# Patient Record
Sex: Female | Born: 1940
Health system: Southern US, Community
[De-identification: ages and names within clinical notes are randomized; demographics above are authoritative.]

## PROBLEM LIST (undated history)

## (undated) DIAGNOSIS — C55 Malignant neoplasm of uterus, part unspecified: Secondary | ICD-10-CM

## (undated) DIAGNOSIS — T7840XA Allergy, unspecified, initial encounter: Secondary | ICD-10-CM

## (undated) DIAGNOSIS — E785 Hyperlipidemia, unspecified: Secondary | ICD-10-CM

## (undated) DIAGNOSIS — I10 Essential (primary) hypertension: Secondary | ICD-10-CM

## (undated) DIAGNOSIS — R011 Cardiac murmur, unspecified: Secondary | ICD-10-CM

## (undated) DIAGNOSIS — I351 Nonrheumatic aortic (valve) insufficiency: Secondary | ICD-10-CM

## (undated) DIAGNOSIS — D126 Benign neoplasm of colon, unspecified: Secondary | ICD-10-CM

## (undated) HISTORY — PX: GLAUCOMA SURGERY: SHX656

## (undated) HISTORY — PX: BLADDER SUSPENSION: SHX72

## (undated) HISTORY — DX: Essential (primary) hypertension: I10

## (undated) HISTORY — PX: COLONOSCOPY: SHX174

## (undated) HISTORY — DX: Allergy, unspecified, initial encounter: T78.40XA

## (undated) HISTORY — DX: Hyperlipidemia, unspecified: E78.5

## (undated) HISTORY — DX: Cardiac murmur, unspecified: R01.1

## (undated) HISTORY — PX: INTRAOCULAR LENS INSERTION: SHX110

## (undated) HISTORY — DX: Malignant neoplasm of uterus, part unspecified: C55

## (undated) HISTORY — PX: CARPAL TUNNEL RELEASE: SHX101

## (undated) HISTORY — DX: Nonrheumatic aortic (valve) insufficiency: I35.1

## (undated) HISTORY — DX: Benign neoplasm of colon, unspecified: D12.6

## (undated) HISTORY — PX: TOTAL ABDOMINAL HYSTERECTOMY W/ BILATERAL SALPINGOOPHORECTOMY: SHX83

---

## 1998-04-08 ENCOUNTER — Other Ambulatory Visit: Admission: RE | Admit: 1998-04-08 | Discharge: 1998-04-08 | Payer: Self-pay | Admitting: Obstetrics & Gynecology

## 1998-11-25 ENCOUNTER — Other Ambulatory Visit: Admission: RE | Admit: 1998-11-25 | Discharge: 1998-11-25 | Payer: Self-pay | Admitting: Obstetrics and Gynecology

## 1999-06-02 ENCOUNTER — Other Ambulatory Visit: Admission: RE | Admit: 1999-06-02 | Discharge: 1999-06-02 | Payer: Self-pay | Admitting: Obstetrics and Gynecology

## 2000-06-06 ENCOUNTER — Other Ambulatory Visit: Admission: RE | Admit: 2000-06-06 | Discharge: 2000-06-06 | Payer: Self-pay | Admitting: Obstetrics and Gynecology

## 2001-07-04 ENCOUNTER — Other Ambulatory Visit: Admission: RE | Admit: 2001-07-04 | Discharge: 2001-07-04 | Payer: Self-pay | Admitting: Obstetrics and Gynecology

## 2002-08-18 ENCOUNTER — Other Ambulatory Visit: Admission: RE | Admit: 2002-08-18 | Discharge: 2002-08-18 | Payer: Self-pay | Admitting: Obstetrics and Gynecology

## 2002-12-11 ENCOUNTER — Ambulatory Visit (HOSPITAL_BASED_OUTPATIENT_CLINIC_OR_DEPARTMENT_OTHER): Admission: RE | Admit: 2002-12-11 | Discharge: 2002-12-11 | Payer: Self-pay | Admitting: Orthopedic Surgery

## 2002-12-11 ENCOUNTER — Ambulatory Visit (HOSPITAL_COMMUNITY): Admission: RE | Admit: 2002-12-11 | Discharge: 2002-12-11 | Payer: Self-pay | Admitting: Orthopedic Surgery

## 2003-02-28 DIAGNOSIS — D126 Benign neoplasm of colon, unspecified: Secondary | ICD-10-CM

## 2003-02-28 HISTORY — DX: Benign neoplasm of colon, unspecified: D12.6

## 2003-08-11 ENCOUNTER — Other Ambulatory Visit: Admission: RE | Admit: 2003-08-11 | Discharge: 2003-08-11 | Payer: Self-pay | Admitting: Obstetrics and Gynecology

## 2004-08-05 ENCOUNTER — Ambulatory Visit: Payer: Self-pay | Admitting: Internal Medicine

## 2004-08-18 ENCOUNTER — Other Ambulatory Visit: Admission: RE | Admit: 2004-08-18 | Discharge: 2004-08-18 | Payer: Self-pay | Admitting: Obstetrics and Gynecology

## 2004-12-23 ENCOUNTER — Ambulatory Visit: Payer: Self-pay | Admitting: Internal Medicine

## 2005-11-06 ENCOUNTER — Ambulatory Visit: Payer: Self-pay | Admitting: Internal Medicine

## 2005-12-27 ENCOUNTER — Ambulatory Visit: Payer: Self-pay | Admitting: Internal Medicine

## 2006-06-05 ENCOUNTER — Ambulatory Visit (HOSPITAL_COMMUNITY): Admission: RE | Admit: 2006-06-05 | Discharge: 2006-06-05 | Payer: Self-pay | Admitting: Obstetrics and Gynecology

## 2006-12-14 ENCOUNTER — Ambulatory Visit: Payer: Self-pay | Admitting: Internal Medicine

## 2006-12-14 DIAGNOSIS — I10 Essential (primary) hypertension: Secondary | ICD-10-CM

## 2006-12-14 DIAGNOSIS — J301 Allergic rhinitis due to pollen: Secondary | ICD-10-CM

## 2006-12-14 DIAGNOSIS — N3946 Mixed incontinence: Secondary | ICD-10-CM | POA: Insufficient documentation

## 2006-12-14 DIAGNOSIS — E782 Mixed hyperlipidemia: Secondary | ICD-10-CM | POA: Insufficient documentation

## 2006-12-25 ENCOUNTER — Encounter (INDEPENDENT_AMBULATORY_CARE_PROVIDER_SITE_OTHER): Payer: Self-pay | Admitting: *Deleted

## 2007-03-21 ENCOUNTER — Encounter: Payer: Self-pay | Admitting: Internal Medicine

## 2007-11-25 ENCOUNTER — Telehealth (INDEPENDENT_AMBULATORY_CARE_PROVIDER_SITE_OTHER): Payer: Self-pay | Admitting: *Deleted

## 2007-12-04 ENCOUNTER — Telehealth (INDEPENDENT_AMBULATORY_CARE_PROVIDER_SITE_OTHER): Payer: Self-pay | Admitting: *Deleted

## 2007-12-31 ENCOUNTER — Ambulatory Visit: Payer: Self-pay | Admitting: Internal Medicine

## 2007-12-31 LAB — CONVERTED CEMR LAB
AST: 27 units/L (ref 0–37)
Albumin: 3.9 g/dL (ref 3.5–5.2)
Alkaline Phosphatase: 52 units/L (ref 39–117)
Cholesterol: 150 mg/dL (ref 0–200)
Total Protein: 6.9 g/dL (ref 6.0–8.3)
Triglycerides: 71 mg/dL (ref 0–149)

## 2008-01-07 ENCOUNTER — Ambulatory Visit: Payer: Self-pay | Admitting: Internal Medicine

## 2008-01-07 DIAGNOSIS — Z860100 Personal history of colon polyps, unspecified: Secondary | ICD-10-CM | POA: Insufficient documentation

## 2008-01-07 DIAGNOSIS — R9431 Abnormal electrocardiogram [ECG] [EKG]: Secondary | ICD-10-CM

## 2008-01-07 DIAGNOSIS — R7303 Prediabetes: Secondary | ICD-10-CM | POA: Insufficient documentation

## 2008-03-24 ENCOUNTER — Encounter: Payer: Self-pay | Admitting: Internal Medicine

## 2008-05-25 ENCOUNTER — Ambulatory Visit: Payer: Self-pay | Admitting: Internal Medicine

## 2008-09-03 ENCOUNTER — Encounter (INDEPENDENT_AMBULATORY_CARE_PROVIDER_SITE_OTHER): Payer: Self-pay | Admitting: *Deleted

## 2008-11-06 ENCOUNTER — Telehealth (INDEPENDENT_AMBULATORY_CARE_PROVIDER_SITE_OTHER): Payer: Self-pay | Admitting: *Deleted

## 2008-12-16 ENCOUNTER — Ambulatory Visit: Payer: Self-pay | Admitting: Internal Medicine

## 2009-01-01 ENCOUNTER — Encounter (INDEPENDENT_AMBULATORY_CARE_PROVIDER_SITE_OTHER): Payer: Self-pay | Admitting: *Deleted

## 2009-01-19 ENCOUNTER — Encounter (INDEPENDENT_AMBULATORY_CARE_PROVIDER_SITE_OTHER): Payer: Self-pay

## 2009-01-25 ENCOUNTER — Ambulatory Visit: Payer: Self-pay | Admitting: Internal Medicine

## 2009-02-08 ENCOUNTER — Ambulatory Visit: Payer: Self-pay | Admitting: Internal Medicine

## 2009-02-08 LAB — HM COLONOSCOPY

## 2009-03-25 ENCOUNTER — Encounter: Payer: Self-pay | Admitting: Internal Medicine

## 2009-03-26 ENCOUNTER — Telehealth (INDEPENDENT_AMBULATORY_CARE_PROVIDER_SITE_OTHER): Payer: Self-pay | Admitting: *Deleted

## 2009-04-12 ENCOUNTER — Ambulatory Visit: Payer: Self-pay | Admitting: Internal Medicine

## 2009-04-19 LAB — CONVERTED CEMR LAB
ALT: 35 units/L (ref 0–35)
AST: 32 units/L (ref 0–37)
Albumin: 3.9 g/dL (ref 3.5–5.2)
Basophils Absolute: 0 10*3/uL (ref 0.0–0.1)
CO2: 30 meq/L (ref 19–32)
Calcium: 9.2 mg/dL (ref 8.4–10.5)
Eosinophils Absolute: 0.1 10*3/uL (ref 0.0–0.7)
GFR calc non Af Amer: 105.07 mL/min (ref >60)
HCT: 39.4 % (ref 36.0–46.0)
HDL: 46.3 mg/dL (ref >39.00)
Leukocytes, UA: NEGATIVE
Lymphs Abs: 2.1 10*3/uL (ref 0.7–4.0)
MCHC: 32.8 g/dL (ref 30.0–36.0)
Monocytes Relative: 10.4 % (ref 3.0–12.0)
Nitrite: NEGATIVE
Platelets: 171 10*3/uL (ref 150.0–400.0)
RDW: 12.9 % (ref 11.5–14.6)
Sodium: 142 meq/L (ref 135–145)
Specific Gravity, Urine: 1.025 (ref 1.000–1.030)
TSH: 4.67 microintl units/mL (ref 0.35–5.50)
Total Bilirubin: 0.5 mg/dL (ref 0.3–1.2)
Triglycerides: 143 mg/dL (ref 0.0–149.0)
Urobilinogen, UA: 0.2 (ref 0.0–1.0)

## 2009-04-20 ENCOUNTER — Ambulatory Visit: Payer: Self-pay | Admitting: Internal Medicine

## 2009-04-20 ENCOUNTER — Telehealth (INDEPENDENT_AMBULATORY_CARE_PROVIDER_SITE_OTHER): Payer: Self-pay | Admitting: *Deleted

## 2009-04-20 DIAGNOSIS — Z8679 Personal history of other diseases of the circulatory system: Secondary | ICD-10-CM | POA: Insufficient documentation

## 2009-04-20 DIAGNOSIS — K219 Gastro-esophageal reflux disease without esophagitis: Secondary | ICD-10-CM

## 2009-08-16 ENCOUNTER — Ambulatory Visit: Payer: Self-pay | Admitting: Internal Medicine

## 2009-08-16 LAB — CONVERTED CEMR LAB
ALT: 33 units/L (ref 0–35)
AST: 29 units/L (ref 0–37)
Albumin: 4.2 g/dL (ref 3.5–5.2)
Alkaline Phosphatase: 43 units/L (ref 39–117)
Cholesterol: 172 mg/dL (ref 0–200)

## 2009-08-20 ENCOUNTER — Ambulatory Visit: Payer: Self-pay | Admitting: Internal Medicine

## 2009-12-15 ENCOUNTER — Ambulatory Visit: Payer: Self-pay | Admitting: Internal Medicine

## 2010-01-19 ENCOUNTER — Ambulatory Visit: Payer: Self-pay | Admitting: Internal Medicine

## 2010-01-24 LAB — CONVERTED CEMR LAB
Albumin: 4.1 g/dL (ref 3.5–5.2)
Alkaline Phosphatase: 45 units/L (ref 39–117)
BUN: 16 mg/dL (ref 6–23)
Bilirubin, Direct: 0.1 mg/dL (ref 0.0–0.3)
Creatinine, Ser: 0.6 mg/dL (ref 0.4–1.2)
HDL: 43.2 mg/dL (ref >39.00)
Hgb A1c MFr Bld: 5.8 % (ref 4.6–6.5)
Potassium: 4 meq/L (ref 3.5–5.1)
Total CHOL/HDL Ratio: 4
Triglycerides: 137 mg/dL (ref 0.0–149.0)
VLDL: 27.4 mg/dL (ref 0.0–40.0)

## 2010-01-26 ENCOUNTER — Ambulatory Visit: Payer: Self-pay | Admitting: Internal Medicine

## 2010-03-27 LAB — CONVERTED CEMR LAB
BUN: 11 mg/dL (ref 6–23)
Creatinine, Ser: 0.6 mg/dL (ref 0.4–1.2)
HDL goal, serum: 40 mg/dL
HDL goal, serum: 50 mg/dL
HDL: 47.1 mg/dL (ref >39.0)
LDL Goal: 110 mg/dL
LDL Goal: 130 mg/dL
Triglycerides: 84 mg/dL (ref 0–149)

## 2010-03-28 ENCOUNTER — Encounter: Payer: Self-pay | Admitting: Internal Medicine

## 2010-03-29 NOTE — Assessment & Plan Note (Signed)
Summary: MR5//PH   Vital Signs:  Patient profile:   70 year old female Weight:      130 pounds Temp:     98.2 degrees F oral Pulse rate:   72 / minute Resp:     16 per minute BP sitting:   160 / 90  (left arm)  Vitals Entered By: Jeremy Johann CMA (April 20, 2009 12:58 PM) CC: yearly check, Hypertension Management Comments REVIEWED MED LIST, PATIENT AGREED DOSE AND INSTRUCTION CORRECT    CC:  yearly check and Hypertension Management.  History of Present Illness: Third L finger gets white & numb intermittently , not necessarily related to cold exposure for ? 3 months.   Lesser symptoms L thumb on one occasion.No associated cardiovascular or Neuro triggers . BP @ home 149/64 . CVE as treadmill 30 min 3-4X/week w/o symptoms. No diet but decreased fried , greasy foods.  Hypertension History:      She complains of neurologic problems, but denies headache, chest pain, palpitations, dyspnea with exertion, orthopnea, PND, peripheral edema, visual symptoms, syncope, and side effects from treatment.  She notes no problems with any antihypertensive medication side effects.        Positive major cardiovascular risk factors include female age 72 years old or older, hyperlipidemia, and hypertension.  Negative major cardiovascular risk factors include no history of diabetes, negative family history for ischemic heart disease, and non-tobacco-user status.        Further assessment for target organ damage reveals no history of ASHD, stroke/TIA, or peripheral vascular disease.     Allergies: 1)  ! * Lotrel  Past History:  Past Medical History: Hyperlipidemia Hypertension Allergic rhinitis Hyperglycemia, fasting Colonic polyps, hx of GERD  Past Surgical History: Hysterectomy & BSO for CA in uterus 1995 Colon polypectomy 2005, negative 2010,Dr Gessner, due 2015 CTS surgery LUE; Sling surgery for cough incontinence  2008  Family History: Father: CAD,MI @ 70, CA lung Mother: CHF,  osteoporosis Siblings: sister RA; no FH Raynaud's Disease  Social History: Retired Married Never Smoked Alcohol use-yes:rarely Regular exercise-yes  Review of Systems Eyes:  Denies blurring, double vision, and vision loss-both eyes. ENT:  Denies hoarseness; Occa food dysphagia. CV:  Denies bluish discoloration of lips or nails, leg cramps with exertion, lightheadness, and near fainting. Resp:  Denies cough and sputum productive. GI:  Complains of indigestion; denies abdominal pain and bloody stools; Negative colonoscopy 12/ 2010. Ranitidine helps ERD. GU:  Denies discharge, dysuria, and hematuria. MS:  Denies joint pain, low back pain, mid back pain, and thoracic pain. Derm:  See HPI; Complains of changes in color of skin; denies lesion(s), poor wound healing, and rash. Neuro:  Denies brief paralysis, tingling, and weakness. Psych:  Denies anxiety and depression. Endo:  See HPI; Complains of cold intolerance; denies excessive hunger, excessive thirst, excessive urination, and heat intolerance. Heme:  Denies abnormal bruising and bleeding. Allergy:  Complains of itching eyes and sneezing; Rx: Zyrtec as needed .  Physical Exam  General:  well-nourished ;alert,appropriate and cooperative throughout examination Neck:  No deformities, masses, or tenderness noted. Lungs:  Normal respiratory effort, chest expands symmetrically. Lungs are clear to auscultation, no crackles or wheezes. Heart:  normal rate, regular rhythm, no gallop, no rub, no JVD, no HJR, and grade 1-1.5  /6 systolic murmur @ base with neck radiation.   Abdomen:  Bowel sounds positive,abdomen soft and non-tender without masses, organomegaly or hernias noted. No bruits or AAA Genitalia:  Dr Senaida Ores Msk:  No deformity  or scoliosis noted of thoracic or lumbar spine.   Pulses:  R and L carotid,radial,dorsalis pedis and posterior tibial pulses are full and equal bilaterally Extremities:  No clubbing, cyanosis, edema  noted  with normal full range of motion of all joints.   OA hand changes Neurologic:  alert & oriented X3, strength normal in all extremities, and DTRs symmetrical and normal.   Skin:  Intact without suspicious lesions or rashes Cervical Nodes:  No lymphadenopathy noted Axillary Nodes:  No palpable lymphadenopathy Psych:  memory intact for recent and remote, normally interactive, and good eye contact.     Impression & Recommendations:  Problem # 1:  HYPERTENSION, ESSENTIAL NOS (ICD-401.9)  Her updated medication list for this problem includes:    Toprol Xl 50 Mg Tb24 (Metoprolol succinate) .Marland Kitchen... 1 by mouth qd    Clonidine Hcl 0.1 Mg Tabs (Clonidine hcl) .Marland Kitchen... 1 two times a day  Orders: EKG w/ Interpretation (93000)  Problem # 2:  HYPERLIPIDEMIA (ICD-272.2)  The following medications were removed from the medication list:    Vytorin 10-20 Mg Tabs (Ezetimibe-simvastatin) .Marland Kitchen... 1 qhs Her updated medication list for this problem includes:    Pravastatin Sodium 40 Mg Tabs (Pravastatin sodium) .Marland Kitchen... 1 at bedtime  Problem # 3:  RAYNAUD'S SYNDROME, HX OF (ICD-V12.59)  Problem # 4:  GERD (ICD-530.81)  Problem # 5:  NONSPECIFIC ABNORMAL ELECTROCARDIOGRAM (ICD-794.31)  stable vs 01/07/2008; neg  nuclear stress test 2004  Orders: EKG w/ Interpretation (93000)  Problem # 6:  HYPERGLYCEMIA, FASTING (ICD-790.29) A1c  5.9%  Complete Medication List: 1)  Toprol Xl 50 Mg Tb24 (Metoprolol succinate) .Marland Kitchen.. 1 by mouth qd 2)  Vagifem 25 Mcg Tabs (Estradiol) .... Twice weekly 3)  Zyrtec Allergy 10 Mg Tabs (Cetirizine hcl) .Marland Kitchen.. 1 by mouth qd 4)  Estroven  5)  Calcium and Vit D 1 Po Bid  6)  Vit B6 200mg  Qd  7)  Fish Oil 1gram Qd  8)  Multivitamin  9)  Vit E 400 Iu Qd  10)  Glucosamine and Chondroitin 3 Tabs Qd  11)  Clonidine Hcl 0.1 Mg Tabs (Clonidine hcl) .Marland Kitchen.. 1 two times a day 12)  Pravastatin Sodium 40 Mg Tabs (Pravastatin sodium) .Marland Kitchen.. 1 at bedtime 13)  Fluticasone Propionate 50 Mcg/act Susp  (Fluticasone propionate) .Marland Kitchen.. 1 spray two times a day  Other Orders: Tdap => 92yrs IM (04540) Admin 1st Vaccine (98119) Admin 1st Vaccine [facility]) [phone])  Hypertension Assessment/Plan:      The patient's hypertensive risk group is category B: At least one risk factor (excluding diabetes) with no target organ damage.  Her calculated 10 year risk of coronary heart disease is 13 %.  Today's blood pressure is 160/90.    Patient Instructions: 1)  Check your Blood Pressure regularly. If it is above:140/90 ON AVERAGE  you should  increase Clonidine to 0.1 mg 2 pills two times a day .Observe as to triggers for Raynaud's . 2)  Please schedule a follow-up appointment in 3 months. 3)  Hepatic Panel prior to visit, ICD-9:995.20 4)  Lipid Panel prior to visit, ICD-9:272.4 Prescriptions: FLUTICASONE PROPIONATE 50 MCG/ACT SUSP (FLUTICASONE PROPIONATE) 1 spray two times a day  #3 x 3   Entered and Authorized by:   Marga Melnick MD   Signed by:   Marga Melnick MD on 04/20/2009   Method used:   Print then Give to Patient   RxID:   [id number] CLONIDINE HCL 0.1 MG TABS (CLONIDINE HCL) 1 two times a  day  #3 x 3   Entered and Authorized by:   Marga Melnick MD   Signed by:   Marga Melnick MD on 04/20/2009   Method used:   Print then Give to Patient   RxID:   [id number] TOPROL XL 50 MG TB24 (METOPROLOL SUCCINATE) 1 by mouth qd  #90 x 3   Entered and Authorized by:   Marga Melnick MD   Signed by:   Marga Melnick MD on 04/20/2009   Method used:   Print then Give to Patient   RxID:   [id number] PRAVASTATIN SODIUM 40 MG TABS (PRAVASTATIN SODIUM) 1 at bedtime  #90 x 0   Entered and Authorized by:   Marga Melnick MD   Signed by:   Marga Melnick MD on 04/20/2009   Method used:   Print then Give to Patient   RxID:   [id number]    Tetanus/Td Vaccine    Vaccine Type: Tdap    Site: right deltoid    Mfr: [employer]oSmithK[employer]    Dose: 0.5 ml    Route: IM    Given by: Jeremy Johann CMA    Exp. Date: 04/24/2011    Lot #: [id number]    VIS given: 01/15/07 version given April 20, 2009.

## 2010-03-29 NOTE — Progress Notes (Signed)
Summary: RX Concerns  Phone Note Call from Patient Call back at Home Phone [phone]   Caller: Patient Summary of Call: Patient reviewed rx's and the clodinine rx was incorrect-dispense number.  I changed to number 60 with 11 refills and then Dr.Hopper advised to just give a 3 month supply @ a time. Patient was given the correct rx and other rx's were shredded. Initial call taken by: Shonna Chock,  April 20, 2009 1:55 PM    Prescriptions: CLONIDINE HCL 0.1 MG TABS (CLONIDINE HCL) 1 two times a day  #180 x 3   Entered by:   Shonna Chock   Authorized by:   Marga Melnick MD   Signed by:   Shonna Chock on 04/20/2009   Method used:   Print then Give to Patient   RxID:   [id number] CLONIDINE HCL 0.1 MG TABS (CLONIDINE HCL) 1 two times a day  #60 x 11   Entered by:   Shonna Chock   Authorized by:   Marga Melnick MD   Signed by:   Shonna Chock on 04/20/2009   Method used:   Print then Give to Patient   RxID:   [id number]

## 2010-03-29 NOTE — Assessment & Plan Note (Signed)
Summary: 3 month roa//lch   Vital Signs:  Patient profile:   70 year old female Weight:      130.2 pounds BMI:     23.15 Pulse rate:   72 / minute Resp:     15 per minute BP sitting:   156 / 84  (left arm) Cuff size:   regular  Vitals Entered By: Shonna Chock (August 20, 2009 10:09 AM) CC: 3 Month follow-up (copy of labs given) Comments REVIEWED MED LIST, PATIENT AGREED DOSE AND INSTRUCTION CORRECT    CC:  3 Month follow-up (copy of labs given).  History of Present Illness: Labs reviewed & risks discussed. Hyperlipidemia Follow-Up      This is a 70 year old woman who presents for Hyperlipidemia follow-up.  The patient denies muscle aches, GI upset, abdominal pain, flushing, itching, constipation, diarrhea, and fatigue.  The patient denies the following symptoms: chest pain/pressure, exercise intolerance, dypsnea, palpitations, syncope, and pedal edema.  Compliance with medications (by patient report) has been near 100%.  Dietary compliance has been good  to fair.  The patient reports exercising occasionally.  Adjunctive measures currently used by the patient include fish oil supplements.    Hypertension Follow-Up      The patient also presents for Hypertension follow-up.  The patient reports urinary frequency, but denies lightheadedness, headaches, edema, and rash.  The patient denies the following associated symptoms: syncope.  Compliance with medications (by patient report) has been near 100%.  Adjunctive measures currently used by the patient include salt restriction "some".   BP @ home 140/62.  Allergies: 1)  ! * Lotrel  Review of Systems ENT:  Denies nosebleeds. CV:  Denies leg cramps with exertion.  Physical Exam  General:  Appears younger than age,well-nourished,in no acute distress; alert,appropriate and cooperative throughout examination Eyes:  No corneal or conjunctival inflammation noted.Perrla. Funduscopic exam benign, without hemorrhages, exudates or papilledema.    Lungs:  Normal respiratory effort, chest expands symmetrically. Lungs are clear to auscultation, no crackles or wheezes. Heart:  normal rate, regular rhythm, no gallop, no rub @ base.   Abdomen:  Bowel sounds positive,abdomen soft and non-tender without masses, organomegaly or hernias noted. No AAA or bruits Pulses:  R and L carotid,radial,dorsalis pedis and posterior tibial pulses are full and equal bilaterally Extremities:  No clubbing, cyanosis, edema. Skin:  Intact without suspicious lesions or rashes Psych:  memory intact for recent and remote, normally interactive, and good eye contact.     Impression & Recommendations:  Problem # 1:  HYPERLIPIDEMIA (ICD-272.2) TG not @ goal  Her updated medication list for this problem includes:    Pravastatin Sodium 40 Mg Tabs (Pravastatin sodium) .Marland Kitchen... 1 at bedtime  Problem # 2:  HYPERTENSION, ESSENTIAL NOS (ICD-401.9)  The following medications were removed from the medication list:    Toprol Xl 50 Mg Tb24 (Metoprolol succinate) .Marland Kitchen... 1 by mouth qd    Clonidine Hcl 0.1 Mg Tabs (Clonidine hcl) .Marland Kitchen... 1 two times a day Her updated medication list for this problem includes:    Carvedilol 6.25 Mg Tabs (Carvedilol) .Marland Kitchen... 1 two times a day  Problem # 3:  HYPERGLYCEMIA, FASTING (ICD-790.29)  Complete Medication List: 1)  Vagifem 10 Mcg Tabs (Estradiol) .... 2-3 x weekly 2)  Zyrtec Allergy 10 Mg Tabs (Cetirizine hcl) .Marland Kitchen.. 1 by mouth qd 3)  Estroven  4)  Calcium and Vit D 1 Po Bid  5)  Vit B6 200mg  Qd  6)  Fish Oil 1gram Qd  7)  Multivitamin  8)  Vit E 400 Iu Qd  9)  Glucosamine and Chondroitin 3 Tabs Qd  10)  Pravastatin Sodium 40 Mg Tabs (Pravastatin sodium) .Marland Kitchen.. 1 at bedtime 11)  Fluticasone Propionate 50 Mcg/act Susp (Fluticasone propionate) .Marland Kitchen.. 1 spray two times a day 12)  Allegra 180 Mg Tabs (Fexofenadine hcl) .... Otc- alternates with zyrtec 13)  Carvedilol 6.25 Mg Tabs (Carvedilol) .Marland Kitchen.. 1 two times a day  Patient Instructions: 1)   Consume < 30 grams of HFCS  "sugar"/ day as discussed. 2)  Please schedule a follow-up appointment in 4 months. 3)  BUN,creat,K+ prior to visit, ICD-9:401.9 4)  Hepatic Panel prior to visit, ICD-9:995.20 5)  Lipid Panel prior to visit, ICD-9:272.4 6)  HbgA1C prior to visit, ICD-9:790.29. 7)  Check your Blood Pressure regularly. If it is above:  135/85 ON AVERAGE  on new medication,you should call. 8)  Limit your Sodium (Salt) to less than 4 grams a day (slightly less than 1 teaspoon) to prevent fluid retention, swelling, or worsening or symptoms. Prescriptions: PRAVASTATIN SODIUM 40 MG TABS (PRAVASTATIN SODIUM) 1 at bedtime  #90 x 1   Entered and Authorized by:   Marga Melnick MD   Signed by:   Marga Melnick MD on 08/20/2009   Method used:   Print then Give to Patient   RxID:   [id number] CARVEDILOL 6.25 MG TABS (CARVEDILOL) 1 two times a day  #60 x 5   Entered and Authorized by:   Marga Melnick MD   Signed by:   Marga Melnick MD on 08/20/2009   Method used:   Print then Give to Patient   RxID:   [id number]

## 2010-03-29 NOTE — Assessment & Plan Note (Signed)
Summary: rto 3 months-review lab/cbs   Vital Signs:  Patient profile:   70 year old female Weight:      127 pounds BMI:     22.58 Pulse rate:   76 / minute Resp:     15 per minute BP sitting:   146 / 88  (left arm) Cuff size:   regular  Vitals Entered By: Shonna Chock CMA (January 26, 2010 10:10 AM) CC: Follow-up visit: discuss labs ( patient with mailed copy) , Lipid Management   CC:  Follow-up visit: discuss labs ( patient with mailed copy)  and Lipid Management.  History of Present Illness: Hyperlipidemia Follow-Up      This is a 70 year old woman who presents for Hyperlipidemia follow-up.  The patient denies muscle aches, GI upset, abdominal pain, flushing, itching, constipation, diarrhea, and fatigue.  The patient denies the following symptoms: chest pain/pressure, exercise intolerance, dypsnea, and palpitations.  Compliance with medications (by patient report) has been near 100%.  Dietary compliance has been good.  The patient reports exercising occasionally.  Adjunctive measures currently used by the patient include fish oil supplements.   Lipids reviewed ; significant imprvement in TG ( 207  to 137). Hypertension Follow-Up      The patient also presents for Hypertension follow-up.  The patient denies lightheadedness, urinary frequency, headaches, edema, impotence, rash, and fatigue.  Adjunctive measures currently used by the patient include salt restriction.   BP averages 138/66 @ home.  Lipid Management History:      Positive NCEP/ATP III risk factors include female age 71 years old or older and hypertension.  Negative NCEP/ATP III risk factors include no history of early menopause without estrogen hormone replacement, non-diabetic, no family history for ischemic heart disease, non-tobacco-user status, no ASHD (atherosclerotic heart disease), no prior stroke/TIA, no peripheral vascular disease, and no history of aortic aneurysm.     Current Medications (verified): 1)  Vagifem  10 Mcg Tabs (Estradiol) .... 2-3 X Weekly 2)  Zyrtec Allergy 10 Mg  Tabs (Cetirizine Hcl) .Marland Kitchen.. 1 By Mouth Qd 3)  Estroven 4)  Calcium and Vit D 1 Po Bid 5)  Vit B6 200mg  Qd 6)  Fish Oil 1gram Qd 7)  Multivitamin 8)  Vit E 400 Iu Qd 9)  Glucosamine and Chondroitin 3 Tabs Qd 10)  Pravastatin Sodium 40 Mg Tabs (Pravastatin Sodium) .Marland Kitchen.. 1 At Bedtime 11)  Fluticasone Propionate 50 Mcg/act Susp (Fluticasone Propionate) .Marland Kitchen.. 1 Spray Two Times A Day 12)  Allegra 180 Mg Tabs (Fexofenadine Hcl) .... Otc- Alternates With Zyrtec 13)  Carvedilol 6.25 Mg Tabs (Carvedilol) .Marland Kitchen.. 1 Two Times A Day 14)  Chlorpheniramine Maleate 12 Mg Cr-Tabs (Chlorpheniramine Maleate) .Marland Kitchen.. 1 By Mouth As Needed  Allergies: 1)  ! * Lotrel  Past History:  Past Medical History: Hyperlipidemia : [place]ham Study  LDL goal = < 130. Hypertension Allergic rhinitis Hyperglycemia, fasting Colonic polyps, hx of GERD  Physical Exam  General:  well-nourished, appears younger than age ;alert,appropriate and cooperative throughout examination Lungs:  Normal respiratory effort, chest expands symmetrically. Lungs are clear to auscultation, no crackles or wheezes. Heart:  normal rate, regular rhythm, no gallop, no rub, no JVD, no HJR, and grade 1 /6 systolic murmur with neck radiation.   Pulses:  R and L carotid,radial,dorsalis pedis and posterior tibial pulses are full and equal bilaterally Extremities:  No clubbing, cyanosis, edema. Neurologic:  alert & oriented X3.   Psych:  memory intact for recent and remote, normally interactive, and good  eye contact.   Focused & motivated   Impression & Recommendations:  Problem # 1:  HYPERLIPIDEMIA (ICD-272.2) Lipids @ goal Her updated medication list for this problem includes:    Pravastatin Sodium 40 Mg Tabs (Pravastatin sodium) .Marland Kitchen... 1 at bedtime  Problem # 2:  HYPERTENSION, ESSENTIAL NOS (ICD-401.9) mild elevation  Complete Medication List: 1)  Vagifem 10 Mcg Tabs  (Estradiol) .... 2-3 x weekly 2)  Zyrtec Allergy 10 Mg Tabs (Cetirizine hcl) .Marland Kitchen.. 1 by mouth qd 3)  Estroven  4)  Calcium and Vit D 1 Po Bid  5)  Vit B6 200mg  Qd  6)  Fish Oil 1gram Qd  7)  Multivitamin  8)  Vit E 400 Iu Qd  9)  Glucosamine and Chondroitin 3 Tabs Qd  10)  Pravastatin Sodium 40 Mg Tabs (Pravastatin sodium) .Marland Kitchen.. 1 at bedtime 11)  Fluticasone Propionate 50 Mcg/act Susp (Fluticasone propionate) .Marland Kitchen.. 1 spray two times a day 12)  Allegra 180 Mg Tabs (Fexofenadine hcl) .... Otc- alternates with zyrtec 13)  Carvedilol 12.5 Mg Tabs (carvedilol)  .Marland Kitchen.. 1 two times a day 14)  Chlorpheniramine Maleate 12 Mg Cr-tabs (Chlorpheniramine maleate) .Marland Kitchen.. 1 by mouth as needed  Lipid Assessment/Plan:      Based on NCEP/ATP III, the patient's risk factor category is "2 or more risk factors and a calculated 10 year CAD risk of < 20%".  The patient's lipid goals are as follows: Total cholesterol goal is 200; LDL cholesterol goal is 110; HDL cholesterol goal is 50; Triglyceride goal is 150.  Her LDL cholesterol goal has been met.    Patient Instructions: 1)  Increase Carvedilol as Rxed. 2)  Check your Blood Pressure regularly. If it is above: 135/ 85 ON AVERAGE  you should make an appointment. Prescriptions: PRAVASTATIN SODIUM 40 MG TABS (PRAVASTATIN SODIUM) 1 at bedtime  #90 x 3   Entered and Authorized by:   Marga Melnick MD   Signed by:   Marga Melnick MD on 01/26/2010   Method used:   Print then Give to Patient   RxID:   [id number] CARVEDILOL 12.5 MG TABS (CARVEDILOL) 1 two times a day  #180 x 1   Entered and Authorized by:   Marga Melnick MD   Signed by:   Marga Melnick MD on 01/26/2010   Method used:   Print then Give to Patient   RxID:   [id number]    Orders Added: 1)  Est. Patient Level III [84696]

## 2010-03-29 NOTE — Assessment & Plan Note (Signed)
Summary: FLU SHOT///SP[facility]  Nurse Visit  CC: Flu shot./kb   Allergies: 1)  ! * Lotrel  Orders Added: 1)  Flu Vaccine 65yrs + MEDICARE PATIENTS [Q2039] 2)  Administration Flu vaccine - MCR [G0008]             Flu Vaccine Consent Questions     Do you have a history of severe allergic reactions to this vaccine? no    Any prior history of allergic reactions to egg and/or gelatin? no    Do you have a sensitivity to the preservative Thimersol? no    Do you have a past history of Guillan-Barre Syndrome? no    Do you currently have an acute febrile illness? no    Have you ever had a severe reaction to latex? no    Vaccine information given and explained to patient? yes    Are you currently pregnant? no    Lot Number:[id number]   Exp Date:08/27/2010   Site Given  Right Deltoid IMu

## 2010-03-29 NOTE — Progress Notes (Signed)
Summary: LAB ORDERS  Phone Note Call from Patient Call back at [facility] Phone [phone]   Caller: Patient Summary of Call: PT CALL AND I MADE AN APPT FOR [facility] ON 2/14 AT 8:50 AM AND SHE HAS AN APPT  IN FEBRUARY FOR A MR5. JUST NEED ORDERS FOR THE LABS TO BE PUT IN. Initial call taken by: Freddy Jaksch,  March 26, 2009 3:42 PM  Follow-up for Phone Call        Lipid,Hepatic,TSH,Udip,Stool Cards,CBCD, BMP 401.9/272.2/992.50 Follow-up by: Shonna Chock,  March 29, 2009 5:25 PM

## 2010-04-14 NOTE — Letter (Signed)
Summary: [facility]   Imported By: Kassie Mends 04/07/2010 11:45:48  _____________________________________________________________________  External Attachment:    Type:   Image     Comment:   External Document

## 2010-07-15 NOTE — H&P (Signed)
NAMEAISHA, [name]            ACCOUNT NO.:  [id number]   MEDICAL RECORD NO.:  [id number]          PATIENT TYPE:  AMB   LOCATION:  SDC                           FACILITY:  [facility]   PHYSICIAN:  Zenaida Niece, M.D.DATE OF BIRTH:  06-27-1940   DATE OF ADMISSION:  06/05/2006  DATE OF DISCHARGE:                              HISTORY & PHYSICAL   CHIEF COMPLAINT:  Stress urinary incontinence.   HISTORY OF PRESENT ILLNESS:  This is a 70 year old female para 2-0-1-2  who sees Dr. Senaida Ores for her GYN care.  She last saw her for a full  exam in June of 2007.  She has complained of leaking urine with walking  and straining.  She had urodynamics in March which reveals stress  urinary incontinence.  She had minimal postvoid residual.  Her leak  point pressures were between 60 and 100 cm of water.  Her maximum  urethral closure pressures were also equivocal at 20 to 30 cm of water,  but she did leak.  After extensive conversations with both Dr.  Senaida Ores and myself, she wishes to proceed with TVT SECUR for stress  incontinence and is admitted for this at this time.   PAST OB HISTORY:  Two vaginal delivers and one spontaneous abortion.   PAST MEDICAL HISTORY:  1. Carpal tunnel syndrome.  2. Hypercholesterolemia.   PAST SURGICAL HISTORY:  1. D and C followed by [facility] for adenocarcinoma in situ of the      endometrium.  2. Surgery for carpal tunnel syndrome.   ALLERGIES:  None known.   CURRENT MEDICATIONS:  Vytorin, Toprol, calcium.   FAMILY HISTORY:  No GYN or colon cancer.   SOCIAL HISTORY:  She is married.  She denies alcohol, tobacco or drug  use.   REVIEW OF SYSTEMS:  Otherwise normal.   PHYSICAL EXAMINATION:  VITAL SIGNS:  Weight approximately 130 pounds,  blood pressure normal.  GENERAL APPEARANCE:  This is a well-developed female in no acute  distress.  NECK:  Supple without lymphadenopathy or thyromegaly.  CARDIOVASCULAR:  Heart is regular rate and rhythm without  murmur.  CHEST:  Lungs are clear to auscultation.  ABDOMEN:  Soft, nontender, nondistended without palpable masses.  EXTREMITIES:  No edema and are nontender.  PELVIC:  External genitalia has no lesions.  On speculum exam, the  vagina is normal with well-healed and supported vaginal cuff without  significant cystocele.  Bimanual exam reveals no masses.   ASSESSMENT:  Stress urinary incontinence.  The procedure and all risks  have been discussed with the patient at length and all of her questions  have been answered.  The possibility of doing a retropubic TVT instead  of a TVT SECUR has been discussed in case the TVT SECUR is unavailable.   PLAN:  Admit the patient on the day of surgery for a TVT SECUR.      Zenaida Niece, M.D.  Electronically Signed     TDM/MEDQ  D:  06/04/2006  T:  06/04/2006  Job:  [id number]

## 2010-07-15 NOTE — Op Note (Signed)
   [name], [name]                        ACCOUNT NO.:  [id number]   MEDICAL RECORD NO.:  [id number]                   PATIENT TYPE:  AMB   LOCATION:  DSC                                  FACILITY:  [facility]   PHYSICIAN:  Cindee Salt, M.D.                    DATE OF BIRTH:  08/21/1940   DATE OF PROCEDURE:  12/11/2002  DATE OF DISCHARGE:                                 OPERATIVE REPORT   PREOPERATIVE DIAGNOSIS:  Carpal tunnel syndrome of the left hand.   POSTOPERATIVE DIAGNOSIS:  Carpal tunnel syndrome of the left hand.   OPERATION PERFORMED:  Decompression of left median nerve.   SURGEON:  Cindee Salt, M.D.   ASSISTANTCarolyne Fiscal.   ANESTHESIA:  Forearm based IV regional.   INDICATIONS FOR PROCEDURE:  The patient is a 70 year old female with a  history of carpal tunnel syndrome, EMG nerve conduction positive which not  responded to conservative treatment.   DESCRIPTION OF PROCEDURE:  The patient was brought to the operating room  where a forearm based IV regional anesthetic was carried out without  difficulty.  She was prepped and draped using DuraPrep in supine position,  left arm free.  Longitudinal incision was made in the palm and carried down  through subcutaneous tissue.  Bleeders were electrocauterized.  The palmar  fascia was split.  The superficial palmar arch was identified.  The flexor  tendon to the ring and little finger were identified to the ulnar side of  the median nerve.  The carpal retinaculum was incised with sharp dissection.  A right angle and Sewell retractor were placed between skin and forearm  fascia.  The fascia was released for approximately 3 cm proximal to the  wrist crease under direct vision.  Canal was explored and was found to be  tight.  The median nerve was noted to have hyperemic area. Tenosynovial  tissue was moderately thickened.  No other abnormality was noted.  The wound  was irrigated.  Skin was closed with interrupted 5-0 nylon  sutures.  Sterile  compressive dressing and splint was applied.  The patient tolerated the  procedure well and was taken to the recovery room for observation in  satisfactory condition.  She was discharged to home to return to the [facility] in one week on Vicodin and Keflex.                                                Cindee Salt, M.D.    GK/MEDQ  D:  12/11/2002  T:  12/11/2002  Job:  [id number]

## 2010-07-15 NOTE — Op Note (Signed)
NAMESTEFANIE, [name]            ACCOUNT NO.:  [id number]   MEDICAL RECORD NO.:  [id number]          PATIENT TYPE:  AMB   LOCATION:  SDC                           FACILITY:  [facility]   PHYSICIAN:  Zenaida Niece, M.D.DATE OF BIRTH:  06/20/1940   DATE OF PROCEDURE:  06/05/2006  DATE OF DISCHARGE:                               OPERATIVE REPORT   PREOPERATIVE DIAGNOSIS:  Stress urinary incontinence.   POSTOPERATIVE DIAGNOSIS:  Stress urinary incontinence.   PROCEDURE:  TVT   SURGEON:  Zenaida Niece, M.D.   ASSISTANT:  Huel Cote, M.D.   ANESTHESIA:  Monitored anesthesia care and local.   SPECIMENS:  None.   ESTIMATED BLOOD LOSS:  100 mL.   COMPLICATIONS:  She first had a TVT Secur placed, but this buttonholed  the vagina and the needles had already been removed.  Thus, this device  was removed and a retropubic TVT was placed.   PROCEDURE IN DETAIL:  The patient was taken to the operating room and  placed in the dorsal supine position.  She was given IV sedation and  placed in mobile stirrups.  Her legs were then elevated appropriately in  stirrups.  The perineum and vagina were then prepped and draped in the  usual sterile fashion and bladder drained with a latex free catheter.  A  weighted speculum was inserted into the vagina and the vagina was  grasped with two Allis clamps just on either side of the midline at the  level of the mid urethra.  Local anesthetic with a mixture of 0.25%  Marcaine and 1% lidocaine with epinephrine was used for local anesthesia  at the site of this incision.  Local anesthesia was also carried out  laterally on the path of dissection.  A vertical incision was made in  the vagina and the edges were grasped with the Allis clamps.   First, I dissected towards the patient's right side.  This was done with  Metzenbaum scissors.  This dissection did create a buttonhole in the  vagina.  The buttonhole was identified and grasped and I was  able then  to continue the dissection laterally to the level of the pubic bone.  This was made wide enough for a knife handle to easily pass to the pubic  bone.  Dissection was then also carried out on the left side without  complications.  The TVT Secur device was then placed first on the  patient's right side and then on the left side.  This was done carefully  on the right side to avoid going through the previously created  buttonhole.  Once both needles were placed, cystoscopy was performed and  confirmed no injury to the bladder or the urethra.  Initial inspection  revealed no evidence of vaginal buttonholing on either side.  The TVT  secur needles were removed and the vaginal incision was closed with  running locking 2-0 Vicryl.  The buttonhole created on the patient's  right side was also closed with 2-0 Vicryl.   Careful inspection then revealed that the TVT Secur had also buttonholed  the vagina on the  patient's left side.  This went through the vaginal  mucosa, into the vagina and then back into the vaginal mucosa, so it  created more than just a hole.  As the needles had been previously  removed, I had no choice but to remove this device.  The vertical  vaginal incision was reopened and the TVT Secur was grasped and removed  easily.  Prior to the procedure, I had discussed this possibility with  the patient and she did agree to proceed with a retropubic TVT.  A  retropubic TVT was then prepared.   The vaginal buttonhole on the left side was repaired with 2-0 Vicryl.  A  little bit of the lower abdomen was prepped with Betadine.  Marks were  made 3 cm lateral to the midline on both sides at the level of the pubic  ramus.  The retropubic TVT was placed first on the patient's right side  and then on the left side.  The needle was placed through the pelvic  fascia and then passed behind the pubic bone up to the marked skin above  the pubic ramus.  A skin incision was made and the  needle was passed  through this incision.  This was done, also, on the patient's left side.  Inspection revealed no evidence of buttonholing.  Cystoscopy was  performed and revealed no needles in the bladder.  The needles were  pulled through the skin and removed sharply and the ends of the sling  were grasped with Kelly clamps.  Curved Mayo scissors were then placed  between the sling and the urethra.  The sling was pulled tight enough to  hold the scissors but not any tighter than that.  The plastic sleeves  were removed.  Excess sling was removed above the abdominal incisions.  Careful inspection again revealed no evidence of buttonholing.   The vertical vaginal incision was again closed with running locking 2-0  Vicryl.  A small amount of bleeding was encountered in the left vaginal  fornix where the previous buttonhole had been made and this was repaired  with 2-0 Vicryl.  The abdominal incisions were closed with Dermabond.  All instruments were then removed from the vagina.  The bladder had been  drained prior to placing the retropubic TVT.  No fluid leakage was noted  with the full bladder once the sling was in place.  The bladder was then  drained at the end of the procedure.  The patient tolerated the  procedure well and was taken to the recovery room in stable condition.  Counts were correct and she received Ancef 1 gram IV prior to procedure.      Zenaida Niece, M.D.  Electronically Signed     TDM/MEDQ  D:  06/05/2006  T:  06/05/2006  Job:  [id number]

## 2010-10-25 ENCOUNTER — Other Ambulatory Visit: Payer: Self-pay | Admitting: Internal Medicine

## 2010-11-12 ENCOUNTER — Other Ambulatory Visit: Payer: Self-pay | Admitting: Internal Medicine

## 2011-01-03 ENCOUNTER — Ambulatory Visit (INDEPENDENT_AMBULATORY_CARE_PROVIDER_SITE_OTHER): Payer: Medicare Other

## 2011-01-03 DIAGNOSIS — Z23 Encounter for immunization: Secondary | ICD-10-CM

## 2011-01-30 ENCOUNTER — Other Ambulatory Visit: Payer: Self-pay | Admitting: Internal Medicine

## 2011-02-10 ENCOUNTER — Other Ambulatory Visit: Payer: Self-pay | Admitting: Internal Medicine

## 2011-03-07 DIAGNOSIS — J309 Allergic rhinitis, unspecified: Secondary | ICD-10-CM | POA: Diagnosis not present

## 2011-03-16 DIAGNOSIS — J309 Allergic rhinitis, unspecified: Secondary | ICD-10-CM | POA: Diagnosis not present

## 2011-03-20 DIAGNOSIS — J309 Allergic rhinitis, unspecified: Secondary | ICD-10-CM | POA: Diagnosis not present

## 2011-03-21 DIAGNOSIS — J309 Allergic rhinitis, unspecified: Secondary | ICD-10-CM | POA: Diagnosis not present

## 2011-03-28 DIAGNOSIS — J309 Allergic rhinitis, unspecified: Secondary | ICD-10-CM | POA: Diagnosis not present

## 2011-04-04 DIAGNOSIS — J309 Allergic rhinitis, unspecified: Secondary | ICD-10-CM | POA: Diagnosis not present

## 2011-04-10 ENCOUNTER — Encounter: Payer: Self-pay | Admitting: Internal Medicine

## 2011-04-10 ENCOUNTER — Ambulatory Visit (INDEPENDENT_AMBULATORY_CARE_PROVIDER_SITE_OTHER): Payer: Medicare Other | Admitting: Internal Medicine

## 2011-04-10 ENCOUNTER — Ambulatory Visit (INDEPENDENT_AMBULATORY_CARE_PROVIDER_SITE_OTHER)
Admission: RE | Admit: 2011-04-10 | Discharge: 2011-04-10 | Disposition: A | Discharge status: Home / Self Care | Payer: Medicare Other | Source: Ambulatory Visit | Attending: Internal Medicine | Admitting: Internal Medicine

## 2011-04-10 VITALS — BP 142/88 | HR 56 | Temp 97.7°F | Resp 12 | Ht 63.0 in | Wt 123.2 lb

## 2011-04-10 DIAGNOSIS — Z0000 Encounter for general adult medical examination without abnormal findings: Secondary | ICD-10-CM

## 2011-04-10 DIAGNOSIS — I10 Essential (primary) hypertension: Secondary | ICD-10-CM

## 2011-04-10 DIAGNOSIS — R0789 Other chest pain: Secondary | ICD-10-CM | POA: Diagnosis not present

## 2011-04-10 DIAGNOSIS — Z1231 Encounter for screening mammogram for malignant neoplasm of breast: Secondary | ICD-10-CM | POA: Diagnosis not present

## 2011-04-10 DIAGNOSIS — E782 Mixed hyperlipidemia: Secondary | ICD-10-CM | POA: Diagnosis not present

## 2011-04-10 DIAGNOSIS — D126 Benign neoplasm of colon, unspecified: Secondary | ICD-10-CM

## 2011-04-10 DIAGNOSIS — R7309 Other abnormal glucose: Secondary | ICD-10-CM

## 2011-04-10 DIAGNOSIS — R9431 Abnormal electrocardiogram [ECG] [EKG]: Secondary | ICD-10-CM | POA: Diagnosis not present

## 2011-04-10 LAB — HEPATIC FUNCTION PANEL
ALT: 20 U/L (ref 0–35)
AST: 22 U/L (ref 0–37)
Alkaline Phosphatase: 40 U/L (ref 39–117)
Bilirubin, Direct: 0 mg/dL (ref 0.0–0.3)
Total Bilirubin: 0.7 mg/dL (ref 0.3–1.2)

## 2011-04-10 LAB — CBC WITH DIFFERENTIAL/PLATELET
Basophils Relative: 0.8 % (ref 0.0–3.0)
Eosinophils Relative: 2.2 % (ref 0.0–5.0)
MCV: 91.2 fl (ref 78.0–100.0)
Monocytes Absolute: 0.3 10*3/uL (ref 0.1–1.0)
Neutrophils Relative %: 42.4 % — ABNORMAL LOW (ref 43.0–77.0)
RBC: 4.21 Mil/uL (ref 3.87–5.11)
WBC: 4.3 10*3/uL — ABNORMAL LOW (ref 4.5–10.5)

## 2011-04-10 LAB — BASIC METABOLIC PANEL
Chloride: 103 mEq/L (ref 96–112)
Creatinine, Ser: 0.6 mg/dL (ref 0.4–1.2)
Potassium: 3.6 mEq/L (ref 3.5–5.1)

## 2011-04-10 LAB — LIPID PANEL
LDL Cholesterol: 105 mg/dL — ABNORMAL HIGH (ref 0–99)
Total CHOL/HDL Ratio: 3

## 2011-04-10 MED ORDER — TRIAMCINOLONE ACETONIDE(NASAL) 55 MCG/ACT NA INHA: 2.0000 | Freq: Every day | NASAL | Status: DC

## 2011-04-10 NOTE — Assessment & Plan Note (Addendum)
EKG reveals nonspecific ST-T wave changes inferiorly & in V  3-4. A single PAC is present. In view of  the atypical posterior chest discomfort which occurs with exertion; cardiology assessment will be pursued

## 2011-04-10 NOTE — Patient Instructions (Signed)
Preventive Health Care: Exercise  30-45  minutes a day, 3-4 days a week. Walking is especially valuable in preventing Osteoporosis. Eat a low-fat diet with lots of fruits and vegetables, up to 7-9 servings per day. Consume less than 30 grams of sugar per day from foods & drinks with High Fructose Corn Syrup as # 1,2,3 or #4 on label. Order for x-rays entered into  the computer; these will be performed at [address] [facility] [address]. across from [facility]. No appointment is necessary. Blood Pressure Goal  Ideally is an AVERAGE < 135/85. This AVERAGE should be calculated from @ least 5-7 BP readings taken @ different times of day on different days of week. You should not respond to isolated BP readings , but rather the AVERAGE for that week

## 2011-04-10 NOTE — Progress Notes (Signed)
Subjective:    Patient ID: [name], female    DOB: 1940-03-05, 71 y.o.   MRN: [id number]  HPI Medicare Wellness Visit:  The following psychosocial & medical history were reviewed as required by Medicare.   Social history: caffeine: 2 cups/ day , alcohol:  no,  tobacco use : never  & exercise :2-3 X/ week X 30 min.   Home & personal  safety / fall risk: no issues, activities of daily living: no limitations , seatbelt use : yes , and smoke alarm employment : yes .  Power of Attorney/Living Will status : in place  Vision ( as recorded per Nurse) & Hearing  evaluation :  See exam Orientation :X3 , memory & recall :good, spelling  testing:excellent ,and mood & affect : normal . Depression / anxiety: denied Travel history :never , immunization status :Shingles needed , transfusion history:  no, and preventive health surveillance ( colonoscopies, BMD , etc as per protocol/ SOC):colonoscopy up to date, Dental care:  Seen < 12 mos . Chart reviewed &  Updated. Active issues reviewed & addressed.       Review of Systems HYPERTENSION: Disease Monitoring: Blood pressure average-141-2/55 Chest pain, palpitations- no       Dyspnea- no Medications: Compliance- yes  Lightheadedness,Syncope-no    Edema- no  PMH of FASTING HYPERGLYCEMIA:Disease Monitoring: Polyuria/phagia/dipsia-no       Visual problems- no Paresthesias-no but occasional Raynaud's  HYPERLIPIDEMIA: Disease Monitoring: See symptoms for Hypertension Medications: Compliance- yes  Abd pain, bowel changes- no   Muscle aches- no but some arthralgias   ROS: Since Christmas she's had intermittent right infrascapular pain. This has occurred while on the treadmill and even while washing dishes on occasion. It responds to Tylenol. As noted she has no GI symptoms. She does not relate this discomfort to position or lifting. The pain is not associated with nausea or diaphoresis       Objective:   Physical Exam Gen.: Healthy and  well-nourished in appearance. Alert, appropriate and cooperative throughout exam. Head: Normocephalic without obvious abnormalities  Eyes: No corneal or conjunctival inflammation noted. Pupils equal round reactive to light and accommodation.  Extraocular motion intact. Vision grossly normal with lenses. Ears: External  ear exam reveals no significant lesions or deformities. Canals: some wax bilaterally  . Hearing is grossly normal bilaterally. Nose: External nasal exam reveals no deformity or inflammation. Nasal mucosa are pink and moist. No lesions or exudates noted.  Mouth: Oral mucosa and oropharynx reveal no lesions or exudates. Teeth in good repair. Neck: No deformities, masses, or tenderness noted. Range of motion  & Thyroid normal Lungs: Normal respiratory effort; chest expands symmetrically. Lungs are clear to auscultation without rales, wheezes, or increased work of breathing. Heart: Normal rate and rhythm. Accentuated , split  S2. No gallop, click, or rub. Grade 1/6 systolic murmur  Abdomen: Bowel sounds normal; abdomen soft and nontender. No masses, organomegaly or hernias noted. Genitalia: Dr Senaida Ores  .                                                                                   Musculoskeletal/extremities: No deformity or scoliosis noted of  the thoracic  or lumbar spine. No clubbing, cyanosis, edema noted. Range of motion  normal .Tone & strength  normal.Joints : mild DIP OA changes. Nail health  good. Vascular: Carotid, radial artery, dorsalis pedis and  posterior tibial pulses are full and equal. No bruits present. Neurologic: Alert and oriented x3. Deep tendon reflexes symmetrical and normal.          Skin: Intact without suspicious lesions or rashes. Lymph: No cervical, axillary lymphadenopathy present. Psych: Mood and affect are normal. Normally interactive                                                                                         Assessment & Plan:  #1  Medicare Wellness Exam; criteria met ; data entered #2 Problem List reviewed ; Assessment/ Recommendations made  #3 atypical posterior chest pain in the context of chronic nonspecific ST-T wave changes Plan: see Orders

## 2011-04-11 DIAGNOSIS — J309 Allergic rhinitis, unspecified: Secondary | ICD-10-CM | POA: Diagnosis not present

## 2011-04-18 DIAGNOSIS — J309 Allergic rhinitis, unspecified: Secondary | ICD-10-CM | POA: Diagnosis not present

## 2011-04-24 ENCOUNTER — Other Ambulatory Visit: Payer: Self-pay | Admitting: Internal Medicine

## 2011-04-24 ENCOUNTER — Encounter: Payer: Self-pay | Admitting: Internal Medicine

## 2011-04-25 DIAGNOSIS — J309 Allergic rhinitis, unspecified: Secondary | ICD-10-CM | POA: Diagnosis not present

## 2011-05-02 DIAGNOSIS — J309 Allergic rhinitis, unspecified: Secondary | ICD-10-CM | POA: Diagnosis not present

## 2011-05-04 ENCOUNTER — Encounter: Payer: Self-pay | Admitting: Cardiology

## 2011-05-04 ENCOUNTER — Ambulatory Visit (INDEPENDENT_AMBULATORY_CARE_PROVIDER_SITE_OTHER): Payer: Medicare Other | Admitting: Cardiology

## 2011-05-04 DIAGNOSIS — R011 Cardiac murmur, unspecified: Secondary | ICD-10-CM

## 2011-05-04 DIAGNOSIS — R079 Chest pain, unspecified: Secondary | ICD-10-CM

## 2011-05-04 DIAGNOSIS — I10 Essential (primary) hypertension: Secondary | ICD-10-CM | POA: Diagnosis not present

## 2011-05-04 DIAGNOSIS — E782 Mixed hyperlipidemia: Secondary | ICD-10-CM

## 2011-05-04 MED ORDER — AMLODIPINE BESYLATE 5 MG PO TABS: 5.0000 mg | ORAL_TABLET | Freq: Every day | ORAL | Status: DC

## 2011-05-04 NOTE — Assessment & Plan Note (Signed)
Blood pressure elevated. Add Norvasc 5 mg daily and adjust as needed.

## 2011-05-04 NOTE — Assessment & Plan Note (Signed)
Continue statin. Lipids and liver monitor by primary care. 

## 2011-05-04 NOTE — Assessment & Plan Note (Signed)
Schedule echocardiogram. Patient sounds to have a mitral regurgitation murmur.

## 2011-05-04 NOTE — Patient Instructions (Signed)
Your physician recommends that you schedule a follow-up appointment in: 4-6 WEEKS  Your physician has requested that you have en exercise stress myoview. For further information please visit [email or web address] Please follow instruction sheet, as given.   Your physician has requested that you have an echocardiogram. Echocardiography is a painless test that uses sound waves to create images of your heart. It provides your doctor with information about the size and shape of your heart and how well your heart's chambers and valves are working. This procedure takes approximately one hour. There are no restrictions for this procedure.   START AMLODIPINE 5 MG ONCE DAILY  START ASPIRIN 81 MG ONCE DAILY

## 2011-05-04 NOTE — Assessment & Plan Note (Signed)
Patient has back pain with both typical and atypical features. Her symptoms are concerning in that they occur when walking on the treadmill relieved with rest. However if she walks quickly off of the treadmill she does not notice symptoms. Plan add enteric-coated aspirin 81 mg daily. Proceed with Myoview. Low threshold for cardiac catheterization.

## 2011-05-04 NOTE — Progress Notes (Signed)
HPI: 71 year old female with no prior cardiac history other than murmur who I'm asked to evaluate for chest pain. The patient states that since December she has had intermittent back pain. It begins under the right scapula and radiates to the left. It occurs with walking on the treadmill and with using her arms while washing dishes. It resolves with rest. Note if she walks fast off of the treadmill she does not have symptoms. There is no associated nausea, dyspnea or diaphoresis. Because of the above we were asked to further evaluate.  Current Outpatient Prescriptions  Medication Sig Dispense Refill  . Calcium Carbonate-Vitamin D (CALCIUM 600+D) 600-400 MG-UNIT per tablet Take 1 tablet by mouth 2 (two) times daily.      . carvedilol (COREG) 12.5 MG tablet TAKE ONE TABLET BY MOUTH TWICE DAILY  180 tablet  3  . cetirizine (ZYRTEC) 10 MG tablet Take 10 mg by mouth as needed.      . Estradiol (VAGIFEM) 10 MCG TABS Place vaginally. 2-3 x weeks      . glucosamine-chondroitin 500-400 MG tablet 2 am 1 pm      . Multiple Vitamin (MULTIVITAMIN) capsule Take 1 capsule by mouth daily.      . Nutritional Supplements (ESTROVEN PO) Take 1 tablet by mouth daily.      . Omega-3 Fatty Acids (FISH OIL PO) Take 1 tablet by mouth daily.      . pravastatin (PRAVACHOL) 40 MG tablet TAKE 1 TABLET BY MOUTH EVERY NIGHT AT BEDTIME  90 tablet  0  . pyridoxine (B-6) 200 MG tablet Take 200 mg by mouth daily.      . ranitidine (ZANTAC) 75 MG tablet Take 75 mg by mouth as needed.      . triamcinolone (NASACORT) 55 MCG/ACT nasal inhaler Place 2 sprays into the nose daily.  1 Inhaler  12  . vitamin C (ASCORBIC ACID) 500 MG tablet Take 500 mg by mouth daily.        Allergies  Allergen Reactions  . Amlodipine Besy-Benazepril Hcl     REACTION: edema of ankles    Past Medical History  Diagnosis Date  . Hyperlipidemia   . Hypertension   . Allergy     perennial ; shots from Dr Corinda Gubler  . Adenomatous colon polyp 2005  .  Murmur     Past Surgical History  Procedure Date  . Total abdominal hysterectomy w/ bilateral salpingoophorectomy     abnormal PAP  . Carpal tunnel release     LUE  . Bladder suspension   . Colon polypectomy 2011    Dr Leone Payor  . Glaucoma surgery     "for narrow lines" (? narrow angle?)    History   Social History  . Marital Status: Married    Spouse Name: N/A    Number of Children: 2  . Years of Education: N/A   Occupational History  . Not on file.   Social History Main Topics  . Smoking status: Never Smoker   . Smokeless tobacco: Not on file  . Alcohol Use: Yes     Rarely  . Drug Use: No  . Sexually Active: Not on file   Other Topics Concern  . Not on file   Social History Narrative  . No narrative on file    Family History  Problem Relation Age of Onset  . Stroke Mother   . Heart disease Father     MI , initially 65  . Cancer Father  lung  . Cancer Maternal Aunt      X 2; Gyn & ? primary  . Diabetes Maternal Grandmother   . Heart failure Mother     ROS:  no fevers or chills, productive cough, hemoptysis, dysphasia, odynophagia, melena, hematochezia, dysuria, hematuria, rash, seizure activity, orthopnea, PND, pedal edema, claudication. Remaining systems are negative.  Physical Exam:   Blood pressure 191/81, pulse 62, height 5\' 3"  (1.6 m), weight 124 lb (56.246 kg).  General:  Well developed/well nourished in NAD Skin warm/dry Patient not depressed No peripheral clubbing Back-normal HEENT-normal/normal eyelids Neck supple/normal carotid upstroke bilaterally; no bruits; no JVD; no thyromegaly chest - CTA/ normal expansion CV - RRR/normal S1 and S2; no rubs or gallops;  PMI nondisplaced; 2/6 systolic murmur apex Abdomen -NT/ND, no HSM, no mass, + bowel sounds, no bruit 2+ femoral pulses, no bruits Ext-no edema, chords, 2+ DP Neuro-grossly nonfocal  ECG 04/10/2011-sinus rhythm with occasional PACs. Axis normal. Nonspecific ST changes.

## 2011-05-08 ENCOUNTER — Ambulatory Visit (HOSPITAL_COMMUNITY): Payer: Medicare Other | Attending: Cardiology | Admitting: Radiology

## 2011-05-08 DIAGNOSIS — R079 Chest pain, unspecified: Secondary | ICD-10-CM | POA: Insufficient documentation

## 2011-05-08 DIAGNOSIS — I10 Essential (primary) hypertension: Secondary | ICD-10-CM | POA: Insufficient documentation

## 2011-05-08 DIAGNOSIS — Z8249 Family history of ischemic heart disease and other diseases of the circulatory system: Secondary | ICD-10-CM | POA: Diagnosis not present

## 2011-05-08 DIAGNOSIS — E785 Hyperlipidemia, unspecified: Secondary | ICD-10-CM | POA: Diagnosis not present

## 2011-05-08 DIAGNOSIS — M549 Dorsalgia, unspecified: Secondary | ICD-10-CM | POA: Insufficient documentation

## 2011-05-08 MED ORDER — TECHNETIUM TC 99M TETROFOSMIN IV KIT
11.0000 | PACK | Freq: Once | INTRAVENOUS | Status: AC | PRN
  Administered 2011-05-08: 11 via INTRAVENOUS

## 2011-05-08 MED ORDER — TECHNETIUM TC 99M TETROFOSMIN IV KIT
33.0000 | PACK | Freq: Once | INTRAVENOUS | Status: AC | PRN
  Administered 2011-05-08: 33 via INTRAVENOUS

## 2011-05-08 NOTE — Progress Notes (Signed)
[facility] SITE 3 NUCLEAR MED [address] [place] [address] [phone]  Cardiology Nuclear Med Study  [name] is a 71 y.o. female [id number] 07-Mar-1940   Nuclear Med Background Indication for Stress Test:  Evaluation for Ischemia History:  No previous documented CAD and 04/10/02 Myocardial Perfusion Study:  EF: 72% NL Cardiac Risk Factors: Family History - CAD, Hypertension and Lipids  Symptoms:  Back Pain   Nuclear Pre-Procedure Caffeine/Decaff Intake:  None NPO After: 8:30pm   Lungs:  clear IV 0.9% NS with Angio Cath:  20g  IV Site: R Antecubital  IV Started by:  Stanton Kidney, EMT-P  Chest Size (in):  36 Cup Size: C  Height: 5\' 3"  (1.6 m)  Weight:  121 lb (54.885 kg)  BMI:  Body mass index is 21.43 kg/(m^2). Tech Comments:  Coreg held > 24 hours, per patient.    Nuclear Med Study 1 or 2 day study: 1 day  Stress Test Type:  Stress  Reading MD: Cassell Clement, MD  Order Authorizing Provider:  B.Crenshaw  Resting Radionuclide: Technetium 44m Tetrofosmin  Resting Radionuclide Dose: 10.8 mCi   Stress Radionuclide:  Technetium 38m Tetrofosmin  Stress Radionuclide Dose: 33.0 mCi           Stress Protocol Rest HR: 57 Stress HR: 133  Rest BP: 191/78 Stress BP: 195/78  Exercise Time (min): 9:15 METS: 10.5   Predicted Max HR: 149 bpm % Max HR: 89.26 bpm Rate Pressure Product: 44010   Dose of Adenosine (mg):  n/a Dose of Lexiscan: n/a mg  Dose of Atropine (mg): n/a Dose of Dobutamine: n/a mcg/kg/min (at max HR)  Stress Test Technologist: Milana Na, EMT-P  Nuclear Technologist:  Domenic Polite, CNMT     Rest Procedure:  Myocardial perfusion imaging was performed at rest 45 minutes following the intravenous administration of Technetium 16m Tetrofosmin. Rest ECG: Sinus Bradycardia  Stress Procedure:  The patient exercised for 9:15.  The patient stopped due to fatigue, back pain and denied any chest pain.  There were non specific  ST-T wave changes.  Technetium 52m Tetrofosmin was injected at peak exercise and myocardial perfusion imaging was performed after a brief delay. Stress ECG: Insignificant upsloping ST segment depression.  QPS Raw Data Images:  Normal; no motion artifact; normal heart/lung ratio. Stress Images:  Normal homogeneous uptake in all areas of the myocardium. Rest Images:  Normal homogeneous uptake in all areas of the myocardium. Subtraction (SDS):  No evidence of ischemia. Transient Ischemic Dilatation (Normal <1.22):  1.12 Lung/Heart Ratio (Normal <0.45):  0.32  Quantitative Gated Spect Images QGS EDV:  79 ml QGS ESV:  27 ml QGS cine images:  NL LV Function; NL Wall Motion QGS EF: 66%  Impression Exercise Capacity:  Good exercise capacity. BP Response:  Normal blood pressure response. Clinical Symptoms:  5/10 back pain.  No chest pain. ECG Impression:  Insignificant upsloping ST segment depression. Comparison with Prior Nuclear Study: No significant change from previous study  Overall Impression:  Normal stress nuclear study. No ischemia.  Normal LV systolic function with EF 66%  Cassell Clement

## 2011-05-09 DIAGNOSIS — J309 Allergic rhinitis, unspecified: Secondary | ICD-10-CM | POA: Diagnosis not present

## 2011-05-14 ENCOUNTER — Other Ambulatory Visit: Payer: Self-pay | Admitting: Internal Medicine

## 2011-05-16 DIAGNOSIS — J309 Allergic rhinitis, unspecified: Secondary | ICD-10-CM | POA: Diagnosis not present

## 2011-05-17 ENCOUNTER — Ambulatory Visit (HOSPITAL_COMMUNITY): Payer: Medicare Other | Attending: Cardiovascular Disease

## 2011-05-17 ENCOUNTER — Other Ambulatory Visit: Payer: Self-pay

## 2011-05-17 DIAGNOSIS — E785 Hyperlipidemia, unspecified: Secondary | ICD-10-CM | POA: Diagnosis not present

## 2011-05-17 DIAGNOSIS — R079 Chest pain, unspecified: Secondary | ICD-10-CM | POA: Diagnosis not present

## 2011-05-17 DIAGNOSIS — I10 Essential (primary) hypertension: Secondary | ICD-10-CM | POA: Diagnosis not present

## 2011-05-17 DIAGNOSIS — R011 Cardiac murmur, unspecified: Secondary | ICD-10-CM

## 2011-05-25 DIAGNOSIS — J309 Allergic rhinitis, unspecified: Secondary | ICD-10-CM | POA: Diagnosis not present

## 2011-06-01 DIAGNOSIS — J309 Allergic rhinitis, unspecified: Secondary | ICD-10-CM | POA: Diagnosis not present

## 2011-06-06 DIAGNOSIS — J309 Allergic rhinitis, unspecified: Secondary | ICD-10-CM | POA: Diagnosis not present

## 2011-06-08 ENCOUNTER — Encounter: Payer: Self-pay | Admitting: *Deleted

## 2011-06-08 ENCOUNTER — Encounter: Payer: Self-pay | Admitting: Cardiology

## 2011-06-08 ENCOUNTER — Ambulatory Visit (INDEPENDENT_AMBULATORY_CARE_PROVIDER_SITE_OTHER): Payer: Medicare Other | Admitting: Cardiology

## 2011-06-08 DIAGNOSIS — I359 Nonrheumatic aortic valve disorder, unspecified: Secondary | ICD-10-CM

## 2011-06-08 DIAGNOSIS — R079 Chest pain, unspecified: Secondary | ICD-10-CM

## 2011-06-08 DIAGNOSIS — I351 Nonrheumatic aortic (valve) insufficiency: Secondary | ICD-10-CM

## 2011-06-08 NOTE — Assessment & Plan Note (Signed)
Plan repeat echocardiogram in one year for aortic and mitral regurgitation.

## 2011-06-08 NOTE — Assessment & Plan Note (Signed)
Patient has back pain with exertion and movements of her upper extremities. I am concerned about potential of coronary disease despite normal Myoview. She also has mild to moderate aortic insufficiency. Plain CTA to evaluate her coronaries and her thoracic aorta. Continue present medications.

## 2011-06-08 NOTE — Assessment & Plan Note (Signed)
Blood pressure improved.Continue present medications. 

## 2011-06-08 NOTE — Assessment & Plan Note (Signed)
Management per primary care. 

## 2011-06-08 NOTE — Progress Notes (Signed)
HPI: Pleasant female I saw in March of 2013 for chest pain. Nuclear study in March of 2013 showed an ejection fraction of 66% and normal perfusion. Echocardiogram in March of 2013 showed an ejection fraction of 55-60%, mild to moderate aortic insufficiency, mild to moderate mitral regurgitation and mild left atrial enlargement. Since I last saw her she continues to note back pain. This occurs with exertion and moving her upper extremities and improves with rest. There is no dyspnea. No syncope.  Current Outpatient Prescriptions  Medication Sig Dispense Refill  . amLODipine (NORVASC) 5 MG tablet Take 1 tablet (5 mg total) by mouth daily.  90 tablet  4  . aspirin EC 81 MG tablet Take 1 tablet (81 mg total) by mouth daily.  150 tablet  2  . Calcium Carbonate-Vitamin D (CALCIUM 600+D) 600-400 MG-UNIT per tablet Take 1 tablet by mouth 2 (two) times daily.      . carvedilol (COREG) 12.5 MG tablet TAKE ONE TABLET BY MOUTH TWICE DAILY  180 tablet  3  . cetirizine (ZYRTEC) 10 MG tablet Take 10 mg by mouth as needed.      . Estradiol (VAGIFEM) 10 MCG TABS Place vaginally. 2-3 x weeks      . glucosamine-chondroitin 500-400 MG tablet 2 am 1 pm      . Multiple Vitamin (MULTIVITAMIN) capsule Take 1 capsule by mouth daily.      . Nutritional Supplements (ESTROVEN PO) Take 1 tablet by mouth daily.      . Omega-3 Fatty Acids (FISH OIL PO) Take 1 tablet by mouth daily.      . pravastatin (PRAVACHOL) 40 MG tablet TAKE 1 TABLET BY MOUTH EVERY NIGHT AT BEDTIME  90 tablet  2  . pyridoxine (B-6) 200 MG tablet Take 200 mg by mouth daily.      . ranitidine (ZANTAC) 75 MG tablet Take 75 mg by mouth as needed.      . triamcinolone (NASACORT) 55 MCG/ACT nasal inhaler Place 2 sprays into the nose daily.  1 Inhaler  12  . vitamin C (ASCORBIC ACID) 500 MG tablet Take 500 mg by mouth daily.         Past Medical History  Diagnosis Date  . Hyperlipidemia   . Hypertension   . Allergy     perennial ; shots from Dr  Corinda Gubler  . Adenomatous colon polyp 2005  . Murmur     Past Surgical History  Procedure Date  . Total abdominal hysterectomy w/ bilateral salpingoophorectomy     abnormal PAP  . Carpal tunnel release     LUE  . Bladder suspension   . Colon polypectomy 2011    Dr Leone Payor  . Glaucoma surgery     "for narrow lines" (? narrow angle?)    History   Social History  . Marital Status: Married    Spouse Name: N/A    Number of Children: 2  . Years of Education: N/A   Occupational History  . Not on file.   Social History Main Topics  . Smoking status: Never Smoker   . Smokeless tobacco: Not on file  . Alcohol Use: Yes     Rarely  . Drug Use: No  . Sexually Active: Not on file   Other Topics Concern  . Not on file   Social History Narrative  . No narrative on file    ROS: no fevers or chills, productive cough, hemoptysis, dysphasia, odynophagia, melena, hematochezia, dysuria, hematuria, rash, seizure activity, orthopnea,  PND, pedal edema, claudication. Remaining systems are negative.  Physical Exam: Well-developed well-nourished in no acute distress.  Skin is warm and dry.  HEENT is normal.  Neck is supple. No thyromegaly.  Chest is clear to auscultation with normal expansion.  Cardiovascular exam is regular rate and rhythm.  Abdominal exam nontender or distended. No masses palpated. Extremities show no edema. neuro grossly intact

## 2011-06-08 NOTE — Patient Instructions (Addendum)
Your physician has requested that you have cardiac CT. Cardiac computed tomography (CT) is a painless test that uses an x-ray machine to take clear, detailed pictures of your heart. For further information please visit [email or web address] Please follow instruction sheet as given.  Your physician recommends that you return for lab work prior to your Cardiac CTA  Your physician recommends that you schedule a follow-up appointment in: 3 months with Dr. Jens Som

## 2011-06-13 DIAGNOSIS — J309 Allergic rhinitis, unspecified: Secondary | ICD-10-CM | POA: Diagnosis not present

## 2011-06-16 ENCOUNTER — Ambulatory Visit (INDEPENDENT_AMBULATORY_CARE_PROVIDER_SITE_OTHER): Payer: Medicare Other | Admitting: Nurse Practitioner

## 2011-06-16 ENCOUNTER — Encounter: Payer: Self-pay | Admitting: Nurse Practitioner

## 2011-06-16 VITALS — BP 162/74 | HR 70 | Ht 63.0 in | Wt 122.0 lb

## 2011-06-16 DIAGNOSIS — E785 Hyperlipidemia, unspecified: Secondary | ICD-10-CM

## 2011-06-16 DIAGNOSIS — I10 Essential (primary) hypertension: Secondary | ICD-10-CM

## 2011-06-16 DIAGNOSIS — I209 Angina pectoris, unspecified: Secondary | ICD-10-CM | POA: Diagnosis not present

## 2011-06-16 DIAGNOSIS — I208 Other forms of angina pectoris: Secondary | ICD-10-CM

## 2011-06-16 NOTE — Patient Instructions (Signed)
Please have lab work done, Friday June 22, 2011.  You can come anytime that day 8:30 - 5:00.  You do not need to be fasting.  No changes have been made to your regular medical regimen.

## 2011-06-16 NOTE — Progress Notes (Signed)
Patient Name: Shelton Soler Date of Encounter: 06/16/2011  Primary Care Provider:  Marga Melnick, MD, MD Primary Cardiologist:  B. Jens Som, MD  Patient Profile  71 y/o female with history of exertional midscapular back pain who presents to discuss pending work-up.  Problem List   Past Medical History  Diagnosis Date  . Hyperlipidemia   . Hypertension   . Allergy     perennial ; shots from Dr Corinda Gubler  . Adenomatous colon polyp 2005  . Murmur     a.  04/2011 Echo:  EF 55-60%, mild-mod ai/mr  . Angina of effort     a. exertional mid-scapular back pain;  b.  04/2011 Ex MV:  back pain w/o st/t changes.  NL perfusion.   Past Surgical History  Procedure Date  . Total abdominal hysterectomy w/ bilateral salpingoophorectomy     abnormal PAP  . Carpal tunnel release     LUE  . Bladder suspension   . Colon polypectomy 2011    Dr Leone Payor  . Glaucoma surgery     "for narrow lines" (? narrow angle?)    Allergies  Allergies  Allergen Reactions  . Amlodipine Besy-Benazepril Hcl     REACTION: edema of ankles  . Lotrel Swelling    HPI  71 year old female with the above problem list.  Patient has been followed closely by Dr. Jens Som over the past month and a half secondary to reported exertional mid scapular back pain.  She underwent a 2-D echocardiogram as well as exercise Myoview in March, which were normal.  She did have the scapular discomfort while undergoing exercise Myoview and has continued to have this symptom with any high level exertion since.  She has followed up with Dr. Jens Som with recommendation for coronary CT angiography.  She presents today because her husband noticed what he thought might be a bruise on her lower back and patient was concerned that maybe this was causing her symptoms.  She wonders if perhaps all her symptoms are related to arthritis and wants clarification as to whether or not she really needs additional cardiac testing.   Home  Medications  Prior to Admission medications   Medication Sig Start Date End Date Taking? Authorizing Provider  amLODipine (NORVASC) 5 MG tablet Take 1 tablet (5 mg total) by mouth daily. 05/04/11 05/03/12 Yes Lewayne Bunting, MD  aspirin EC 81 MG tablet Take 1 tablet (81 mg total) by mouth daily. 05/04/11  Yes Lewayne Bunting, MD  Calcium Carbonate-Vitamin D (CALCIUM 600+D) 600-400 MG-UNIT per tablet Take 1 tablet by mouth 2 (two) times daily.   Yes Historical Provider, MD  carvedilol (COREG) 12.5 MG tablet TAKE ONE TABLET BY MOUTH TWICE DAILY 04/24/11  Yes Pecola Lawless, MD  cetirizine (ZYRTEC) 10 MG tablet Take 10 mg by mouth as needed.   Yes Historical Provider, MD  Estradiol (VAGIFEM) 10 MCG TABS Place vaginally. 2-3 x weeks   Yes Historical Provider, MD  glucosamine-chondroitin 500-400 MG tablet 2 am 1 pm   Yes Historical Provider, MD  Multiple Vitamin (MULTIVITAMIN) capsule Take 1 capsule by mouth daily.   Yes Historical Provider, MD  Nutritional Supplements (ESTROVEN PO) Take 1 tablet by mouth daily.   Yes Historical Provider, MD  Omega-3 Fatty Acids (FISH OIL PO) Take 1 tablet by mouth daily.   Yes Historical Provider, MD  pravastatin (PRAVACHOL) 40 MG tablet TAKE 1 TABLET BY MOUTH EVERY NIGHT AT BEDTIME 05/14/11  Yes Pecola Lawless, MD  pyridoxine (B-6) 200  MG tablet Take 200 mg by mouth daily.   Yes Historical Provider, MD  ranitidine (ZANTAC) 75 MG tablet Take 75 mg by mouth as needed.   Yes Historical Provider, MD  triamcinolone (NASACORT) 55 MCG/ACT nasal inhaler Place 2 sprays into the nose daily. 04/10/11 04/09/12 Yes Pecola Lawless, MD  vitamin C (ASCORBIC ACID) 500 MG tablet Take 500 mg by mouth daily.   Yes Historical Provider, MD    Family History  Family History  Problem Relation Age of Onset  . Stroke Mother   . Heart disease Father     MI , initially 22  . Cancer Father     lung  . Cancer Maternal Aunt      X 2; Gyn & ? primary  . Diabetes Maternal Grandmother    . Heart failure Mother     Social History  History   Social History  . Marital Status: Married    Spouse Name: N/A    Number of Children: 2  . Years of Education: N/A   Occupational History  . Not on file.   Social History Main Topics  . Smoking status: Never Smoker   . Smokeless tobacco: Not on file  . Alcohol Use: Yes     Rarely  . Drug Use: No  . Sexually Active: Not on file   Other Topics Concern  . Not on file   Social History Narrative  . No narrative on file     Review of Systems General:  No chills, fever, night sweats or weight changes.  Cardiovascular:  Exertional, mid-scapular back pain as outlined in the HPI.  No dyspnea on exertion, edema, orthopnea, palpitations, paroxysmal nocturnal dyspnea. Dermatological: No rash, lesions/masses Respiratory: No cough, dyspnea Urologic: No hematuria, dysuria Abdominal:   No nausea, vomiting, diarrhea, bright red blood per rectum, melena, or hematemesis Neurologic:  No visual changes, wkns, changes in mental status. All other systems reviewed and are otherwise negative except as noted above.  Physical Exam  Blood pressure 162/74, pulse 70, height 5\' 3"  (1.6 m), weight 122 lb (55.339 kg).  General: Pleasant, NAD Psych: Normal affect. Neuro: Alert and oriented X 3. Moves all extremities spontaneously. HEENT: Normal  Neck: Supple without bruits or JVD. Lungs:  Resp regular and unlabored, CTA. Heart: RRR no s3, s4. 2/6 syst murmur @ apex.. Abdomen: Soft, non-tender, non-distended, BS + x 4.  An 3x3 area of very slight hyperpigmentation is noted on her mid-back.  This is non-tender and approx 6 inches lower than where her exertional Ss occur. Extremities: No clubbing, cyanosis or edema. DP/PT/Radials 2+ and equal bilaterally.  Accessory Clinical Findings  Cardiac CTA pending 5/1  Assessment & Plan  1.  Exertional Angina:  As Dr. Jens Som has previously noted, despite negative myoview, pt continues to have  exertional mid-scapular back pain.  This typically lasts during activity and resolves within 5 mins of rest.  She is planned for a Cardiac CTA on 5/1.  The area of very slight hyperpigmentation on her back is not a bruise (she's had no trauma that she recalls) and per her husband it's been there for a very long time.  This area is simply more freckled than surrounding areas of skin.  It is not tender and is unlikely to represent anything significant with regards to her Ss.  We discussed this at length and also discussed that although her Ss may be coming from a non-cardiac source, given the nature of her Ss, and known limitations  of stress testing, we still feel that she will need further imaging to r/o the possibility of obstructive CAD.  She understands and is willing to proceed.  CTA is scheduled for 5/1 and so we have arranged for bmet a few days before.  2.  HTN:  BP elevated in office today.  Trends better at home.  Will make no changes today.  She is somewhat anxious about pending work-up, which is likely driving up BP today.  3.  HL:  On statin.  LDL 105 03/2011.  4.  Dispo:  CT on 5/1, f/u Dr. Jens Som in July as scheduled or sooner if CTA abnl.  Nicolasa Ducking, NP 06/16/2011, 9:57 AM

## 2011-06-20 DIAGNOSIS — J309 Allergic rhinitis, unspecified: Secondary | ICD-10-CM | POA: Diagnosis not present

## 2011-06-22 ENCOUNTER — Ambulatory Visit (INDEPENDENT_AMBULATORY_CARE_PROVIDER_SITE_OTHER): Payer: Medicare Other

## 2011-06-22 ENCOUNTER — Other Ambulatory Visit: Payer: Medicare Other

## 2011-06-22 DIAGNOSIS — Z79899 Other long term (current) drug therapy: Secondary | ICD-10-CM

## 2011-06-22 DIAGNOSIS — I351 Nonrheumatic aortic (valve) insufficiency: Secondary | ICD-10-CM

## 2011-06-22 DIAGNOSIS — I10 Essential (primary) hypertension: Secondary | ICD-10-CM | POA: Diagnosis not present

## 2011-06-22 DIAGNOSIS — I359 Nonrheumatic aortic valve disorder, unspecified: Secondary | ICD-10-CM

## 2011-06-23 ENCOUNTER — Telehealth: Payer: Self-pay | Admitting: Cardiology

## 2011-06-23 LAB — BASIC METABOLIC PANEL
Creatinine, Ser: 0.59 mg/dL (ref 0.57–1.00)
GFR calc Af Amer: 106 mL/min/{1.73_m2} (ref >59)
GFR calc non Af Amer: 92 mL/min/{1.73_m2} (ref >59)
Glucose: 115 mg/dL — ABNORMAL HIGH (ref 65–99)
Potassium: 4.1 mmol/L (ref 3.5–5.2)

## 2011-06-23 NOTE — Telephone Encounter (Signed)
Spoke with pt, she was wondering if her cardiac ct had been approved. Left message for precert to call me back.

## 2011-06-23 NOTE — Telephone Encounter (Signed)
Fu call Patient wants to talk to you again

## 2011-06-23 NOTE — Telephone Encounter (Signed)
Pre-cert information with reference number given to pt.

## 2011-06-27 DIAGNOSIS — J309 Allergic rhinitis, unspecified: Secondary | ICD-10-CM | POA: Diagnosis not present

## 2011-06-28 ENCOUNTER — Ambulatory Visit (HOSPITAL_COMMUNITY)
Admission: RE | Admit: 2011-06-28 | Discharge: 2011-06-28 | Disposition: A | Discharge status: Home / Self Care | Payer: Medicare Other | Source: Ambulatory Visit | Attending: Cardiology | Admitting: Cardiology

## 2011-06-28 ENCOUNTER — Inpatient Hospital Stay: Admission: RE | Admit: 2011-06-28 | Payer: Medicare Other | Source: Ambulatory Visit

## 2011-06-28 DIAGNOSIS — K449 Diaphragmatic hernia without obstruction or gangrene: Secondary | ICD-10-CM | POA: Insufficient documentation

## 2011-06-28 DIAGNOSIS — I77810 Thoracic aortic ectasia: Secondary | ICD-10-CM | POA: Diagnosis not present

## 2011-06-28 DIAGNOSIS — I2510 Atherosclerotic heart disease of native coronary artery without angina pectoris: Secondary | ICD-10-CM | POA: Insufficient documentation

## 2011-06-28 DIAGNOSIS — I351 Nonrheumatic aortic (valve) insufficiency: Secondary | ICD-10-CM

## 2011-06-28 MED ORDER — METOPROLOL TARTRATE 1 MG/ML IV SOLN
INTRAVENOUS | Status: AC
  Filled 2011-06-28: qty 20

## 2011-06-28 MED ORDER — NITROGLYCERIN 0.4 MG SL SUBL
SUBLINGUAL_TABLET | SUBLINGUAL | Status: AC
  Administered 2011-06-28: 0.4 mg via SUBLINGUAL
  Filled 2011-06-28: qty 25

## 2011-06-28 MED ORDER — IOHEXOL 350 MG/ML SOLN
80.0000 mL | Freq: Once | INTRAVENOUS | Status: AC | PRN
  Administered 2011-06-28: 80 mL via INTRAVENOUS

## 2011-07-06 DIAGNOSIS — J309 Allergic rhinitis, unspecified: Secondary | ICD-10-CM | POA: Diagnosis not present

## 2011-07-11 DIAGNOSIS — J309 Allergic rhinitis, unspecified: Secondary | ICD-10-CM | POA: Diagnosis not present

## 2011-07-18 DIAGNOSIS — J309 Allergic rhinitis, unspecified: Secondary | ICD-10-CM | POA: Diagnosis not present

## 2011-07-25 DIAGNOSIS — J309 Allergic rhinitis, unspecified: Secondary | ICD-10-CM | POA: Diagnosis not present

## 2011-08-03 DIAGNOSIS — J309 Allergic rhinitis, unspecified: Secondary | ICD-10-CM | POA: Diagnosis not present

## 2011-08-08 DIAGNOSIS — J309 Allergic rhinitis, unspecified: Secondary | ICD-10-CM | POA: Diagnosis not present

## 2011-08-15 DIAGNOSIS — J309 Allergic rhinitis, unspecified: Secondary | ICD-10-CM | POA: Diagnosis not present

## 2011-08-22 DIAGNOSIS — J309 Allergic rhinitis, unspecified: Secondary | ICD-10-CM | POA: Diagnosis not present

## 2011-08-29 DIAGNOSIS — J309 Allergic rhinitis, unspecified: Secondary | ICD-10-CM | POA: Diagnosis not present

## 2011-09-05 DIAGNOSIS — J309 Allergic rhinitis, unspecified: Secondary | ICD-10-CM | POA: Diagnosis not present

## 2011-09-07 ENCOUNTER — Ambulatory Visit (INDEPENDENT_AMBULATORY_CARE_PROVIDER_SITE_OTHER): Payer: Medicare Other | Admitting: Cardiology

## 2011-09-07 ENCOUNTER — Encounter: Payer: Self-pay | Admitting: Cardiology

## 2011-09-07 VITALS — BP 160/80 | HR 56 | Wt 123.0 lb

## 2011-09-07 DIAGNOSIS — R079 Chest pain, unspecified: Secondary | ICD-10-CM | POA: Diagnosis not present

## 2011-09-07 DIAGNOSIS — I351 Nonrheumatic aortic (valve) insufficiency: Secondary | ICD-10-CM

## 2011-09-07 DIAGNOSIS — I2510 Atherosclerotic heart disease of native coronary artery without angina pectoris: Secondary | ICD-10-CM

## 2011-09-07 DIAGNOSIS — I359 Nonrheumatic aortic valve disorder, unspecified: Secondary | ICD-10-CM | POA: Diagnosis not present

## 2011-09-07 DIAGNOSIS — I10 Essential (primary) hypertension: Secondary | ICD-10-CM

## 2011-09-07 DIAGNOSIS — E782 Mixed hyperlipidemia: Secondary | ICD-10-CM

## 2011-09-07 NOTE — Patient Instructions (Addendum)
Your physician wants you to follow-up in: ONE YEAR WITH DR CRENSHAW You will receive a reminder letter in the mail two months in advance. If you don't receive a letter, please call our office to schedule the follow-up appointment.  

## 2011-09-07 NOTE — Assessment & Plan Note (Signed)
She continues to have exertional back pain. Etiology is not clear to me. Her nuclear study was negative for ischemia and her cardiac CT showed plaque but no obstructive disease. I do not think further cardiac workup is indicated. She will followup with her primary care physician. She has question whether arthritis could be contributing and may need x-rays of her spine.

## 2011-09-07 NOTE — Assessment & Plan Note (Signed)
Continue statin. Lipids and liver monitored by primary care. 

## 2011-09-07 NOTE — Assessment & Plan Note (Signed)
Blood pressure is mildly elevated but she follows this at home and it is typically controlled. Continue present medications. 

## 2011-09-07 NOTE — Progress Notes (Signed)
HPI: Pleasant female I saw in March of 2013 for chest pain. Nuclear study in March of 2013 showed an ejection fraction of 66% and normal perfusion. Echocardiogram in March of 2013 showed an ejection fraction of 55-60%, mild to moderate aortic insufficiency, mild to moderate mitral regurgitation and mild left atrial enlargement. Patient had persistent symptoms and a cardiac CT in April of 2013 showed a descending thoracic aorta measuring 4 cm. There was mild plaque in the proximal LAD  But no significant stenosis noted. Calcium score 99. Since she was last seen, has not had chest pain, dyspnea, palpitations or syncope. She continues to have some mid back pain with exertion and using her arms relieved with rest.    Current Outpatient Prescriptions  Medication Sig Dispense Refill  . amLODipine (NORVASC) 5 MG tablet Take 1 tablet (5 mg total) by mouth daily.  90 tablet  4  . aspirin EC 81 MG tablet Take 1 tablet (81 mg total) by mouth daily.  150 tablet  2  . Calcium Carbonate-Vitamin D (CALCIUM 600+D) 600-400 MG-UNIT per tablet Take 1 tablet by mouth 2 (two) times daily.      . carvedilol (COREG) 12.5 MG tablet TAKE ONE TABLET BY MOUTH TWICE DAILY  180 tablet  3  . cetirizine (ZYRTEC) 10 MG tablet Take 10 mg by mouth as needed.      . Estradiol (VAGIFEM) 10 MCG TABS Place vaginally. 2-3 x weeks      . glucosamine-chondroitin 500-400 MG tablet 2 am 1 pm      . Multiple Vitamin (MULTIVITAMIN) capsule Take 1 capsule by mouth daily.      . Nutritional Supplements (ESTROVEN PO) Take 1 tablet by mouth daily.      . Omega-3 Fatty Acids (FISH OIL PO) Take 1 tablet by mouth daily.      . pravastatin (PRAVACHOL) 40 MG tablet TAKE 1 TABLET BY MOUTH EVERY NIGHT AT BEDTIME  90 tablet  2  . pyridoxine (B-6) 200 MG tablet Take 200 mg by mouth daily.      . ranitidine (ZANTAC) 75 MG tablet Take 75 mg by mouth as needed.      . triamcinolone (NASACORT) 55 MCG/ACT nasal inhaler Place 2 sprays into the nose daily.   1 Inhaler  12  . vitamin C (ASCORBIC ACID) 500 MG tablet Take 500 mg by mouth daily.         Past Medical History  Diagnosis Date  . Hyperlipidemia   . Hypertension   . Allergy     perennial ; shots from Dr Corinda Gubler  . Adenomatous colon polyp 2005  . Murmur     a.  04/2011 Echo:  EF 55-60%, mild-mod ai/mr  . Aortic insufficiency     Past Surgical History  Procedure Date  . Total abdominal hysterectomy w/ bilateral salpingoophorectomy     abnormal PAP  . Carpal tunnel release     LUE  . Bladder suspension   . Colon polypectomy 2011    Dr Leone Payor  . Glaucoma surgery     "for narrow lines" (? narrow angle?)    History   Social History  . Marital Status: Married    Spouse Name: N/A    Number of Children: 2  . Years of Education: N/A   Occupational History  . Not on file.   Social History Main Topics  . Smoking status: Never Smoker   . Smokeless tobacco: Not on file  . Alcohol Use: Yes  Rarely  . Drug Use: No  . Sexually Active: Not on file   Other Topics Concern  . Not on file   Social History Narrative  . No narrative on file    ROS: no fevers or chills, productive cough, hemoptysis, dysphasia, odynophagia, melena, hematochezia, dysuria, hematuria, rash, seizure activity, orthopnea, PND, pedal edema, claudication. Remaining systems are negative.  Physical Exam: Well-developed well-nourished in no acute distress.  Skin is warm and dry.  HEENT is normal.  Neck is supple.  Chest is clear to auscultation with normal expansion.  Cardiovascular exam is regular rate and rhythm.  Abdominal exam nontender or distended. No masses palpated. Extremities show no edema. neuro grossly intact  ECG sinus bradycardia at a rate of 56. Nonspecific ST changes.

## 2011-09-07 NOTE — Assessment & Plan Note (Signed)
Patient is noted to have no obstructive disease but there is minor plaque and her calcium score is 99. Continue aspirin and statin.

## 2011-09-07 NOTE — Assessment & Plan Note (Signed)
Plan repeat echocardiogram in one year to assess her aortic insufficiency and mildly dilated aortic root.

## 2011-09-19 DIAGNOSIS — J309 Allergic rhinitis, unspecified: Secondary | ICD-10-CM | POA: Diagnosis not present

## 2011-09-26 DIAGNOSIS — J309 Allergic rhinitis, unspecified: Secondary | ICD-10-CM | POA: Diagnosis not present

## 2011-10-03 DIAGNOSIS — J309 Allergic rhinitis, unspecified: Secondary | ICD-10-CM | POA: Diagnosis not present

## 2011-10-10 DIAGNOSIS — J309 Allergic rhinitis, unspecified: Secondary | ICD-10-CM | POA: Diagnosis not present

## 2011-10-19 DIAGNOSIS — J309 Allergic rhinitis, unspecified: Secondary | ICD-10-CM | POA: Diagnosis not present

## 2011-10-24 DIAGNOSIS — H1045 Other chronic allergic conjunctivitis: Secondary | ICD-10-CM | POA: Diagnosis not present

## 2011-10-24 DIAGNOSIS — J309 Allergic rhinitis, unspecified: Secondary | ICD-10-CM | POA: Diagnosis not present

## 2011-10-24 DIAGNOSIS — J3089 Other allergic rhinitis: Secondary | ICD-10-CM | POA: Diagnosis not present

## 2011-11-02 DIAGNOSIS — Z01419 Encounter for gynecological examination (general) (routine) without abnormal findings: Secondary | ICD-10-CM | POA: Diagnosis not present

## 2011-11-02 DIAGNOSIS — Z124 Encounter for screening for malignant neoplasm of cervix: Secondary | ICD-10-CM | POA: Diagnosis not present

## 2011-11-02 DIAGNOSIS — Z0000 Encounter for general adult medical examination without abnormal findings: Secondary | ICD-10-CM | POA: Diagnosis not present

## 2011-11-07 DIAGNOSIS — J309 Allergic rhinitis, unspecified: Secondary | ICD-10-CM | POA: Diagnosis not present

## 2011-11-14 DIAGNOSIS — J309 Allergic rhinitis, unspecified: Secondary | ICD-10-CM | POA: Diagnosis not present

## 2011-11-16 DIAGNOSIS — L821 Other seborrheic keratosis: Secondary | ICD-10-CM | POA: Diagnosis not present

## 2011-11-16 DIAGNOSIS — L578 Other skin changes due to chronic exposure to nonionizing radiation: Secondary | ICD-10-CM | POA: Diagnosis not present

## 2011-11-16 DIAGNOSIS — L820 Inflamed seborrheic keratosis: Secondary | ICD-10-CM | POA: Diagnosis not present

## 2011-11-16 DIAGNOSIS — D1800 Hemangioma unspecified site: Secondary | ICD-10-CM | POA: Diagnosis not present

## 2011-11-16 DIAGNOSIS — D239 Other benign neoplasm of skin, unspecified: Secondary | ICD-10-CM | POA: Diagnosis not present

## 2011-11-21 DIAGNOSIS — J309 Allergic rhinitis, unspecified: Secondary | ICD-10-CM | POA: Diagnosis not present

## 2011-11-28 DIAGNOSIS — J309 Allergic rhinitis, unspecified: Secondary | ICD-10-CM | POA: Diagnosis not present

## 2011-12-05 DIAGNOSIS — J309 Allergic rhinitis, unspecified: Secondary | ICD-10-CM | POA: Diagnosis not present

## 2011-12-07 DIAGNOSIS — J309 Allergic rhinitis, unspecified: Secondary | ICD-10-CM | POA: Diagnosis not present

## 2011-12-19 DIAGNOSIS — J309 Allergic rhinitis, unspecified: Secondary | ICD-10-CM | POA: Diagnosis not present

## 2011-12-28 ENCOUNTER — Ambulatory Visit (INDEPENDENT_AMBULATORY_CARE_PROVIDER_SITE_OTHER): Payer: Medicare Other | Admitting: *Deleted

## 2011-12-28 DIAGNOSIS — Z23 Encounter for immunization: Secondary | ICD-10-CM

## 2012-01-04 DIAGNOSIS — J309 Allergic rhinitis, unspecified: Secondary | ICD-10-CM | POA: Diagnosis not present

## 2012-01-11 DIAGNOSIS — J309 Allergic rhinitis, unspecified: Secondary | ICD-10-CM | POA: Diagnosis not present

## 2012-01-18 DIAGNOSIS — J309 Allergic rhinitis, unspecified: Secondary | ICD-10-CM | POA: Diagnosis not present

## 2012-01-23 DIAGNOSIS — J309 Allergic rhinitis, unspecified: Secondary | ICD-10-CM | POA: Diagnosis not present

## 2012-01-24 DIAGNOSIS — H25019 Cortical age-related cataract, unspecified eye: Secondary | ICD-10-CM | POA: Diagnosis not present

## 2012-01-24 DIAGNOSIS — H5340 Unspecified visual field defects: Secondary | ICD-10-CM | POA: Diagnosis not present

## 2012-01-24 DIAGNOSIS — H40019 Open angle with borderline findings, low risk, unspecified eye: Secondary | ICD-10-CM | POA: Diagnosis not present

## 2012-02-01 DIAGNOSIS — J309 Allergic rhinitis, unspecified: Secondary | ICD-10-CM | POA: Diagnosis not present

## 2012-02-05 DIAGNOSIS — H1045 Other chronic allergic conjunctivitis: Secondary | ICD-10-CM | POA: Diagnosis not present

## 2012-02-05 DIAGNOSIS — J3089 Other allergic rhinitis: Secondary | ICD-10-CM | POA: Diagnosis not present

## 2012-02-05 DIAGNOSIS — J019 Acute sinusitis, unspecified: Secondary | ICD-10-CM | POA: Diagnosis not present

## 2012-02-08 ENCOUNTER — Other Ambulatory Visit: Payer: Self-pay | Admitting: Internal Medicine

## 2012-02-15 DIAGNOSIS — J309 Allergic rhinitis, unspecified: Secondary | ICD-10-CM | POA: Diagnosis not present

## 2012-02-27 DIAGNOSIS — J309 Allergic rhinitis, unspecified: Secondary | ICD-10-CM | POA: Diagnosis not present

## 2012-03-07 DIAGNOSIS — J309 Allergic rhinitis, unspecified: Secondary | ICD-10-CM | POA: Diagnosis not present

## 2012-03-14 DIAGNOSIS — J309 Allergic rhinitis, unspecified: Secondary | ICD-10-CM | POA: Diagnosis not present

## 2012-03-19 DIAGNOSIS — J309 Allergic rhinitis, unspecified: Secondary | ICD-10-CM | POA: Diagnosis not present

## 2012-03-26 DIAGNOSIS — J309 Allergic rhinitis, unspecified: Secondary | ICD-10-CM | POA: Diagnosis not present

## 2012-04-02 DIAGNOSIS — J309 Allergic rhinitis, unspecified: Secondary | ICD-10-CM | POA: Diagnosis not present

## 2012-04-09 DIAGNOSIS — J309 Allergic rhinitis, unspecified: Secondary | ICD-10-CM | POA: Diagnosis not present

## 2012-04-16 DIAGNOSIS — J309 Allergic rhinitis, unspecified: Secondary | ICD-10-CM | POA: Diagnosis not present

## 2012-05-01 ENCOUNTER — Encounter: Payer: Self-pay | Admitting: Internal Medicine

## 2012-05-01 ENCOUNTER — Ambulatory Visit (INDEPENDENT_AMBULATORY_CARE_PROVIDER_SITE_OTHER): Payer: Medicare Other | Admitting: Internal Medicine

## 2012-05-01 VITALS — BP 130/84 | HR 58 | Temp 97.7°F | Resp 12 | Ht 62.08 in | Wt 125.0 lb

## 2012-05-01 DIAGNOSIS — Z23 Encounter for immunization: Secondary | ICD-10-CM | POA: Diagnosis not present

## 2012-05-01 DIAGNOSIS — Z0000 Encounter for general adult medical examination without abnormal findings: Secondary | ICD-10-CM | POA: Diagnosis not present

## 2012-05-01 DIAGNOSIS — E782 Mixed hyperlipidemia: Secondary | ICD-10-CM

## 2012-05-01 DIAGNOSIS — Z860100 Personal history of colon polyps, unspecified: Secondary | ICD-10-CM

## 2012-05-01 DIAGNOSIS — I10 Essential (primary) hypertension: Secondary | ICD-10-CM

## 2012-05-01 LAB — CBC WITH DIFFERENTIAL/PLATELET
Eosinophils Relative: 1.9 % (ref 0.0–5.0)
Lymphs Abs: 1.7 10*3/uL (ref 0.7–4.0)
MCHC: 33.8 g/dL (ref 30.0–36.0)
MCV: 90.3 fl (ref 78.0–100.0)
Monocytes Relative: 8.7 % (ref 3.0–12.0)
Neutro Abs: 2.2 10*3/uL (ref 1.4–7.7)
Platelets: 161 10*3/uL (ref 150.0–400.0)
RDW: 13.7 % (ref 11.5–14.6)

## 2012-05-01 LAB — BASIC METABOLIC PANEL
BUN: 12 mg/dL (ref 6–23)
CO2: 29 mEq/L (ref 19–32)
Calcium: 9 mg/dL (ref 8.4–10.5)
Chloride: 103 mEq/L (ref 96–112)
Creatinine, Ser: 0.6 mg/dL (ref 0.4–1.2)
Glucose, Bld: 107 mg/dL — ABNORMAL HIGH (ref 70–99)

## 2012-05-01 LAB — HEPATIC FUNCTION PANEL
ALT: 22 U/L (ref 0–35)
AST: 25 U/L (ref 0–37)
Albumin: 4.1 g/dL (ref 3.5–5.2)

## 2012-05-01 LAB — LIPID PANEL
Cholesterol: 160 mg/dL (ref 0–200)
LDL Cholesterol: 97 mg/dL (ref 0–99)

## 2012-05-01 NOTE — Patient Instructions (Addendum)
Preventive Health Care: Exercise  30-45  minutes a day, 3-4 days a week. Walking is especially valuable in preventing Osteoporosis. Eat a low-fat diet with lots of fruits and vegetables, up to 7-9 servings per day. This would eliminate need for vitamin supplements for most individuals. Consume less than 30 grams of sugar per day from foods & drinks with High Fructose Corn Syrup as #1,2,3 or #4 on label. Minimal Blood Pressure Goal= AVERAGE < 140/90;  Ideal is an AVERAGE < 135/85. This AVERAGE should be calculated from @ least 5-7 BP readings taken @ different times of day on different days of week. You should not respond to isolated BP readings , but rather the AVERAGE for that week .Please bring your  blood pressure cuff to office visits to verify that it is reliable.It  can also be checked against the blood pressure device at the pharmacy. Finger or wrist cuffs are not dependable; an arm cuff is. Please do not use Q-tips as we discussed. Should wax build up occur, please put 2-3 drops of mineral oil in the affected  ear at night to soften the wax .Cover the canal with a  cotton ball to prevent the oil from staining bed linens. In the morning fill the ear canal with hydrogen peroxide & lie in the opposite lateral decubitus position(on the side opposite the affected ear)  for 10-15 minutes. After allowing this period of time for the peroxide to dissolve the wax ;shower and use the thinnest washrag available to wick out the wax. If both ears are involved ; alternate this treatment from ear to ear each night until no wax is found on the washrag. Share results with all non [facility] medical staff seen including your Allergist

## 2012-05-01 NOTE — Progress Notes (Signed)
Subjective:    Patient ID: [name], female    DOB: Jun 30, 1940, 72 y.o.   MRN: [id number]  HPI Medicare Wellness Visit:  Psychosocial & medical history were reviewed as required by Medicare (abuse,antisocial behavioral risks,firearm risk).  Social history: caffeine: minimal , alcohol: no  ,  tobacco use: never   Exercise :  See below No home & personal  safety / fall risk Activities of daily living: no limitations  Seatbelt  and smoke alarm employed. Power of Attorney/Living Will status : in place Ophthalmology exam current Hearing evaluation not current Orientation :oriented X 3  Memory & recall :good Spelling testing:good Mood & affect : normal . Depression / anxiety: denied Travel history :  never  Immunization status :Flu given Transfusion history:  none  Preventive health surveillance ( colonoscopies, BMD , mammograms,PAP as per protocol/ SOC):all current / colonoscopy / mammogram /BMD/ PAP  Dental care:  Every 12 mos. Chart reviewed &  Updated. Active issues reviewed & addressed.      Review of Systems She is on a heart healthy diet; she exercises as walking 20-30 minutes 3 times per week without symptoms. She has intermittent R infrascapular chest pain. She denies palpitations, dyspnea, or claudication. Family history is negative for premature coronary disease . With CAD her LDL goal is less than  100, ideally <70. There is medication compliance with the statin. Significant abdominal symptoms, memory deficit, or myalgias denied.  Amlodipine/benazepril was represcribed by her Cardiologist; she did not take this because of the past history of pedal edema and unilateral facial swelling while on this medication. Other than the unilateral facial swelling; there has been no definite angioedema reported .     Objective:   Physical Exam Gen.: Healthy and well-nourished in appearance. Alert, appropriate and cooperative throughout exam.Appears younger than stated age   Head: Normocephalic without obvious abnormalities  Eyes: No corneal or conjunctival inflammation noted. Pupils equal round reactive to light and accommodation.  Extraocular motion intact. Vision grossly normal with lenses Ears: External  ear exam reveals no significant lesions or deformities. Wax bilaterally. Hearing is grossly decreased on L.. Nose: External nasal exam reveals no deformity or inflammation. Nasal mucosa are pink and moist. No lesions or exudates noted.  Mouth: Oral mucosa and oropharynx reveal no lesions or exudates. Teeth in good repair. Neck: No deformities, masses, or tenderness noted. Range of motion &Thyroid normal. Lungs: Normal respiratory effort; chest expands symmetrically. Lungs are clear to auscultation without rales, wheezes, or increased work of breathing. Heart: Normal rate and rhythm. Normal S1 ;split S2. No gallop, click, or rub.Grade 1-1.5 /6 systolic murmur R base with neck radiation . Abdomen: Bowel sounds normal; abdomen soft and nontender. No masses, organomegaly or hernias noted. Genitalia: As per Gyn  ,Dr Senaida Ores                                Musculoskeletal/extremities: Slightly accentuated curvature of upper thoracic spine.  No clubbing, cyanosis,or edema noted. Range of motion normal .Tone & strength  Normal. Joints  reveal mild  DJD DIP changes. Nail health good. Able to lie down & sit up w/o help. Negative SLR bilaterally Vascular: Carotid, radial artery, dorsalis pedis and  posterior tibial pulses are full and equal. No bruits present (see murmur). Neurologic: Alert and oriented x3. Deep tendon reflexes symmetrical and normal.        Skin: Intact without suspicious lesions or rashes. Lymph: No  cervical, axillary lymphadenopathy present. Psych: Mood and affect are normal. Normally interactive                                                                                        Assessment & Plan:  #1 Medicare Wellness Exam; criteria met  ; data entered #2 Problem List reviewed ; Assessment/ Recommendations made Plan: see Orders

## 2012-05-02 ENCOUNTER — Ambulatory Visit: Payer: Medicare Other

## 2012-05-02 LAB — HEMOGLOBIN A1C: Hgb A1c MFr Bld: 5.5 % (ref 4.6–6.5)

## 2012-05-08 ENCOUNTER — Telehealth: Payer: Self-pay | Admitting: Internal Medicine

## 2012-05-08 MED ORDER — CARVEDILOL 12.5 MG PO TABS: ORAL_TABLET | ORAL | Status: DC

## 2012-05-08 NOTE — Telephone Encounter (Signed)
RX sent electronically 

## 2012-05-08 NOTE — Telephone Encounter (Signed)
refill  Carvedilol (Tab) COREG 12.5 MG TAKE ONE TABLET BY MOUTH TWICE DAILY #180 last fill 12.11.13

## 2012-05-09 ENCOUNTER — Other Ambulatory Visit: Payer: Self-pay | Admitting: Internal Medicine

## 2012-05-10 ENCOUNTER — Other Ambulatory Visit: Payer: Self-pay | Admitting: Internal Medicine

## 2012-05-10 DIAGNOSIS — H9319 Tinnitus, unspecified ear: Secondary | ICD-10-CM | POA: Diagnosis not present

## 2012-05-10 DIAGNOSIS — H903 Sensorineural hearing loss, bilateral: Secondary | ICD-10-CM | POA: Diagnosis not present

## 2012-05-16 DIAGNOSIS — J309 Allergic rhinitis, unspecified: Secondary | ICD-10-CM | POA: Diagnosis not present

## 2012-05-23 DIAGNOSIS — J309 Allergic rhinitis, unspecified: Secondary | ICD-10-CM | POA: Diagnosis not present

## 2012-05-28 DIAGNOSIS — J309 Allergic rhinitis, unspecified: Secondary | ICD-10-CM | POA: Diagnosis not present

## 2012-06-05 DIAGNOSIS — Z1231 Encounter for screening mammogram for malignant neoplasm of breast: Secondary | ICD-10-CM | POA: Diagnosis not present

## 2012-06-06 DIAGNOSIS — J309 Allergic rhinitis, unspecified: Secondary | ICD-10-CM | POA: Diagnosis not present

## 2012-06-13 DIAGNOSIS — J309 Allergic rhinitis, unspecified: Secondary | ICD-10-CM | POA: Diagnosis not present

## 2012-06-20 DIAGNOSIS — J309 Allergic rhinitis, unspecified: Secondary | ICD-10-CM | POA: Diagnosis not present

## 2012-06-26 ENCOUNTER — Encounter: Payer: Self-pay | Admitting: Internal Medicine

## 2012-06-27 DIAGNOSIS — J309 Allergic rhinitis, unspecified: Secondary | ICD-10-CM | POA: Diagnosis not present

## 2012-07-02 DIAGNOSIS — J309 Allergic rhinitis, unspecified: Secondary | ICD-10-CM | POA: Diagnosis not present

## 2012-07-11 DIAGNOSIS — J309 Allergic rhinitis, unspecified: Secondary | ICD-10-CM | POA: Diagnosis not present

## 2012-07-25 DIAGNOSIS — J309 Allergic rhinitis, unspecified: Secondary | ICD-10-CM | POA: Diagnosis not present

## 2012-07-30 DIAGNOSIS — H40019 Open angle with borderline findings, low risk, unspecified eye: Secondary | ICD-10-CM | POA: Diagnosis not present

## 2012-08-01 DIAGNOSIS — J309 Allergic rhinitis, unspecified: Secondary | ICD-10-CM | POA: Diagnosis not present

## 2012-08-08 DIAGNOSIS — J309 Allergic rhinitis, unspecified: Secondary | ICD-10-CM | POA: Diagnosis not present

## 2012-08-15 DIAGNOSIS — J309 Allergic rhinitis, unspecified: Secondary | ICD-10-CM | POA: Diagnosis not present

## 2012-08-22 DIAGNOSIS — J309 Allergic rhinitis, unspecified: Secondary | ICD-10-CM | POA: Diagnosis not present

## 2012-08-29 DIAGNOSIS — J309 Allergic rhinitis, unspecified: Secondary | ICD-10-CM | POA: Diagnosis not present

## 2012-09-05 DIAGNOSIS — J309 Allergic rhinitis, unspecified: Secondary | ICD-10-CM | POA: Diagnosis not present

## 2012-09-12 DIAGNOSIS — J309 Allergic rhinitis, unspecified: Secondary | ICD-10-CM | POA: Diagnosis not present

## 2012-09-19 DIAGNOSIS — J309 Allergic rhinitis, unspecified: Secondary | ICD-10-CM | POA: Diagnosis not present

## 2012-09-24 DIAGNOSIS — J309 Allergic rhinitis, unspecified: Secondary | ICD-10-CM | POA: Diagnosis not present

## 2012-10-03 DIAGNOSIS — J309 Allergic rhinitis, unspecified: Secondary | ICD-10-CM | POA: Diagnosis not present

## 2012-10-15 DIAGNOSIS — J309 Allergic rhinitis, unspecified: Secondary | ICD-10-CM | POA: Diagnosis not present

## 2012-10-22 DIAGNOSIS — J309 Allergic rhinitis, unspecified: Secondary | ICD-10-CM | POA: Diagnosis not present

## 2012-10-31 DIAGNOSIS — J309 Allergic rhinitis, unspecified: Secondary | ICD-10-CM | POA: Diagnosis not present

## 2012-11-08 DIAGNOSIS — H1045 Other chronic allergic conjunctivitis: Secondary | ICD-10-CM | POA: Diagnosis not present

## 2012-11-08 DIAGNOSIS — J3089 Other allergic rhinitis: Secondary | ICD-10-CM | POA: Diagnosis not present

## 2012-11-08 DIAGNOSIS — J3081 Allergic rhinitis due to animal (cat) (dog) hair and dander: Secondary | ICD-10-CM | POA: Diagnosis not present

## 2012-11-12 DIAGNOSIS — J309 Allergic rhinitis, unspecified: Secondary | ICD-10-CM | POA: Diagnosis not present

## 2012-11-13 ENCOUNTER — Other Ambulatory Visit: Payer: Self-pay | Admitting: *Deleted

## 2012-11-13 MED ORDER — CARVEDILOL 12.5 MG PO TABS: ORAL_TABLET | ORAL | Status: DC

## 2012-11-13 NOTE — Telephone Encounter (Signed)
Rx was refilled for carvedilol 12.5 mg.  Ag cma

## 2012-11-19 DIAGNOSIS — J309 Allergic rhinitis, unspecified: Secondary | ICD-10-CM | POA: Diagnosis not present

## 2012-11-26 DIAGNOSIS — J309 Allergic rhinitis, unspecified: Secondary | ICD-10-CM | POA: Diagnosis not present

## 2012-12-05 DIAGNOSIS — J309 Allergic rhinitis, unspecified: Secondary | ICD-10-CM | POA: Diagnosis not present

## 2012-12-12 DIAGNOSIS — J309 Allergic rhinitis, unspecified: Secondary | ICD-10-CM | POA: Diagnosis not present

## 2012-12-17 DIAGNOSIS — J309 Allergic rhinitis, unspecified: Secondary | ICD-10-CM | POA: Diagnosis not present

## 2012-12-19 DIAGNOSIS — R3915 Urgency of urination: Secondary | ICD-10-CM | POA: Diagnosis not present

## 2012-12-19 DIAGNOSIS — Z01419 Encounter for gynecological examination (general) (routine) without abnormal findings: Secondary | ICD-10-CM | POA: Diagnosis not present

## 2012-12-24 DIAGNOSIS — J309 Allergic rhinitis, unspecified: Secondary | ICD-10-CM | POA: Diagnosis not present

## 2012-12-25 ENCOUNTER — Ambulatory Visit (INDEPENDENT_AMBULATORY_CARE_PROVIDER_SITE_OTHER): Payer: Medicare Other

## 2012-12-25 DIAGNOSIS — Z23 Encounter for immunization: Secondary | ICD-10-CM | POA: Diagnosis not present

## 2012-12-31 DIAGNOSIS — J309 Allergic rhinitis, unspecified: Secondary | ICD-10-CM | POA: Diagnosis not present

## 2013-01-07 DIAGNOSIS — J309 Allergic rhinitis, unspecified: Secondary | ICD-10-CM | POA: Diagnosis not present

## 2013-01-14 DIAGNOSIS — J309 Allergic rhinitis, unspecified: Secondary | ICD-10-CM | POA: Diagnosis not present

## 2013-01-21 DIAGNOSIS — J309 Allergic rhinitis, unspecified: Secondary | ICD-10-CM | POA: Diagnosis not present

## 2013-01-28 DIAGNOSIS — J309 Allergic rhinitis, unspecified: Secondary | ICD-10-CM | POA: Diagnosis not present

## 2013-02-04 DIAGNOSIS — J309 Allergic rhinitis, unspecified: Secondary | ICD-10-CM | POA: Diagnosis not present

## 2013-02-11 DIAGNOSIS — J309 Allergic rhinitis, unspecified: Secondary | ICD-10-CM | POA: Diagnosis not present

## 2013-02-18 DIAGNOSIS — J309 Allergic rhinitis, unspecified: Secondary | ICD-10-CM | POA: Diagnosis not present

## 2013-02-24 DIAGNOSIS — J3081 Allergic rhinitis due to animal (cat) (dog) hair and dander: Secondary | ICD-10-CM | POA: Diagnosis not present

## 2013-02-24 DIAGNOSIS — J3089 Other allergic rhinitis: Secondary | ICD-10-CM | POA: Diagnosis not present

## 2013-02-24 DIAGNOSIS — H1045 Other chronic allergic conjunctivitis: Secondary | ICD-10-CM | POA: Diagnosis not present

## 2013-02-24 DIAGNOSIS — J019 Acute sinusitis, unspecified: Secondary | ICD-10-CM | POA: Diagnosis not present

## 2013-03-13 DIAGNOSIS — J309 Allergic rhinitis, unspecified: Secondary | ICD-10-CM | POA: Diagnosis not present

## 2013-03-25 DIAGNOSIS — J309 Allergic rhinitis, unspecified: Secondary | ICD-10-CM | POA: Diagnosis not present

## 2013-04-01 DIAGNOSIS — J309 Allergic rhinitis, unspecified: Secondary | ICD-10-CM | POA: Diagnosis not present

## 2013-04-08 DIAGNOSIS — J309 Allergic rhinitis, unspecified: Secondary | ICD-10-CM | POA: Diagnosis not present

## 2013-04-22 DIAGNOSIS — J309 Allergic rhinitis, unspecified: Secondary | ICD-10-CM | POA: Diagnosis not present

## 2013-04-29 DIAGNOSIS — J309 Allergic rhinitis, unspecified: Secondary | ICD-10-CM | POA: Diagnosis not present

## 2013-05-02 ENCOUNTER — Other Ambulatory Visit: Payer: Self-pay | Admitting: *Deleted

## 2013-05-02 NOTE — Telephone Encounter (Signed)
Rx denied unstill pt comes in for CPE,which is scheduled for (05-12-13)  Pt aware.//AB/CMA

## 2013-05-03 ENCOUNTER — Other Ambulatory Visit: Payer: Self-pay | Admitting: Internal Medicine

## 2013-05-06 DIAGNOSIS — J309 Allergic rhinitis, unspecified: Secondary | ICD-10-CM | POA: Diagnosis not present

## 2013-05-12 ENCOUNTER — Ambulatory Visit (INDEPENDENT_AMBULATORY_CARE_PROVIDER_SITE_OTHER): Payer: Medicare Other | Admitting: Internal Medicine

## 2013-05-12 ENCOUNTER — Encounter: Payer: Self-pay | Admitting: Internal Medicine

## 2013-05-12 ENCOUNTER — Other Ambulatory Visit (INDEPENDENT_AMBULATORY_CARE_PROVIDER_SITE_OTHER): Payer: Medicare Other

## 2013-05-12 VITALS — BP 190/100 | HR 72 | Temp 97.9°F | Resp 15 | Wt 126.6 lb

## 2013-05-12 DIAGNOSIS — E782 Mixed hyperlipidemia: Secondary | ICD-10-CM | POA: Diagnosis not present

## 2013-05-12 DIAGNOSIS — R7309 Other abnormal glucose: Secondary | ICD-10-CM | POA: Diagnosis not present

## 2013-05-12 DIAGNOSIS — I10 Essential (primary) hypertension: Secondary | ICD-10-CM | POA: Diagnosis not present

## 2013-05-12 LAB — LIPID PANEL
CHOL/HDL RATIO: 3
Cholesterol: 178 mg/dL (ref 0–200)
HDL: 54.9 mg/dL (ref >39.00)
LDL Cholesterol: 103 mg/dL — ABNORMAL HIGH (ref 0–99)
Triglycerides: 99 mg/dL (ref 0.0–149.0)
VLDL: 19.8 mg/dL (ref 0.0–40.0)

## 2013-05-12 LAB — HEPATIC FUNCTION PANEL
ALBUMIN: 4.5 g/dL (ref 3.5–5.2)
ALK PHOS: 40 U/L (ref 39–117)
ALT: 21 U/L (ref 0–35)
AST: 25 U/L (ref 0–37)
BILIRUBIN DIRECT: 0.1 mg/dL (ref 0.0–0.3)
TOTAL PROTEIN: 7.4 g/dL (ref 6.0–8.3)
Total Bilirubin: 0.7 mg/dL (ref 0.3–1.2)

## 2013-05-12 LAB — BASIC METABOLIC PANEL
BUN: 15 mg/dL (ref 6–23)
CALCIUM: 9.5 mg/dL (ref 8.4–10.5)
CO2: 32 meq/L (ref 19–32)
Chloride: 103 mEq/L (ref 96–112)
Creatinine, Ser: 0.6 mg/dL (ref 0.4–1.2)
GFR: 103.87 mL/min (ref >60.00)
GLUCOSE: 113 mg/dL — AB (ref 70–99)
Potassium: 4.2 mEq/L (ref 3.5–5.1)
SODIUM: 139 meq/L (ref 135–145)

## 2013-05-12 LAB — HEMOGLOBIN A1C: Hgb A1c MFr Bld: 5.8 % (ref 4.6–6.5)

## 2013-05-12 LAB — TSH: TSH: 3.54 u[IU]/mL (ref 0.35–5.50)

## 2013-05-12 MED ORDER — HYDROCHLOROTHIAZIDE 12.5 MG PO CAPS: 12.5000 mg | ORAL_CAPSULE | Freq: Every day | ORAL | Status: DC

## 2013-05-12 NOTE — Progress Notes (Signed)
Pre visit review using our clinic review tool, if applicable. No additional management support is needed unless otherwise documented below in the visit note. 

## 2013-05-12 NOTE — Assessment & Plan Note (Addendum)
Lipids, LFTs,CK,TSH  The pravastatin 40 will be changed to Crestor 20 this is cheaper on her plan. The EMR formulary seems to suggest that is the case.

## 2013-05-12 NOTE — Assessment & Plan Note (Signed)
A1c

## 2013-05-12 NOTE — Patient Instructions (Addendum)
Your next office appointment will be determined based upon review of your pending labs . Those instructions will be transmitted to you by mail .Minimal Blood Pressure Goal= AVERAGE < 140/90;  Ideal is an AVERAGE < 135/85. This AVERAGE should be calculated from @ least 5-7 BP readings taken @ different times of day on different days of week. You should not respond to isolated BP readings , but rather the AVERAGE for that week .Please bring your  blood pressure cuff to office visits to verify that it is reliable.It  can also be checked against the blood pressure device at the pharmacy. Finger or wrist cuffs are not dependable; an arm cuff is.  Generic Microzide added for better blood pressure control  She will check to see if Crestor is less expensive than pravastatin 42 through her plan

## 2013-05-12 NOTE — Progress Notes (Signed)
Subjective:    Patient ID: [name], female    DOB: [birth year], 73 y.o.   MRN: [id number]  HPI here today for medication refills.    Review of Systems  Constitutional: Negative for fever, chills, diaphoresis, activity change and appetite change.  HENT: Negative for hearing loss and nosebleeds.   Respiratory: Negative for cough and shortness of breath.   Cardiovascular: Negative for chest pain, palpitations and leg swelling.  Endocrine: Positive for cold intolerance.  Genitourinary: Positive for frequency.  Allergic/Immunologic: Positive for environmental allergies.  Neurological: Negative for syncope, weakness, light-headedness and headaches.  Psychiatric/Behavioral: Positive for sleep disturbance (Difficulty sleeping about once a week).   Walks on treadmill 20-30 minutes twice a week and housework for exercise. Avoids fried foods and white carbs. HYPERTENSION:  Disease Monitoring: checks several times week at different times a day Blood pressure range/ average : ranges 130/55 to 140/64 Medication Compliance: yes  FASTING HYPERGLYCEMIA OR Diabetes :  FBS range/average: doesn't check Highest 2 hr post meal glucose: na Medication compliance:na Hypoglycemia: na Ophthamology care: yearly, last visit June 2014 Podiatry care: na  HYPERLIPIDEMIA:  Medication Compliance: yes  Chest pain, palpitations: no       Dyspnea: no Edema: no Claudication: no Lightheadedness,Syncope: no Weight gain/loss: no Polyuria/phagia/dipsia:  Urinates frequently due to bladder prolapse Blurred vision /diplopia/lossof vision: no Limb numbness/tingling/burning: no Non healing skin lesions: no Abd pain, bowel changes:  no Myalgias: no Memory loss: no      Objective:   Physical Exam  Constitutional: She is oriented to person, place, and time. She appears well-developed and well-nourished. No distress.  HENT:  Head: Normocephalic and atraumatic.  Right Ear: External ear normal.    Left Ear: External ear normal.  Nose: Nose normal.  Mouth/Throat: Oropharynx is clear and moist. No oropharyngeal exudate.  Eyes: Conjunctivae are normal. Pupils are equal, round, and reactive to light. Right eye exhibits no discharge. Left eye exhibits no discharge. No scleral icterus.  Neck: Normal range of motion. Neck supple. No JVD present. No tracheal deviation present. No thyromegaly present.  Cardiovascular: Normal rate, regular rhythm and intact distal pulses.  Exam reveals no gallop and no friction rub.   Murmur heard. Pulmonary/Chest: Effort normal and breath sounds normal. No stridor. No respiratory distress.  Abdominal: Soft. Bowel sounds are normal. She exhibits no distension. There is no tenderness. There is no rebound and no guarding.  Musculoskeletal: Normal range of motion. She exhibits no edema and no tenderness.  Lymphadenopathy:    She has no cervical adenopathy.       Right axillary: No pectoral and no lateral adenopathy present.       Left axillary: No pectoral and no lateral adenopathy present. Neurological: She is alert and oriented to person, place, and time. She has normal strength. No cranial nerve deficit or sensory deficit. Gait normal.  Reflex Scores:      Brachioradialis reflexes are 2+ on the right side and 2+ on the left side.      Patellar reflexes are 2+ on the right side and 2+ on the left side.      Achilles reflexes are 2+ on the right side and 2+ on the left side. Normal heel to toe and able to walk on toes.  Skin: Skin is warm and dry. She is not diaphoretic.  Psychiatric: She has a normal mood and affect. Her behavior is normal. Judgment and thought content normal.          Assessment &  Plan:

## 2013-05-12 NOTE — Assessment & Plan Note (Addendum)
BMET Add Microzide

## 2013-05-12 NOTE — Progress Notes (Signed)
Subjective:    Patient ID: [name], female    DOB: [birth year], 73 y.o.   MRN: [id number]  HPI HPI here today for medication refills.      Review of Systems  Constitutional: Negative for fever, chills, diaphoresis, activity change and appetite change.  HENT: Negative for hearing loss and nosebleeds.  Respiratory: Negative for cough and shortness of breath.  Cardiovascular: Negative for chest pain, palpitations and leg swelling.  Endocrine: Positive for cold intolerance.  Genitourinary: Positive for frequency.  Allergic/Immunologic: Positive for environmental allergies.  Neurological: Negative for syncope, weakness, light-headedness and headaches.  Psychiatric/Behavioral: Positive for sleep disturbance (Difficulty sleeping about once a week).   Walks on treadmill 20-30 minutes twice a week and housework for exercise.  Avoids fried foods and white carbs.   HYPERTENSION:  Disease Monitoring: checks several times week at different times a day  Blood pressure range/ average : ranges 130/55 to 140/64  Medication Compliance: yes  FASTING HYPERGLYCEMIA  :  FBS range/average: doesn't check  Highest 2 hr post meal glucose: na  Medication compliance:na  Hypoglycemia: na  Ophthamology care: yearly, last visit June 2014  Podiatry care: na  HYPERLIPIDEMIA:  Medication Compliance: yes  Chest pain, palpitations: no  Dyspnea: no  Edema: no  Claudication: no  Lightheadedness,Syncope: no  Weight gain/loss: no  Polyuria/phagia/dipsia: Urinates frequently due to bladder prolapse  Blurred vision /diplopia/lossof vision: no  Limb numbness/tingling/burning: no  Non healing skin lesions: no  Abd pain, bowel changes: no  Myalgias: no  Memory loss: no       Objective:   Physical Exam     She is oriented to person, place, and time. She appears well-developed and well-nourished. No distress.  HENT:  Head: Normocephalic and atraumatic.  Right Ear: External ear normal.  Left  Ear: External ear normal.  Nose: Nose normal.  Mouth/Throat: Oropharynx is clear and moist. No oropharyngeal exudate.  Eyes: Conjunctivae are normal. Pupils are equal, round, and reactive to light. Right eye exhibits no discharge. Left eye exhibits no discharge. No scleral icterus.  Neck: Normal range of motion. Neck supple. No JVD present. No tracheal deviation present. No thyromegaly present.  Cardiovascular: Normal rate, regular rhythm and intact distal pulses. Exam reveals no gallop and no friction rub.  Murmur heard; Grade 1/2-1 L base. Split S2 Repeat BP 160/90 X2  Pulmonary/Chest: Effort normal and breath sounds normal. No stridor. No respiratory distress.  Abdominal: Soft. Bowel sounds are normal. She exhibits no distension. There is no tenderness. There is no rebound and no guarding.  Musculoskeletal: Normal range of motion. Some thoracic lordosis.She exhibits no edema and no tenderness.  Lymphadenopathy:  She has no cervical adenopathy.  No axillary adenopathy present. Neurological: She is alert and oriented to person, place, and time. She has normal strength. No cranial nerve deficit or sensory deficit. Gait normal.  Reflex Scores:  Biceps reflexes are 2+ on the right side and 2+ on the left side.  Patellar reflexes are 2+ on the right side and 2+ on the left side.  Achilles reflexes are 2+ on the right side and 2+ on the left side. Normal heel to toe and able to walk on toes.  Skin: Skin is warm and dry. She is not diaphoretic.  Psychiatric: She has a normal mood and affect. Her behavior is normal. Judgment and thought content normal.          Assessment & Plan:  See Current Assessment & Plan in Problem List under specific  Diagnosis

## 2013-05-13 ENCOUNTER — Telehealth: Payer: Self-pay | Admitting: *Deleted

## 2013-05-13 DIAGNOSIS — J309 Allergic rhinitis, unspecified: Secondary | ICD-10-CM | POA: Diagnosis not present

## 2013-05-13 NOTE — Telephone Encounter (Signed)
Left detailed message on pts VM of MDs message. 

## 2013-05-13 NOTE — Telephone Encounter (Signed)
Pt called to give PCP medication pricing Pravastatin $17.50 (generic), Crestor $92.50 (brand) these are prices for 90 day supply.  Please advise

## 2013-05-13 NOTE — Telephone Encounter (Signed)
Stick with Pravastatin definitely !

## 2013-05-14 ENCOUNTER — Other Ambulatory Visit: Payer: Self-pay | Admitting: *Deleted

## 2013-05-14 MED ORDER — PRAVASTATIN SODIUM 40 MG PO TABS: ORAL_TABLET | ORAL | Status: DC

## 2013-05-15 ENCOUNTER — Encounter: Payer: Self-pay | Admitting: *Deleted

## 2013-05-20 DIAGNOSIS — J309 Allergic rhinitis, unspecified: Secondary | ICD-10-CM | POA: Diagnosis not present

## 2013-05-27 DIAGNOSIS — J309 Allergic rhinitis, unspecified: Secondary | ICD-10-CM | POA: Diagnosis not present

## 2013-06-03 DIAGNOSIS — J309 Allergic rhinitis, unspecified: Secondary | ICD-10-CM | POA: Diagnosis not present

## 2013-06-06 DIAGNOSIS — Z1231 Encounter for screening mammogram for malignant neoplasm of breast: Secondary | ICD-10-CM | POA: Diagnosis not present

## 2013-06-10 DIAGNOSIS — J309 Allergic rhinitis, unspecified: Secondary | ICD-10-CM | POA: Diagnosis not present

## 2013-06-17 DIAGNOSIS — J309 Allergic rhinitis, unspecified: Secondary | ICD-10-CM | POA: Diagnosis not present

## 2013-06-24 DIAGNOSIS — J309 Allergic rhinitis, unspecified: Secondary | ICD-10-CM | POA: Diagnosis not present

## 2013-07-01 DIAGNOSIS — J309 Allergic rhinitis, unspecified: Secondary | ICD-10-CM | POA: Diagnosis not present

## 2013-07-08 DIAGNOSIS — J309 Allergic rhinitis, unspecified: Secondary | ICD-10-CM | POA: Diagnosis not present

## 2013-07-17 DIAGNOSIS — J309 Allergic rhinitis, unspecified: Secondary | ICD-10-CM | POA: Diagnosis not present

## 2013-07-24 DIAGNOSIS — J309 Allergic rhinitis, unspecified: Secondary | ICD-10-CM | POA: Diagnosis not present

## 2013-07-29 DIAGNOSIS — J309 Allergic rhinitis, unspecified: Secondary | ICD-10-CM | POA: Diagnosis not present

## 2013-08-05 DIAGNOSIS — J309 Allergic rhinitis, unspecified: Secondary | ICD-10-CM | POA: Diagnosis not present

## 2013-08-07 ENCOUNTER — Other Ambulatory Visit: Payer: Self-pay

## 2013-08-07 MED ORDER — CARVEDILOL 12.5 MG PO TABS: ORAL_TABLET | ORAL | Status: DC

## 2013-08-11 ENCOUNTER — Other Ambulatory Visit: Payer: Self-pay

## 2013-08-11 MED ORDER — CARVEDILOL 12.5 MG PO TABS: ORAL_TABLET | ORAL | Status: DC

## 2013-08-14 ENCOUNTER — Other Ambulatory Visit: Payer: Self-pay | Admitting: Internal Medicine

## 2013-08-14 DIAGNOSIS — J301 Allergic rhinitis due to pollen: Secondary | ICD-10-CM | POA: Diagnosis not present

## 2013-08-19 DIAGNOSIS — J309 Allergic rhinitis, unspecified: Secondary | ICD-10-CM | POA: Diagnosis not present

## 2013-08-20 DIAGNOSIS — H40019 Open angle with borderline findings, low risk, unspecified eye: Secondary | ICD-10-CM | POA: Diagnosis not present

## 2013-08-20 DIAGNOSIS — H25019 Cortical age-related cataract, unspecified eye: Secondary | ICD-10-CM | POA: Diagnosis not present

## 2013-08-20 DIAGNOSIS — H18519 Endothelial corneal dystrophy, unspecified eye: Secondary | ICD-10-CM | POA: Diagnosis not present

## 2013-08-20 DIAGNOSIS — H52209 Unspecified astigmatism, unspecified eye: Secondary | ICD-10-CM | POA: Diagnosis not present

## 2013-08-26 DIAGNOSIS — J309 Allergic rhinitis, unspecified: Secondary | ICD-10-CM | POA: Diagnosis not present

## 2013-09-02 DIAGNOSIS — J309 Allergic rhinitis, unspecified: Secondary | ICD-10-CM | POA: Diagnosis not present

## 2013-09-09 DIAGNOSIS — J309 Allergic rhinitis, unspecified: Secondary | ICD-10-CM | POA: Diagnosis not present

## 2013-09-16 DIAGNOSIS — J309 Allergic rhinitis, unspecified: Secondary | ICD-10-CM | POA: Diagnosis not present

## 2013-09-19 DIAGNOSIS — J309 Allergic rhinitis, unspecified: Secondary | ICD-10-CM | POA: Diagnosis not present

## 2013-09-23 DIAGNOSIS — J309 Allergic rhinitis, unspecified: Secondary | ICD-10-CM | POA: Diagnosis not present

## 2013-09-30 DIAGNOSIS — J309 Allergic rhinitis, unspecified: Secondary | ICD-10-CM | POA: Diagnosis not present

## 2013-10-07 DIAGNOSIS — J309 Allergic rhinitis, unspecified: Secondary | ICD-10-CM | POA: Diagnosis not present

## 2013-10-14 DIAGNOSIS — J309 Allergic rhinitis, unspecified: Secondary | ICD-10-CM | POA: Diagnosis not present

## 2013-10-21 DIAGNOSIS — J309 Allergic rhinitis, unspecified: Secondary | ICD-10-CM | POA: Diagnosis not present

## 2013-10-28 DIAGNOSIS — J309 Allergic rhinitis, unspecified: Secondary | ICD-10-CM | POA: Diagnosis not present

## 2013-11-02 ENCOUNTER — Other Ambulatory Visit: Payer: Self-pay | Admitting: Internal Medicine

## 2013-11-04 DIAGNOSIS — J3081 Allergic rhinitis due to animal (cat) (dog) hair and dander: Secondary | ICD-10-CM | POA: Diagnosis not present

## 2013-11-04 DIAGNOSIS — H1045 Other chronic allergic conjunctivitis: Secondary | ICD-10-CM | POA: Diagnosis not present

## 2013-11-04 DIAGNOSIS — J309 Allergic rhinitis, unspecified: Secondary | ICD-10-CM | POA: Diagnosis not present

## 2013-11-04 DIAGNOSIS — J3089 Other allergic rhinitis: Secondary | ICD-10-CM | POA: Diagnosis not present

## 2013-11-07 ENCOUNTER — Other Ambulatory Visit: Payer: Self-pay

## 2013-11-07 MED ORDER — CARVEDILOL 12.5 MG PO TABS: ORAL_TABLET | ORAL | Status: DC

## 2013-11-11 DIAGNOSIS — J309 Allergic rhinitis, unspecified: Secondary | ICD-10-CM | POA: Diagnosis not present

## 2013-11-18 DIAGNOSIS — J309 Allergic rhinitis, unspecified: Secondary | ICD-10-CM | POA: Diagnosis not present

## 2013-11-27 DIAGNOSIS — J3089 Other allergic rhinitis: Secondary | ICD-10-CM | POA: Diagnosis not present

## 2013-11-28 ENCOUNTER — Ambulatory Visit (INDEPENDENT_AMBULATORY_CARE_PROVIDER_SITE_OTHER): Payer: Medicare Other

## 2013-11-28 DIAGNOSIS — Z23 Encounter for immunization: Secondary | ICD-10-CM | POA: Diagnosis not present

## 2013-12-02 DIAGNOSIS — J3089 Other allergic rhinitis: Secondary | ICD-10-CM | POA: Diagnosis not present

## 2013-12-09 DIAGNOSIS — J3089 Other allergic rhinitis: Secondary | ICD-10-CM | POA: Diagnosis not present

## 2013-12-16 DIAGNOSIS — J3089 Other allergic rhinitis: Secondary | ICD-10-CM | POA: Diagnosis not present

## 2013-12-23 DIAGNOSIS — N952 Postmenopausal atrophic vaginitis: Secondary | ICD-10-CM | POA: Diagnosis not present

## 2013-12-23 DIAGNOSIS — Z1389 Encounter for screening for other disorder: Secondary | ICD-10-CM | POA: Diagnosis not present

## 2013-12-23 DIAGNOSIS — N393 Stress incontinence (female) (male): Secondary | ICD-10-CM | POA: Diagnosis not present

## 2013-12-23 DIAGNOSIS — Z01419 Encounter for gynecological examination (general) (routine) without abnormal findings: Secondary | ICD-10-CM | POA: Diagnosis not present

## 2013-12-23 DIAGNOSIS — Z0000 Encounter for general adult medical examination without abnormal findings: Secondary | ICD-10-CM | POA: Diagnosis not present

## 2013-12-25 DIAGNOSIS — J3089 Other allergic rhinitis: Secondary | ICD-10-CM | POA: Diagnosis not present

## 2013-12-30 DIAGNOSIS — J3089 Other allergic rhinitis: Secondary | ICD-10-CM | POA: Diagnosis not present

## 2014-01-06 DIAGNOSIS — J3089 Other allergic rhinitis: Secondary | ICD-10-CM | POA: Diagnosis not present

## 2014-01-08 DIAGNOSIS — D229 Melanocytic nevi, unspecified: Secondary | ICD-10-CM | POA: Diagnosis not present

## 2014-01-08 DIAGNOSIS — L821 Other seborrheic keratosis: Secondary | ICD-10-CM | POA: Diagnosis not present

## 2014-01-08 DIAGNOSIS — Z1283 Encounter for screening for malignant neoplasm of skin: Secondary | ICD-10-CM | POA: Diagnosis not present

## 2014-01-08 DIAGNOSIS — L578 Other skin changes due to chronic exposure to nonionizing radiation: Secondary | ICD-10-CM | POA: Diagnosis not present

## 2014-01-08 DIAGNOSIS — L820 Inflamed seborrheic keratosis: Secondary | ICD-10-CM | POA: Diagnosis not present

## 2014-01-08 DIAGNOSIS — D1800 Hemangioma unspecified site: Secondary | ICD-10-CM | POA: Diagnosis not present

## 2014-01-13 DIAGNOSIS — J3089 Other allergic rhinitis: Secondary | ICD-10-CM | POA: Diagnosis not present

## 2014-01-20 DIAGNOSIS — J3089 Other allergic rhinitis: Secondary | ICD-10-CM | POA: Diagnosis not present

## 2014-01-26 DIAGNOSIS — L821 Other seborrheic keratosis: Secondary | ICD-10-CM | POA: Diagnosis not present

## 2014-01-26 DIAGNOSIS — L820 Inflamed seborrheic keratosis: Secondary | ICD-10-CM | POA: Diagnosis not present

## 2014-01-26 DIAGNOSIS — L578 Other skin changes due to chronic exposure to nonionizing radiation: Secondary | ICD-10-CM | POA: Diagnosis not present

## 2014-01-27 DIAGNOSIS — J3089 Other allergic rhinitis: Secondary | ICD-10-CM | POA: Diagnosis not present

## 2014-02-03 DIAGNOSIS — J3089 Other allergic rhinitis: Secondary | ICD-10-CM | POA: Diagnosis not present

## 2014-02-06 ENCOUNTER — Other Ambulatory Visit: Payer: Self-pay

## 2014-02-06 MED ORDER — CARVEDILOL 12.5 MG PO TABS: ORAL_TABLET | ORAL | Status: DC

## 2014-02-10 DIAGNOSIS — J3089 Other allergic rhinitis: Secondary | ICD-10-CM | POA: Diagnosis not present

## 2014-02-17 DIAGNOSIS — J3089 Other allergic rhinitis: Secondary | ICD-10-CM | POA: Diagnosis not present

## 2014-02-24 DIAGNOSIS — J3089 Other allergic rhinitis: Secondary | ICD-10-CM | POA: Diagnosis not present

## 2014-02-26 ENCOUNTER — Encounter: Payer: Self-pay | Admitting: Internal Medicine

## 2014-03-03 DIAGNOSIS — J3089 Other allergic rhinitis: Secondary | ICD-10-CM | POA: Diagnosis not present

## 2014-03-10 DIAGNOSIS — J3089 Other allergic rhinitis: Secondary | ICD-10-CM | POA: Diagnosis not present

## 2014-03-19 DIAGNOSIS — J3089 Other allergic rhinitis: Secondary | ICD-10-CM | POA: Diagnosis not present

## 2014-03-24 DIAGNOSIS — J3089 Other allergic rhinitis: Secondary | ICD-10-CM | POA: Diagnosis not present

## 2014-03-31 DIAGNOSIS — J3089 Other allergic rhinitis: Secondary | ICD-10-CM | POA: Diagnosis not present

## 2014-04-07 DIAGNOSIS — J3089 Other allergic rhinitis: Secondary | ICD-10-CM | POA: Diagnosis not present

## 2014-04-16 DIAGNOSIS — J3089 Other allergic rhinitis: Secondary | ICD-10-CM | POA: Diagnosis not present

## 2014-04-21 DIAGNOSIS — J3089 Other allergic rhinitis: Secondary | ICD-10-CM | POA: Diagnosis not present

## 2014-04-29 ENCOUNTER — Other Ambulatory Visit: Payer: Self-pay | Admitting: Internal Medicine

## 2014-04-30 DIAGNOSIS — J3089 Other allergic rhinitis: Secondary | ICD-10-CM | POA: Diagnosis not present

## 2014-05-01 ENCOUNTER — Other Ambulatory Visit: Payer: Self-pay

## 2014-05-01 MED ORDER — CARVEDILOL 12.5 MG PO TABS: ORAL_TABLET | ORAL | Status: DC

## 2014-05-02 ENCOUNTER — Other Ambulatory Visit: Payer: Self-pay | Admitting: Internal Medicine

## 2014-05-05 DIAGNOSIS — J3089 Other allergic rhinitis: Secondary | ICD-10-CM | POA: Diagnosis not present

## 2014-05-14 DIAGNOSIS — J3089 Other allergic rhinitis: Secondary | ICD-10-CM | POA: Diagnosis not present

## 2014-05-19 ENCOUNTER — Ambulatory Visit: Payer: Medicare Other | Admitting: Internal Medicine

## 2014-05-19 DIAGNOSIS — J3089 Other allergic rhinitis: Secondary | ICD-10-CM | POA: Diagnosis not present

## 2014-05-20 ENCOUNTER — Other Ambulatory Visit (INDEPENDENT_AMBULATORY_CARE_PROVIDER_SITE_OTHER): Payer: Medicare Other

## 2014-05-20 ENCOUNTER — Telehealth: Payer: Self-pay

## 2014-05-20 ENCOUNTER — Encounter: Payer: Self-pay | Admitting: Internal Medicine

## 2014-05-20 ENCOUNTER — Ambulatory Visit (INDEPENDENT_AMBULATORY_CARE_PROVIDER_SITE_OTHER): Payer: Medicare Other | Admitting: Internal Medicine

## 2014-05-20 VITALS — BP 152/90 | HR 63 | Temp 97.9°F | Ht 63.0 in | Wt 131.5 lb

## 2014-05-20 DIAGNOSIS — I10 Essential (primary) hypertension: Secondary | ICD-10-CM

## 2014-05-20 DIAGNOSIS — R7309 Other abnormal glucose: Secondary | ICD-10-CM | POA: Diagnosis not present

## 2014-05-20 DIAGNOSIS — Z860100 Personal history of colon polyps, unspecified: Secondary | ICD-10-CM

## 2014-05-20 DIAGNOSIS — E782 Mixed hyperlipidemia: Secondary | ICD-10-CM | POA: Diagnosis not present

## 2014-05-20 LAB — LIPID PANEL
CHOL/HDL RATIO: 4
CHOLESTEROL: 164 mg/dL (ref 0–200)
HDL: 42.9 mg/dL (ref >39.00)
LDL CALC: 82 mg/dL (ref 0–99)
NonHDL: 121.1
TRIGLYCERIDES: 195 mg/dL — AB (ref 0.0–149.0)
VLDL: 39 mg/dL (ref 0.0–40.0)

## 2014-05-20 LAB — BASIC METABOLIC PANEL
BUN: 16 mg/dL (ref 6–23)
CHLORIDE: 98 meq/L (ref 96–112)
CO2: 32 mEq/L (ref 19–32)
Calcium: 9.6 mg/dL (ref 8.4–10.5)
Creatinine, Ser: 0.73 mg/dL (ref 0.40–1.20)
GFR: 82.6 mL/min (ref >60.00)
GLUCOSE: 126 mg/dL — AB (ref 70–99)
POTASSIUM: 3.8 meq/L (ref 3.5–5.1)
SODIUM: 138 meq/L (ref 135–145)

## 2014-05-20 LAB — CBC WITH DIFFERENTIAL/PLATELET
BASOS PCT: 1 % (ref 0.0–3.0)
Basophils Absolute: 0 10*3/uL (ref 0.0–0.1)
EOS PCT: 4 % (ref 0.0–5.0)
Eosinophils Absolute: 0.2 10*3/uL (ref 0.0–0.7)
HCT: 37.5 % (ref 36.0–46.0)
HEMOGLOBIN: 12.9 g/dL (ref 12.0–15.0)
LYMPHS ABS: 1.8 10*3/uL (ref 0.7–4.0)
Lymphocytes Relative: 44.9 % (ref 12.0–46.0)
MCHC: 34.6 g/dL (ref 30.0–36.0)
MCV: 90.2 fl (ref 78.0–100.0)
MONO ABS: 0.5 10*3/uL (ref 0.1–1.0)
MONOS PCT: 11.3 % (ref 3.0–12.0)
NEUTROS ABS: 1.6 10*3/uL (ref 1.4–7.7)
Neutrophils Relative %: 38.8 % — ABNORMAL LOW (ref 43.0–77.0)
Platelets: 190 10*3/uL (ref 150.0–400.0)
RBC: 4.15 Mil/uL (ref 3.87–5.11)
RDW: 13.9 % (ref 11.5–15.5)
WBC: 4.1 10*3/uL (ref 4.0–10.5)

## 2014-05-20 LAB — HEPATIC FUNCTION PANEL
ALBUMIN: 4.3 g/dL (ref 3.5–5.2)
ALK PHOS: 36 U/L — AB (ref 39–117)
ALT: 24 U/L (ref 0–35)
AST: 25 U/L (ref 0–37)
Bilirubin, Direct: 0.2 mg/dL (ref 0.0–0.3)
TOTAL PROTEIN: 7 g/dL (ref 6.0–8.3)
Total Bilirubin: 0.8 mg/dL (ref 0.2–1.2)

## 2014-05-20 LAB — HEMOGLOBIN A1C: HEMOGLOBIN A1C: 5.9 % (ref 4.6–6.5)

## 2014-05-20 LAB — TSH: TSH: 4.6 u[IU]/mL — ABNORMAL HIGH (ref 0.35–4.50)

## 2014-05-20 NOTE — Assessment & Plan Note (Signed)
CBC

## 2014-05-20 NOTE — Assessment & Plan Note (Signed)
Lipids, LFTs, TSH  

## 2014-05-20 NOTE — Assessment & Plan Note (Signed)
Blood pressure goals reviewed. BMET 

## 2014-05-20 NOTE — Progress Notes (Signed)
Subjective:    Patient ID: [name], female    DOB: 12-Mar-1940, 74 y.o.   MRN: [id number]  HPI The patient is here to assess status of active health conditions.  PMH, FH, & Social History reviewed & updated.  She is on a heart healthy diet. She's been compliant with her medications without adverse effects. She walks on a treadmill 2 times per week for 20 minutes without cardiopulmonary symptoms. She's also physically active in the house.  BP @ home averages 156/57.  She has had occasional dysphagia as infrequently as once a month and up to once a week. There is no definite pattern to this. She has no other GI symptoms. Her colonoscopy is not due until 2020.  She has had some pain in the right knee which has responded to supplementation with grape juice and Certo.   Review of Systems   Significant headaches, epistaxis, chest pain, palpitations, exertional dyspnea, claudication, paroxysmal nocturnal dyspnea, or edema absent. No myalgias or memory issues. No GI symptoms , memory loss or myalgias  Unexplained weight loss, abdominal pain, significant dyspepsia, melena, rectal bleeding, or persistently small caliber stools are denied.     Objective:   Physical Exam  Gen.: Adequately nourished in appearance. Alert, appropriate and cooperative throughout exam.  Appears younger than stated age  Head: Normocephalic without obvious abnormalities  Eyes: No corneal or conjunctival inflammation noted. Pupils equal round reactive to light and accommodation. Extraocular motion intact.  Ears: External  ear exam reveals no significant lesions or deformities. Canals clear .TMs normal. Hearing is grossly normal bilaterally. Nose: External nasal exam reveals no deformity or inflammation. Nasal mucosa are pink and moist. No lesions or exudates noted.  Minimal septal dislocation to R Mouth: Oral mucosa and oropharynx reveal no lesions or exudates. Teeth in good repair. Neck: No deformities,  masses, or tenderness noted. Range of motion & Thyroid normal Lungs: Normal respiratory effort; chest expands symmetrically. Lungs are clear to auscultation without rales, wheezes, or increased work of breathing. Heart: Normal rate and rhythm. Normal S1 and S2. No gallop, click, or rub. Grade 9-7.9 /6 systolic murmur @ R base with R carotid radiation Abdomen: Bowel sounds normal; abdomen soft and nontender. No masses, organomegaly or hernias noted. Genitalia: as per Gyn                                  Musculoskeletal/extremities: No deformity or scoliosis noted of  the thoracic or lumbar spine.  No clubbing, cyanosis, edema, or significant extremity  deformity noted.  Range of motion normal . Tone & strength normal. Hand joints reveal minor isolated  DJD DIP changes.  Fingernail  health good. Crepitus of knees  Able to lie down & sit up w/o help.  Negative SLR bilaterally Vascular: Carotid, radial artery, dorsalis pedis and  posterior tibial pulses are full and equal. No bruits present. Neurologic: Alert and oriented x3. Deep tendon reflexes symmetrical and normal.  Gait normal       Skin: Intact without suspicious lesions or rashes. Lymph: No cervical, axillary lymphadenopathy present. Psych: Mood and affect are normal. Normally interactive  Assessment & Plan:  See Current Assessment & Plan in Problem List under specific Diagnosis

## 2014-05-20 NOTE — Progress Notes (Signed)
Pre visit review using our clinic review tool, if applicable. No additional management support is needed unless otherwise documented below in the visit note. 

## 2014-05-20 NOTE — Telephone Encounter (Signed)
-----   Message from Hendricks Limes, MD sent at 05/20/2014  1:27 PM EDT ----- Please add A1c (R73.9)

## 2014-05-20 NOTE — Telephone Encounter (Signed)
Request for a1c add on has been faxed to [facility] lab

## 2014-05-20 NOTE — Patient Instructions (Addendum)
  Your next office appointment will be determined based upon review of your pending labs Those instructions will be transmitted to you by mail. Critical values will be called.   Followup as needed for any active or acute issue. Please report any significant change in your symptoms.  Minimal Blood Pressure Goal= AVERAGE < 140/90;  Ideal is an AVERAGE < 135/85. This AVERAGE should be calculated from @ least 5-7 BP readings taken @ different times of day on different days of week. You should not respond to isolated BP readings , but rather the AVERAGE for that week .Please bring your  blood pressure cuff to office visits to verify that it is reliable.It  can also be checked against the blood pressure device at the pharmacy. Finger or wrist cuffs are not dependable; an arm cuff is.  Reflux of gastric acid may be asymptomatic as this may occur mainly during sleep.The triggers for reflux  include stress; the "aspirin family" ; alcohol; peppermint; and caffeine (coffee, tea, cola, and chocolate). The aspirin family would include aspirin and the nonsteroidal agents such as ibuprofen &  Naproxen. Tylenol would not cause reflux. If having symptoms ; food & drink should be avoided for @ least 2 hours before going to bed.

## 2014-05-22 ENCOUNTER — Other Ambulatory Visit: Payer: Self-pay | Admitting: Internal Medicine

## 2014-05-22 DIAGNOSIS — R946 Abnormal results of thyroid function studies: Secondary | ICD-10-CM

## 2014-05-22 NOTE — Progress Notes (Signed)
   Subjective:    Patient ID: [name], female    DOB: 08/18/1940, 74 y.o.   MRN: [id number]  HPI    Review of Systems     Objective:   Physical Exam        Assessment & Plan:

## 2014-05-26 DIAGNOSIS — J3089 Other allergic rhinitis: Secondary | ICD-10-CM | POA: Diagnosis not present

## 2014-05-29 LAB — HM MAMMOGRAPHY: HM MAMMO: NORMAL (ref 0–4)

## 2014-06-02 DIAGNOSIS — J3089 Other allergic rhinitis: Secondary | ICD-10-CM | POA: Diagnosis not present

## 2014-06-04 ENCOUNTER — Other Ambulatory Visit: Payer: Self-pay | Admitting: Internal Medicine

## 2014-06-04 MED ORDER — HYDROCHLOROTHIAZIDE 12.5 MG PO CAPS: 12.5000 mg | ORAL_CAPSULE | Freq: Every day | ORAL | Status: DC

## 2014-06-04 NOTE — Telephone Encounter (Signed)
Advised pt refill request sent to pharmacy

## 2014-06-05 DIAGNOSIS — J3089 Other allergic rhinitis: Secondary | ICD-10-CM | POA: Diagnosis not present

## 2014-06-08 ENCOUNTER — Telehealth: Payer: Self-pay | Admitting: Internal Medicine

## 2014-06-08 NOTE — Telephone Encounter (Signed)
Patient called regarding her lab work that needs to be done. I told her that there are orders in there for her and she is concerned on whether she needs to fast or not. Please advise

## 2014-06-08 NOTE — Telephone Encounter (Signed)
Called and left message, pt does not have to fast for t3,t4,tsh labs already ordered, labs are ordered and will not expire until 06/27/14, advised pt she can come to lab anytime between lab hours, no appt needed, can call back if further questions

## 2014-06-09 DIAGNOSIS — J3089 Other allergic rhinitis: Secondary | ICD-10-CM | POA: Diagnosis not present

## 2014-06-14 ENCOUNTER — Other Ambulatory Visit: Payer: Self-pay | Admitting: Internal Medicine

## 2014-06-16 DIAGNOSIS — J3089 Other allergic rhinitis: Secondary | ICD-10-CM | POA: Diagnosis not present

## 2014-06-18 ENCOUNTER — Other Ambulatory Visit (INDEPENDENT_AMBULATORY_CARE_PROVIDER_SITE_OTHER): Payer: Medicare Other

## 2014-06-18 DIAGNOSIS — Z1231 Encounter for screening mammogram for malignant neoplasm of breast: Secondary | ICD-10-CM | POA: Diagnosis not present

## 2014-06-18 DIAGNOSIS — R946 Abnormal results of thyroid function studies: Secondary | ICD-10-CM

## 2014-06-18 LAB — T4, FREE: Free T4: 0.83 ng/dL (ref 0.60–1.60)

## 2014-06-18 LAB — T3, FREE: T3, Free: 2.9 pg/mL (ref 2.3–4.2)

## 2014-06-18 LAB — TSH: TSH: 2.56 u[IU]/mL (ref 0.35–4.50)

## 2014-06-23 DIAGNOSIS — J3089 Other allergic rhinitis: Secondary | ICD-10-CM | POA: Diagnosis not present

## 2014-06-30 DIAGNOSIS — J3089 Other allergic rhinitis: Secondary | ICD-10-CM | POA: Diagnosis not present

## 2014-07-07 DIAGNOSIS — J3089 Other allergic rhinitis: Secondary | ICD-10-CM | POA: Diagnosis not present

## 2014-07-16 DIAGNOSIS — J3089 Other allergic rhinitis: Secondary | ICD-10-CM | POA: Diagnosis not present

## 2014-07-20 ENCOUNTER — Other Ambulatory Visit: Payer: Self-pay

## 2014-07-20 ENCOUNTER — Telehealth: Payer: Self-pay | Admitting: Internal Medicine

## 2014-07-20 DIAGNOSIS — I10 Essential (primary) hypertension: Secondary | ICD-10-CM

## 2014-07-20 MED ORDER — CARVEDILOL 12.5 MG PO TABS: ORAL_TABLET | ORAL | Status: DC

## 2014-07-20 NOTE — Telephone Encounter (Signed)
Patient is requesting a 90 day prescription for [facility] [address] in [place]

## 2014-07-20 NOTE — Telephone Encounter (Signed)
rx sent, patient advised

## 2014-07-21 DIAGNOSIS — J3089 Other allergic rhinitis: Secondary | ICD-10-CM | POA: Diagnosis not present

## 2014-07-30 DIAGNOSIS — J3089 Other allergic rhinitis: Secondary | ICD-10-CM | POA: Diagnosis not present

## 2014-08-04 DIAGNOSIS — J3089 Other allergic rhinitis: Secondary | ICD-10-CM | POA: Diagnosis not present

## 2014-08-11 DIAGNOSIS — J3089 Other allergic rhinitis: Secondary | ICD-10-CM | POA: Diagnosis not present

## 2014-08-20 DIAGNOSIS — J3089 Other allergic rhinitis: Secondary | ICD-10-CM | POA: Diagnosis not present

## 2014-08-27 DIAGNOSIS — J3089 Other allergic rhinitis: Secondary | ICD-10-CM | POA: Diagnosis not present

## 2014-09-03 DIAGNOSIS — J3089 Other allergic rhinitis: Secondary | ICD-10-CM | POA: Diagnosis not present

## 2014-09-08 DIAGNOSIS — T1502XA Foreign body in cornea, left eye, initial encounter: Secondary | ICD-10-CM | POA: Diagnosis not present

## 2014-09-08 DIAGNOSIS — H2513 Age-related nuclear cataract, bilateral: Secondary | ICD-10-CM | POA: Diagnosis not present

## 2014-09-08 DIAGNOSIS — H1851 Endothelial corneal dystrophy: Secondary | ICD-10-CM | POA: Diagnosis not present

## 2014-09-08 DIAGNOSIS — H5203 Hypermetropia, bilateral: Secondary | ICD-10-CM | POA: Diagnosis not present

## 2014-09-10 DIAGNOSIS — J3089 Other allergic rhinitis: Secondary | ICD-10-CM | POA: Diagnosis not present

## 2014-09-17 DIAGNOSIS — J3089 Other allergic rhinitis: Secondary | ICD-10-CM | POA: Diagnosis not present

## 2014-09-24 DIAGNOSIS — J3089 Other allergic rhinitis: Secondary | ICD-10-CM | POA: Diagnosis not present

## 2014-09-29 DIAGNOSIS — J3089 Other allergic rhinitis: Secondary | ICD-10-CM | POA: Diagnosis not present

## 2014-10-04 DIAGNOSIS — H02849 Edema of unspecified eye, unspecified eyelid: Secondary | ICD-10-CM | POA: Diagnosis not present

## 2014-10-08 ENCOUNTER — Telehealth: Payer: Self-pay | Admitting: *Deleted

## 2014-10-08 ENCOUNTER — Other Ambulatory Visit: Payer: Self-pay | Admitting: Internal Medicine

## 2014-10-08 DIAGNOSIS — I10 Essential (primary) hypertension: Secondary | ICD-10-CM

## 2014-10-08 MED ORDER — CARVEDILOL 12.5 MG PO TABS: ORAL_TABLET | ORAL | Status: DC

## 2014-10-08 NOTE — Telephone Encounter (Signed)
Pt states md in crease her carvedilol to 1 1/2 tab twice a day at her last visit. The pharmacy is needing new script with correct quantity. Verified pharmacy inform pt [facility].../lmb

## 2014-10-13 DIAGNOSIS — J3089 Other allergic rhinitis: Secondary | ICD-10-CM | POA: Diagnosis not present

## 2014-10-20 DIAGNOSIS — J3089 Other allergic rhinitis: Secondary | ICD-10-CM | POA: Diagnosis not present

## 2014-10-22 DIAGNOSIS — H25812 Combined forms of age-related cataract, left eye: Secondary | ICD-10-CM | POA: Diagnosis not present

## 2014-10-22 DIAGNOSIS — H2512 Age-related nuclear cataract, left eye: Secondary | ICD-10-CM | POA: Diagnosis not present

## 2014-10-27 DIAGNOSIS — J3089 Other allergic rhinitis: Secondary | ICD-10-CM | POA: Diagnosis not present

## 2014-11-03 DIAGNOSIS — J3089 Other allergic rhinitis: Secondary | ICD-10-CM | POA: Diagnosis not present

## 2014-11-03 DIAGNOSIS — H1045 Other chronic allergic conjunctivitis: Secondary | ICD-10-CM | POA: Diagnosis not present

## 2014-11-03 DIAGNOSIS — J3081 Allergic rhinitis due to animal (cat) (dog) hair and dander: Secondary | ICD-10-CM | POA: Diagnosis not present

## 2014-11-10 DIAGNOSIS — J3089 Other allergic rhinitis: Secondary | ICD-10-CM | POA: Diagnosis not present

## 2014-11-12 ENCOUNTER — Ambulatory Visit (INDEPENDENT_AMBULATORY_CARE_PROVIDER_SITE_OTHER): Payer: Medicare Other | Admitting: Internal Medicine

## 2014-11-12 ENCOUNTER — Encounter: Payer: Self-pay | Admitting: Internal Medicine

## 2014-11-12 VITALS — BP 140/80 | HR 58 | Temp 97.7°F | Resp 16 | Wt 129.0 lb

## 2014-11-12 DIAGNOSIS — T63441A Toxic effect of venom of bees, accidental (unintentional), initial encounter: Secondary | ICD-10-CM | POA: Diagnosis not present

## 2014-11-12 MED ORDER — PREDNISONE 10 MG PO TABS: ORAL_TABLET | ORAL | Status: DC

## 2014-11-12 MED ORDER — HYDROXYZINE HCL 10 MG PO TABS: 10.0000 mg | ORAL_TABLET | Freq: Three times a day (TID) | ORAL | Status: DC | PRN

## 2014-11-12 NOTE — Progress Notes (Signed)
   Subjective:    Patient ID: [name], female    DOB: 03/29/1940, 74 y.o.   MRN: [id number]  HPI   Since August she's been stung twice on the face by a bee The initial episode was 10/03/14 on the bridge of the nose. At that time she had swelling around both eyes.  Yesterday she was stung above the left eyebrow and has developed severe swelling around the left eye.  She's been using Benadryl 1-2 several times a day as needed.  She had no associated extrinsic symptoms otherwise.  She did have cataract surgery on the left 8/25 and his been using a steroid eyedrop. She will continue this until 11/21/14.    Review of Systems   She describes some itching in the left eye and she has had some clear drainage from the eyes but no purulence. She's had no fever, chills, or sweats.  She denies sneezing, nasal discharge, angioedema, shortness of breath, or wheezing.     Objective:   Physical Exam   She has marked edema of the upper & lower lids on the left with faint erythema. There is no evidence of conjunctivitis. Extraocular motion is intact as is vision to confrontation.  General appearance:Adequately nourished; no acute distress or increased work of breathing is present.    Lymphatic: No  lymphadenopathy about the head, neck, or axilla .  Ears:  External ear exam shows no significant lesions or deformities.  Otoscopic examination reveals clear canals, tympanic membranes are intact bilaterally without bulging, retraction, inflammation or discharge.  Nose:  External nasal examination shows no deformity or inflammation. Nasal mucosa are pink and moist without lesions or exudates No septal dislocation or deviation.No obstruction to airflow.   Oral exam: Dental hygiene is good; lips and gums are healthy appearing.There is no oropharyngeal erythema or exudate .  Neck:  No deformities, thyromegaly, masses, or tenderness noted.   Supple with full range of motion without pain.   Heart:   Normal rate and regular rhythm. S1 and S2 normal without gallop, murmur, click, rub or other extra sounds.   Lungs:Chest clear to auscultation; no wheezes, rhonchi,rales ,or rubs present.  Extremities:  No cyanosis, edema, or clubbing  noted    Skin: Warm & dry w/o tenting or jaundice. No significant lesions or rash.     Assessment & Plan:  #1 bee sting left face with localized periorbital swelling. No evidence of anaphylaxis.  Plan: See orders and after visit summary.

## 2014-11-12 NOTE — Progress Notes (Signed)
Pre visit review using our clinic review tool, if applicable. No additional management support is needed unless otherwise documented below in the visit note. 

## 2014-11-12 NOTE — Patient Instructions (Signed)
Cool compresses 3 X/ day as discussed to your left eye.  Use saline drops liberally through the day to cleanse the eye.  Please consider the bee keepers hat with a veil when out in the yard.

## 2014-11-19 DIAGNOSIS — M7061 Trochanteric bursitis, right hip: Secondary | ICD-10-CM | POA: Diagnosis not present

## 2014-11-23 DIAGNOSIS — M25551 Pain in right hip: Secondary | ICD-10-CM | POA: Diagnosis not present

## 2014-11-23 DIAGNOSIS — M7061 Trochanteric bursitis, right hip: Secondary | ICD-10-CM | POA: Diagnosis not present

## 2014-11-24 DIAGNOSIS — J3089 Other allergic rhinitis: Secondary | ICD-10-CM | POA: Diagnosis not present

## 2014-11-25 ENCOUNTER — Other Ambulatory Visit: Payer: Self-pay | Admitting: Internal Medicine

## 2014-11-25 DIAGNOSIS — M25551 Pain in right hip: Secondary | ICD-10-CM | POA: Diagnosis not present

## 2014-11-25 DIAGNOSIS — M7061 Trochanteric bursitis, right hip: Secondary | ICD-10-CM | POA: Diagnosis not present

## 2014-11-25 NOTE — Telephone Encounter (Signed)
Pt called in said that she had pharmacy fax over a 90 day supply refill request for hydrochlorothiazide (MICROZIDE) 12.5 MG capsule [[id number]]  Per her ins they will only pay for the 90day supply

## 2014-11-26 ENCOUNTER — Other Ambulatory Visit: Payer: Self-pay | Admitting: Emergency Medicine

## 2014-11-26 DIAGNOSIS — H1851 Endothelial corneal dystrophy: Secondary | ICD-10-CM | POA: Diagnosis not present

## 2014-11-26 DIAGNOSIS — H2511 Age-related nuclear cataract, right eye: Secondary | ICD-10-CM | POA: Diagnosis not present

## 2014-11-26 DIAGNOSIS — H25811 Combined forms of age-related cataract, right eye: Secondary | ICD-10-CM | POA: Diagnosis not present

## 2014-11-26 MED ORDER — HYDROCHLOROTHIAZIDE 12.5 MG PO CAPS: 12.5000 mg | ORAL_CAPSULE | Freq: Every day | ORAL | Status: DC

## 2014-11-28 DIAGNOSIS — M25551 Pain in right hip: Secondary | ICD-10-CM | POA: Diagnosis not present

## 2014-11-28 DIAGNOSIS — M7061 Trochanteric bursitis, right hip: Secondary | ICD-10-CM | POA: Diagnosis not present

## 2014-12-01 ENCOUNTER — Other Ambulatory Visit: Payer: Self-pay | Admitting: Emergency Medicine

## 2014-12-01 DIAGNOSIS — M25551 Pain in right hip: Secondary | ICD-10-CM | POA: Diagnosis not present

## 2014-12-01 DIAGNOSIS — M7061 Trochanteric bursitis, right hip: Secondary | ICD-10-CM | POA: Diagnosis not present

## 2014-12-01 DIAGNOSIS — J3089 Other allergic rhinitis: Secondary | ICD-10-CM | POA: Diagnosis not present

## 2014-12-01 MED ORDER — HYDROCHLOROTHIAZIDE 12.5 MG PO CAPS
12.5000 mg | ORAL_CAPSULE | Freq: Every day | ORAL | Status: DC
Start: 2014-12-01 — End: 2015-05-28

## 2014-12-03 DIAGNOSIS — M25551 Pain in right hip: Secondary | ICD-10-CM | POA: Diagnosis not present

## 2014-12-03 DIAGNOSIS — M7061 Trochanteric bursitis, right hip: Secondary | ICD-10-CM | POA: Diagnosis not present

## 2014-12-04 ENCOUNTER — Ambulatory Visit (INDEPENDENT_AMBULATORY_CARE_PROVIDER_SITE_OTHER): Payer: Medicare Other

## 2014-12-04 DIAGNOSIS — Z23 Encounter for immunization: Secondary | ICD-10-CM

## 2014-12-07 DIAGNOSIS — M25551 Pain in right hip: Secondary | ICD-10-CM | POA: Diagnosis not present

## 2014-12-07 DIAGNOSIS — M7061 Trochanteric bursitis, right hip: Secondary | ICD-10-CM | POA: Diagnosis not present

## 2014-12-11 DIAGNOSIS — M25551 Pain in right hip: Secondary | ICD-10-CM | POA: Diagnosis not present

## 2014-12-11 DIAGNOSIS — M7061 Trochanteric bursitis, right hip: Secondary | ICD-10-CM | POA: Diagnosis not present

## 2014-12-14 DIAGNOSIS — M25551 Pain in right hip: Secondary | ICD-10-CM | POA: Diagnosis not present

## 2014-12-14 DIAGNOSIS — M7061 Trochanteric bursitis, right hip: Secondary | ICD-10-CM | POA: Diagnosis not present

## 2014-12-15 ENCOUNTER — Encounter: Payer: Self-pay | Admitting: Internal Medicine

## 2014-12-15 DIAGNOSIS — J3089 Other allergic rhinitis: Secondary | ICD-10-CM | POA: Diagnosis not present

## 2014-12-17 DIAGNOSIS — M7061 Trochanteric bursitis, right hip: Secondary | ICD-10-CM | POA: Diagnosis not present

## 2014-12-17 DIAGNOSIS — M25551 Pain in right hip: Secondary | ICD-10-CM | POA: Diagnosis not present

## 2014-12-21 DIAGNOSIS — M25551 Pain in right hip: Secondary | ICD-10-CM | POA: Diagnosis not present

## 2014-12-21 DIAGNOSIS — M7061 Trochanteric bursitis, right hip: Secondary | ICD-10-CM | POA: Diagnosis not present

## 2014-12-22 DIAGNOSIS — J3089 Other allergic rhinitis: Secondary | ICD-10-CM | POA: Diagnosis not present

## 2014-12-23 DIAGNOSIS — M7061 Trochanteric bursitis, right hip: Secondary | ICD-10-CM | POA: Diagnosis not present

## 2014-12-23 DIAGNOSIS — M25551 Pain in right hip: Secondary | ICD-10-CM | POA: Diagnosis not present

## 2014-12-29 DIAGNOSIS — M25551 Pain in right hip: Secondary | ICD-10-CM | POA: Diagnosis not present

## 2014-12-29 DIAGNOSIS — M7061 Trochanteric bursitis, right hip: Secondary | ICD-10-CM | POA: Diagnosis not present

## 2014-12-30 DIAGNOSIS — Z6822 Body mass index (BMI) 22.0-22.9, adult: Secondary | ICD-10-CM | POA: Diagnosis not present

## 2014-12-30 DIAGNOSIS — Z130 Encounter for screening for diseases of the blood and blood-forming organs and certain disorders involving the immune mechanism: Secondary | ICD-10-CM | POA: Diagnosis not present

## 2014-12-30 DIAGNOSIS — Z01419 Encounter for gynecological examination (general) (routine) without abnormal findings: Secondary | ICD-10-CM | POA: Diagnosis not present

## 2014-12-30 DIAGNOSIS — Z1389 Encounter for screening for other disorder: Secondary | ICD-10-CM | POA: Diagnosis not present

## 2014-12-30 DIAGNOSIS — N952 Postmenopausal atrophic vaginitis: Secondary | ICD-10-CM | POA: Diagnosis not present

## 2014-12-31 DIAGNOSIS — M7061 Trochanteric bursitis, right hip: Secondary | ICD-10-CM | POA: Diagnosis not present

## 2014-12-31 DIAGNOSIS — J3089 Other allergic rhinitis: Secondary | ICD-10-CM | POA: Diagnosis not present

## 2014-12-31 DIAGNOSIS — M25551 Pain in right hip: Secondary | ICD-10-CM | POA: Diagnosis not present

## 2015-01-05 DIAGNOSIS — M25551 Pain in right hip: Secondary | ICD-10-CM | POA: Diagnosis not present

## 2015-01-05 DIAGNOSIS — M7061 Trochanteric bursitis, right hip: Secondary | ICD-10-CM | POA: Diagnosis not present

## 2015-01-07 DIAGNOSIS — M25551 Pain in right hip: Secondary | ICD-10-CM | POA: Diagnosis not present

## 2015-01-07 DIAGNOSIS — J3089 Other allergic rhinitis: Secondary | ICD-10-CM | POA: Diagnosis not present

## 2015-01-07 DIAGNOSIS — M7061 Trochanteric bursitis, right hip: Secondary | ICD-10-CM | POA: Diagnosis not present

## 2015-01-12 DIAGNOSIS — M25551 Pain in right hip: Secondary | ICD-10-CM | POA: Diagnosis not present

## 2015-01-12 DIAGNOSIS — M7061 Trochanteric bursitis, right hip: Secondary | ICD-10-CM | POA: Diagnosis not present

## 2015-01-12 DIAGNOSIS — J3089 Other allergic rhinitis: Secondary | ICD-10-CM | POA: Diagnosis not present

## 2015-01-14 DIAGNOSIS — M25551 Pain in right hip: Secondary | ICD-10-CM | POA: Diagnosis not present

## 2015-01-14 DIAGNOSIS — M7061 Trochanteric bursitis, right hip: Secondary | ICD-10-CM | POA: Diagnosis not present

## 2015-01-18 DIAGNOSIS — M7061 Trochanteric bursitis, right hip: Secondary | ICD-10-CM | POA: Diagnosis not present

## 2015-01-18 DIAGNOSIS — M25551 Pain in right hip: Secondary | ICD-10-CM | POA: Diagnosis not present

## 2015-01-19 DIAGNOSIS — J3089 Other allergic rhinitis: Secondary | ICD-10-CM | POA: Diagnosis not present

## 2015-01-22 DIAGNOSIS — M25551 Pain in right hip: Secondary | ICD-10-CM | POA: Diagnosis not present

## 2015-01-22 DIAGNOSIS — M7061 Trochanteric bursitis, right hip: Secondary | ICD-10-CM | POA: Diagnosis not present

## 2015-01-26 DIAGNOSIS — M7061 Trochanteric bursitis, right hip: Secondary | ICD-10-CM | POA: Diagnosis not present

## 2015-01-26 DIAGNOSIS — J3089 Other allergic rhinitis: Secondary | ICD-10-CM | POA: Diagnosis not present

## 2015-01-26 DIAGNOSIS — M25551 Pain in right hip: Secondary | ICD-10-CM | POA: Diagnosis not present

## 2015-01-28 DIAGNOSIS — M7061 Trochanteric bursitis, right hip: Secondary | ICD-10-CM | POA: Diagnosis not present

## 2015-01-28 DIAGNOSIS — M25551 Pain in right hip: Secondary | ICD-10-CM | POA: Diagnosis not present

## 2015-01-29 DIAGNOSIS — M7061 Trochanteric bursitis, right hip: Secondary | ICD-10-CM | POA: Diagnosis not present

## 2015-01-29 DIAGNOSIS — M25551 Pain in right hip: Secondary | ICD-10-CM | POA: Diagnosis not present

## 2015-02-01 DIAGNOSIS — M25551 Pain in right hip: Secondary | ICD-10-CM | POA: Diagnosis not present

## 2015-02-01 DIAGNOSIS — M7061 Trochanteric bursitis, right hip: Secondary | ICD-10-CM | POA: Diagnosis not present

## 2015-02-02 DIAGNOSIS — J3089 Other allergic rhinitis: Secondary | ICD-10-CM | POA: Diagnosis not present

## 2015-02-04 DIAGNOSIS — M25551 Pain in right hip: Secondary | ICD-10-CM | POA: Diagnosis not present

## 2015-02-04 DIAGNOSIS — M7061 Trochanteric bursitis, right hip: Secondary | ICD-10-CM | POA: Diagnosis not present

## 2015-02-11 DIAGNOSIS — J3089 Other allergic rhinitis: Secondary | ICD-10-CM | POA: Diagnosis not present

## 2015-02-16 DIAGNOSIS — J3089 Other allergic rhinitis: Secondary | ICD-10-CM | POA: Diagnosis not present

## 2015-02-25 DIAGNOSIS — J3089 Other allergic rhinitis: Secondary | ICD-10-CM | POA: Diagnosis not present

## 2015-03-04 DIAGNOSIS — J3089 Other allergic rhinitis: Secondary | ICD-10-CM | POA: Diagnosis not present

## 2015-03-16 DIAGNOSIS — J3089 Other allergic rhinitis: Secondary | ICD-10-CM | POA: Diagnosis not present

## 2015-03-16 DIAGNOSIS — J301 Allergic rhinitis due to pollen: Secondary | ICD-10-CM | POA: Diagnosis not present

## 2015-03-25 DIAGNOSIS — J3089 Other allergic rhinitis: Secondary | ICD-10-CM | POA: Diagnosis not present

## 2015-04-01 DIAGNOSIS — J3089 Other allergic rhinitis: Secondary | ICD-10-CM | POA: Diagnosis not present

## 2015-04-06 DIAGNOSIS — J3089 Other allergic rhinitis: Secondary | ICD-10-CM | POA: Diagnosis not present

## 2015-04-08 DIAGNOSIS — J3089 Other allergic rhinitis: Secondary | ICD-10-CM | POA: Diagnosis not present

## 2015-04-08 DIAGNOSIS — J3081 Allergic rhinitis due to animal (cat) (dog) hair and dander: Secondary | ICD-10-CM | POA: Diagnosis not present

## 2015-04-08 DIAGNOSIS — J301 Allergic rhinitis due to pollen: Secondary | ICD-10-CM | POA: Diagnosis not present

## 2015-04-13 DIAGNOSIS — J3089 Other allergic rhinitis: Secondary | ICD-10-CM | POA: Diagnosis not present

## 2015-04-20 DIAGNOSIS — J3089 Other allergic rhinitis: Secondary | ICD-10-CM | POA: Diagnosis not present

## 2015-04-29 DIAGNOSIS — J3089 Other allergic rhinitis: Secondary | ICD-10-CM | POA: Diagnosis not present

## 2015-05-06 DIAGNOSIS — J3089 Other allergic rhinitis: Secondary | ICD-10-CM | POA: Diagnosis not present

## 2015-05-14 ENCOUNTER — Telehealth: Payer: Self-pay | Admitting: Internal Medicine

## 2015-05-14 NOTE — Telephone Encounter (Signed)
Patient scheduled appointment with Katha Cabal at 9:45.

## 2015-05-14 NOTE — Telephone Encounter (Signed)
LM for pt to schedule AWV at 9:45 on 05/28/15 before CPE with [email or web address] [email or web address]

## 2015-05-18 DIAGNOSIS — J3089 Other allergic rhinitis: Secondary | ICD-10-CM | POA: Diagnosis not present

## 2015-05-27 DIAGNOSIS — J3089 Other allergic rhinitis: Secondary | ICD-10-CM | POA: Diagnosis not present

## 2015-05-28 ENCOUNTER — Encounter: Payer: Self-pay | Admitting: Internal Medicine

## 2015-05-28 ENCOUNTER — Ambulatory Visit (INDEPENDENT_AMBULATORY_CARE_PROVIDER_SITE_OTHER): Payer: Medicare Other | Admitting: Internal Medicine

## 2015-05-28 ENCOUNTER — Ambulatory Visit (INDEPENDENT_AMBULATORY_CARE_PROVIDER_SITE_OTHER): Payer: Medicare Other

## 2015-05-28 VITALS — BP 138/70 | HR 55 | Temp 98.4°F | Ht 63.0 in | Wt 126.0 lb

## 2015-05-28 DIAGNOSIS — I10 Essential (primary) hypertension: Secondary | ICD-10-CM

## 2015-05-28 DIAGNOSIS — R7309 Other abnormal glucose: Secondary | ICD-10-CM

## 2015-05-28 DIAGNOSIS — Z0000 Encounter for general adult medical examination without abnormal findings: Secondary | ICD-10-CM | POA: Diagnosis not present

## 2015-05-28 DIAGNOSIS — E782 Mixed hyperlipidemia: Secondary | ICD-10-CM

## 2015-05-28 DIAGNOSIS — Z23 Encounter for immunization: Secondary | ICD-10-CM

## 2015-05-28 DIAGNOSIS — I2510 Atherosclerotic heart disease of native coronary artery without angina pectoris: Secondary | ICD-10-CM

## 2015-05-28 DIAGNOSIS — Z86010 Personal history of colonic polyps: Secondary | ICD-10-CM

## 2015-05-28 LAB — CBC WITH DIFFERENTIAL/PLATELET
BASOS PCT: 0.4 % (ref 0.0–3.0)
Basophils Absolute: 0 10*3/uL (ref 0.0–0.1)
Eosinophils Absolute: 0.1 10*3/uL (ref 0.0–0.7)
Eosinophils Relative: 2.8 % (ref 0.0–5.0)
HEMATOCRIT: 36.5 % (ref 36.0–46.0)
HEMOGLOBIN: 12.3 g/dL (ref 12.0–15.0)
LYMPHS PCT: 52.1 % — AB (ref 12.0–46.0)
Lymphs Abs: 2 10*3/uL (ref 0.7–4.0)
MCHC: 33.7 g/dL (ref 30.0–36.0)
MCV: 90.7 fl (ref 78.0–100.0)
MONOS PCT: 10.8 % (ref 3.0–12.0)
Monocytes Absolute: 0.4 10*3/uL (ref 0.1–1.0)
NEUTROS ABS: 1.3 10*3/uL — AB (ref 1.4–7.7)
Neutrophils Relative %: 33.9 % — ABNORMAL LOW (ref 43.0–77.0)
PLATELETS: 184 10*3/uL (ref 150.0–400.0)
RBC: 4.02 Mil/uL (ref 3.87–5.11)
RDW: 14.8 % (ref 11.5–15.5)
WBC: 3.8 10*3/uL — ABNORMAL LOW (ref 4.0–10.5)

## 2015-05-28 LAB — LIPID PANEL
CHOL/HDL RATIO: 3
Cholesterol: 151 mg/dL (ref 0–200)
HDL: 45.6 mg/dL (ref >39.00)
LDL CALC: 85 mg/dL (ref 0–99)
NonHDL: 105.5
TRIGLYCERIDES: 102 mg/dL (ref 0.0–149.0)
VLDL: 20.4 mg/dL (ref 0.0–40.0)

## 2015-05-28 LAB — COMPREHENSIVE METABOLIC PANEL
ALT: 20 U/L (ref 0–35)
AST: 21 U/L (ref 0–37)
Albumin: 4.4 g/dL (ref 3.5–5.2)
Alkaline Phosphatase: 35 U/L — ABNORMAL LOW (ref 39–117)
BUN: 20 mg/dL (ref 6–23)
CALCIUM: 9.9 mg/dL (ref 8.4–10.5)
CHLORIDE: 101 meq/L (ref 96–112)
CO2: 32 meq/L (ref 19–32)
Creatinine, Ser: 0.64 mg/dL (ref 0.40–1.20)
GFR: 95.88 mL/min (ref >60.00)
Glucose, Bld: 117 mg/dL — ABNORMAL HIGH (ref 70–99)
POTASSIUM: 3.9 meq/L (ref 3.5–5.1)
Sodium: 139 mEq/L (ref 135–145)
Total Bilirubin: 0.8 mg/dL (ref 0.2–1.2)
Total Protein: 7.1 g/dL (ref 6.0–8.3)

## 2015-05-28 LAB — T4, FREE: Free T4: 0.97 ng/dL (ref 0.60–1.60)

## 2015-05-28 LAB — HEMOGLOBIN A1C: Hgb A1c MFr Bld: 6 % (ref 4.6–6.5)

## 2015-05-28 MED ORDER — HYDROCHLOROTHIAZIDE 12.5 MG PO CAPS: 12.5000 mg | ORAL_CAPSULE | Freq: Every day | ORAL | Status: DC

## 2015-05-28 MED ORDER — PRAVASTATIN SODIUM 40 MG PO TABS: 40.0000 mg | ORAL_TABLET | Freq: Every day | ORAL | Status: DC

## 2015-05-28 MED ORDER — CARVEDILOL 12.5 MG PO TABS: ORAL_TABLET | ORAL | Status: DC

## 2015-05-28 NOTE — Patient Instructions (Signed)
Ms. Zeppieri , Thank you for taking time to come for your Medicare Wellness Visit. I appreciate your ongoing commitment to your health goals. Please review the following plan we discussed and let me know if I can assist you in the future.    This is a list of the screening recommended for you and due dates:  Health Maintenance  Topic Date Due  . DEXA scan (bone density measurement)  04/27/2016*  . Shingles Vaccine  04/27/2016*  . Flu Shot  09/28/2015  . Tetanus Vaccine  04/21/2019  . Pneumonia vaccines  Completed  *Topic was postponed. The date shown is not the original due date.   Preventive Care for Adults  A healthy lifestyle and preventive care can promote health and wellness. Preventive health guidelines for adults include the following key practices.  . A routine yearly physical is a good way to check with your health care provider about your health and preventive screening. It is a chance to share any concerns and updates on your health and to receive a thorough exam.  . Visit your dentist for a routine exam and preventive care every 6 months. Brush your teeth twice a day and floss once a day. Good oral hygiene prevents tooth decay and gum disease.  . The frequency of eye exams is based on your age, health, family medical history, use  of contact lenses, and other factors. Follow your health care provider's ecommendations for frequency of eye exams.  . Eat a healthy diet. Foods like vegetables, fruits, whole grains, low-fat dairy products, and lean protein foods contain the nutrients you need without too many calories. Decrease your intake of foods high in solid fats, added sugars, and salt. Eat the right amount of calories for you. Get information about a proper diet from your health care provider, if necessary.  . Regular physical exercise is one of the most important things you can do for your health. Most adults should get at least 150 minutes of moderate-intensity exercise (any  activity that increases your heart rate and causes you to sweat) each week. In addition, most adults need muscle-strengthening exercises on 2 or more days a week.  Silver Sneakers may be a benefit available to you. To determine eligibility, you may visit the website: www.silversneakers.com or contact program at 337-342-2619 Mon-Fri between 8AM-8PM.   . Maintain a healthy weight. The body mass index (BMI) is a screening tool to identify possible weight problems. It provides an estimate of body fat based on height and weight. Your health care provider can find your BMI and can help you achieve or maintain a healthy weight.   For adults 20 years and older: ? A BMI below 18.5 is considered underweight. ? A BMI of 18.5 to 24.9 is normal. ? A BMI of 25 to 29.9 is considered overweight. ? A BMI of 30 and above is considered obese.   . Maintain normal blood lipids and cholesterol levels by exercising and minimizing your intake of saturated fat. Eat a balanced diet with plenty of fruit and vegetables. Blood tests for lipids and cholesterol should begin at age 48 and be repeated every 5 years. If your lipid or cholesterol levels are high, you are over 50, or you are at high risk for heart disease, you may need your cholesterol levels checked more frequently. Ongoing high lipid and cholesterol levels should be treated with medicines if diet and exercise are not working.  . If you smoke, find out from your health care  provider how to quit. If you do not use tobacco, please do not start.  . If you choose to drink alcohol, please do not consume more than 2 drinks per day. One drink is considered to be 12 ounces (355 mL) of beer, 5 ounces (148 mL) of wine, or 1.5 ounces (44 mL) of liquor.  . If you are 7-17 years old, ask your health care provider if you should take aspirin to prevent strokes.  . Use sunscreen. Apply sunscreen liberally and repeatedly throughout the day. You should seek shade when your  shadow is shorter than you. Protect yourself by wearing long sleeves, pants, a wide-brimmed hat, and sunglasses year round, whenever you are outdoors.  . Once a month, do a whole body skin exam, using a mirror to look at the skin on your back. Tell your health care provider of new moles, moles that have irregular borders, moles that are larger than a pencil eraser, or moles that have changed in shape or color.

## 2015-05-28 NOTE — Assessment & Plan Note (Signed)
Will check FIT---colon only if positive

## 2015-05-28 NOTE — Progress Notes (Signed)
Pre visit review using our clinic review tool, if applicable. No additional management support is needed unless otherwise documented below in the visit note. 

## 2015-05-28 NOTE — Progress Notes (Signed)
   Subjective:    Patient ID: [name], female    DOB: [birth year], 75 y.o.   MRN: [id number]  HPI  I reviewed health advisor's note, was available for consultation, and agree with documentation and plan.   Review of Systems     Objective:   Physical Exam        Assessment & Plan:

## 2015-05-28 NOTE — Progress Notes (Signed)
Subjective:   [name] is a 75 y.o. female who presents for Medicare Annual (Subsequent) preventive examination.   Cardiac Risk Factors include: advanced age (>73men, >19 women);dyslipidemia;hypertension     Objective:     Vitals: BP 138/70 mmHg  Pulse 55  Temp(Src) 98.4 F (36.9 C) (Oral)  Ht 5\' 3"  (1.6 m)  Wt 126 lb (57.153 kg)  BMI 22.33 kg/m2  SpO2 97%  Body mass index is 22.33 kg/(m^2).   Tobacco History  Smoking status  . Never Smoker   Smokeless tobacco  . Not on file     Counseling given: No   Past Medical History  Diagnosis Date  . Hyperlipidemia   . Hypertension   . Allergy     perennial ; shots from Dr Velora Heckler  . Adenomatous colon polyp 2005  . Murmur     a.  04/2011 Echo:  EF 55-60%, mild-mod ai/mr  . Aortic insufficiency    Past Surgical History  Procedure Laterality Date  . Total abdominal hysterectomy w/ bilateral salpingoophorectomy      abnormal PAP  . Carpal tunnel release      LUE  . Bladder suspension    . Colon polypectomy  2010     X 2;Dr Carlean Purl  . Glaucoma surgery      "for narrow lines" (? narrow angle?)  . Intraocular lens insertion Bilateral 10/22/2014; 11/26/2014   Family History  Problem Relation Age of Onset  . Stroke Mother 66  . Heart attack Father 52  . Lung cancer Father   . Cancer Maternal Aunt      X 2; Gyn & ? primary  . Diabetes Maternal Grandmother   . Heart failure Mother    History  Sexual Activity  . Sexual Activity: No    Outpatient Encounter Prescriptions as of 05/28/2015  Medication Sig  . clemastine (TAVIST) 2.68 MG TABS tablet Take 2.68 mg by mouth 2 (two) times daily.  Marland Kitchen aspirin EC 81 MG tablet Take 1 tablet (81 mg total) by mouth daily.  Marland Kitchen azelastine (ASTELIN) 0.1 % nasal spray Place 1 spray into both nostrils 2 (two) times daily. Use in each nostril as directed  . Calcium Carbonate-Vitamin D (CALCIUM 600+D) 600-400 MG-UNIT per tablet Take 1 tablet by mouth 2 (two) times daily.  .  carvedilol (COREG) 12.5 MG tablet TAKE 1& 1/2 TABLET BY MOUTH TWICE A DAY  . estradiol (ESTRACE) 0.1 MG/GM vaginal cream Place 1 Applicatorful vaginally 2 (two) times a week.  Marland Kitchen glucosamine-chondroitin 500-400 MG tablet 2 am 1 pm  . hydrochlorothiazide (MICROZIDE) 12.5 MG capsule Take 1 capsule (12.5 mg total) by mouth daily.  . Multiple Vitamin (MULTIVITAMIN) capsule Take 1 capsule by mouth daily.  . NON FORMULARY Take 12 mg by mouth daily. Chlorpheniramine Maleate  . Nutritional Supplements (ESTROVEN PO) Take 1 tablet by mouth daily.  . Omega-3 Fatty Acids (FISH OIL PO) Take 1 tablet by mouth daily.  . pravastatin (PRAVACHOL) 40 MG tablet TAKE 1 TABLET BY MOUTH EVERY NIGHT AT BEDTIME  . pyridoxine (B-6) 100 MG tablet Take 100 mg by mouth daily.  . ranitidine (ZANTAC) 75 MG tablet Take 75 mg by mouth as needed.  . triamcinolone (NASACORT) 55 MCG/ACT nasal inhaler Place 2 sprays into the nose daily.  . vitamin C (ASCORBIC ACID) 500 MG tablet Take 500 mg by mouth daily.  . [DISCONTINUED] Difluprednate (DUREZOL OP) Apply to eye. 1 x daily, until 9/24  . [DISCONTINUED] hydrOXYzine (ATARAX/VISTARIL) 10 MG tablet Take  1 tablet (10 mg total) by mouth 3 (three) times daily as needed.  . [DISCONTINUED] pravastatin (PRAVACHOL) 40 MG tablet TAKE 1 TABLET BY MOUTH EVERY NIGHT AT BEDTIME  . [DISCONTINUED] predniSONE (DELTASONE) 10 MG tablet 1 tid pc   No facility-administered encounter medications on file as of 05/28/2015.    Activities of Daily Living In your present state of health, do you have any difficulty performing the following activities: 05/28/2015  Hearing? N  Vision? N  Difficulty concentrating or making decisions? N  Walking or climbing stairs? N  Dressing or bathing? N  Doing errands, shopping? N  Preparing Food and eating ? N  Using the Toilet? N  In the past six months, have you accidently leaked urine? N  Do you have problems with loss of bowel control? N  Managing your  Medications? N  Managing your Finances? N  Housekeeping or managing your Housekeeping? N    Patient Care Team: Venia Carbon, MD as PCP - General (Internal Medicine)    Assessment:     Hearing Screening   125Hz  250Hz  500Hz  1000Hz  2000Hz  4000Hz  8000Hz   Right ear:   40 40 0 40   Left ear:   40 40 0 0   Vision Screening Comments: Last eye exam in November 2016  Exercise Activities and Dietary recommendations Current Exercise Habits: Home exercise routine, Type of exercise: treadmill, Time (Minutes): 30, Frequency (Times/Week): 2, Weekly Exercise (Minutes/Week): 60, Intensity: Mild, Exercise limited by: None identified  Fall Risk Fall Risk  05/28/2015 05/28/2015 05/20/2014 05/12/2013 05/01/2012  Falls in the past year? No No No No No   Depression Screen PHQ 2/9 Scores 05/28/2015 05/28/2015 05/20/2014 05/12/2013  PHQ - 2 Score 0 0 0 0     Cognitive Testing MMSE - Mini Mental State Exam 05/28/2015  Orientation to time 5  Orientation to Place 5  Registration 3  Attention/ Calculation 0  Recall 3  Language- name 2 objects 0  Language- repeat 1  Language- follow 3 step command 3  Language- read & follow direction 0  Write a sentence 0  Copy design 0  Total score 20   PLEASE NOTE: A Mini-Cog screen was completed. Maximum score is 20. A value of 0 denotes this part of Folstein MMSE was not completed.  Orientation to Time - Max 5 Orientation to Place - Max 5 Registration - Max 3 Recall - Max 3 Language Repeat - Max 1 Language Follow 3 Step Command - Max 3  Immunization History  Administered Date(s) Administered  . Influenza Split 01/03/2011, 12/28/2011  . Influenza Whole 12/14/2006, 01/07/2008, 12/16/2008, 12/15/2009  . Influenza, High Dose Seasonal PF 12/25/2012  . Influenza,inj,Quad PF,36+ Mos 11/28/2013, 12/04/2014  . Pneumococcal Polysaccharide-23 05/01/2012  . Td 04/20/2009   Screening Tests Health Maintenance  Topic Date Due  . DEXA SCAN - will discuss with GYN  doctor 04/27/2016 (Originally 05/12/2004)  . ZOSTAVAX - insurance verification 04/27/2016 (Originally 05/13/1999)  . INFLUENZA VACCINE  09/28/2015  . TETANUS/TDAP  04/21/2019  . PNA vac Low Risk Adult - administered Completed      Plan:      I have personally reviewed and addressed the Medicare Annual Wellness questionnaire and have noted the following in the patient's chart:  A. Medical and social history B. Use of alcohol, tobacco or illicit drugs  C. Current medications and supplements D. Functional ability and status E.  Nutritional status F.  Physical activity G. Advance directives H. List of other physicians I.  Hospitalizations, surgeries, and ER visits in previous 12 months J.  [place] to include hearing, vision, cognitive, depression L. Referrals and appointments - none  In addition, I have reviewed and discussed with patient certain preventive protocols, quality metrics, and best practice recommendations. A written personalized care plan for preventive services as well as general preventive health recommendations were provided to patient.  See attached scanned questionnaire for additional information.   Signed,   Lindell Noe, MHA, BS, LPN Health  [id number]

## 2015-05-28 NOTE — Assessment & Plan Note (Signed)
No problems on secondary prevention

## 2015-05-28 NOTE — Progress Notes (Signed)
Subjective:    Patient ID: [name], female    DOB: [birth year], 75 y.o.   MRN: [id number]  HPI Here to establish care--Dr Linna Darner retired I have seen husband--stress because he just had stroke and is in rehab  Known HTN Worse with stress Adequate control on meds  Did have cardiac cath Mild CAD without LV dysfunction No MI On medical Rx No chest pain or SOB Does all instrumental ADLs, treadmill at times, walks dog daily No dizziness or syncope  No trouble on statin No myalgias or GI symptoms  Uses zantac prn only Rarely but occasional tums also No swallowing problems  Current Outpatient Prescriptions on File Prior to Visit  Medication Sig Dispense Refill  . aspirin EC 81 MG tablet Take 1 tablet (81 mg total) by mouth daily. 150 tablet 2  . azelastine (ASTELIN) 0.1 % nasal spray Place 1 spray into both nostrils 2 (two) times daily. Use in each nostril as directed    . Calcium Carbonate-Vitamin D (CALCIUM 600+D) 600-400 MG-UNIT per tablet Take 1 tablet by mouth 2 (two) times daily.    . carvedilol (COREG) 12.5 MG tablet TAKE 1& 1/2 TABLET BY MOUTH TWICE A DAY 270 tablet 2  . estradiol (ESTRACE) 0.1 MG/GM vaginal cream Place 1 Applicatorful vaginally 2 (two) times a week.    Marland Kitchen glucosamine-chondroitin 500-400 MG tablet 2 am 1 pm    . hydrochlorothiazide (MICROZIDE) 12.5 MG capsule Take 1 capsule (12.5 mg total) by mouth daily. 90 capsule 1  . Multiple Vitamin (MULTIVITAMIN) capsule Take 1 capsule by mouth daily.    . NON FORMULARY Take 12 mg by mouth daily. Chlorpheniramine Maleate    . Nutritional Supplements (ESTROVEN PO) Take 1 tablet by mouth daily.    . Omega-3 Fatty Acids (FISH OIL PO) Take 1 tablet by mouth daily.    . pravastatin (PRAVACHOL) 40 MG tablet TAKE 1 TABLET BY MOUTH EVERY NIGHT AT BEDTIME 90 tablet 1  . pyridoxine (B-6) 100 MG tablet Take 100 mg by mouth daily.    . ranitidine (ZANTAC) 75 MG tablet Take 75 mg by mouth as needed.    . triamcinolone  (NASACORT) 55 MCG/ACT nasal inhaler Place 2 sprays into the nose daily. 1 Inhaler 12  . vitamin C (ASCORBIC ACID) 500 MG tablet Take 500 mg by mouth daily.     No current facility-administered medications on file prior to visit.    Allergies  Allergen Reactions  . Amlodipine Besy-Benazepril Hcl Swelling    Lotrel caused swelling of feet & facial swelling unilaterally  . Wasp Venom Swelling    Allergic to wasps    Past Medical History  Diagnosis Date  . Hyperlipidemia   . Hypertension   . Allergy     perennial ; shots from Dr Velora Heckler  . Adenomatous colon polyp 2005  . Murmur     a.  04/2011 Echo:  EF 55-60%, mild-mod ai/mr  . Aortic insufficiency     Past Surgical History  Procedure Laterality Date  . Total abdominal hysterectomy w/ bilateral salpingoophorectomy      abnormal PAP  . Carpal tunnel release      LUE  . Bladder suspension    . Colon polypectomy  2010     X 2;Dr Carlean Purl  . Glaucoma surgery      "for narrow lines" (? narrow angle?)  . Intraocular lens insertion Bilateral 10/22/2014; 11/26/2014    Family History  Problem Relation Age of Onset  . Stroke Mother  46  . Heart attack Father 9  . Lung cancer Father   . Cancer Maternal Aunt      X 2; Gyn & ? primary  . Diabetes Maternal Grandmother   . Heart failure Mother     Social History   Social History  . Marital Status: Married    Spouse Name: N/A  . Number of Children: 2  . Years of Education: N/A   Occupational History  . 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 Smoking status: Never Smoker   . Smokeless tobacco: Not on file  . Alcohol Use: No  . Drug Use: No  . Sexual Activity: No   Other Topics Concern  . Not on file   Social History Narrative   Has living will   Husband is health care POA   Would accept resuscitation but no prolonged ventilation   No tube feeds if cognitively unaware   Review of Systems Notes occasional diarrhea---pepto bismol will  help this usually. Rare imodium also No blood in stool Last colonoscopy 2010---told to hold off now unless problems Keeps up with gyn--discussed recommendations Had right hip bursitis--now better Some urinary urgency-- getting estrogen from Dr Marvel Plan to help this Teeth okay--regular with dentist Wears seat belt    Objective:   Physical Exam  Constitutional: She appears well-developed. No distress.  Neck: Normal range of motion. Neck supple.  Cardiovascular: Normal rate and regular rhythm.  Exam reveals no gallop.   Very slight murmur at base (?early diastole)  Pulmonary/Chest: Effort normal and breath sounds normal. No respiratory distress. She has no wheezes. She has no rales.  Abdominal: Soft.  Musculoskeletal: She exhibits no edema or tenderness.  Lymphadenopathy:    She has no cervical adenopathy.  Skin: No rash noted.  Psychiatric: She has a normal mood and affect.          Assessment & Plan:

## 2015-05-28 NOTE — Assessment & Plan Note (Signed)
Will recheck labs 

## 2015-05-28 NOTE — Assessment & Plan Note (Signed)
Apparently on past cath but no symptoms On statin, ASA BP good

## 2015-05-28 NOTE — Assessment & Plan Note (Signed)
BP Readings from Last 3 Encounters:  05/28/15 138/70  05/28/15 138/70  11/12/14 140/80   Good control

## 2015-06-01 DIAGNOSIS — J3089 Other allergic rhinitis: Secondary | ICD-10-CM | POA: Diagnosis not present

## 2015-06-08 DIAGNOSIS — J3089 Other allergic rhinitis: Secondary | ICD-10-CM | POA: Diagnosis not present

## 2015-06-15 DIAGNOSIS — J3089 Other allergic rhinitis: Secondary | ICD-10-CM | POA: Diagnosis not present

## 2015-06-22 ENCOUNTER — Other Ambulatory Visit (INDEPENDENT_AMBULATORY_CARE_PROVIDER_SITE_OTHER): Payer: Medicare Other

## 2015-06-22 DIAGNOSIS — Z86010 Personal history of colonic polyps: Secondary | ICD-10-CM

## 2015-06-22 LAB — FECAL OCCULT BLOOD, IMMUNOCHEMICAL: FECAL OCCULT BLD: NEGATIVE

## 2015-07-01 DIAGNOSIS — J3089 Other allergic rhinitis: Secondary | ICD-10-CM | POA: Diagnosis not present

## 2015-07-05 DIAGNOSIS — Z1231 Encounter for screening mammogram for malignant neoplasm of breast: Secondary | ICD-10-CM | POA: Diagnosis not present

## 2015-07-06 DIAGNOSIS — J3089 Other allergic rhinitis: Secondary | ICD-10-CM | POA: Diagnosis not present

## 2015-07-20 DIAGNOSIS — J3089 Other allergic rhinitis: Secondary | ICD-10-CM | POA: Diagnosis not present

## 2015-07-29 DIAGNOSIS — J3089 Other allergic rhinitis: Secondary | ICD-10-CM | POA: Diagnosis not present

## 2015-08-05 DIAGNOSIS — J3089 Other allergic rhinitis: Secondary | ICD-10-CM | POA: Diagnosis not present

## 2015-08-12 DIAGNOSIS — J3089 Other allergic rhinitis: Secondary | ICD-10-CM | POA: Diagnosis not present

## 2015-08-26 DIAGNOSIS — J3089 Other allergic rhinitis: Secondary | ICD-10-CM | POA: Diagnosis not present

## 2015-09-02 DIAGNOSIS — J3089 Other allergic rhinitis: Secondary | ICD-10-CM | POA: Diagnosis not present

## 2015-09-09 DIAGNOSIS — J3089 Other allergic rhinitis: Secondary | ICD-10-CM | POA: Diagnosis not present

## 2015-09-21 DIAGNOSIS — J3089 Other allergic rhinitis: Secondary | ICD-10-CM | POA: Diagnosis not present

## 2015-09-30 DIAGNOSIS — J3089 Other allergic rhinitis: Secondary | ICD-10-CM | POA: Diagnosis not present

## 2015-10-14 DIAGNOSIS — J3089 Other allergic rhinitis: Secondary | ICD-10-CM | POA: Diagnosis not present

## 2015-10-19 DIAGNOSIS — J3089 Other allergic rhinitis: Secondary | ICD-10-CM | POA: Diagnosis not present

## 2015-10-26 DIAGNOSIS — J3089 Other allergic rhinitis: Secondary | ICD-10-CM | POA: Diagnosis not present

## 2015-11-09 DIAGNOSIS — J3089 Other allergic rhinitis: Secondary | ICD-10-CM | POA: Diagnosis not present

## 2015-11-16 DIAGNOSIS — J3089 Other allergic rhinitis: Secondary | ICD-10-CM | POA: Diagnosis not present

## 2015-11-30 DIAGNOSIS — J3089 Other allergic rhinitis: Secondary | ICD-10-CM | POA: Diagnosis not present

## 2015-12-08 ENCOUNTER — Ambulatory Visit (INDEPENDENT_AMBULATORY_CARE_PROVIDER_SITE_OTHER): Payer: Medicare Other

## 2015-12-08 ENCOUNTER — Ambulatory Visit: Payer: Medicare Other

## 2015-12-08 DIAGNOSIS — Z23 Encounter for immunization: Secondary | ICD-10-CM | POA: Diagnosis not present

## 2015-12-14 DIAGNOSIS — J3089 Other allergic rhinitis: Secondary | ICD-10-CM | POA: Diagnosis not present

## 2015-12-21 DIAGNOSIS — J3089 Other allergic rhinitis: Secondary | ICD-10-CM | POA: Diagnosis not present

## 2015-12-28 DIAGNOSIS — J3089 Other allergic rhinitis: Secondary | ICD-10-CM | POA: Diagnosis not present

## 2016-01-04 DIAGNOSIS — J3089 Other allergic rhinitis: Secondary | ICD-10-CM | POA: Diagnosis not present

## 2016-01-11 DIAGNOSIS — H52203 Unspecified astigmatism, bilateral: Secondary | ICD-10-CM | POA: Diagnosis not present

## 2016-01-11 DIAGNOSIS — H1851 Endothelial corneal dystrophy: Secondary | ICD-10-CM | POA: Diagnosis not present

## 2016-01-13 DIAGNOSIS — Z1389 Encounter for screening for other disorder: Secondary | ICD-10-CM | POA: Diagnosis not present

## 2016-01-13 DIAGNOSIS — E669 Obesity, unspecified: Secondary | ICD-10-CM | POA: Diagnosis not present

## 2016-01-13 DIAGNOSIS — R3915 Urgency of urination: Secondary | ICD-10-CM | POA: Diagnosis not present

## 2016-01-13 DIAGNOSIS — N952 Postmenopausal atrophic vaginitis: Secondary | ICD-10-CM | POA: Diagnosis not present

## 2016-01-13 DIAGNOSIS — Z01419 Encounter for gynecological examination (general) (routine) without abnormal findings: Secondary | ICD-10-CM | POA: Diagnosis not present

## 2016-01-14 DIAGNOSIS — H903 Sensorineural hearing loss, bilateral: Secondary | ICD-10-CM | POA: Diagnosis not present

## 2016-01-14 DIAGNOSIS — H9313 Tinnitus, bilateral: Secondary | ICD-10-CM | POA: Diagnosis not present

## 2016-01-14 DIAGNOSIS — J301 Allergic rhinitis due to pollen: Secondary | ICD-10-CM | POA: Diagnosis not present

## 2016-01-18 DIAGNOSIS — J3089 Other allergic rhinitis: Secondary | ICD-10-CM | POA: Diagnosis not present

## 2016-01-25 DIAGNOSIS — J3089 Other allergic rhinitis: Secondary | ICD-10-CM | POA: Diagnosis not present

## 2016-02-01 DIAGNOSIS — J3089 Other allergic rhinitis: Secondary | ICD-10-CM | POA: Diagnosis not present

## 2016-02-15 DIAGNOSIS — J3089 Other allergic rhinitis: Secondary | ICD-10-CM | POA: Diagnosis not present

## 2016-02-29 DIAGNOSIS — J3089 Other allergic rhinitis: Secondary | ICD-10-CM | POA: Diagnosis not present

## 2016-03-09 DIAGNOSIS — J3089 Other allergic rhinitis: Secondary | ICD-10-CM | POA: Diagnosis not present

## 2016-03-21 DIAGNOSIS — J3089 Other allergic rhinitis: Secondary | ICD-10-CM | POA: Diagnosis not present

## 2016-03-24 DIAGNOSIS — J3089 Other allergic rhinitis: Secondary | ICD-10-CM | POA: Diagnosis not present

## 2016-04-13 DIAGNOSIS — J3089 Other allergic rhinitis: Secondary | ICD-10-CM | POA: Diagnosis not present

## 2016-04-20 DIAGNOSIS — J3089 Other allergic rhinitis: Secondary | ICD-10-CM | POA: Diagnosis not present

## 2016-04-27 DIAGNOSIS — J3089 Other allergic rhinitis: Secondary | ICD-10-CM | POA: Diagnosis not present

## 2016-05-09 DIAGNOSIS — J3089 Other allergic rhinitis: Secondary | ICD-10-CM | POA: Diagnosis not present

## 2016-05-15 ENCOUNTER — Other Ambulatory Visit: Payer: Self-pay | Admitting: Internal Medicine

## 2016-05-23 DIAGNOSIS — J3089 Other allergic rhinitis: Secondary | ICD-10-CM | POA: Diagnosis not present

## 2016-05-31 ENCOUNTER — Ambulatory Visit (INDEPENDENT_AMBULATORY_CARE_PROVIDER_SITE_OTHER): Payer: Medicare Other | Admitting: Internal Medicine

## 2016-05-31 ENCOUNTER — Encounter: Payer: Self-pay | Admitting: Internal Medicine

## 2016-05-31 ENCOUNTER — Encounter (INDEPENDENT_AMBULATORY_CARE_PROVIDER_SITE_OTHER): Payer: Self-pay

## 2016-05-31 VITALS — BP 128/70 | HR 49 | Temp 97.6°F | Ht 62.5 in | Wt 123.0 lb

## 2016-05-31 DIAGNOSIS — Z0000 Encounter for general adult medical examination without abnormal findings: Secondary | ICD-10-CM | POA: Diagnosis not present

## 2016-05-31 DIAGNOSIS — Z7189 Other specified counseling: Secondary | ICD-10-CM | POA: Diagnosis not present

## 2016-05-31 DIAGNOSIS — I10 Essential (primary) hypertension: Secondary | ICD-10-CM | POA: Diagnosis not present

## 2016-05-31 DIAGNOSIS — E782 Mixed hyperlipidemia: Secondary | ICD-10-CM

## 2016-05-31 DIAGNOSIS — Z1211 Encounter for screening for malignant neoplasm of colon: Secondary | ICD-10-CM

## 2016-05-31 DIAGNOSIS — J301 Allergic rhinitis due to pollen: Secondary | ICD-10-CM | POA: Diagnosis not present

## 2016-05-31 DIAGNOSIS — I25118 Atherosclerotic heart disease of native coronary artery with other forms of angina pectoris: Secondary | ICD-10-CM

## 2016-05-31 DIAGNOSIS — R7309 Other abnormal glucose: Secondary | ICD-10-CM

## 2016-05-31 LAB — CBC WITH DIFFERENTIAL/PLATELET
BASOS ABS: 0 10*3/uL (ref 0.0–0.1)
Basophils Relative: 1 % (ref 0.0–3.0)
EOS ABS: 0.1 10*3/uL (ref 0.0–0.7)
Eosinophils Relative: 3.1 % (ref 0.0–5.0)
HCT: 37.1 % (ref 36.0–46.0)
Hemoglobin: 12.7 g/dL (ref 12.0–15.0)
LYMPHS ABS: 1.9 10*3/uL (ref 0.7–4.0)
Lymphocytes Relative: 43.3 % (ref 12.0–46.0)
MCHC: 34.2 g/dL (ref 30.0–36.0)
MCV: 93.3 fl (ref 78.0–100.0)
Monocytes Absolute: 0.4 10*3/uL (ref 0.1–1.0)
Monocytes Relative: 10.5 % (ref 3.0–12.0)
NEUTROS ABS: 1.8 10*3/uL (ref 1.4–7.7)
NEUTROS PCT: 42.1 % — AB (ref 43.0–77.0)
PLATELETS: 178 10*3/uL (ref 150.0–400.0)
RBC: 3.98 Mil/uL (ref 3.87–5.11)
RDW: 14.7 % (ref 11.5–15.5)
WBC: 4.3 10*3/uL (ref 4.0–10.5)

## 2016-05-31 LAB — LIPID PANEL
CHOLESTEROL: 160 mg/dL (ref 0–200)
HDL: 50.6 mg/dL (ref >39.00)
LDL CALC: 91 mg/dL (ref 0–99)
NonHDL: 108.96
TRIGLYCERIDES: 91 mg/dL (ref 0.0–149.0)
Total CHOL/HDL Ratio: 3
VLDL: 18.2 mg/dL (ref 0.0–40.0)

## 2016-05-31 LAB — COMPREHENSIVE METABOLIC PANEL
ALT: 19 U/L (ref 0–35)
AST: 22 U/L (ref 0–37)
Albumin: 4.4 g/dL (ref 3.5–5.2)
Alkaline Phosphatase: 30 U/L — ABNORMAL LOW (ref 39–117)
BILIRUBIN TOTAL: 0.7 mg/dL (ref 0.2–1.2)
BUN: 18 mg/dL (ref 6–23)
CO2: 32 mEq/L (ref 19–32)
CREATININE: 0.66 mg/dL (ref 0.40–1.20)
Calcium: 9.4 mg/dL (ref 8.4–10.5)
Chloride: 101 mEq/L (ref 96–112)
GFR: 92.29 mL/min (ref >60.00)
GLUCOSE: 113 mg/dL — AB (ref 70–99)
Potassium: 3.9 mEq/L (ref 3.5–5.1)
Sodium: 138 mEq/L (ref 135–145)
Total Protein: 7 g/dL (ref 6.0–8.3)

## 2016-05-31 LAB — HEMOGLOBIN A1C: HEMOGLOBIN A1C: 5.8 % (ref 4.6–6.5)

## 2016-05-31 LAB — T4, FREE: Free T4: 0.99 ng/dL (ref 0.60–1.60)

## 2016-05-31 MED ORDER — CARVEDILOL 12.5 MG PO TABS: ORAL_TABLET | ORAL | 3 refills | Status: DC

## 2016-05-31 MED ORDER — PRAVASTATIN SODIUM 40 MG PO TABS: 40.0000 mg | ORAL_TABLET | Freq: Every day | ORAL | 3 refills | Status: DC

## 2016-05-31 MED ORDER — HYDROCHLOROTHIAZIDE 12.5 MG PO CAPS
12.5000 mg | ORAL_CAPSULE | Freq: Every day | ORAL | 3 refills | Status: DC
Start: 2016-05-31 — End: 2017-06-21

## 2016-05-31 NOTE — Assessment & Plan Note (Signed)
Has stable DOE Continue asa and statin

## 2016-05-31 NOTE — Assessment & Plan Note (Signed)
BP Readings from Last 3 Encounters:  05/31/16 128/70  05/28/15 138/70  05/28/15 138/70   Good control

## 2016-05-31 NOTE — Progress Notes (Signed)
Pre visit review using our clinic review tool, if applicable. No additional management support is needed unless otherwise documented below in the visit note. 

## 2016-05-31 NOTE — Assessment & Plan Note (Signed)
I have personally reviewed the Medicare Annual Wellness questionnaire and have noted 1. The patient's medical and social history 2. Their use of alcohol, tobacco or illicit drugs 3. Their current medications and supplements 4. The patient's functional ability including ADL's, fall risks, home safety risks and hearing or visual             impairment. 5. Diet and physical activities 6. Evidence for depression or mood disorders  The patients weight, height, BMI and visual acuity have been recorded in the chart I have made referrals, counseling and provided education to the patient based review of the above and I have provided the pt with a written personalized care plan for preventive services.  I have provided you with a copy of your personalized plan for preventive services. Please take the time to review along with your updated medication list.  Gets mammogram through gyn Will check FIT Discussed fitness Yearly flu vaccine

## 2016-05-31 NOTE — Progress Notes (Signed)
Subjective:    Patient ID: [name], female    DOB: May 24, 1940, 76 y.o.   MRN: [id number]  HPI Here for Medicare wellness and follow up of chronic health conditions Reviewed advanced directives Reviewed other doctors-- Dr Gwen Pounds (gyn), [facility] allergy Remus Blake), Dr Carolyn Stare (eye doctor), dentist --Dr Derenda Mis Joined gym in November-- goes with husband Vision is okay--past cataract surgery No surgery or hospitalization or ER in past year Rare drink on special occasion. No tobacco No falls No depression or anhedonia Independent with instrumental ADLs No apparent memory problems  Has ongoing tinnitus since last fall Occasional shooting pains down sides of face--sharp sound also  Static sound in right ear Hearing is better in left--no major issues No vertigo  No problems with heart No chest pain Stable DOE--- has to take it slow. Will rest and resolves (especially with vacuuming) No palpitations No dizziness or syncope No edema  No problems with statin No myalgias or GI problems  Allergies with pollen Satisfied with her regimen  Current Outpatient Prescriptions on File Prior to Visit  Medication Sig Dispense Refill  . aspirin EC 81 MG tablet Take 1 tablet (81 mg total) by mouth daily. 150 tablet 2  . azelastine (ASTELIN) 0.1 % nasal spray Place 1 spray into both nostrils 2 (two) times daily. Use in each nostril as directed    . Calcium Carbonate-Vitamin D (CALCIUM 600+D) 600-400 MG-UNIT per tablet Take 1 tablet by mouth 2 (two) times daily.    . carvedilol (COREG) 12.5 MG tablet TAKE 1& 1/2 TABLET BY MOUTH TWICE A DAY 270 tablet 3  . estradiol (ESTRACE) 0.1 MG/GM vaginal cream Place 1 Applicatorful vaginally 2 (two) times a week.    Marland Kitchen glucosamine-chondroitin 500-400 MG tablet 2 am 1 pm    . hydrochlorothiazide (MICROZIDE) 12.5 MG capsule TAKE 1 CAPSULE (12.5 MG TOTAL) BY MOUTH DAILY. 90 capsule 3  . Multiple Vitamin (MULTIVITAMIN) capsule Take 1 capsule by  mouth daily.    . Nutritional Supplements (ESTROVEN PO) Take 1 tablet by mouth daily.    . Omega-3 Fatty Acids (FISH OIL PO) Take 1 tablet by mouth daily.    . pravastatin (PRAVACHOL) 40 MG tablet TAKE 1 TABLET (40 MG TOTAL) BY MOUTH AT BEDTIME. 90 tablet 3  . pyridoxine (B-6) 100 MG tablet Take 100 mg by mouth daily.    . ranitidine (ZANTAC) 75 MG tablet Take 75 mg by mouth as needed.    . triamcinolone (NASACORT) 55 MCG/ACT nasal inhaler Place 2 sprays into the nose daily. 1 Inhaler 12  . vitamin C (ASCORBIC ACID) 500 MG tablet Take 500 mg by mouth daily.     No current facility-administered medications on file prior to visit.     Allergies  Allergen Reactions  . Amlodipine Besy-Benazepril Hcl Swelling    Lotrel caused swelling of feet & facial swelling unilaterally  . Wasp Venom Swelling    Allergic to wasps    Past Medical History:  Diagnosis Date  . Adenomatous colon polyp 2005  . Allergy    perennial ; shots from Dr Velora Heckler  . Aortic insufficiency   . Hyperlipidemia   . Hypertension   . Murmur    a.  04/2011 Echo:  EF 55-60%, mild-mod ai/mr    Past Surgical History:  Procedure Laterality Date  . BLADDER SUSPENSION    . CARPAL TUNNEL RELEASE     LUE  . colon polypectomy  2010    X 2;Dr Carlean Purl  . GLAUCOMA  SURGERY     "for narrow lines" (? narrow angle?)  . INTRAOCULAR LENS INSERTION Bilateral 10/22/2014; 11/26/2014  . TOTAL ABDOMINAL HYSTERECTOMY W/ BILATERAL SALPINGOOPHORECTOMY     abnormal PAP    Family History  Problem Relation Age of Onset  . Stroke Mother 33  . Heart failure Mother   . Heart attack Father 60  . Lung cancer Father   . Cancer Maternal Aunt      X 2; Gyn & ? primary  . Diabetes Maternal Grandmother     Social History   Social History  . Marital status: Married    Spouse name: N/A  . Number of children: 2  . Years of education: N/A   Occupational History  . 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  Smoking status: Never Smoker  . Smokeless tobacco: Never Used  . Alcohol use No  . Drug use: No  . Sexual activity: No   Other Topics Concern  . Not on file   Social History Narrative   Has living will   Husband is health care POA   Would accept resuscitation but no prolonged ventilation   No tube feeds if cognitively unaware   Review of Systems Only uses estrogen cream occasionally.  Has urge incontinence-- does okay with frequent voids and the estrogen. Sleeps fairly well--occasional bad night Appetite is good Weight stable Teeth are good--keeps up with dentist Wears seat belt Bowels are okay--no blood No significant back or joint pain (other than with vacuuming) Rare heartburn if overeats. Ranitidine helps. No dysphagia Dry skin --no worrisome lesions (sees Dr Nehemiah Massed but not recently)    Objective:   Physical Exam  Constitutional: She is oriented to person, place, and time. She appears well-nourished. No distress.  HENT:  Mouth/Throat: Oropharynx is clear and moist. No oropharyngeal exudate.  Neck: No thyromegaly present.  Cardiovascular: Normal rate, regular rhythm and intact distal pulses.  Exam reveals no gallop.   Soft aortic systolic murmur  Pulmonary/Chest: Effort normal and breath sounds normal. No respiratory distress. She has no wheezes. She has no rales.  Abdominal: Soft. There is no tenderness.  Musculoskeletal: She exhibits no edema or tenderness.  Lymphadenopathy:    She has no cervical adenopathy.  Neurological: She is alert and oriented to person, place, and time.  President-- "Donetta Potts--- Bush" [id number] Recall 3/3  Skin: No rash noted. No erythema.  Psychiatric: She has a normal mood and affect. Her behavior is normal.          Assessment & Plan:

## 2016-05-31 NOTE — Assessment & Plan Note (Signed)
Continues under allergist care

## 2016-05-31 NOTE — Assessment & Plan Note (Signed)
No problems with statin 

## 2016-05-31 NOTE — Assessment & Plan Note (Signed)
Will check labs again 

## 2016-05-31 NOTE — Assessment & Plan Note (Signed)
See social history 

## 2016-06-01 DIAGNOSIS — J3089 Other allergic rhinitis: Secondary | ICD-10-CM | POA: Diagnosis not present

## 2016-06-09 DIAGNOSIS — H1045 Other chronic allergic conjunctivitis: Secondary | ICD-10-CM | POA: Diagnosis not present

## 2016-06-09 DIAGNOSIS — J3089 Other allergic rhinitis: Secondary | ICD-10-CM | POA: Diagnosis not present

## 2016-06-09 DIAGNOSIS — J3081 Allergic rhinitis due to animal (cat) (dog) hair and dander: Secondary | ICD-10-CM | POA: Diagnosis not present

## 2016-06-20 DIAGNOSIS — J3089 Other allergic rhinitis: Secondary | ICD-10-CM | POA: Diagnosis not present

## 2016-06-29 DIAGNOSIS — J3089 Other allergic rhinitis: Secondary | ICD-10-CM | POA: Diagnosis not present

## 2016-07-06 DIAGNOSIS — J3089 Other allergic rhinitis: Secondary | ICD-10-CM | POA: Diagnosis not present

## 2016-07-10 ENCOUNTER — Other Ambulatory Visit (INDEPENDENT_AMBULATORY_CARE_PROVIDER_SITE_OTHER): Payer: Medicare Other

## 2016-07-10 DIAGNOSIS — Z1211 Encounter for screening for malignant neoplasm of colon: Secondary | ICD-10-CM

## 2016-07-10 LAB — FECAL OCCULT BLOOD, IMMUNOCHEMICAL: Fecal Occult Bld: POSITIVE — AB

## 2016-07-12 ENCOUNTER — Other Ambulatory Visit: Payer: Self-pay | Admitting: Internal Medicine

## 2016-07-12 ENCOUNTER — Encounter: Payer: Self-pay | Admitting: Internal Medicine

## 2016-07-12 DIAGNOSIS — R195 Other fecal abnormalities: Secondary | ICD-10-CM

## 2016-07-12 DIAGNOSIS — Z1231 Encounter for screening mammogram for malignant neoplasm of breast: Secondary | ICD-10-CM | POA: Diagnosis not present

## 2016-07-20 DIAGNOSIS — J3089 Other allergic rhinitis: Secondary | ICD-10-CM | POA: Diagnosis not present

## 2016-08-01 DIAGNOSIS — J3089 Other allergic rhinitis: Secondary | ICD-10-CM | POA: Diagnosis not present

## 2016-08-08 DIAGNOSIS — J3089 Other allergic rhinitis: Secondary | ICD-10-CM | POA: Diagnosis not present

## 2016-08-22 DIAGNOSIS — J3089 Other allergic rhinitis: Secondary | ICD-10-CM | POA: Diagnosis not present

## 2016-08-25 ENCOUNTER — Encounter: Payer: Self-pay | Admitting: Internal Medicine

## 2016-08-25 ENCOUNTER — Ambulatory Visit (INDEPENDENT_AMBULATORY_CARE_PROVIDER_SITE_OTHER): Payer: Medicare Other | Admitting: Internal Medicine

## 2016-08-25 ENCOUNTER — Encounter (INDEPENDENT_AMBULATORY_CARE_PROVIDER_SITE_OTHER): Payer: Self-pay

## 2016-08-25 VITALS — BP 164/70 | HR 60 | Ht 62.25 in | Wt 126.1 lb

## 2016-08-25 DIAGNOSIS — R195 Other fecal abnormalities: Secondary | ICD-10-CM | POA: Diagnosis not present

## 2016-08-25 DIAGNOSIS — R197 Diarrhea, unspecified: Secondary | ICD-10-CM | POA: Diagnosis not present

## 2016-08-25 DIAGNOSIS — I25118 Atherosclerotic heart disease of native coronary artery with other forms of angina pectoris: Secondary | ICD-10-CM

## 2016-08-25 DIAGNOSIS — K648 Other hemorrhoids: Secondary | ICD-10-CM

## 2016-08-25 NOTE — Patient Instructions (Signed)
You have been scheduled for a colonoscopy. Please follow written instructions given to you at your visit today.  Please pick up your prep supplies at the pharmacy. If you use inhalers (even only as needed), please bring them with you on the day of your procedure.   I appreciate the opportunity to care for you. Carl Gessner, MD, FACG 

## 2016-08-25 NOTE — Progress Notes (Signed)
[name] 76 y.o. 1940/03/07 [id number]  Assessment & Plan:   Encounter Diagnoses  Name Primary?  . Heme + stool Yes  . Diarrhea, intermittent   . Hemorrhoids, complicated     Evaluate with colonoscopy because of the heme positive stool and intermittent diarrhea. Pending that consider banding of hemorrhoids.The risks and benefits as well as alternatives of endoscopic procedure(s) have been discussed and reviewed. All questions answered. The patient agrees to proceed.   I appreciate the opportunity to care for this patient. CC: Venia Carbon, MD   Subjective:   Chief Complaint: iFOBT +  HPIThe patient is a very nice elderly white woman known to me from prior colonoscopies in 2005 and 2010. She had an 8 mm adenoma in 2005. Originally intended a repeat colonoscopy in 2015 but guidelines changes extended that the 2020. In the past 2 years or so she started having some episodic intermittent diarrhea. Originally a Hemoccult test was negative, but now a night FOBT test is positive. She never sees bleeding. Her hemorrhoids do bulging prolapse at times. She has urgent diarrhea intermittent and random, can go for weeks without. When it does occur there can be some urge incontinence. Has had 2 child deliveries, the first was breech and difficult. Has urinary frequency also. Denies significant urinary incontinence. Does feel something's well on the outside of the anal area at times also. No anal pain no rectal pain. GI review of systems is otherwise negative.  Husband recently had back surgery and has to be close to home right now. No trips planned.  Allergies  Allergen Reactions  . Amlodipine Besy-Benazepril Hcl Swelling    Lotrel caused swelling of feet & facial swelling unilaterally  . Lotrel [Amlodipine Besy-Benazepril Hcl]   . Wasp Venom Swelling    Allergic to wasps   Current Meds  Medication Sig  . aspirin EC 81 MG tablet Take 1 tablet (81 mg total) by mouth daily.    Marland Kitchen azelastine (ASTELIN) 0.1 % nasal spray Place 1 spray into both nostrils 2 (two) times daily. Use in each nostril as directed  . Bismuth Subsalicylate (PEPTO-BISMOL) 262 MG TABS Take by mouth as needed.  . Calcium Carbonate Antacid (TUMS PO) Take by mouth as needed.  . Calcium Carbonate-Vitamin D (CALCIUM 600+D) 600-400 MG-UNIT per tablet Take 1 tablet by mouth 2 (two) times daily.  . carvedilol (COREG) 12.5 MG tablet TAKE 1& 1/2 TABLET BY MOUTH TWICE A DAY  . estradiol (ESTRACE) 0.1 MG/GM vaginal cream Place 1 Applicatorful vaginally 2 (two) times a week.  . fexofenadine (ALLEGRA) 180 MG tablet Take 180 mg by mouth daily.  Marland Kitchen glucosamine-chondroitin 500-400 MG tablet 2 am 1 pm  . hydrochlorothiazide (MICROZIDE) 12.5 MG capsule Take 1 capsule (12.5 mg total) by mouth daily.  . Multiple Vitamin (MULTIVITAMIN) capsule Take 1 capsule by mouth daily.  . Omega-3 Fatty Acids (FISH OIL PO) Take 1 tablet by mouth daily.  . pravastatin (PRAVACHOL) 40 MG tablet Take 1 tablet (40 mg total) by mouth at bedtime.  . pyridoxine (B-6) 100 MG tablet Take 100 mg by mouth daily.  . ranitidine (ZANTAC) 75 MG tablet Take 75 mg by mouth as needed.  . triamcinolone (NASACORT) 55 MCG/ACT nasal inhaler Place 2 sprays into the nose daily.  . vitamin C (ASCORBIC ACID) 500 MG tablet Take 500 mg by mouth daily.   Past Medical History:  Diagnosis Date  . Adenomatous colon polyp 2005  . Allergy    perennial ; shots  from Dr Velora Heckler  . Aortic insufficiency   . Hyperlipidemia   . Hypertension   . Murmur    a.  04/2011 Echo:  EF 55-60%, mild-mod ai/mr  . Uterine cancer [facility])    Past Surgical History:  Procedure Laterality Date  . BLADDER SUSPENSION    . CARPAL TUNNEL RELEASE Left   . COLONOSCOPY  multiple  . GLAUCOMA SURGERY     "for narrow lines" (? narrow angle?)  . INTRAOCULAR LENS INSERTION Bilateral 10/22/2014; 11/26/2014  . TOTAL ABDOMINAL HYSTERECTOMY W/ BILATERAL SALPINGOOPHORECTOMY     abnormal PAP    Social History   Social History  . Marital status: Married    Spouse name: N/A  . Number of children: 2  . Years of education: N/A   Occupational History  . 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 Smoking status: Never Smoker  . Smokeless tobacco: Never Used  . Alcohol use Yes     Comment: special events  . Drug use: No  . Sexual activity: No   Other Topics Concern  . Not on file   Social History Narrative   Has living will   Husband is health care POA   Would accept resuscitation but no prolonged ventilation   No tube feeds if cognitively unaware      2 daughters, 4 grandsons 1 great-grandson   Retired Medical sales representative from [employer]   No caffeine   08/25/2016      family history includes Cancer in her maternal aunt; Diabetes in her maternal grandmother; Heart attack (age of onset: 15) in her father; Heart failure in her mother; Lung cancer in her father; Stroke (age of onset: 31) in her mother.   Review of Systems As per history of present illness. Also seasonal allergies. All other review of systems are negative.  Objective:   Physical Exam @BP  (!) 164/70 (BP Location: Left Arm, Patient Position: Sitting, Cuff Size: Normal)   Pulse 60   Ht 5' 2.25" (1.581 m) Comment: height measured without shoes  Wt 126 lb 2 oz (57.2 kg)   BMI 22.88 kg/m @  General:  Well-developed, well-nourished and in no acute distress Eyes:  anicteric. ENT:   Mouth and posterior pharynx free of lesions. dentures Neck:   supple w/o thyromegaly or mass.  Lungs: Clear to auscultation bilaterally. Heart:  S1S2, no rubs, murmurs, gallops. Abdomen:  soft, non-tender, no hepatosplenomegaly, hernia, or mass and BS+.  Rectal:  Patti [place], [place] present.  Small fleshy tags Slightly decreased resting and voluntary squeeze/tone No mass, rectocele - soft brown stool Appropriate abd CTR woith slightly delayed relaxation and descent with simulated defecation Lymph:  no  cervical or supraclavicular adenopathy. Extremities:   no edema, cyanosis or clubbing Skin   no rash. Neuro:  A&O x 3.  Psych:  appropriate mood and  Affect.   Data Reviewed:  See above. I reviewed primary care notes labs in the EMR and previous colonoscopy and pathology reports last hemoglobin 12.7 with normal MCV 2 months ago

## 2016-08-31 DIAGNOSIS — J3089 Other allergic rhinitis: Secondary | ICD-10-CM | POA: Diagnosis not present

## 2016-09-12 DIAGNOSIS — J3089 Other allergic rhinitis: Secondary | ICD-10-CM | POA: Diagnosis not present

## 2016-10-03 DIAGNOSIS — J3089 Other allergic rhinitis: Secondary | ICD-10-CM | POA: Diagnosis not present

## 2016-10-13 ENCOUNTER — Encounter: Payer: Self-pay | Admitting: Internal Medicine

## 2016-10-13 ENCOUNTER — Ambulatory Visit (AMBULATORY_SURGERY_CENTER): Payer: Medicare Other | Admitting: Internal Medicine

## 2016-10-13 VITALS — BP 169/73 | HR 58 | Temp 98.6°F | Resp 16 | Ht 62.25 in | Wt 126.0 lb

## 2016-10-13 DIAGNOSIS — R195 Other fecal abnormalities: Secondary | ICD-10-CM | POA: Diagnosis not present

## 2016-10-13 DIAGNOSIS — Z1211 Encounter for screening for malignant neoplasm of colon: Secondary | ICD-10-CM | POA: Diagnosis not present

## 2016-10-13 MED ORDER — SODIUM CHLORIDE 0.9 % IV SOLN: 500.0000 mL | INTRAVENOUS | Status: DC

## 2016-10-13 NOTE — Progress Notes (Signed)
To PACU VSS. Report to [email or web address] 

## 2016-10-13 NOTE — Op Note (Signed)
[facility] Patient Name: Anikah Hogge Procedure Date: 10/13/2016 3:22 PM MRN: [id number] Endoscopist: Gatha Mayer , MD Age: 76 Referring MD:  Date of Birth: [birth year] Gender: Female Account #: [id number] Procedure:                Colonoscopy Indications:              Heme positive stool Medicines:                Propofol per Anesthesia, Monitored Anesthesia Care Procedure:                Pre-Anesthesia Assessment:                           - Prior to the procedure, a History and Physical                            was performed, and patient medications and                            allergies were reviewed. The patient's tolerance of                            previous anesthesia was also reviewed. The risks                            and benefits of the procedure and the sedation                            options and risks were discussed with the patient.                            All questions were answered, and informed consent                            was obtained. Prior Anticoagulants: The patient has                            taken no previous anticoagulant or antiplatelet                            agents. ASA Grade Assessment: II - A patient with                            mild systemic disease. After reviewing the risks                            and benefits, the patient was deemed in                            satisfactory condition to undergo the procedure.                           After obtaining informed consent, the colonoscope  was passed under direct vision. Throughout the                            procedure, the patient's blood pressure, pulse, and                            oxygen saturations were monitored continuously. The                            Colonoscope was introduced through the anus and                            advanced to the the cecum, identified by                            appendiceal orifice and  ileocecal valve. The                            colonoscopy was performed without difficulty. The                            patient tolerated the procedure well. The quality                            of the bowel preparation was good. The ileocecal                            valve, appendiceal orifice, and rectum were                            photographed. The bowel preparation used was                            Miralax. Scope In: 3:27:10 PM Scope Out: 3:37:54 PM Scope Withdrawal Time: 0 hours 5 minutes 58 seconds  Total Procedure Duration: 0 hours 10 minutes 44 seconds  Findings:                 The perianal and digital rectal examinations were                            normal.                           External and internal hemorrhoids were found during                            retroflexion.                           The exam was otherwise without abnormality on                            direct and retroflexion views. Complications:            No immediate complications. Estimated Blood Loss:     Estimated  blood loss: none. Impression:               - External and internal hemorrhoids.                           - The examination was otherwise normal on direct                            and retroflexion views.                           - No specimens collected. Recommendation:           - Patient has a contact number available for                            emergencies. The signs and symptoms of potential                            delayed complications were discussed with the                            patient. Return to normal activities tomorrow.                            Written discharge instructions were provided to the                            patient.                           - Resume previous diet.                           - Continue present medications.                           - No repeat colonoscopy due to age. Gatha Mayer, MD 10/13/2016 3:45:47 PM This  report has been signed electronically.

## 2016-10-13 NOTE — Patient Instructions (Addendum)
Hemorrhoids must have caused the microscopic blood in the stool.   No further evaluation.  If you feel the need you may schedule a follow-up and we could considier banding of hemorrhoids which can reduce leakage of the stool that may occur.  I appreciate the opportunity to care for you. Gatha Mayer, MD, [facility]   Hemorrhoid handout given to patient.  YOU HAD AN ENDOSCOPIC PROCEDURE TODAY AT [facility]:   Refer to the procedure report that was given to you for any specific questions about what was found during the examination.  If the procedure report does not answer your questions, please call your gastroenterologist to clarify.  If you requested that your care partner not be given the details of your procedure findings, then the procedure report has been included in a sealed envelope for you to review at your convenience later.  YOU SHOULD EXPECT: Some feelings of bloating in the abdomen. Passage of more gas than usual.  Walking can help get rid of the air that was put into your GI tract during the procedure and reduce the bloating. If you had a lower endoscopy (such as a colonoscopy or flexible sigmoidoscopy) you may notice spotting of blood in your stool or on the toilet paper. If you underwent a bowel prep for your procedure, you may not have a normal bowel movement for a few days.  Please Note:  You might notice some irritation and congestion in your nose or some drainage.  This is from the oxygen used during your procedure.  There is no need for concern and it should clear up in a day or so.  SYMPTOMS TO REPORT IMMEDIATELY:   Following lower endoscopy (colonoscopy or flexible sigmoidoscopy):  Excessive amounts of blood in the stool  Significant tenderness or worsening of abdominal pains  Swelling of the abdomen that is new, acute  Fever of 100F or higher For urgent or emergent issues, a gastroenterologist can be reached at any hour by calling [phone]  [phone].   DIET:  We do recommend a small meal at first, but then you may proceed to your regular diet.  Drink plenty of fluids but you should avoid alcoholic beverages for 24 hours.  ACTIVITY:  You should plan to take it easy for the rest of today and you should NOT DRIVE or use heavy machinery until tomorrow (because of the sedation medicines used during the test).    FOLLOW UP: Our staff will call the number listed on your records the next business day following your procedure to check on you and address any questions or concerns that you may have regarding the information given to you following your procedure. If we do not reach you, we will leave a message.  However, if you are feeling well and you are not experiencing any problems, there is no need to return our call.  We will assume that you have returned to your regular daily activities without incident.  If any biopsies were taken you will be contacted by phone or by letter within the next 1-3 weeks.  Please call us at [phone] if you have not heard about the biopsies in 3 weeks.    SIGNATURES/CONFIDENTIALITY: You and/or your care partner have signed paperwork which will be entered into your electronic medical record.  These signatures attest to the fact that that the information above on your After Visit Summary has been reviewed and is understood.  Full responsibility of the confidentiality of this  discharge information lies with you and/or your care-partner. 

## 2016-10-16 ENCOUNTER — Telehealth: Payer: Self-pay | Admitting: *Deleted

## 2016-10-16 NOTE — Telephone Encounter (Addendum)
  Follow up Call-  Call back number 10/13/2016  Post procedure Call Back phone  # [phone]  Permission to leave phone message Yes  Some recent data might be hidden     Patient questions:  Do you have a fever, pain , or abdominal swelling? No Pain Score  0 *  Have you tolerated food without any problems? Yes.    Have you been able to return to your normal activities? Yes.    Do you have any questions about your discharge instructions: Diet   No. Medications  No. Follow up visit  No.  Do you have questions or concerns about your Care? No.  Actions: * If pain score is 4 or above: No action needed, pain <4. Pt "still having diarrhea", patient states this is not new. Encouraged to call MD as needed.

## 2016-10-19 DIAGNOSIS — J3089 Other allergic rhinitis: Secondary | ICD-10-CM | POA: Diagnosis not present

## 2016-10-31 DIAGNOSIS — J3089 Other allergic rhinitis: Secondary | ICD-10-CM | POA: Diagnosis not present

## 2016-11-07 DIAGNOSIS — J3089 Other allergic rhinitis: Secondary | ICD-10-CM | POA: Diagnosis not present

## 2016-11-21 DIAGNOSIS — J3089 Other allergic rhinitis: Secondary | ICD-10-CM | POA: Diagnosis not present

## 2016-11-27 ENCOUNTER — Ambulatory Visit (INDEPENDENT_AMBULATORY_CARE_PROVIDER_SITE_OTHER): Payer: Medicare Other

## 2016-11-27 DIAGNOSIS — Z23 Encounter for immunization: Secondary | ICD-10-CM | POA: Diagnosis not present

## 2016-12-07 DIAGNOSIS — J3089 Other allergic rhinitis: Secondary | ICD-10-CM | POA: Diagnosis not present

## 2016-12-19 DIAGNOSIS — J3089 Other allergic rhinitis: Secondary | ICD-10-CM | POA: Diagnosis not present

## 2016-12-28 DIAGNOSIS — J3089 Other allergic rhinitis: Secondary | ICD-10-CM | POA: Diagnosis not present

## 2017-01-09 DIAGNOSIS — J3089 Other allergic rhinitis: Secondary | ICD-10-CM | POA: Diagnosis not present

## 2017-01-22 DIAGNOSIS — Z961 Presence of intraocular lens: Secondary | ICD-10-CM | POA: Diagnosis not present

## 2017-01-22 DIAGNOSIS — H35371 Puckering of macula, right eye: Secondary | ICD-10-CM | POA: Diagnosis not present

## 2017-01-22 DIAGNOSIS — H52203 Unspecified astigmatism, bilateral: Secondary | ICD-10-CM | POA: Diagnosis not present

## 2017-01-25 DIAGNOSIS — J3089 Other allergic rhinitis: Secondary | ICD-10-CM | POA: Diagnosis not present

## 2017-02-08 DIAGNOSIS — J3089 Other allergic rhinitis: Secondary | ICD-10-CM | POA: Diagnosis not present

## 2017-02-22 DIAGNOSIS — J3089 Other allergic rhinitis: Secondary | ICD-10-CM | POA: Diagnosis not present

## 2017-03-08 DIAGNOSIS — Z1389 Encounter for screening for other disorder: Secondary | ICD-10-CM | POA: Diagnosis not present

## 2017-03-08 DIAGNOSIS — Z01419 Encounter for gynecological examination (general) (routine) without abnormal findings: Secondary | ICD-10-CM | POA: Diagnosis not present

## 2017-03-08 DIAGNOSIS — N952 Postmenopausal atrophic vaginitis: Secondary | ICD-10-CM | POA: Diagnosis not present

## 2017-03-08 DIAGNOSIS — Z130 Encounter for screening for diseases of the blood and blood-forming organs and certain disorders involving the immune mechanism: Secondary | ICD-10-CM | POA: Diagnosis not present

## 2017-03-08 DIAGNOSIS — N393 Stress incontinence (female) (male): Secondary | ICD-10-CM | POA: Diagnosis not present

## 2017-03-12 DIAGNOSIS — J3089 Other allergic rhinitis: Secondary | ICD-10-CM | POA: Diagnosis not present

## 2017-03-15 DIAGNOSIS — J3089 Other allergic rhinitis: Secondary | ICD-10-CM | POA: Diagnosis not present

## 2017-03-15 DIAGNOSIS — J301 Allergic rhinitis due to pollen: Secondary | ICD-10-CM | POA: Diagnosis not present

## 2017-03-22 DIAGNOSIS — J3089 Other allergic rhinitis: Secondary | ICD-10-CM | POA: Diagnosis not present

## 2017-04-05 DIAGNOSIS — J3089 Other allergic rhinitis: Secondary | ICD-10-CM | POA: Diagnosis not present

## 2017-04-12 DIAGNOSIS — J3089 Other allergic rhinitis: Secondary | ICD-10-CM | POA: Diagnosis not present

## 2017-04-19 ENCOUNTER — Other Ambulatory Visit: Payer: Self-pay | Admitting: Internal Medicine

## 2017-04-19 DIAGNOSIS — I10 Essential (primary) hypertension: Secondary | ICD-10-CM

## 2017-04-26 DIAGNOSIS — J3089 Other allergic rhinitis: Secondary | ICD-10-CM | POA: Diagnosis not present

## 2017-05-10 DIAGNOSIS — J3089 Other allergic rhinitis: Secondary | ICD-10-CM | POA: Diagnosis not present

## 2017-05-15 DIAGNOSIS — J3089 Other allergic rhinitis: Secondary | ICD-10-CM | POA: Diagnosis not present

## 2017-05-22 DIAGNOSIS — J3089 Other allergic rhinitis: Secondary | ICD-10-CM | POA: Diagnosis not present

## 2017-06-01 ENCOUNTER — Encounter: Payer: Medicare Other | Admitting: Internal Medicine

## 2017-06-04 ENCOUNTER — Encounter: Payer: Medicare Other | Admitting: Internal Medicine

## 2017-06-05 DIAGNOSIS — J3089 Other allergic rhinitis: Secondary | ICD-10-CM | POA: Diagnosis not present

## 2017-06-21 ENCOUNTER — Ambulatory Visit (INDEPENDENT_AMBULATORY_CARE_PROVIDER_SITE_OTHER): Payer: Medicare Other | Admitting: Internal Medicine

## 2017-06-21 ENCOUNTER — Encounter: Payer: Self-pay | Admitting: Internal Medicine

## 2017-06-21 ENCOUNTER — Encounter

## 2017-06-21 VITALS — BP 140/72 | HR 57 | Temp 97.9°F | Ht 62.5 in | Wt 121.0 lb

## 2017-06-21 DIAGNOSIS — Z0000 Encounter for general adult medical examination without abnormal findings: Secondary | ICD-10-CM | POA: Diagnosis not present

## 2017-06-21 DIAGNOSIS — E782 Mixed hyperlipidemia: Secondary | ICD-10-CM

## 2017-06-21 DIAGNOSIS — Z7189 Other specified counseling: Secondary | ICD-10-CM | POA: Diagnosis not present

## 2017-06-21 DIAGNOSIS — R7303 Prediabetes: Secondary | ICD-10-CM | POA: Diagnosis not present

## 2017-06-21 DIAGNOSIS — I10 Essential (primary) hypertension: Secondary | ICD-10-CM | POA: Diagnosis not present

## 2017-06-21 DIAGNOSIS — F39 Unspecified mood [affective] disorder: Secondary | ICD-10-CM

## 2017-06-21 MED ORDER — CARVEDILOL 12.5 MG PO TABS
ORAL_TABLET | ORAL | 3 refills | Status: DC
Start: 2017-06-21 — End: 2018-07-30

## 2017-06-21 MED ORDER — HYDROCHLOROTHIAZIDE 12.5 MG PO CAPS: 12.5000 mg | ORAL_CAPSULE | Freq: Every day | ORAL | 3 refills | Status: DC

## 2017-06-21 MED ORDER — PRAVASTATIN SODIUM 40 MG PO TABS: 40.0000 mg | ORAL_TABLET | Freq: Every day | ORAL | 3 refills | Status: DC

## 2017-06-21 MED ORDER — SERTRALINE HCL 25 MG PO TABS: 25.0000 mg | ORAL_TABLET | Freq: Every day | ORAL | 3 refills | Status: DC

## 2017-06-21 MED ORDER — AZELASTINE HCL 0.1 % NA SOLN: 1.0000 | Freq: Two times a day (BID) | NASAL | 5 refills | Status: DC

## 2017-06-21 NOTE — Assessment & Plan Note (Signed)
See social history 

## 2017-06-21 NOTE — Progress Notes (Signed)
Subjective:    Patient ID: [name], female    DOB: 27-May-1940, 77 y.o.   MRN: [id number]  HPI Here for Medicare wellness visit and follow up of chronic health conditions With daughter [name] Reviewed form and advanced directives Reviewed other doctors No alcohol or tobacco Does try to go to the gym regularly Vision and hearing are fine No falls Having mood issues now--see below Independent with instrumental ADLs No sig memory problems  Lots of stress with husband's cancer Family notes depression, anxiety, trouble with understanding at doctor visits, etc Trouble accepting his diagnosis and the road ahead  No chest pain No SOB No dizziness or sycnope No edema No palpitations  On statin No problems with this---no myalgia or GI problems  Occasional indigestion Uses zantac prn No dysphagia  Current Outpatient Medications on File Prior to Visit  Medication Sig Dispense Refill  . aspirin EC 81 MG tablet Take 1 tablet (81 mg total) by mouth daily. 150 tablet 2  . azelastine (ASTELIN) 0.1 % nasal spray Place 1 spray into both nostrils 2 (two) times daily. Use in each nostril as directed    . Bismuth Subsalicylate (PEPTO-BISMOL) 262 MG TABS Take by mouth as needed.    . Calcium Carbonate Antacid (TUMS PO) Take by mouth as needed.    . Calcium Carbonate-Vitamin D (CALCIUM 600+D) 600-400 MG-UNIT per tablet Take 1 tablet by mouth 2 (two) times daily.    . carvedilol (COREG) 12.5 MG tablet TAKE 1& 1/2 TABLET BY MOUTH TWICE A DAY 270 tablet 0  . estradiol (ESTRACE) 0.1 MG/GM vaginal cream Place 1 Applicatorful vaginally 2 (two) times a week.    Marland Kitchen glucosamine-chondroitin 500-400 MG tablet 2 am 1 pm    . guaiFENesin (MUCINEX) 600 MG 12 hr tablet Take 600 mg by mouth 2 (two) times daily as needed.    . hydrochlorothiazide (MICROZIDE) 12.5 MG capsule Take 1 capsule (12.5 mg total) by mouth daily. 90 capsule 3  . loratadine (CLARITIN) 10 MG tablet Take 10 mg by mouth daily.    .  Multiple Vitamin (MULTIVITAMIN) capsule Take 1 capsule by mouth daily.    . Omega-3 Fatty Acids (FISH OIL PO) Take 1 tablet by mouth daily.    . pravastatin (PRAVACHOL) 40 MG tablet Take 1 tablet (40 mg total) by mouth at bedtime. 90 tablet 3  . pyridoxine (B-6) 100 MG tablet Take 100 mg by mouth daily.    . ranitidine (ZANTAC) 75 MG tablet Take 75 mg by mouth as needed.    . triamcinolone (NASACORT) 55 MCG/ACT nasal inhaler Place 2 sprays into the nose daily. 1 Inhaler 12  . vitamin C (ASCORBIC ACID) 500 MG tablet Take 500 mg by mouth daily.     No current facility-administered medications on file prior to visit.     Allergies  Allergen Reactions  . Amlodipine Besy-Benazepril Hcl Swelling    Lotrel caused swelling of feet & facial swelling unilaterally  . Lotrel [Amlodipine Besy-Benazepril Hcl]   . Wasp Venom Swelling    Allergic to wasps    Past Medical History:  Diagnosis Date  . Adenomatous colon polyp 2005  . Allergy    perennial ; shots from Dr Velora Heckler  . Aortic insufficiency   . Hyperlipidemia   . Hypertension   . Murmur    a.  04/2011 Echo:  EF 55-60%, mild-mod ai/mr  . Uterine cancer [facility])     Past Surgical History:  Procedure Laterality Date  . BLADDER SUSPENSION    .  CARPAL TUNNEL RELEASE Left   . COLONOSCOPY  multiple  . GLAUCOMA SURGERY     "for narrow lines" (? narrow angle?)  . INTRAOCULAR LENS INSERTION Bilateral 10/22/2014; 11/26/2014  . TOTAL ABDOMINAL HYSTERECTOMY W/ BILATERAL SALPINGOOPHORECTOMY     abnormal PAP    Family History  Problem Relation Age of Onset  . Stroke Mother 68  . Heart failure Mother   . Heart attack Father 24  . Lung cancer Father   . Cancer Maternal Aunt         X 2; Gyn & ? primary  . Diabetes Maternal Grandmother   . Esophageal cancer Neg Hx   . Colon cancer Neg Hx   . Stomach cancer Neg Hx   . Rectal cancer Neg Hx     Social History   Socioeconomic History  . Marital status: Married    Spouse name: Not on file   . Number of children: 2  . Years of education: Not on file  . Highest education level: Not on file  Occupational History  . 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 Financial resource strain: Not on file  . Food insecurity:    Worry: Not on file    Inability: Not on file  . Transportation needs:    Medical: Not on file    Non-medical: Not on file  Tobacco Use  . Smoking status: Never Smoker  . Smokeless tobacco: Never Used  Substance and Sexual Activity  . Alcohol use: Yes    Comment: special events  . Drug use: No  . Sexual activity: Never  Lifestyle  . Physical activity:    Days per week: Not on file    Minutes per session: Not on file  . Stress: Not on file  Relationships  . Social connections:    Talks on phone: Not on file    Gets together: Not on file    Attends religious service: Not on file    Active member of club or organization: Not on file    Attends meetings of clubs or organizations: Not on file    Relationship status: Not on file  . Intimate partner violence:    Fear of current or ex partner: Not on file    Emotionally abused: Not on file    Physically abused: Not on file    Forced sexual activity: Not on file  Other Topics Concern  . Not on file  Social History Narrative   Has living will   Husband is health care POA-- children   Would accept resuscitation but no prolonged ventilation   No tube feeds if cognitively unaware      2 daughters, 4 grandsons 1 great-grandson   Retired Medical sales representative from [employer] Has been more active in husband's care/care for dog, etc Appetite seems okay Not sleeping well Wears seat belt Teeth okay---keeps up with dentist Keeps up with gyn---last mammogram last May through gyn No skin rash or lesions Regular back pain/shoulders--- uses tylenol and occasional aleve    Objective:   Physical Exam  Constitutional: She is oriented to person, place, and time. She  appears well-developed and well-nourished.  HENT:  Mouth/Throat: Oropharynx is clear and moist. No oropharyngeal exudate.  Neck: No thyromegaly present.  Cardiovascular: Normal rate, regular rhythm and intact distal pulses. Exam reveals no gallop.  Soft systolic murmur at apex  Pulmonary/Chest: Effort normal and breath sounds normal. No respiratory  distress. She has no wheezes. She has no rales.  Abdominal: Soft. There is no tenderness.  Musculoskeletal: She exhibits no edema or tenderness.  Lymphadenopathy:    She has no cervical adenopathy.  Neurological: She is alert and oriented to person, place, and time.  President--- "Dwaine Deter, Bush" [phone] [id number] Recall 3/3  Skin: No rash noted. No erythema.  Psychiatric:  Tearful and upset Normal interaction, speech, appearance          Assessment & Plan:

## 2017-06-21 NOTE — Patient Instructions (Signed)
Please start the sertraline and let me know if you have any problems.

## 2017-06-21 NOTE — Assessment & Plan Note (Signed)
Elevated for years Will just check labs

## 2017-06-21 NOTE — Assessment & Plan Note (Signed)
Wants to continue primary prevention

## 2017-06-21 NOTE — Assessment & Plan Note (Signed)
I have personally reviewed the Medicare Annual Wellness questionnaire and have noted 1. The patient's medical and social history 2. Their use of alcohol, tobacco or illicit drugs 3. Their current medications and supplements 4. The patient's functional ability including ADL's, fall risks, home safety risks and hearing or visual             impairment. 5. Diet and physical activities 6. Evidence for depression or mood disorders  The patients weight, height, BMI and visual acuity have been recorded in the chart I have made referrals, counseling and provided education to the patient based review of the above and I have provided the pt with a written personalized care plan for preventive services.  I have provided you with a copy of your personalized plan for preventive services. Please take the time to review along with your updated medication list.  No more colons--normal last year Will continue mammograms till 79 Pneumovax booster soon No pap due to age Trying to deal with husband's cancer

## 2017-06-21 NOTE — Assessment & Plan Note (Signed)
Reactive anxiety and depression Bad enough to affect her eating, concentration,etc Will try low dose sertraline after discussion

## 2017-06-21 NOTE — Progress Notes (Signed)
Hearing Screening   Method: Audiometry   125Hz  250Hz  500Hz  1000Hz  2000Hz  3000Hz  4000Hz  6000Hz  8000Hz   Right ear:   25 20 20  20     Left ear:   20 20 20  20     Vision Screening Comments: November 2018

## 2017-06-21 NOTE — Assessment & Plan Note (Signed)
BP Readings from Last 3 Encounters:  06/21/17 140/72  10/13/16 (!) 169/73  08/25/16 (!) 164/70   Good control

## 2017-06-22 LAB — LIPID PANEL
CHOLESTEROL: 148 mg/dL (ref 0–200)
HDL: 55.4 mg/dL (ref >39.00)
LDL CALC: 77 mg/dL (ref 0–99)
NonHDL: 92.41
TRIGLYCERIDES: 77 mg/dL (ref 0.0–149.0)
Total CHOL/HDL Ratio: 3
VLDL: 15.4 mg/dL (ref 0.0–40.0)

## 2017-06-22 LAB — CBC
HEMATOCRIT: 35.6 % — AB (ref 36.0–46.0)
HEMOGLOBIN: 12.2 g/dL (ref 12.0–15.0)
MCHC: 34.2 g/dL (ref 30.0–36.0)
MCV: 91.9 fl (ref 78.0–100.0)
PLATELETS: 216 10*3/uL (ref 150.0–400.0)
RBC: 3.87 Mil/uL (ref 3.87–5.11)
RDW: 14.1 % (ref 11.5–15.5)
WBC: 6.6 10*3/uL (ref 4.0–10.5)

## 2017-06-22 LAB — COMPREHENSIVE METABOLIC PANEL
ALT: 14 U/L (ref 0–35)
AST: 19 U/L (ref 0–37)
Albumin: 4.3 g/dL (ref 3.5–5.2)
Alkaline Phosphatase: 44 U/L (ref 39–117)
BILIRUBIN TOTAL: 0.6 mg/dL (ref 0.2–1.2)
BUN: 25 mg/dL — ABNORMAL HIGH (ref 6–23)
CO2: 30 meq/L (ref 19–32)
Calcium: 9.9 mg/dL (ref 8.4–10.5)
Chloride: 100 mEq/L (ref 96–112)
Creatinine, Ser: 0.66 mg/dL (ref 0.40–1.20)
GFR: 92.03 mL/min (ref >60.00)
GLUCOSE: 98 mg/dL (ref 70–99)
POTASSIUM: 4.2 meq/L (ref 3.5–5.1)
SODIUM: 139 meq/L (ref 135–145)
TOTAL PROTEIN: 7.3 g/dL (ref 6.0–8.3)

## 2017-06-22 LAB — HEMOGLOBIN A1C: Hgb A1c MFr Bld: 5.8 % (ref 4.6–6.5)

## 2017-06-22 LAB — T4, FREE: Free T4: 0.91 ng/dL (ref 0.60–1.60)

## 2017-07-03 DIAGNOSIS — J3089 Other allergic rhinitis: Secondary | ICD-10-CM | POA: Diagnosis not present

## 2017-07-03 DIAGNOSIS — J301 Allergic rhinitis due to pollen: Secondary | ICD-10-CM | POA: Diagnosis not present

## 2017-07-17 DIAGNOSIS — J3089 Other allergic rhinitis: Secondary | ICD-10-CM | POA: Diagnosis not present

## 2017-07-20 ENCOUNTER — Encounter: Payer: Medicare Other | Admitting: Internal Medicine

## 2017-07-24 ENCOUNTER — Ambulatory Visit: Payer: Medicare Other | Admitting: Internal Medicine

## 2017-07-24 ENCOUNTER — Encounter: Payer: Self-pay | Admitting: Internal Medicine

## 2017-07-24 ENCOUNTER — Ambulatory Visit (INDEPENDENT_AMBULATORY_CARE_PROVIDER_SITE_OTHER): Payer: Medicare Other | Admitting: Internal Medicine

## 2017-07-24 VITALS — BP 142/70 | HR 51 | Temp 97.9°F | Ht 63.0 in | Wt 124.0 lb

## 2017-07-24 DIAGNOSIS — F39 Unspecified mood [affective] disorder: Secondary | ICD-10-CM | POA: Diagnosis not present

## 2017-07-24 MED ORDER — SERTRALINE HCL 25 MG PO TABS: 25.0000 mg | ORAL_TABLET | Freq: Every day | ORAL | 3 refills | Status: DC

## 2017-07-24 NOTE — Progress Notes (Signed)
Subjective:    Patient ID: [name], female    DOB: 25-Aug-1940, 77 y.o.   MRN: [id number]  HPI Here for follow up of her mood issues  Has continued to take the sertraline Has had a lot of stress with husband being in hospital, etc She feels she is doing better now  Current Outpatient Medications on File Prior to Visit  Medication Sig Dispense Refill  . aspirin EC 81 MG tablet Take 1 tablet (81 mg total) by mouth daily. 150 tablet 2  . azelastine (ASTELIN) 0.1 % nasal spray Place 1 spray into both nostrils 2 (two) times daily. Use in each nostril as directed 30 mL 5  . Bismuth Subsalicylate (PEPTO-BISMOL) 262 MG TABS Take by mouth as needed.    . Calcium Carbonate Antacid (TUMS PO) Take by mouth as needed.    . Calcium Carbonate-Vitamin D (CALCIUM 600+D) 600-400 MG-UNIT per tablet Take 1 tablet by mouth 2 (two) times daily.    . carvedilol (COREG) 12.5 MG tablet TAKE 1& 1/2 TABLET BY MOUTH TWICE A DAY 270 tablet 3  . estradiol (ESTRACE) 0.1 MG/GM vaginal cream Place 1 Applicatorful vaginally 2 (two) times a week.    Marland Kitchen glucosamine-chondroitin 500-400 MG tablet 2 am 1 pm    . hydrochlorothiazide (MICROZIDE) 12.5 MG capsule Take 1 capsule (12.5 mg total) by mouth daily. 90 capsule 3  . loratadine (CLARITIN) 10 MG tablet Take 10 mg by mouth daily.    . Multiple Vitamin (MULTIVITAMIN) capsule Take 1 capsule by mouth daily.    . Omega-3 Fatty Acids (FISH OIL PO) Take 1 tablet by mouth daily.    . pravastatin (PRAVACHOL) 40 MG tablet Take 1 tablet (40 mg total) by mouth at bedtime. 90 tablet 3  . pyridoxine (B-6) 100 MG tablet Take 100 mg by mouth daily.    . ranitidine (ZANTAC) 75 MG tablet Take 75 mg by mouth as needed.    . sertraline (ZOLOFT) 25 MG tablet Take 1 tablet (25 mg total) by mouth daily. 30 tablet 3  . triamcinolone (NASACORT) 55 MCG/ACT nasal inhaler Place 2 sprays into the nose daily. 1 Inhaler 12  . vitamin C (ASCORBIC ACID) 500 MG tablet Take 500 mg by mouth daily.     Marland Kitchen guaiFENesin (MUCINEX) 600 MG 12 hr tablet Take 600 mg by mouth 2 (two) times daily as needed.     No current facility-administered medications on file prior to visit.     Allergies  Allergen Reactions  . Amlodipine Besy-Benazepril Hcl Swelling    Lotrel caused swelling of feet & facial swelling unilaterally  . Lotrel [Amlodipine Besy-Benazepril Hcl]   . Wasp Venom Swelling    Allergic to wasps    Past Medical History:  Diagnosis Date  . Adenomatous colon polyp 2005  . Allergy    perennial ; shots from Dr Velora Heckler  . Aortic insufficiency   . Hyperlipidemia   . Hypertension   . Murmur    a.  04/2011 Echo:  EF 55-60%, mild-mod ai/mr  . Uterine cancer [facility])     Past Surgical History:  Procedure Laterality Date  . BLADDER SUSPENSION    . CARPAL TUNNEL RELEASE Left   . COLONOSCOPY  multiple  . GLAUCOMA SURGERY     "for narrow lines" (? narrow angle?)  . INTRAOCULAR LENS INSERTION Bilateral 10/22/2014; 11/26/2014  . TOTAL ABDOMINAL HYSTERECTOMY W/ BILATERAL SALPINGOOPHORECTOMY     abnormal PAP    Family History  Problem Relation Age of  Onset  . Stroke Mother 57  . Heart failure Mother   . Heart attack Father 16  . Lung cancer Father   . Cancer Maternal Aunt         X 2; Gyn & ? primary  . Diabetes Maternal Grandmother   . Esophageal cancer Neg Hx   . Colon cancer Neg Hx   . Stomach cancer Neg Hx   . Rectal cancer Neg Hx     Social History   Socioeconomic History  . Marital status: Married    Spouse name: Not on file  . Number of children: 2  . Years of education: Not on file  . Highest education level: Not on file  Occupational History  . 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 Financial resource strain: Not on file  . Food insecurity:    Worry: Not on file    Inability: Not on file  . Transportation needs:    Medical: Not on file    Non-medical: Not on file  Tobacco Use  . Smoking status: Never Smoker  .  Smokeless tobacco: Never Used  Substance and Sexual Activity  . Alcohol use: Yes    Comment: special events  . Drug use: No  . Sexual activity: Never  Lifestyle  . Physical activity:    Days per week: Not on file    Minutes per session: Not on file  . Stress: Not on file  Relationships  . Social connections:    Talks on phone: Not on file    Gets together: Not on file    Attends religious service: Not on file    Active member of club or organization: Not on file    Attends meetings of clubs or organizations: Not on file    Relationship status: Not on file  . Intimate partner violence:    Fear of current or ex partner: Not on file    Emotionally abused: Not on file    Physically abused: Not on file    Forced sexual activity: Not on file  Other Topics Concern  . Not on file  Social History Narrative   Has living will   Husband is health care POA-- children   Would accept resuscitation but no prolonged ventilation   No tube feeds if cognitively unaware      2 daughters, 4 grandsons 1 great-grandson   Retired Medical sales representative from [employer] Sleeps okay--sometimes gets things on her mind Appetite is okay Weight up slightly    Objective:   Physical Exam  Constitutional: She appears well-developed. No distress.  Psychiatric: She has a normal mood and affect. Her behavior is normal.           Assessment & Plan:

## 2017-07-24 NOTE — Assessment & Plan Note (Signed)
Has had a response to the sertraline Will continue at this low dose Discussed the need to increase the dose if her mood worsens again

## 2017-07-26 DIAGNOSIS — J3089 Other allergic rhinitis: Secondary | ICD-10-CM | POA: Diagnosis not present

## 2017-08-14 DIAGNOSIS — J301 Allergic rhinitis due to pollen: Secondary | ICD-10-CM | POA: Diagnosis not present

## 2017-08-14 DIAGNOSIS — J3089 Other allergic rhinitis: Secondary | ICD-10-CM | POA: Diagnosis not present

## 2017-08-21 DIAGNOSIS — J3089 Other allergic rhinitis: Secondary | ICD-10-CM | POA: Diagnosis not present

## 2017-09-04 DIAGNOSIS — J3089 Other allergic rhinitis: Secondary | ICD-10-CM | POA: Diagnosis not present

## 2017-09-20 DIAGNOSIS — J3089 Other allergic rhinitis: Secondary | ICD-10-CM | POA: Diagnosis not present

## 2017-10-04 DIAGNOSIS — J3089 Other allergic rhinitis: Secondary | ICD-10-CM | POA: Diagnosis not present

## 2017-10-18 DIAGNOSIS — J3089 Other allergic rhinitis: Secondary | ICD-10-CM | POA: Diagnosis not present

## 2017-10-26 DIAGNOSIS — J3081 Allergic rhinitis due to animal (cat) (dog) hair and dander: Secondary | ICD-10-CM | POA: Diagnosis not present

## 2017-10-26 DIAGNOSIS — H1045 Other chronic allergic conjunctivitis: Secondary | ICD-10-CM | POA: Diagnosis not present

## 2017-10-26 DIAGNOSIS — J3089 Other allergic rhinitis: Secondary | ICD-10-CM | POA: Diagnosis not present

## 2017-10-30 ENCOUNTER — Ambulatory Visit (INDEPENDENT_AMBULATORY_CARE_PROVIDER_SITE_OTHER): Payer: Medicare Other | Admitting: Internal Medicine

## 2017-10-30 ENCOUNTER — Encounter: Payer: Self-pay | Admitting: Internal Medicine

## 2017-10-30 VITALS — BP 132/78 | HR 51 | Temp 97.9°F | Ht 63.0 in | Wt 126.0 lb

## 2017-10-30 DIAGNOSIS — F39 Unspecified mood [affective] disorder: Secondary | ICD-10-CM | POA: Diagnosis not present

## 2017-10-30 NOTE — Assessment & Plan Note (Signed)
Doing okay for now---but knows hard times are ahead Discussed increasing the dose--but will hold off for now She will call if she worsens

## 2017-10-30 NOTE — Progress Notes (Signed)
Subjective:    Patient ID: [name], female    DOB: 01-24-1941, 77 y.o.   MRN: [id number]  HPI Here for follow up of her mood issues  Husband is not doing great Recent restaging shows new cancer Starting new immunotherapy  Overall, he is better than after his RT/chemo This is helping--at least for now  Mood is okay---but she knows there will be hard times ahead  Current Outpatient Medications on File Prior to Visit  Medication Sig Dispense Refill  . aspirin EC 81 MG tablet Take 1 tablet (81 mg total) by mouth daily. 150 tablet 2  . azelastine (ASTELIN) 0.1 % nasal spray Place 1 spray into both nostrils 2 (two) times daily. Use in each nostril as directed 30 mL 5  . Bismuth Subsalicylate (PEPTO-BISMOL) 262 MG TABS Take by mouth as needed.    . Calcium Carbonate Antacid (TUMS PO) Take by mouth as needed.    . Calcium Carbonate-Vitamin D (CALCIUM 600+D) 600-400 MG-UNIT per tablet Take 1 tablet by mouth 2 (two) times daily.    . carvedilol (COREG) 12.5 MG tablet TAKE 1& 1/2 TABLET BY MOUTH TWICE A DAY 270 tablet 3  . estradiol (ESTRACE) 0.1 MG/GM vaginal cream Place 1 Applicatorful vaginally 2 (two) times a week.    Marland Kitchen glucosamine-chondroitin 500-400 MG tablet 2 am 1 pm    . guaiFENesin (MUCINEX) 600 MG 12 hr tablet Take 600 mg by mouth 2 (two) times daily as needed.    . hydrochlorothiazide (MICROZIDE) 12.5 MG capsule Take 1 capsule (12.5 mg total) by mouth daily. 90 capsule 3  . loratadine (CLARITIN) 10 MG tablet Take 10 mg by mouth daily.    . Multiple Vitamin (MULTIVITAMIN) capsule Take 1 capsule by mouth daily.    . Omega-3 Fatty Acids (FISH OIL PO) Take 1 tablet by mouth daily.    . pravastatin (PRAVACHOL) 40 MG tablet Take 1 tablet (40 mg total) by mouth at bedtime. 90 tablet 3  . pyridoxine (B-6) 100 MG tablet Take 100 mg by mouth daily.    . ranitidine (ZANTAC) 75 MG tablet Take 75 mg by mouth as needed.    . sertraline (ZOLOFT) 25 MG tablet Take 1 tablet (25 mg total)  by mouth daily. 90 tablet 3  . triamcinolone (NASACORT) 55 MCG/ACT nasal inhaler Place 2 sprays into the nose daily. 1 Inhaler 12  . vitamin C (ASCORBIC ACID) 500 MG tablet Take 500 mg by mouth daily.     No current facility-administered medications on file prior to visit.     Allergies  Allergen Reactions  . Amlodipine Besy-Benazepril Hcl Swelling    Lotrel caused swelling of feet & facial swelling unilaterally  . Lotrel [Amlodipine Besy-Benazepril Hcl]   . Wasp Venom Swelling    Allergic to wasps    Past Medical History:  Diagnosis Date  . Adenomatous colon polyp 2005  . Allergy    perennial ; shots from Dr Velora Heckler  . Aortic insufficiency   . Hyperlipidemia   . Hypertension   . Murmur    a.  04/2011 Echo:  EF 55-60%, mild-mod ai/mr  . Uterine cancer [facility])     Past Surgical History:  Procedure Laterality Date  . BLADDER SUSPENSION    . CARPAL TUNNEL RELEASE Left   . COLONOSCOPY  multiple  . GLAUCOMA SURGERY     "for narrow lines" (? narrow angle?)  . INTRAOCULAR LENS INSERTION Bilateral 10/22/2014; 11/26/2014  . TOTAL ABDOMINAL HYSTERECTOMY W/ BILATERAL SALPINGOOPHORECTOMY  abnormal PAP    Family History  Problem Relation Age of Onset  . Stroke Mother 53  . Heart failure Mother   . Heart attack Father 62  . Lung cancer Father   . Cancer Maternal Aunt         X 2; Gyn & ? primary  . Diabetes Maternal Grandmother   . Esophageal cancer Neg Hx   . Colon cancer Neg Hx   . Stomach cancer Neg Hx   . Rectal cancer Neg Hx     Social History   Socioeconomic History  . Marital status: Married    Spouse name: Not on file  . Number of children: 2  . Years of education: Not on file  . Highest education level: Not on file  Occupational History  . 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 Financial resource strain: Not on file  . Food insecurity:    Worry: Not on file    Inability: Not on file  . Transportation needs:     Medical: Not on file    Non-medical: Not on file  Tobacco Use  . Smoking status: Never Smoker  . Smokeless tobacco: Never Used  Substance and Sexual Activity  . Alcohol use: Yes    Comment: special events  . Drug use: No  . Sexual activity: Never  Lifestyle  . Physical activity:    Days per week: Not on file    Minutes per session: Not on file  . Stress: Not on file  Relationships  . Social connections:    Talks on phone: Not on file    Gets together: Not on file    Attends religious service: Not on file    Active member of club or organization: Not on file    Attends meetings of clubs or organizations: Not on file    Relationship status: Not on file  . Intimate partner violence:    Fear of current or ex partner: Not on file    Emotionally abused: Not on file    Physically abused: Not on file    Forced sexual activity: Not on file  Other Topics Concern  . 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 No distress.  Psychiatric: She has a normal mood and affect. Her behavior is normal.           Assessment & Plan:

## 2017-10-31 ENCOUNTER — Encounter: Payer: Self-pay | Admitting: Internal Medicine

## 2017-10-31 DIAGNOSIS — Z1231 Encounter for screening mammogram for malignant neoplasm of breast: Secondary | ICD-10-CM | POA: Diagnosis not present

## 2017-10-31 DIAGNOSIS — Z780 Asymptomatic menopausal state: Secondary | ICD-10-CM | POA: Diagnosis not present

## 2017-11-08 DIAGNOSIS — J3089 Other allergic rhinitis: Secondary | ICD-10-CM | POA: Diagnosis not present

## 2017-11-29 DIAGNOSIS — J3089 Other allergic rhinitis: Secondary | ICD-10-CM | POA: Diagnosis not present

## 2017-11-30 ENCOUNTER — Ambulatory Visit (INDEPENDENT_AMBULATORY_CARE_PROVIDER_SITE_OTHER): Payer: Medicare Other

## 2017-11-30 DIAGNOSIS — Z23 Encounter for immunization: Secondary | ICD-10-CM | POA: Diagnosis not present

## 2017-12-13 DIAGNOSIS — J3089 Other allergic rhinitis: Secondary | ICD-10-CM | POA: Diagnosis not present

## 2018-01-03 DIAGNOSIS — J3089 Other allergic rhinitis: Secondary | ICD-10-CM | POA: Diagnosis not present

## 2018-01-29 DIAGNOSIS — H52203 Unspecified astigmatism, bilateral: Secondary | ICD-10-CM | POA: Diagnosis not present

## 2018-01-29 DIAGNOSIS — H1851 Endothelial corneal dystrophy: Secondary | ICD-10-CM | POA: Diagnosis not present

## 2018-01-29 DIAGNOSIS — H26492 Other secondary cataract, left eye: Secondary | ICD-10-CM | POA: Diagnosis not present

## 2018-01-29 DIAGNOSIS — H35371 Puckering of macula, right eye: Secondary | ICD-10-CM | POA: Diagnosis not present

## 2018-02-05 DIAGNOSIS — J3089 Other allergic rhinitis: Secondary | ICD-10-CM | POA: Diagnosis not present

## 2018-02-22 ENCOUNTER — Ambulatory Visit (INDEPENDENT_AMBULATORY_CARE_PROVIDER_SITE_OTHER): Payer: Medicare Other | Admitting: Internal Medicine

## 2018-02-22 ENCOUNTER — Encounter: Payer: Self-pay | Admitting: Internal Medicine

## 2018-02-22 VITALS — BP 138/84 | HR 57 | Temp 98.2°F | Wt 122.0 lb

## 2018-02-22 DIAGNOSIS — J0100 Acute maxillary sinusitis, unspecified: Secondary | ICD-10-CM

## 2018-02-22 NOTE — Progress Notes (Signed)
Subjective:    Patient ID: [name], female    DOB: 1940/05/06, 77 y.o.   MRN: [id number]  HPI Here due to cough Daughter concerned ---made her come in  Husband just died last week Got very run down with the funeral, etc  Started with symptoms 2 days ago Scratchy, raw throat and some ear pain Ears better today Gargled with warm salt water--helped throat some Some cough---- dry  No fever Felt cold yesterday but no chills or sweats No SOB  Taking benedryl---forgetting claritin (discussed to stick with claritin)  Current Outpatient Medications on File Prior to Visit  Medication Sig Dispense Refill  . aspirin EC 81 MG tablet Take 1 tablet (81 mg total) by mouth daily. 150 tablet 2  . azelastine (ASTELIN) 0.1 % nasal spray Place 1 spray into both nostrils 2 (two) times daily. Use in each nostril as directed 30 mL 5  . Bismuth Subsalicylate (PEPTO-BISMOL) 262 MG TABS Take by mouth as needed.    . Calcium Carbonate Antacid (TUMS PO) Take by mouth as needed.    . Calcium Carbonate-Vitamin D (CALCIUM 600+D) 600-400 MG-UNIT per tablet Take 1 tablet by mouth 2 (two) times daily.    . carvedilol (COREG) 12.5 MG tablet TAKE 1& 1/2 TABLET BY MOUTH TWICE A DAY 270 tablet 3  . estradiol (ESTRACE) 0.1 MG/GM vaginal cream Place 1 Applicatorful vaginally 2 (two) times a week.    Marland Kitchen glucosamine-chondroitin 500-400 MG tablet 2 am 1 pm    . guaiFENesin (MUCINEX) 600 MG 12 hr tablet Take 600 mg by mouth 2 (two) times daily as needed.    . hydrochlorothiazide (MICROZIDE) 12.5 MG capsule Take 1 capsule (12.5 mg total) by mouth daily. 90 capsule 3  . loratadine (CLARITIN) 10 MG tablet Take 10 mg by mouth daily.    . Multiple Vitamin (MULTIVITAMIN) capsule Take 1 capsule by mouth daily.    . Omega-3 Fatty Acids (FISH OIL PO) Take 1 tablet by mouth daily.    . pravastatin (PRAVACHOL) 40 MG tablet Take 1 tablet (40 mg total) by mouth at bedtime. 90 tablet 3  . pyridoxine (B-6) 100 MG tablet Take  100 mg by mouth daily.    . ranitidine (ZANTAC) 75 MG tablet Take 75 mg by mouth as needed.    . sertraline (ZOLOFT) 25 MG tablet Take 1 tablet (25 mg total) by mouth daily. 90 tablet 3  . triamcinolone (NASACORT) 55 MCG/ACT nasal inhaler Place 2 sprays into the nose daily. 1 Inhaler 12  . vitamin C (ASCORBIC ACID) 500 MG tablet Take 500 mg by mouth daily.     No current facility-administered medications on file prior to visit.     Allergies  Allergen Reactions  . Amlodipine Besy-Benazepril Hcl Swelling    Lotrel caused swelling of feet & facial swelling unilaterally  . Lotrel [Amlodipine Besy-Benazepril Hcl]   . Wasp Venom Swelling    Allergic to wasps    Past Medical History:  Diagnosis Date  . Adenomatous colon polyp 2005  . Allergy    perennial ; shots from Dr Velora Heckler  . Aortic insufficiency   . Hyperlipidemia   . Hypertension   . Murmur    a.  04/2011 Echo:  EF 55-60%, mild-mod ai/mr  . Uterine cancer [facility])     Past Surgical History:  Procedure Laterality Date  . BLADDER SUSPENSION    . CARPAL TUNNEL RELEASE Left   . COLONOSCOPY  multiple  . GLAUCOMA SURGERY     "  for narrow lines" (? narrow angle?)  . INTRAOCULAR LENS INSERTION Bilateral 10/22/2014; 11/26/2014  . TOTAL ABDOMINAL HYSTERECTOMY W/ BILATERAL SALPINGOOPHORECTOMY     abnormal PAP    Family History  Problem Relation Age of Onset  . Stroke Mother 23  . Heart failure Mother   . Heart attack Father 52  . Lung cancer Father   . Cancer Maternal Aunt         X 2; Gyn & ? primary  . Diabetes Maternal Grandmother   . Esophageal cancer Neg Hx   . Colon cancer Neg Hx   . Stomach cancer Neg Hx   . Rectal cancer Neg Hx     Social History   Socioeconomic History  . Marital status: Widowed    Spouse name: Not on file  . Number of children: 2  . Years of education: Not on file  . Highest education level: Not on file  Occupational History  . 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 Financial resource strain: Not on file  . Food insecurity:    Worry: Not on file    Inability: Not on file  . Transportation needs:    Medical: Not on file    Non-medical: Not on file  Tobacco Use  . Smoking status: Never Smoker  . Smokeless tobacco: Never Used  Substance and Sexual Activity  . Alcohol use: Yes    Comment: special events  . Drug use: No  . Sexual activity: Never  Lifestyle  . Physical activity:    Days per week: Not on file    Minutes per session: Not on file  . Stress: Not on file  Relationships  . Social connections:    Talks on phone: Not on file    Gets together: Not on file    Attends religious service: Not on file    Active member of club or organization: Not on file    Attends meetings of clubs or organizations: Not on file    Relationship status: Not on file  . Intimate partner violence:    Fear of current or ex partner: Not on file    Emotionally abused: Not on file    Physically abused: Not on file    Forced sexual activity: Not on file  Other Topics Concern  . Not on file  Social History Narrative   Widowed 12/19      Has living will   Children now would be health care POA   Would accept resuscitation but no prolonged ventilation   No tube feeds if cognitively unaware      2 daughters, 4 grandsons 1 great-grandson   Retired Medical sales representative from [place] Not sleeping well Not eating much--family trying to encourage her Has had some back pain---?from lifting on husband. No leg weakness    Objective:   Physical Exam  Constitutional: She appears well-developed. No distress.  HENT:  Mouth/Throat: Oropharynx is clear and moist. No oropharyngeal exudate.  Mild maxillary sinus tenderness Moderate nasal inflammation TMs normal  Neck: No thyromegaly present.  Respiratory: Effort normal and breath sounds normal. No respiratory distress. She has no wheezes. She has no rales.  Musculoskeletal:     Comments:  No spine tenderness SLR negative  Lymphadenopathy:    She has no cervical adenopathy.           Assessment & Plan:

## 2018-02-22 NOTE — Assessment & Plan Note (Signed)
Run down also--after husband's recent death Seems to be viral Discussed symptomatic Rx If worsens next week, will send Rx for amoxil

## 2018-02-22 NOTE — Patient Instructions (Signed)
Please let me know if you are worsening next week, I will send a prescription for amoxicillin.

## 2018-02-28 ENCOUNTER — Telehealth: Payer: Self-pay

## 2018-02-28 MED ORDER — AMOXICILLIN 500 MG PO TABS: 1000.0000 mg | ORAL_TABLET | Freq: Two times a day (BID) | ORAL | 0 refills | Status: AC

## 2018-02-28 NOTE — Telephone Encounter (Signed)
Let her know that I sent the prescription for the antibiotic

## 2018-02-28 NOTE — Telephone Encounter (Signed)
Advised pts daughter Rx sent to pharmacy

## 2018-02-28 NOTE — Telephone Encounter (Signed)
Pt was seen 02/22/18 and advised to cb; now pt has runny nose with blood tinged green mucus when blows nose; non prod cough, hoarse, on and off S/T, temp 101. Pt discussed at 02/22/18 appt if not better or worsening symptoms to cb for abx. Pt request abx to [facility].

## 2018-03-14 DIAGNOSIS — J3089 Other allergic rhinitis: Secondary | ICD-10-CM | POA: Diagnosis not present

## 2018-03-19 DIAGNOSIS — J3089 Other allergic rhinitis: Secondary | ICD-10-CM | POA: Diagnosis not present

## 2018-03-22 DIAGNOSIS — Z01419 Encounter for gynecological examination (general) (routine) without abnormal findings: Secondary | ICD-10-CM | POA: Diagnosis not present

## 2018-03-22 DIAGNOSIS — Z1389 Encounter for screening for other disorder: Secondary | ICD-10-CM | POA: Diagnosis not present

## 2018-03-22 DIAGNOSIS — Z6821 Body mass index (BMI) 21.0-21.9, adult: Secondary | ICD-10-CM | POA: Diagnosis not present

## 2018-03-22 DIAGNOSIS — N952 Postmenopausal atrophic vaginitis: Secondary | ICD-10-CM | POA: Diagnosis not present

## 2018-03-26 DIAGNOSIS — J3089 Other allergic rhinitis: Secondary | ICD-10-CM | POA: Diagnosis not present

## 2018-04-09 DIAGNOSIS — J3089 Other allergic rhinitis: Secondary | ICD-10-CM | POA: Diagnosis not present

## 2018-04-22 DIAGNOSIS — Z961 Presence of intraocular lens: Secondary | ICD-10-CM | POA: Diagnosis not present

## 2018-04-22 DIAGNOSIS — H1851 Endothelial corneal dystrophy: Secondary | ICD-10-CM | POA: Diagnosis not present

## 2018-04-25 DIAGNOSIS — J3089 Other allergic rhinitis: Secondary | ICD-10-CM | POA: Diagnosis not present

## 2018-04-26 DIAGNOSIS — J3089 Other allergic rhinitis: Secondary | ICD-10-CM | POA: Diagnosis not present

## 2018-05-07 DIAGNOSIS — K056 Periodontal disease, unspecified: Secondary | ICD-10-CM | POA: Diagnosis not present

## 2018-05-23 DIAGNOSIS — J3089 Other allergic rhinitis: Secondary | ICD-10-CM | POA: Diagnosis not present

## 2018-06-13 DIAGNOSIS — J3089 Other allergic rhinitis: Secondary | ICD-10-CM | POA: Diagnosis not present

## 2018-06-20 DIAGNOSIS — J3089 Other allergic rhinitis: Secondary | ICD-10-CM | POA: Diagnosis not present

## 2018-06-25 DIAGNOSIS — J301 Allergic rhinitis due to pollen: Secondary | ICD-10-CM | POA: Diagnosis not present

## 2018-07-04 DIAGNOSIS — J3089 Other allergic rhinitis: Secondary | ICD-10-CM | POA: Diagnosis not present

## 2018-07-05 ENCOUNTER — Encounter: Payer: Medicare Other | Admitting: Internal Medicine

## 2018-07-11 DIAGNOSIS — J3089 Other allergic rhinitis: Secondary | ICD-10-CM | POA: Diagnosis not present

## 2018-07-21 ENCOUNTER — Other Ambulatory Visit: Payer: Self-pay | Admitting: Internal Medicine

## 2018-07-23 ENCOUNTER — Telehealth: Payer: Self-pay | Admitting: Internal Medicine

## 2018-07-23 DIAGNOSIS — J3089 Other allergic rhinitis: Secondary | ICD-10-CM | POA: Diagnosis not present

## 2018-07-23 NOTE — Telephone Encounter (Signed)
Patient called. She had an AWV scheduled on 07/05/18 and we had to cancel the appointment due to covid 19. Patient said [facility] changed her eye surgery to 07/30/18.  Patient wanted to reschedule her AWV to before her surgery.  I rescheduled patient's appointment to 07/25/18 at 3:30.  Patient doesn't have video capability. I let her know that Dr.Letvak could do a phone call.  Patient would prefer to come in to the office.  I let her know I would ask Dr.Letvak. Please advise.

## 2018-07-24 NOTE — Telephone Encounter (Signed)
I spoke to patient and I let her know she can come in to the office for her AWV.  Patient will call when she arrives, so we can let her in the side door.

## 2018-07-24 NOTE — Telephone Encounter (Signed)
It is not optimal but I am okay with her coming into the office. We should try to not have come through the waiting room--just use our side door

## 2018-07-25 ENCOUNTER — Encounter: Payer: Medicare Other | Admitting: Internal Medicine

## 2018-07-25 ENCOUNTER — Telehealth: Payer: Self-pay | Admitting: Internal Medicine

## 2018-07-25 DIAGNOSIS — K0822 Moderate atrophy of the mandible: Secondary | ICD-10-CM | POA: Diagnosis not present
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
## 2018-07-25 NOTE — Telephone Encounter (Signed)
Sorry to hear about that Please check on her tomorrow

## 2018-07-26 NOTE — Telephone Encounter (Signed)
Awesome! Thank you!
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
## 2018-07-26 NOTE — Telephone Encounter (Signed)
I spoke to patient and she rescheduled appointment to 08/14/18 at 12:00.  Patient said she'll come to the side door.

## 2018-07-29 ENCOUNTER — Other Ambulatory Visit: Payer: Self-pay | Admitting: Internal Medicine

## 2018-07-30 ENCOUNTER — Other Ambulatory Visit: Payer: Self-pay | Admitting: Internal Medicine

## 2018-07-30 DIAGNOSIS — I10 Essential (primary) hypertension: Secondary | ICD-10-CM

## 2018-08-05 ENCOUNTER — Other Ambulatory Visit: Payer: Self-pay | Admitting: Internal Medicine

## 2018-08-13 DIAGNOSIS — J3089 Other allergic rhinitis: Secondary | ICD-10-CM | POA: Diagnosis not present

## 2018-08-14 ENCOUNTER — Other Ambulatory Visit: Payer: Self-pay

## 2018-08-14 ENCOUNTER — Ambulatory Visit (INDEPENDENT_AMBULATORY_CARE_PROVIDER_SITE_OTHER): Payer: Medicare Other | Admitting: Internal Medicine

## 2018-08-14 ENCOUNTER — Encounter: Payer: Self-pay | Admitting: Internal Medicine

## 2018-08-14 VITALS — BP 124/80 | HR 93 | Temp 98.1°F | Ht 62.25 in | Wt 124.0 lb

## 2018-08-14 DIAGNOSIS — Z7189 Other specified counseling: Secondary | ICD-10-CM | POA: Diagnosis not present

## 2018-08-14 DIAGNOSIS — I499 Cardiac arrhythmia, unspecified: Secondary | ICD-10-CM

## 2018-08-14 DIAGNOSIS — I10 Essential (primary) hypertension: Secondary | ICD-10-CM | POA: Diagnosis not present

## 2018-08-14 DIAGNOSIS — R7303 Prediabetes: Secondary | ICD-10-CM | POA: Diagnosis not present

## 2018-08-14 DIAGNOSIS — I4891 Unspecified atrial fibrillation: Secondary | ICD-10-CM | POA: Diagnosis not present

## 2018-08-14 DIAGNOSIS — Z0000 Encounter for general adult medical examination without abnormal findings: Secondary | ICD-10-CM | POA: Diagnosis not present

## 2018-08-14 DIAGNOSIS — F39 Unspecified mood [affective] disorder: Secondary | ICD-10-CM | POA: Diagnosis not present

## 2018-08-14 LAB — COMPREHENSIVE METABOLIC PANEL
ALT: 27 U/L (ref 0–35)
AST: 25 U/L (ref 0–37)
Albumin: 4.7 g/dL (ref 3.5–5.2)
Alkaline Phosphatase: 55 U/L (ref 39–117)
BUN: 15 mg/dL (ref 6–23)
CO2: 32 mEq/L (ref 19–32)
Calcium: 9.6 mg/dL (ref 8.4–10.5)
Chloride: 100 mEq/L (ref 96–112)
Creatinine, Ser: 0.6 mg/dL (ref 0.40–1.20)
GFR: 96.37 mL/min (ref >60.00)
Glucose, Bld: 108 mg/dL — ABNORMAL HIGH (ref 70–99)
Potassium: 3.8 mEq/L (ref 3.5–5.1)
Sodium: 140 mEq/L (ref 135–145)
Total Bilirubin: 1.7 mg/dL — ABNORMAL HIGH (ref 0.2–1.2)
Total Protein: 7.1 g/dL (ref 6.0–8.3)

## 2018-08-14 LAB — HEMOGLOBIN A1C: Hgb A1c MFr Bld: 5.8 % (ref 4.6–6.5)

## 2018-08-14 LAB — LIPID PANEL
Cholesterol: 135 mg/dL (ref 0–200)
HDL: 49.3 mg/dL (ref >39.00)
LDL Cholesterol: 71 mg/dL (ref 0–99)
NonHDL: 85.22
Total CHOL/HDL Ratio: 3
Triglycerides: 70 mg/dL (ref 0.0–149.0)
VLDL: 14 mg/dL (ref 0.0–40.0)

## 2018-08-14 LAB — CBC
HCT: 35.5 % — ABNORMAL LOW (ref 36.0–46.0)
Hemoglobin: 12 g/dL (ref 12.0–15.0)
MCHC: 34 g/dL (ref 30.0–36.0)
MCV: 95.4 fl (ref 78.0–100.0)
Platelets: 164 10*3/uL (ref 150.0–400.0)
RBC: 3.72 Mil/uL — ABNORMAL LOW (ref 3.87–5.11)
RDW: 17.7 % — ABNORMAL HIGH (ref 11.5–15.5)
WBC: 5.1 10*3/uL (ref 4.0–10.5)

## 2018-08-14 LAB — T4, FREE: Free T4: 1.01 ng/dL (ref 0.60–1.60)

## 2018-08-14 MED ORDER — APIXABAN 5 MG PO TABS: 5.0000 mg | ORAL_TABLET | Freq: Two times a day (BID) | ORAL | 11 refills | Status: DC

## 2018-08-14 NOTE — Progress Notes (Signed)
Hearing Screening   125Hz  250Hz  500Hz  1000Hz  2000Hz  3000Hz  4000Hz  6000Hz  8000Hz   Right ear:           Left ear:           Comments: Has hearing aids. Wearing them today.  Vision Screening Comments: May 2020

## 2018-08-14 NOTE — Assessment & Plan Note (Signed)
New diagnosis Rate is fine Explains her change in exercise tolerance EKG otherwise shows rate of 87, normal axis and intervals and no ischemic changes. Atrial fib is new since prior one Will start eliquis Cardiology evaluation for echo and consideration of cardioversion

## 2018-08-14 NOTE — Assessment & Plan Note (Signed)
Will recheck labs 

## 2018-08-14 NOTE — Assessment & Plan Note (Signed)
See social history 

## 2018-08-14 NOTE — Patient Instructions (Signed)
It is okay to hold the eliquis for 2 days before your planned eye surgery.

## 2018-08-14 NOTE — Progress Notes (Signed)
Subjective:    Patient ID: [name], female    DOB: May 22, 1940, 78 y.o.   MRN: [id number]  HPI Here for Medicare wellness and follow up of chronic health conditions Reviewed form and advanced directives Reviewed other doctors No alcohol or tobacco Stays busy around the house--but hasn't been able to go to the gym Vision not great---due for eye procedure (corneal repair) Poor hearing---aides help No falls Ongoing mood issues Independent with instrumental ADLs Memory seems to be fine  Walks her dogs regularly Having some trouble keeping up with her lab--legs get weak, will get SOB easily Only just got her in March Trouble holding her when she was chasing a squirrel No chest pain Does notice giving out easier on hills also No palpitations No dizziness or syncope  Still grieving for husband Regular sadness---but gets OOB, does what she has to do Some degree of anhedonia "I am working on it" Really hard for recent 60th anniversary  Reviewed labs Glucose normal last year--but she is still concerned  Continues on allergy shots and medications Gets headache and feels bad--going in and out on some hot days Tylenol will help No wheezing or SOB  Uses the famotidine prn only Once a week on average No dysphagia  Current Outpatient Medications on File Prior to Visit  Medication Sig Dispense Refill  . aspirin EC 81 MG tablet Take 81 mg by mouth every other day. 150 tablet 2  . azelastine (ASTELIN) 0.1 % nasal spray Place 1 spray into both nostrils 2 (two) times daily. Use in each nostril as directed 30 mL 5  . Bismuth Subsalicylate (PEPTO-BISMOL) 262 MG TABS Take by mouth as needed.    . Calcium Carbonate Antacid (TUMS PO) Take by mouth as needed.    . Calcium Carbonate-Vitamin D (CALCIUM 600+D) 600-400 MG-UNIT per tablet Take 1 tablet by mouth 2 (two) times daily.    . carvedilol (COREG) 12.5 MG tablet TAKE 1& 1/2 TABLET BY MOUTH TWICE A DAY 270 tablet 0  . famotidine  (PEPCID) 20 MG tablet Take 20 mg by mouth daily.    . fexofenadine (ALLEGRA) 180 MG tablet Take 180 mg by mouth daily.    Marland Kitchen glucosamine-chondroitin 500-400 MG tablet 2 am 1 pm    . guaiFENesin (MUCINEX) 600 MG 12 hr tablet Take 600 mg by mouth 2 (two) times daily as needed.    . hydrochlorothiazide (MICROZIDE) 12.5 MG capsule TAKE 1 CAPSULE BY MOUTH EVERY DAY 90 capsule 0  . Multiple Vitamin (MULTIVITAMIN) capsule Take 1 capsule by mouth daily.    . Omega-3 Fatty Acids (FISH OIL PO) Take 1 tablet by mouth daily.    . pravastatin (PRAVACHOL) 40 MG tablet TAKE 1 TABLET BY MOUTH EVERYDAY AT BEDTIME 90 tablet 3  . pyridoxine (B-6) 100 MG tablet Take 100 mg by mouth daily.    . sertraline (ZOLOFT) 25 MG tablet TAKE 1 TABLET BY MOUTH EVERY DAY 90 tablet 0  . triamcinolone (NASACORT) 55 MCG/ACT nasal inhaler Place 2 sprays into the nose daily. 1 Inhaler 12  . vitamin C (ASCORBIC ACID) 500 MG tablet Take 500 mg by mouth daily.     No current facility-administered medications on file prior to visit.     Allergies  Allergen Reactions  . Amlodipine Besy-Benazepril Hcl Swelling    Lotrel caused swelling of feet & facial swelling unilaterally  . Lotrel [Amlodipine Besy-Benazepril Hcl]   . Wasp Venom Swelling    Allergic to wasps    Past  Medical History:  Diagnosis Date  . Adenomatous colon polyp 2005  . Allergy    perennial ; shots from Dr Velora Heckler  . Aortic insufficiency   . Hyperlipidemia   . Hypertension   . Murmur    a.  04/2011 Echo:  EF 55-60%, mild-mod ai/mr  . Uterine cancer [facility])     Past Surgical History:  Procedure Laterality Date  . BLADDER SUSPENSION    . CARPAL TUNNEL RELEASE Left   . COLONOSCOPY  multiple  . GLAUCOMA SURGERY     "for narrow lines" (? narrow angle?)  . INTRAOCULAR LENS INSERTION Bilateral 10/22/2014; 11/26/2014  . TOTAL ABDOMINAL HYSTERECTOMY W/ BILATERAL SALPINGOOPHORECTOMY     abnormal PAP    Family History  Problem Relation Age of Onset  . Stroke  Mother 108  . Heart failure Mother   . Heart attack Father 89  . Lung cancer Father   . Cancer Maternal Aunt         X 2; Gyn & ? primary  . Diabetes Maternal Grandmother   . Esophageal cancer Neg Hx   . Colon cancer Neg Hx   . Stomach cancer Neg Hx   . Rectal cancer Neg Hx     Social History   Socioeconomic History  . Marital status: Widowed    Spouse name: Not on file  . Number of children: 2  . Years of education: Not on file  . Highest education level: Not on file  Occupational History  . 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 Financial resource strain: Not on file  . Food insecurity    Worry: Not on file    Inability: Not on file  . Transportation needs    Medical: Not on file    Non-medical: Not on file  Tobacco Use  . Smoking status: Never Smoker  . Smokeless tobacco: Never Used  Substance and Sexual Activity  . Alcohol use: Yes    Comment: special events  . Drug use: No  . Sexual activity: Never  Lifestyle  . Physical activity    Days per week: Not on file    Minutes per session: Not on file  . Stress: Not on file  Relationships  . Social Herbalist on phone: Not on file    Gets together: Not on file    Attends religious service: Not on file    Active member of club or organization: Not on file    Attends meetings of clubs or organizations: Not on file    Relationship status: Not on file  . Intimate partner violence    Fear of current or ex partner: Not on file    Emotionally abused: Not on file    Physically abused: Not on file    Forced sexual activity: Not on file  Other Topics Concern  . Not on file  Social History Narrative   Widowed 12/19      Has living will   Children now would be health care POA   Would accept resuscitation but no prolonged ventilation   No tube feeds if cognitively unaware      2 daughters, 4 grandsons 1 great-grandson   Retired Medical sales representative from [employer] popping when moving arms, etc --but no sig arthritis Appetite is not very good--but making herself eat Hasn't lost weight but discussed supplements Sleep is okay mostly--a few bad nights Wears seat  belt Bowels are fine. No blood. Occasional loose stools Voids okay. Occasional urge leakage---no pad Easy bruising---but no suspicious lesions    Objective:   Physical Exam  Constitutional: She is oriented to person, place, and time. She appears well-developed. No distress.  HENT:  Mouth/Throat: Oropharynx is clear and moist. No oropharyngeal exudate.  Neck: No thyromegaly present.  Cardiovascular: Normal rate. Exam reveals no gallop.  Irregular ?soft systolic murmur at apex  Respiratory: Effort normal and breath sounds normal. No respiratory distress. She has no wheezes. She has no rales.  GI: Soft. She exhibits no distension. There is no abdominal tenderness. There is no rebound and no guarding.  Musculoskeletal:        General: No tenderness or edema.  Lymphadenopathy:    She has no cervical adenopathy.  Neurological: She is alert and oriented to person, place, and time.  President--- "Dwaine Deter, Bush" [phone] [id number] Recall 3/3  Skin: No rash noted. No erythema.  Ecchymoses on arms  Psychiatric: She has a normal mood and affect. Her behavior is normal.           Assessment & Plan:

## 2018-08-14 NOTE — Assessment & Plan Note (Signed)
Sounds like atrial fib Will check EKG If so--would explain her change in exercise tolerance

## 2018-08-14 NOTE — Assessment & Plan Note (Signed)
Complicated grieving Discussed increasing the sertraline--will continue same dose for now

## 2018-08-14 NOTE — Assessment & Plan Note (Signed)
I have personally reviewed the Medicare Annual Wellness questionnaire and have noted 1. The patient's medical and social history 2. Their use of alcohol, tobacco or illicit drugs 3. Their current medications and supplements 4. The patient's functional ability including ADL's, fall risks, home safety risks and hearing or visual             impairment. 5. Diet and physical activities 6. Evidence for depression or mood disorders  The patients weight, height, BMI and visual acuity have been recorded in the chart I have made referrals, counseling and provided education to the patient based review of the above and I have provided the pt with a written personalized care plan for preventive services.  I have provided you with a copy of your personalized plan for preventive services. Please take the time to review along with your updated medication list.  Will get one last mammogram at end of year or next year Done with colon screening Discussed exercise Flu vaccine in the fall

## 2018-08-14 NOTE — Assessment & Plan Note (Signed)
BP Readings from Last 3 Encounters:  08/14/18 124/80  02/22/18 138/84  10/30/17 132/78   Good control

## 2018-08-16 ENCOUNTER — Telehealth: Payer: Self-pay | Admitting: Internal Medicine

## 2018-08-16 NOTE — Telephone Encounter (Signed)
Patient called saying that she went to go pick up her Eliquis yesterday but it was over $300. Patient is wanting to know if there is something else should could take for a lower price. Please advise. Patient phone number is [phone]

## 2018-08-16 NOTE — Telephone Encounter (Signed)
Pt has a deductible and has not met that yet. She is going to go ahead and get it. I discussed the pt asst program to her. She will get her stuff together and let me know. I told her I thought she could [place] Frank's meds for the year as well.

## 2018-08-18 DIAGNOSIS — Z1159 Encounter for screening for other viral diseases: Secondary | ICD-10-CM | POA: Diagnosis not present

## 2018-08-18 DIAGNOSIS — Z01812 Encounter for preprocedural laboratory examination: Secondary | ICD-10-CM | POA: Diagnosis not present

## 2018-08-20 DIAGNOSIS — H1851 Endothelial corneal dystrophy: Secondary | ICD-10-CM | POA: Diagnosis not present

## 2018-08-20 DIAGNOSIS — I517 Cardiomegaly: Secondary | ICD-10-CM | POA: Diagnosis not present

## 2018-08-26 DIAGNOSIS — I4892 Unspecified atrial flutter: Secondary | ICD-10-CM | POA: Diagnosis not present

## 2018-08-26 DIAGNOSIS — I4891 Unspecified atrial fibrillation: Secondary | ICD-10-CM | POA: Diagnosis not present

## 2018-08-26 DIAGNOSIS — R0789 Other chest pain: Secondary | ICD-10-CM | POA: Diagnosis not present

## 2018-09-03 DIAGNOSIS — J3089 Other allergic rhinitis: Secondary | ICD-10-CM | POA: Diagnosis not present

## 2018-09-11 DIAGNOSIS — I4891 Unspecified atrial fibrillation: Secondary | ICD-10-CM | POA: Diagnosis not present

## 2018-09-16 DIAGNOSIS — R0789 Other chest pain: Secondary | ICD-10-CM | POA: Diagnosis not present

## 2018-09-16 DIAGNOSIS — I4891 Unspecified atrial fibrillation: Secondary | ICD-10-CM | POA: Diagnosis not present

## 2018-09-16 DIAGNOSIS — I4892 Unspecified atrial flutter: Secondary | ICD-10-CM | POA: Diagnosis not present

## 2018-09-23 DIAGNOSIS — E782 Mixed hyperlipidemia: Secondary | ICD-10-CM | POA: Diagnosis not present

## 2018-09-23 DIAGNOSIS — I340 Nonrheumatic mitral (valve) insufficiency: Secondary | ICD-10-CM | POA: Diagnosis not present

## 2018-09-23 DIAGNOSIS — R072 Precordial pain: Secondary | ICD-10-CM | POA: Diagnosis not present

## 2018-09-23 DIAGNOSIS — I480 Paroxysmal atrial fibrillation: Secondary | ICD-10-CM | POA: Diagnosis not present

## 2018-09-23 DIAGNOSIS — R0602 Shortness of breath: Secondary | ICD-10-CM | POA: Diagnosis not present

## 2018-09-24 DIAGNOSIS — I340 Nonrheumatic mitral (valve) insufficiency: Secondary | ICD-10-CM | POA: Insufficient documentation

## 2018-09-26 DIAGNOSIS — J3089 Other allergic rhinitis: Secondary | ICD-10-CM | POA: Diagnosis not present

## 2018-10-02 DIAGNOSIS — Z961 Presence of intraocular lens: Secondary | ICD-10-CM | POA: Diagnosis not present

## 2018-10-03 DIAGNOSIS — J3089 Other allergic rhinitis: Secondary | ICD-10-CM | POA: Diagnosis not present

## 2018-10-15 ENCOUNTER — Other Ambulatory Visit: Payer: Self-pay | Admitting: Internal Medicine

## 2018-10-22 ENCOUNTER — Other Ambulatory Visit: Payer: Self-pay | Admitting: Internal Medicine

## 2018-10-22 DIAGNOSIS — I10 Essential (primary) hypertension: Secondary | ICD-10-CM

## 2018-10-22 DIAGNOSIS — J3089 Other allergic rhinitis: Secondary | ICD-10-CM | POA: Diagnosis not present

## 2018-11-07 DIAGNOSIS — J3089 Other allergic rhinitis: Secondary | ICD-10-CM | POA: Diagnosis not present

## 2018-11-22 ENCOUNTER — Encounter: Payer: Self-pay | Admitting: Internal Medicine

## 2018-11-22 DIAGNOSIS — Z1231 Encounter for screening mammogram for malignant neoplasm of breast: Secondary | ICD-10-CM | POA: Diagnosis not present

## 2018-11-26 DIAGNOSIS — J3089 Other allergic rhinitis: Secondary | ICD-10-CM | POA: Diagnosis not present

## 2018-12-05 ENCOUNTER — Ambulatory Visit (INDEPENDENT_AMBULATORY_CARE_PROVIDER_SITE_OTHER): Payer: Medicare Other

## 2018-12-05 DIAGNOSIS — Z23 Encounter for immunization: Secondary | ICD-10-CM | POA: Diagnosis not present

## 2018-12-12 DIAGNOSIS — J3089 Other allergic rhinitis: Secondary | ICD-10-CM | POA: Diagnosis not present

## 2018-12-23 ENCOUNTER — Telehealth: Payer: Self-pay | Admitting: Internal Medicine

## 2018-12-23 NOTE — Telephone Encounter (Signed)
Spoke to pt. I advised her she has had both. She can discuss it with him at her upcoming visit in December.

## 2018-12-23 NOTE — Telephone Encounter (Signed)
Pt called wanting to know how often she needs to get pneumonia and when did she have her last one  Please advise

## 2018-12-31 DIAGNOSIS — J3089 Other allergic rhinitis: Secondary | ICD-10-CM | POA: Diagnosis not present

## 2019-01-01 DIAGNOSIS — H35371 Puckering of macula, right eye: Secondary | ICD-10-CM | POA: Diagnosis not present

## 2019-01-16 DIAGNOSIS — J3089 Other allergic rhinitis: Secondary | ICD-10-CM | POA: Diagnosis not present

## 2019-02-12 ENCOUNTER — Ambulatory Visit: Payer: Medicare Other | Admitting: Internal Medicine

## 2019-02-14 ENCOUNTER — Ambulatory Visit (INDEPENDENT_AMBULATORY_CARE_PROVIDER_SITE_OTHER): Payer: Medicare Other | Admitting: Internal Medicine

## 2019-02-14 ENCOUNTER — Other Ambulatory Visit: Payer: Self-pay

## 2019-02-14 ENCOUNTER — Encounter: Payer: Self-pay | Admitting: Internal Medicine

## 2019-02-14 DIAGNOSIS — I480 Paroxysmal atrial fibrillation: Secondary | ICD-10-CM

## 2019-02-14 DIAGNOSIS — F39 Unspecified mood [affective] disorder: Secondary | ICD-10-CM | POA: Diagnosis not present

## 2019-02-14 NOTE — Assessment & Plan Note (Signed)
Has not had any documented atrial fib since my office I can't see the results of the monitor She will continue the eliquis for now Follow up with Dr Nehemiah Massed

## 2019-02-14 NOTE — Assessment & Plan Note (Signed)
Ongoing complicated grieving Some better now Will continue the medication

## 2019-02-14 NOTE — Progress Notes (Signed)
Subjective:    Patient ID: [name], female    DOB: Aug 14, 1940, 78 y.o.   MRN: [id number]  HPI Here for follow up of mood issues and atrial fibrillation  This visit occurred during the SARS-CoV-2 public health emergency.  Safety protocols were in place, including screening questions prior to the visit, additional usage of staff PPE, and extensive cleaning of exam room while observing appropriate contact time as indicated for disinfecting solutions.   Has seen Dr Nehemiah Massed about the atrial fib Wasn't seen on EKG when she had eye surgery Got monitor from cardiology Stress test and monitor done--echo stress was normal (but has MR/TR)  Not wearing out as much when walking No chest pain  Mood is maybe "some better" Bad time of the year now though Anniversary is coming up of his death  Current Outpatient Medications on File Prior to Visit  Medication Sig Dispense Refill  . apixaban (ELIQUIS) 5 MG TABS tablet Take 1 tablet (5 mg total) by mouth 2 (two) times daily. 60 tablet 11  . azelastine (ASTELIN) 0.1 % nasal spray Place 1 spray into both nostrils 2 (two) times daily. Use in each nostril as directed 30 mL 5  . Bismuth Subsalicylate (PEPTO-BISMOL) 262 MG TABS Take by mouth as needed.    . Calcium Carbonate Antacid (TUMS PO) Take by mouth as needed.    . Calcium Carbonate-Vitamin D (CALCIUM 600+D) 600-400 MG-UNIT per tablet Take 1 tablet by mouth 2 (two) times daily.    . carvedilol (COREG) 12.5 MG tablet TAKE 1& 1/2 TABLET BY MOUTH TWICE A DAY 270 tablet 3  . famotidine (PEPCID) 20 MG tablet Take 20 mg by mouth daily.    . fexofenadine (ALLEGRA) 180 MG tablet Take 180 mg by mouth daily.    Marland Kitchen glucosamine-chondroitin 500-400 MG tablet 2 am 1 pm    . hydrochlorothiazide (MICROZIDE) 12.5 MG capsule TAKE 1 CAPSULE BY MOUTH EVERY DAY 90 capsule 3  . Multiple Vitamin (MULTIVITAMIN) capsule Take 1 capsule by mouth daily.    . Omega-3 Fatty Acids (FISH OIL PO) Take 1 tablet by mouth  daily.    . pravastatin (PRAVACHOL) 40 MG tablet TAKE 1 TABLET BY MOUTH EVERYDAY AT BEDTIME 90 tablet 3  . pyridoxine (B-6) 100 MG tablet Take 100 mg by mouth daily.    . sertraline (ZOLOFT) 25 MG tablet TAKE 1 TABLET BY MOUTH EVERY DAY 90 tablet 3  . triamcinolone (NASACORT) 55 MCG/ACT nasal inhaler Place 2 sprays into the nose daily. 1 Inhaler 12  . vitamin C (ASCORBIC ACID) 500 MG tablet Take 500 mg by mouth daily.    Marland Kitchen guaiFENesin (MUCINEX) 600 MG 12 hr tablet Take 600 mg by mouth 2 (two) times daily as needed.     No current facility-administered medications on file prior to visit.    Allergies  Allergen Reactions  . Amlodipine Besy-Benazepril Hcl Swelling    Lotrel caused swelling of feet & facial swelling unilaterally  . Lotrel [Amlodipine Besy-Benazepril Hcl]   . Wasp Venom Swelling    Allergic to wasps    Past Medical History:  Diagnosis Date  . Adenomatous colon polyp 2005  . Allergy    perennial ; shots from Dr Velora Heckler  . Aortic insufficiency   . Hyperlipidemia   . Hypertension   . Murmur    a.  04/2011 Echo:  EF 55-60%, mild-mod ai/mr  . Uterine cancer [facility])     Past Surgical History:  Procedure Laterality Date  .  BLADDER SUSPENSION    . CARPAL TUNNEL RELEASE Left   . COLONOSCOPY  multiple  . GLAUCOMA SURGERY     "for narrow lines" (? narrow angle?)  . INTRAOCULAR LENS INSERTION Bilateral 10/22/2014; 11/26/2014  . TOTAL ABDOMINAL HYSTERECTOMY W/ BILATERAL SALPINGOOPHORECTOMY     abnormal PAP    Family History  Problem Relation Age of Onset  . Stroke Mother 96  . Heart failure Mother   . Heart attack Father 35  . Lung cancer Father   . Cancer Maternal Aunt         X 2; Gyn & ? primary  . Diabetes Maternal Grandmother   . Esophageal cancer Neg Hx   . Colon cancer Neg Hx   . Stomach cancer Neg Hx   . Rectal cancer Neg Hx     Social History   Socioeconomic History  . Marital status: Widowed    Spouse name: Not on file  . Number of children: 2    . Years of education: Not on file  . Highest education level: Not on file  Occupational History  . 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 Smoking status: Never Smoker  . Smokeless tobacco: Never Used  Substance and Sexual Activity  . Alcohol use: Yes    Comment: special events  . Drug use: No  . Sexual activity: Never  Other Topics Concern  . Not on file  Social History Narrative   Widowed 12/19      Has living will   Children now would be health care POA   Would accept resuscitation but no prolonged ventilation   No tube feeds if cognitively unaware      2 daughters, 4 grandsons 1 great-grandson   Retired Medical sales representative from [employer] Strain:   . Difficulty of Paying Living Expenses: Not on file  Food Insecurity:   . Worried About Charity fundraiser in the Last Year: Not on file  . Ran Out of Food in the Last Year: Not on file  Transportation Needs:   . Lack of Transportation (Medical): Not on file  . Lack of Transportation (Non-Medical): Not on file  Physical Activity:   . Days of Exercise per Week: Not on file  . Minutes of Exercise per Session: Not on file  Stress:   . Feeling of Stress : Not on file  Social Connections:   . Frequency of Communication with Friends and Family: Not on file  . Frequency of Social Gatherings with Friends and Family: Not on file  . Attends Religious Services: Not on file  . Active Member of Clubs or Organizations: Not on file  . Attends Archivist Meetings: Not on file  . Marital Status: Not on file  Intimate Partner Violence:   . Fear of Current or Ex-Partner: Not on file  . Emotionally Abused: Not on file  . Physically Abused: Not on file  . Sexually Abused: Not on file   Review of Systems Working on dental implants Eating fair Weight stable Sleep is variable    Objective:   Physical Exam  Constitutional: She appears  well-developed. No distress.  Cardiovascular:  Slow and regular Faint apical systolic murmur  Respiratory: Effort normal and breath sounds normal. No respiratory distress. She has no wheezes. She has no rales.  Musculoskeletal:        General: No edema.  Psychiatric: She has  a normal mood and affect. Her behavior is normal.           Assessment & Plan:

## 2019-03-17 ENCOUNTER — Encounter: Payer: Self-pay | Admitting: Internal Medicine

## 2019-03-27 DIAGNOSIS — J3089 Other allergic rhinitis: Secondary | ICD-10-CM | POA: Diagnosis not present

## 2019-04-01 DIAGNOSIS — J3089 Other allergic rhinitis: Secondary | ICD-10-CM | POA: Diagnosis not present

## 2019-04-22 DIAGNOSIS — J3089 Other allergic rhinitis: Secondary | ICD-10-CM | POA: Diagnosis not present

## 2019-05-08 DIAGNOSIS — J3089 Other allergic rhinitis: Secondary | ICD-10-CM | POA: Diagnosis not present

## 2019-05-14 DIAGNOSIS — J3089 Other allergic rhinitis: Secondary | ICD-10-CM | POA: Diagnosis not present

## 2019-05-22 DIAGNOSIS — Z1389 Encounter for screening for other disorder: Secondary | ICD-10-CM | POA: Diagnosis not present

## 2019-05-22 DIAGNOSIS — Z01419 Encounter for gynecological examination (general) (routine) without abnormal findings: Secondary | ICD-10-CM | POA: Diagnosis not present

## 2019-05-27 DIAGNOSIS — J3089 Other allergic rhinitis: Secondary | ICD-10-CM | POA: Diagnosis not present

## 2019-06-12 DIAGNOSIS — J3089 Other allergic rhinitis: Secondary | ICD-10-CM | POA: Diagnosis not present

## 2019-06-19 DIAGNOSIS — J3089 Other allergic rhinitis: Secondary | ICD-10-CM | POA: Diagnosis not present

## 2019-06-24 DIAGNOSIS — J3089 Other allergic rhinitis: Secondary | ICD-10-CM | POA: Diagnosis not present

## 2019-06-24 DIAGNOSIS — H1045 Other chronic allergic conjunctivitis: Secondary | ICD-10-CM | POA: Diagnosis not present

## 2019-06-24 DIAGNOSIS — J3081 Allergic rhinitis due to animal (cat) (dog) hair and dander: Secondary | ICD-10-CM | POA: Diagnosis not present

## 2019-07-01 DIAGNOSIS — J3089 Other allergic rhinitis: Secondary | ICD-10-CM | POA: Diagnosis not present

## 2019-07-17 DIAGNOSIS — J3089 Other allergic rhinitis: Secondary | ICD-10-CM | POA: Diagnosis not present

## 2019-08-05 DIAGNOSIS — J3089 Other allergic rhinitis: Secondary | ICD-10-CM | POA: Diagnosis not present

## 2019-08-06 ENCOUNTER — Other Ambulatory Visit: Payer: Self-pay | Admitting: Internal Medicine

## 2019-08-20 ENCOUNTER — Ambulatory Visit: Payer: Medicare Other | Admitting: Internal Medicine

## 2019-09-02 ENCOUNTER — Emergency Department: Payer: Medicare Other

## 2019-09-02 ENCOUNTER — Other Ambulatory Visit: Payer: Self-pay

## 2019-09-02 ENCOUNTER — Inpatient Hospital Stay
Admission: EM | Admit: 2019-09-02 | Discharge: 2019-09-04 | DRG: 310 | Disposition: A | Discharge status: Home / Self Care | Payer: Medicare Other | Attending: Family Medicine | Admitting: Family Medicine

## 2019-09-02 ENCOUNTER — Observation Stay: Payer: Medicare Other

## 2019-09-02 DIAGNOSIS — I10 Essential (primary) hypertension: Secondary | ICD-10-CM | POA: Diagnosis not present

## 2019-09-02 DIAGNOSIS — R9082 White matter disease, unspecified: Secondary | ICD-10-CM | POA: Diagnosis not present

## 2019-09-02 DIAGNOSIS — K219 Gastro-esophageal reflux disease without esophagitis: Secondary | ICD-10-CM | POA: Diagnosis present

## 2019-09-02 DIAGNOSIS — Z79899 Other long term (current) drug therapy: Secondary | ICD-10-CM

## 2019-09-02 DIAGNOSIS — I482 Chronic atrial fibrillation, unspecified: Secondary | ICD-10-CM | POA: Diagnosis not present

## 2019-09-02 DIAGNOSIS — I4891 Unspecified atrial fibrillation: Secondary | ICD-10-CM | POA: Diagnosis not present

## 2019-09-02 DIAGNOSIS — I7090 Unspecified atherosclerosis: Secondary | ICD-10-CM | POA: Diagnosis not present

## 2019-09-02 DIAGNOSIS — Z8542 Personal history of malignant neoplasm of other parts of uterus: Secondary | ICD-10-CM

## 2019-09-02 DIAGNOSIS — R42 Dizziness and giddiness: Secondary | ICD-10-CM | POA: Diagnosis not present

## 2019-09-02 DIAGNOSIS — S79912A Unspecified injury of left hip, initial encounter: Secondary | ICD-10-CM | POA: Diagnosis not present

## 2019-09-02 DIAGNOSIS — E785 Hyperlipidemia, unspecified: Secondary | ICD-10-CM | POA: Diagnosis present

## 2019-09-02 DIAGNOSIS — I959 Hypotension, unspecified: Secondary | ICD-10-CM | POA: Diagnosis not present

## 2019-09-02 DIAGNOSIS — R55 Syncope and collapse: Secondary | ICD-10-CM | POA: Diagnosis present

## 2019-09-02 DIAGNOSIS — R001 Bradycardia, unspecified: Principal | ICD-10-CM | POA: Diagnosis present

## 2019-09-02 DIAGNOSIS — W19XXXA Unspecified fall, initial encounter: Secondary | ICD-10-CM

## 2019-09-02 DIAGNOSIS — Z86010 Personal history of colonic polyps: Secondary | ICD-10-CM

## 2019-09-02 DIAGNOSIS — I6782 Cerebral ischemia: Secondary | ICD-10-CM | POA: Diagnosis not present

## 2019-09-02 DIAGNOSIS — Z888 Allergy status to other drugs, medicaments and biological substances status: Secondary | ICD-10-CM

## 2019-09-02 DIAGNOSIS — Z823 Family history of stroke: Secondary | ICD-10-CM

## 2019-09-02 DIAGNOSIS — Y92009 Unspecified place in unspecified non-institutional (private) residence as the place of occurrence of the external cause: Secondary | ICD-10-CM

## 2019-09-02 DIAGNOSIS — Z8249 Family history of ischemic heart disease and other diseases of the circulatory system: Secondary | ICD-10-CM

## 2019-09-02 DIAGNOSIS — Z833 Family history of diabetes mellitus: Secondary | ICD-10-CM

## 2019-09-02 DIAGNOSIS — Z20822 Contact with and (suspected) exposure to covid-19: Secondary | ICD-10-CM | POA: Diagnosis present

## 2019-09-02 DIAGNOSIS — Z91030 Bee allergy status: Secondary | ICD-10-CM

## 2019-09-02 DIAGNOSIS — Z7901 Long term (current) use of anticoagulants: Secondary | ICD-10-CM

## 2019-09-02 DIAGNOSIS — F32A Depression, unspecified: Secondary | ICD-10-CM | POA: Diagnosis present

## 2019-09-02 DIAGNOSIS — E782 Mixed hyperlipidemia: Secondary | ICD-10-CM | POA: Diagnosis present

## 2019-09-02 DIAGNOSIS — F329 Major depressive disorder, single episode, unspecified: Secondary | ICD-10-CM | POA: Diagnosis present

## 2019-09-02 LAB — CBC WITH DIFFERENTIAL/PLATELET
Abs Immature Granulocytes: 0.02 10*3/uL (ref 0.00–0.07)
Basophils Absolute: 0.1 10*3/uL (ref 0.0–0.1)
Basophils Relative: 2 %
Eosinophils Absolute: 0.2 10*3/uL (ref 0.0–0.5)
Eosinophils Relative: 4 %
HCT: 34.1 % — ABNORMAL LOW (ref 36.0–46.0)
Hemoglobin: 12.1 g/dL (ref 12.0–15.0)
Immature Granulocytes: 0 %
Lymphocytes Relative: 31 %
Lymphs Abs: 1.6 10*3/uL (ref 0.7–4.0)
MCH: 32.8 pg (ref 26.0–34.0)
MCHC: 35.5 g/dL (ref 30.0–36.0)
MCV: 92.4 fL (ref 80.0–100.0)
Monocytes Absolute: 0.6 10*3/uL (ref 0.1–1.0)
Monocytes Relative: 11 %
Neutro Abs: 2.7 10*3/uL (ref 1.7–7.7)
Neutrophils Relative %: 52 %
Platelets: 205 10*3/uL (ref 150–400)
RBC: 3.69 MIL/uL — ABNORMAL LOW (ref 3.87–5.11)
RDW: 14.6 % (ref 11.5–15.5)
WBC: 5.2 10*3/uL (ref 4.0–10.5)
nRBC: 0.6 % — ABNORMAL HIGH (ref 0.0–0.2)

## 2019-09-02 LAB — COMPREHENSIVE METABOLIC PANEL
ALT: 21 U/L (ref 0–44)
AST: 28 U/L (ref 15–41)
Albumin: 4.4 g/dL (ref 3.5–5.0)
Alkaline Phosphatase: 40 U/L (ref 38–126)
Anion gap: 12 (ref 5–15)
BUN: 19 mg/dL (ref 8–23)
CO2: 26 mmol/L (ref 22–32)
Calcium: 9.1 mg/dL (ref 8.9–10.3)
Chloride: 100 mmol/L (ref 98–111)
Creatinine, Ser: 0.72 mg/dL (ref 0.44–1.00)
GFR calc Af Amer: >60 mL/min (ref >60)
GFR calc non Af Amer: >60 mL/min (ref >60)
Glucose, Bld: 127 mg/dL — ABNORMAL HIGH (ref 70–99)
Potassium: 3.5 mmol/L (ref 3.5–5.1)
Sodium: 138 mmol/L (ref 135–145)
Total Bilirubin: 1.4 mg/dL — ABNORMAL HIGH (ref 0.3–1.2)
Total Protein: 7.3 g/dL (ref 6.5–8.1)

## 2019-09-02 LAB — MAGNESIUM: Magnesium: 2 mg/dL (ref 1.7–2.4)

## 2019-09-02 LAB — ETHANOL: Alcohol, Ethyl (B): <10 mg/dL (ref <10)

## 2019-09-02 LAB — URINALYSIS, COMPLETE (UACMP) WITH MICROSCOPIC
Bacteria, UA: NONE SEEN
Bilirubin Urine: NEGATIVE
Glucose, UA: NEGATIVE mg/dL
Hgb urine dipstick: NEGATIVE
Ketones, ur: NEGATIVE mg/dL
Leukocytes,Ua: NEGATIVE
Nitrite: NEGATIVE
Protein, ur: 30 mg/dL — AB
Specific Gravity, Urine: 1.015 (ref 1.005–1.030)
pH: 8 (ref 5.0–8.0)

## 2019-09-02 LAB — TSH: TSH: 4.983 u[IU]/mL — ABNORMAL HIGH (ref 0.350–4.500)

## 2019-09-02 LAB — TROPONIN I (HIGH SENSITIVITY)
Troponin I (High Sensitivity): 8 ng/L (ref <18)
Troponin I (High Sensitivity): 8 ng/L (ref <18)

## 2019-09-02 MED ORDER — VITAMIN B-6 50 MG PO TABS
100.0000 mg | ORAL_TABLET | Freq: Every day | ORAL | Status: DC
  Administered 2019-09-03 – 2019-09-04 (×2): 100 mg via ORAL
  Filled 2019-09-02 (×2): qty 2

## 2019-09-02 MED ORDER — LORATADINE 10 MG PO TABS
10.0000 mg | ORAL_TABLET | Freq: Every day | ORAL | Status: DC
  Administered 2019-09-02 – 2019-09-04 (×2): 10 mg via ORAL
  Filled 2019-09-02 (×2): qty 1

## 2019-09-02 MED ORDER — ADULT MULTIVITAMIN W/MINERALS CH
1.0000 | ORAL_TABLET | Freq: Every day | ORAL | Status: DC
  Administered 2019-09-04: 1 via ORAL
  Filled 2019-09-02: qty 1

## 2019-09-02 MED ORDER — SERTRALINE HCL 50 MG PO TABS
25.0000 mg | ORAL_TABLET | Freq: Every day | ORAL | Status: DC
  Administered 2019-09-02 – 2019-09-04 (×3): 25 mg via ORAL
  Filled 2019-09-02 (×3): qty 1

## 2019-09-02 MED ORDER — FAMOTIDINE 20 MG PO TABS
20.0000 mg | ORAL_TABLET | Freq: Every day | ORAL | Status: DC
  Administered 2019-09-02 – 2019-09-04 (×2): 20 mg via ORAL
  Filled 2019-09-02 (×2): qty 1

## 2019-09-02 MED ORDER — APIXABAN 2.5 MG PO TABS
2.5000 mg | ORAL_TABLET | Freq: Two times a day (BID) | ORAL | Status: DC
  Administered 2019-09-02 – 2019-09-04 (×4): 2.5 mg via ORAL
  Filled 2019-09-02 (×6): qty 1

## 2019-09-02 MED ORDER — GUAIFENESIN ER 600 MG PO TB12: 600.0000 mg | ORAL_TABLET | Freq: Two times a day (BID) | ORAL | Status: DC | PRN

## 2019-09-02 MED ORDER — SODIUM CHLORIDE 0.9 % IV SOLN: INTRAVENOUS | Status: DC

## 2019-09-02 MED ORDER — HYDRALAZINE HCL 25 MG PO TABS
25.0000 mg | ORAL_TABLET | Freq: Three times a day (TID) | ORAL | Status: DC
  Administered 2019-09-02 – 2019-09-04 (×4): 25 mg via ORAL
  Filled 2019-09-02 (×4): qty 1

## 2019-09-02 MED ORDER — ONDANSETRON HCL 4 MG/2ML IJ SOLN: 4.0000 mg | Freq: Three times a day (TID) | INTRAMUSCULAR | Status: DC | PRN

## 2019-09-02 MED ORDER — AZELASTINE HCL 0.1 % NA SOLN
1.0000 | Freq: Two times a day (BID) | NASAL | Status: DC
  Administered 2019-09-03 – 2019-09-04 (×2): 1 via NASAL
  Filled 2019-09-02: qty 30

## 2019-09-02 MED ORDER — SODIUM CHLORIDE 0.9 % IV BOLUS
500.0000 mL | Freq: Once | INTRAVENOUS | Status: AC
  Administered 2019-09-02: 500 mL via INTRAVENOUS

## 2019-09-02 MED ORDER — PRAVASTATIN SODIUM 40 MG PO TABS
40.0000 mg | ORAL_TABLET | Freq: Every day | ORAL | Status: DC
  Administered 2019-09-03: 40 mg via ORAL
  Filled 2019-09-02: qty 1

## 2019-09-02 MED ORDER — OMEGA-3-ACID ETHYL ESTERS 1 G PO CAPS
1.0000 g | ORAL_CAPSULE | Freq: Every day | ORAL | Status: DC
  Administered 2019-09-03: 1 g via ORAL
  Filled 2019-09-02 (×2): qty 1

## 2019-09-02 MED ORDER — HYDRALAZINE HCL 20 MG/ML IJ SOLN: 5.0000 mg | INTRAMUSCULAR | Status: DC | PRN

## 2019-09-02 MED ORDER — ACETAMINOPHEN 325 MG PO TABS
650.0000 mg | ORAL_TABLET | Freq: Four times a day (QID) | ORAL | Status: DC | PRN
  Administered 2019-09-03 – 2019-09-04 (×2): 650 mg via ORAL
  Filled 2019-09-02 (×2): qty 2

## 2019-09-02 MED ORDER — ASCORBIC ACID 500 MG PO TABS
500.0000 mg | ORAL_TABLET | Freq: Every day | ORAL | Status: DC
  Administered 2019-09-02 – 2019-09-04 (×3): 500 mg via ORAL
  Filled 2019-09-02 (×3): qty 1

## 2019-09-02 MED ORDER — SODIUM CHLORIDE 0.9% FLUSH
3.0000 mL | Freq: Two times a day (BID) | INTRAVENOUS | Status: DC
  Administered 2019-09-03: 3 mL via INTRAVENOUS

## 2019-09-02 MED ORDER — CALCIUM CARBONATE-VITAMIN D 500-200 MG-UNIT PO TABS
1.0000 | ORAL_TABLET | Freq: Two times a day (BID) | ORAL | Status: DC
  Administered 2019-09-03 – 2019-09-04 (×3): 1 via ORAL
  Filled 2019-09-02 (×5): qty 1

## 2019-09-02 NOTE — ED Notes (Signed)
assisted pt to restroom.

## 2019-09-02 NOTE — ED Notes (Signed)
Patient transported to CT 

## 2019-09-02 NOTE — ED Notes (Signed)
Patient transported to X-ray 

## 2019-09-02 NOTE — ED Provider Notes (Signed)
[facility] Emergency Department Provider Note  ____________________________________________  Time seen: Approximately 1:00 PM  I have reviewed the triage vital signs and the nursing notes.   HISTORY  Chief Complaint Loss of Consciousness    HPI [name] is a 79 y.o. female with a history of hypertension, atrial fibrillation, GERD who comes the ED today after syncope at home.  She reports being in her usual state of health, no prodromal symptoms when she suddenly felt lightheaded and had vision darkening and passed out.  On waking up she had fallen onto her left hip and reported left hip pain.  She was able to stand and walk.  Denies any preceding palpitations chest pain shortness of breath acute headache back pain abdominal pain.  No recent fever.  She has been compliant with her medications.  Left hip pain is worse with movement, resolves when lying still, located in the left lateral hip, nonradiating.   Sees cardiology Dr. Nehemiah Massed.  On Eliquis 2.5 mg twice daily for atrial fibrillation  She has been on carvedilol 18 mg twice daily for many years.  Last dose was last night, none today.   Past Medical History:  Diagnosis Date   Adenomatous colon polyp 2005   Allergy    perennial ; shots from Dr Velora Heckler   Aortic insufficiency    Hyperlipidemia    Hypertension    Murmur    a.  04/2011 Echo:  EF 55-60%, mild-mod ai/mr   Uterine cancer [facility])      Patient Active Problem List   Diagnosis Date Noted   Atrial fibrillation ([place]) 08/14/2018   Mood disorder ([place]) 06/21/2017   Preventative health care 05/31/2016   Advance directive discussed with patient 05/31/2016   Aortic insufficiency 06/08/2011   GERD 04/20/2009   RAYNAUD'S SYNDROME, HX OF 04/20/2009   Prediabetes 01/07/2008   History of colonic polyps 01/07/2008   HYPERLIPIDEMIA 12/14/2006   Essential hypertension 12/14/2006   Allergic rhinitis due to pollen 12/14/2006    SYMPTOM, INCONTINENCE, MIXED, URGE/STRESS 12/14/2006     Past Surgical History:  Procedure Laterality Date   BLADDER SUSPENSION     CARPAL TUNNEL RELEASE Left    COLONOSCOPY  multiple   GLAUCOMA SURGERY     "for narrow lines" (? narrow angle?)   INTRAOCULAR LENS INSERTION Bilateral 10/22/2014; 11/26/2014   TOTAL ABDOMINAL HYSTERECTOMY W/ BILATERAL SALPINGOOPHORECTOMY     abnormal PAP     Prior to Admission medications   Medication Sig Start Date End Date Taking? Authorizing Provider  apixaban (ELIQUIS) 5 MG TABS tablet Take 1 tablet (5 mg total) by mouth 2 (two) times daily. 08/14/18   Venia Carbon, MD  azelastine (ASTELIN) 0.1 % nasal spray Place 1 spray into both nostrils 2 (two) times daily. Use in each nostril as directed 06/21/17   Venia Carbon, MD  Bismuth Subsalicylate (PEPTO-BISMOL) 262 MG TABS Take by mouth as needed.    [provider]  Calcium Carbonate Antacid (TUMS PO) Take by mouth as needed.    [provider]  Calcium Carbonate-Vitamin D (CALCIUM 600+D) 600-400 MG-UNIT per tablet Take 1 tablet by mouth 2 (two) times daily.    [provider]  carvedilol (COREG) 12.5 MG tablet TAKE 1& 1/2 TABLET BY MOUTH TWICE A DAY 10/22/18   Viviana Simpler I, MD  famotidine (PEPCID) 20 MG tablet Take 20 mg by mouth daily.    [provider]  fexofenadine (ALLEGRA) 180 MG tablet Take 180 mg by mouth  daily.    [provider]  glucosamine-chondroitin 500-400 MG tablet 2 am 1 pm    [provider]  guaiFENesin (MUCINEX) 600 MG 12 hr tablet Take 600 mg by mouth 2 (two) times daily as needed.    [provider]  hydrochlorothiazide (MICROZIDE) 12.5 MG capsule TAKE 1 CAPSULE BY MOUTH EVERY DAY 10/22/18   Venia Carbon, MD  Multiple Vitamin (MULTIVITAMIN) capsule Take 1 capsule by mouth daily.    [provider]  Omega-3 Fatty Acids (FISH OIL PO) Take 1 tablet by mouth daily.    [provider]  pravastatin (PRAVACHOL) 40 MG tablet TAKE 1 TABLET BY MOUTH EVERYDAY AT BEDTIME 08/06/19   Viviana Simpler I, MD  pyridoxine (B-6) 100 MG tablet Take 100 mg by mouth daily.    [provider]  sertraline (ZOLOFT) 25 MG tablet TAKE 1 TABLET BY MOUTH EVERY DAY 10/15/18   Venia Carbon, MD  triamcinolone (NASACORT) 55 MCG/ACT nasal inhaler Place 2 sprays into the nose daily. 04/10/11 02/27/97  Hendricks Limes, MD  vitamin C (ASCORBIC ACID) 500 MG tablet Take 500 mg by mouth daily.    [provider]     Allergies Amlodipine besy-benazepril hcl, Lotrel [amlodipine besy-benazepril hcl], and Wasp venom   Family History  Problem Relation Age of Onset   Stroke Mother 27   Heart failure Mother    Heart attack Father 31   Lung cancer Father    Cancer Maternal Aunt         X 2; Gyn & ? primary   Diabetes Maternal Grandmother    Esophageal cancer Neg Hx    Colon cancer Neg Hx    Stomach cancer Neg Hx    Rectal cancer Neg Hx     Social History Social History   Tobacco Use   Smoking status: Never Smoker   Smokeless tobacco: Never Used  Scientific laboratory technician Use: Never used  Substance Use Topics   Alcohol use: Yes    Comment: special events   Drug use: No    Review of Systems  Constitutional:   No fever or chills.  ENT:   No sore throat. No rhinorrhea. Cardiovascular:   No chest pain positive syncope. Respiratory:   No dyspnea or cough. Gastrointestinal:   Negative for abdominal pain, vomiting and diarrhea.  Musculoskeletal:   Negative for focal pain or swelling All other systems reviewed and are negative except as documented above in ROS and HPI.  ____________________________________________   PHYSICAL EXAM:  VITAL SIGNS: ED Triage Vitals  Enc Vitals Group     BP 09/02/19 1154 (!) 177/53     Pulse Rate 09/02/19 1154 (!) 50     Resp 09/02/19 1154 16     Temp 09/02/19 1154 (!) 97.5 F (36.4 C)     Temp Source 09/02/19 1154 Oral      SpO2 09/02/19 1154 100 %     Weight 09/02/19 1155 126 lb (57.2 kg)     Height 09/02/19 1155 5\' 3"  (1.6 m)     Head Circumference --      Peak Flow --      Pain Score 09/02/19 1155 8     Pain Loc --      Pain Edu? --      Excl. in [place]? --     Vital signs reviewed, nursing assessments reviewed.   Constitutional:   Alert and oriented. Non-toxic appearance. Eyes:   Conjunctivae are  normal. EOMI. PERRL. ENT      Head:   Normocephalic and atraumatic.      Nose: Normal      Mouth/Throat:   Moist mucosa      Neck:   No meningismus. Full ROM. Hematological/Lymphatic/Immunilogical:   No cervical lymphadenopathy. Cardiovascular:   Regular rhythm, bradycardia, heart rate 50. Symmetric bilateral radial and DP pulses.  No murmurs. Cap refill less than 2 seconds. Respiratory:   Normal respiratory effort without tachypnea/retractions. Breath sounds are clear and equal bilaterally. No wheezes/rales/rhonchi. Gastrointestinal:   Soft and nontender. Non distended. There is no CVA tenderness.  No rebound, rigidity, or guarding.  Musculoskeletal:   Normal range of motion in all extremities. No joint effusions.  There is mild tenderness of the left lateral hip and tenderness over the left anterior pelvis over the pubic rami.  No edema. Neurologic:   Normal speech and language.  Motor grossly intact. No acute focal neurologic deficits are appreciated.  Skin:    Skin is warm, dry and intact. No rash noted.  No petechiae, purpura, or bullae.  ____________________________________________    LABS (pertinent positives/negatives) (all labs ordered are listed, but only abnormal results are displayed) Labs Reviewed  COMPREHENSIVE METABOLIC PANEL - Abnormal; Notable for the following components:      Result Value   Glucose, Bld 127 (*)    Total Bilirubin 1.4 (*)    All other components within normal limits  CBC WITH DIFFERENTIAL/PLATELET - Abnormal; Notable for the following components:   RBC 3.69 (*)     HCT 34.1 (*)    nRBC 0.6 (*)    All other components within normal limits  ETHANOL  URINALYSIS, COMPLETE (UACMP) WITH MICROSCOPIC  TSH  MAGNESIUM  TROPONIN I (HIGH SENSITIVITY)   ____________________________________________   EKG  Interpreted by me Sinus bradycardia, rate of 47, normal axis and intervals.  Poor R wave progression.  Normal ST segments and T waves.    ____________________________________________    RADIOLOGY  CT HEAD WO CONTRAST  Result Date: 09/02/2019 CLINICAL DATA:  Syncopal episode EXAM: CT HEAD WITHOUT CONTRAST TECHNIQUE: Contiguous axial images were obtained from the base of the skull through the vertex without intravenous contrast. COMPARISON:  None. FINDINGS: Brain: There is no acute intracranial hemorrhage, mass effect, or edema. Gray-white differentiation is preserved. There is no extra-axial fluid collection. Ventricles and sulci are within normal limits in size and configuration. Vascular: There is atherosclerotic calcification at the skull base. Skull: Calvarium is unremarkable. Sinuses/Orbits: No acute finding. Other: None. IMPRESSION: No acute intracranial abnormality. Electronically Signed   By: Macy Mis M.D.   On: 09/02/2019 12:47   DG Hip Unilat W or Wo Pelvis 2-3 Views Left  Result Date: 09/02/2019 CLINICAL DATA:  LEFT hip pain after fall EXAM: DG HIP (WITH OR WITHOUT PELVIS) 2-3V LEFT COMPARISON:  None. FINDINGS: Hips are located. No evidence of pelvic fracture or sacral fracture. Dedicated view of the LEFT hip demonstrates no femoral neck fracture. IMPRESSION: No fracture or dislocation Electronically Signed   By: Suzy Bouchard M.D.   On: 09/02/2019 13:05    ____________________________________________   PROCEDURES Procedures  ____________________________________________  DIFFERENTIAL DIAGNOSIS   Dehydration, electrolyte abnormality, non-STEMI, symptomatic bradycardia  CLINICAL IMPRESSION / ASSESSMENT AND PLAN / ED  COURSE  Medications ordered in the ED: Medications  sodium chloride 0.9 % bolus 500 mL (500 mLs Intravenous New Bag/Given 09/02/19 1308)    Pertinent labs & imaging results that were available during my care of the  patient were reviewed by me and considered in my medical decision making (see chart for details).  Jessalynn Mccowan was evaluated in Emergency Department on 09/02/2019 for the symptoms described in the history of present illness. She was evaluated in the context of the global COVID-19 pandemic, which necessitated consideration that the patient might be at risk for infection with the SARS-CoV-2 virus that causes COVID-19. Institutional protocols and algorithms that pertain to the evaluation of patients at risk for COVID-19 are in a state of rapid change based on information released by regulatory bodies including the CDC and federal and state organizations. These policies and algorithms were followed during the patient's care in the ED.   Patient presents after sudden syncope without warning or prodromal symptoms at home.  CT head and x-ray left pelvis unremarkable, no acute significant traumatic findings.  EKG nonischemic but shows sinus bradycardia.  Lab panel unremarkable, initial troponin normal.  We will plan to hospitalize for further cardiac work-up, concern for symptomatic bradycardia/dysrhythmia.      ____________________________________________   FINAL CLINICAL IMPRESSION(S) / ED DIAGNOSES    Final diagnoses:  Syncope, unspecified syncope type  Bradycardia  Chronic atrial fibrillation [facility])     ED Discharge Orders    None      Portions of this note were generated with dragon dictation software. Dictation errors may occur despite best attempts at proofreading.   Carrie Mew, MD 09/02/19 1329

## 2019-09-02 NOTE — ED Notes (Addendum)
Pt presents following syncopal episode at home, pt states she started "not feeling well" just prior to "passing out" pt was able to get up and call EMS. Pt denies hitting head. Pt c/o left hip pain. Pt has hx of Afib and HTN.

## 2019-09-02 NOTE — H&P (Signed)
History and Physical    [name] ZOX:[id number] DOB: 1940/11/12 DOA: 09/02/2019  Referring MD/NP/PA:   PCP: Venia Carbon, MD   Patient coming from:  The patient is coming from home.  At baseline, pt is independent for most of ADL.        Chief Complaint: Syncope  HPI: [name] is a 79 y.o. female with medical history significant of hypertension, hyperlipidemia, GERD, depression, uterine cancer, aortic valve insufficiency, atrial fibrillation on Eliquis, who presents with syncope.  Per patient's daughter, patient passed out at about 10:30 AM, and fell on the left hip, no head injury.  No significant left hip pain.  No head injury.  Patient denies unilateral weakness, numbness or tingling in extremities.  No facial droop or slurred speech.  Patient does not have chest pain, shortness breath, cough, fever or chills.  No nausea, vomiting, diarrhea, abdominal pain, symptoms of UTI.  Patient was found to have bradycardia with heart rate 40s-50s.  Patient is taking Coreg at home.  ED Course: pt was found to have WBC 5.2, troponin 8, pending COVID-19 PCR, alcohol level less than 10, electrolytes renal function okay, temperature normal, blood pressure 177/53, RR 16, oxygen saturation 100% on room air.  CT head is negative.  X-ray of left hip is negative for bony fracture.  Patient is placed on progressive bed of observation.  Review of Systems:   General: no fevers, chills, no body weight gain, has fatigue HEENT: no blurry vision, hearing changes or sore throat Respiratory: no dyspnea, coughing, wheezing CV: no chest pain, no palpitations GI: no nausea, vomiting, abdominal pain, diarrhea, constipation GU: no dysuria, burning on urination, increased urinary frequency, hematuria  Ext: no leg edema Neuro: no unilateral weakness, numbness, or tingling, no vision change or hearing loss. Has fall and syncope. Skin: no rash, no skin tear. MSK: No muscle spasm, no deformity, no  limitation of range of movement in spin Heme: No easy bruising.  Travel history: No recent long distant travel.  Allergy:  Allergies  Allergen Reactions  . Amlodipine Besy-Benazepril Hcl Swelling    Lotrel caused swelling of feet & facial swelling unilaterally  . Lotrel [Amlodipine Besy-Benazepril Hcl]   . Molds & Smuts Other (See Comments)  . Wasp Venom Swelling    Allergic to wasps    Past Medical History:  Diagnosis Date  . Adenomatous colon polyp 2005  . Allergy    perennial ; shots from Dr Velora Heckler  . Aortic insufficiency   . Hyperlipidemia   . Hypertension   . Murmur    a.  04/2011 Echo:  EF 55-60%, mild-mod ai/mr  . Uterine cancer [facility])     Past Surgical History:  Procedure Laterality Date  . BLADDER SUSPENSION    . CARPAL TUNNEL RELEASE Left   . COLONOSCOPY  multiple  . GLAUCOMA SURGERY     "for narrow lines" (? narrow angle?)  . INTRAOCULAR LENS INSERTION Bilateral 10/22/2014; 11/26/2014  . TOTAL ABDOMINAL HYSTERECTOMY W/ BILATERAL SALPINGOOPHORECTOMY     abnormal PAP    Social History:  reports that she has never smoked. She has never used smokeless tobacco. She reports current alcohol use. She reports that she does not use drugs.  Family History:  Family History  Problem Relation Age of Onset  . Stroke Mother 47  . Heart failure Mother   . Heart attack Father 41  . Lung cancer Father   . Cancer Maternal Aunt         X  2; Gyn & ? primary  . Diabetes Maternal Grandmother   . Esophageal cancer Neg Hx   . Colon cancer Neg Hx   . Stomach cancer Neg Hx   . Rectal cancer Neg Hx      Prior to Admission medications   Medication Sig Start Date End Date Taking? Authorizing Provider  apixaban (ELIQUIS) 5 MG TABS tablet Take 1 tablet (5 mg total) by mouth 2 (two) times daily. 08/14/18   Venia Carbon, MD  azelastine (ASTELIN) 0.1 % nasal spray Place 1 spray into both nostrils 2 (two) times daily. Use in each nostril as directed 06/21/17   Venia Carbon,  MD  Bismuth Subsalicylate (PEPTO-BISMOL) 262 MG TABS Take by mouth as needed.    [provider]  Calcium Carbonate Antacid (TUMS PO) Take by mouth as needed.    [provider]  Calcium Carbonate-Vitamin D (CALCIUM 600+D) 600-400 MG-UNIT per tablet Take 1 tablet by mouth 2 (two) times daily.    [provider]  carvedilol (COREG) 12.5 MG tablet TAKE 1& 1/2 TABLET BY MOUTH TWICE A DAY 10/22/18   Viviana Simpler I, MD  famotidine (PEPCID) 20 MG tablet Take 20 mg by mouth daily.    [provider]  fexofenadine (ALLEGRA) 180 MG tablet Take 180 mg by mouth daily.    [provider]  glucosamine-chondroitin 500-400 MG tablet 2 am 1 pm    [provider]  guaiFENesin (MUCINEX) 600 MG 12 hr tablet Take 600 mg by mouth 2 (two) times daily as needed.    [provider]  hydrochlorothiazide (MICROZIDE) 12.5 MG capsule TAKE 1 CAPSULE BY MOUTH EVERY DAY 10/22/18   Venia Carbon, MD  Multiple Vitamin (MULTIVITAMIN) capsule Take 1 capsule by mouth daily.    [provider]  Omega-3 Fatty Acids (FISH OIL PO) Take 1 tablet by mouth daily.    [provider]  pravastatin (PRAVACHOL) 40 MG tablet TAKE 1 TABLET BY MOUTH EVERYDAY AT BEDTIME 08/06/19   Viviana Simpler I, MD  pyridoxine (B-6) 100 MG tablet Take 100 mg by mouth daily.    [provider]  sertraline (ZOLOFT) 25 MG tablet TAKE 1 TABLET BY MOUTH EVERY DAY 10/15/18   Venia Carbon, MD  triamcinolone (NASACORT) 55 MCG/ACT nasal inhaler Place 2 sprays into the nose daily. 04/10/11 02/27/97  Hendricks Limes, MD  vitamin C (ASCORBIC ACID) 500 MG tablet Take 500 mg by mouth daily.    [provider]    Physical Exam: Vitals:   09/02/19 1154 09/02/19 1155 09/02/19 1617  BP: (!) 177/53  (!) 157/41  Pulse: (!) 50  (!) 55  Resp: 16  19  Temp: (!) 97.5 F (36.4 C)    TempSrc: Oral    SpO2: 100%  95%  Weight:  57.2 kg   Height:  5\' 3"  (1.6 m)    General:  Not in acute distress HEENT:       Eyes: PERRL, EOMI, no scleral icterus.       ENT: No discharge from the ears and nose, no pharynx injection, no tonsillar enlargement.        Neck: No JVD, no bruit, no mass felt. Heme: No neck lymph node enlargement. Cardiac: S1/S2, RRR, has murmurs, No gallops or rubs. Respiratory: No rales, wheezing, rhonchi or rubs. GI: Soft, nondistended, nontender, no rebound pain, no organomegaly, BS present. GU: No hematuria Ext: No pitting leg edema bilaterally. 2+DP/PT pulse bilaterally. Musculoskeletal: No joint deformities,  No joint redness or warmth, no limitation of ROM in spin. Skin: No rashes.  Neuro: Alert, oriented X3, cranial nerves II-XII grossly intact, moves all extremities normally. Psych: Patient is not psychotic, no suicidal or hemocidal ideation.  Labs on Admission: I have personally reviewed following labs and imaging studies  CBC: Recent Labs  Lab 09/02/19 1216  WBC 5.2  NEUTROABS 2.7  HGB 12.1  HCT 34.1*  MCV 92.4  PLT 062   Basic Metabolic Panel: Recent Labs  Lab 09/02/19 1216  NA 138  K 3.5  CL 100  CO2 26  GLUCOSE 127*  BUN 19  CREATININE 0.72  CALCIUM 9.1  MG 2.0   GFR: Estimated Creatinine Clearance: 46.4 mL/min (by C-G formula based on SCr of 0.72 mg/dL). Liver Function Tests: Recent Labs  Lab 09/02/19 1216  AST 28  ALT 21  ALKPHOS 40  BILITOT 1.4*  PROT 7.3  ALBUMIN 4.4   No results for input(s): LIPASE, AMYLASE in the last 168 hours. No results for input(s): AMMONIA in the last 168 hours. Coagulation Profile: No results for input(s): INR, PROTIME in the last 168 hours. Cardiac Enzymes: No results for input(s): CKTOTAL, CKMB, CKMBINDEX, TROPONINI in the last 168 hours. BNP (last 3 results) No results for input(s): PROBNP in the last 8760 hours. HbA1C: No results for input(s): HGBA1C in the last 72 hours. CBG: No results for input(s): GLUCAP in the last 168 hours. Lipid Profile: No results for  input(s): CHOL, HDL, LDLCALC, TRIG, CHOLHDL, LDLDIRECT in the last 72 hours. Thyroid Function Tests: Recent Labs    09/02/19 1216  TSH 4.983*   Anemia Panel: No results for input(s): VITAMINB12, FOLATE, FERRITIN, TIBC, IRON, RETICCTPCT in the last 72 hours. Urine analysis:    Component Value Date/Time   COLORURINE AMBER (A) 09/02/2019 1303   APPEARANCEUR HAZY (A) 09/02/2019 1303   LABSPEC 1.015 09/02/2019 1303   PHURINE 8.0 09/02/2019 1303   GLUCOSEU NEGATIVE 09/02/2019 1303   GLUCOSEU NEGATIVE 04/12/2009 0926   HGBUR NEGATIVE 09/02/2019 1303   BILIRUBINUR NEGATIVE 09/02/2019 1303   KETONESUR NEGATIVE 09/02/2019 1303   PROTEINUR 30 (A) 09/02/2019 1303   UROBILINOGEN 0.2 04/12/2009 0926   NITRITE NEGATIVE 09/02/2019 1303   LEUKOCYTESUR NEGATIVE 09/02/2019 1303   Sepsis Labs: @LABRCNTIP (procalcitonin:4,lacticidven:4) )No results found for this or any previous visit (from the past 240 hour(s)).   Radiological Exams on Admission: CT HEAD WO CONTRAST  Result Date: 09/02/2019 CLINICAL DATA:  Syncopal episode EXAM: CT HEAD WITHOUT CONTRAST TECHNIQUE: Contiguous axial images were obtained from the base of the skull through the vertex without intravenous contrast. COMPARISON:  None. FINDINGS: Brain: There is no acute intracranial hemorrhage, mass effect, or edema. Gray-white differentiation is preserved. There is no extra-axial fluid collection. Ventricles and sulci are within normal limits in size and configuration. Vascular: There is atherosclerotic calcification at the skull base. Skull: Calvarium is unremarkable. Sinuses/Orbits: No acute finding. Other: None. IMPRESSION: No acute intracranial abnormality. Electronically Signed   By: Macy Mis M.D.   On: 09/02/2019 12:47   DG Hip Unilat W or Wo Pelvis 2-3 Views Left  Result Date: 09/02/2019 CLINICAL DATA:  LEFT hip pain after fall EXAM: DG HIP (WITH OR WITHOUT PELVIS) 2-3V LEFT COMPARISON:  None. FINDINGS: Hips are located. No  evidence of pelvic fracture or sacral fracture. Dedicated view of the LEFT hip demonstrates no femoral neck fracture. IMPRESSION: No fracture or dislocation Electronically Signed   By: Suzy Bouchard M.D.   On: 09/02/2019 13:05  EKG: Independently reviewed.  Sinus rhythm, bradycardia, heart rate of 47, mild T wave inversion in inferior leads and precordial leads.  Assessment/Plan Principal Problem:   Syncope Active Problems:   HYPERLIPIDEMIA   Essential hypertension   GERD   Atrial fibrillation (HCC)   Depression   Fall at home, initial encounter   Syncope and fall at home, initial encounter: Etiology is not clear.  Patient was found to have bradycardia.  Patient is taking Coreg, will hold Coreg now.  Other differential diagnosis is broad, including vasovagal syncope, TIA, arrhythmia, orthostatic status, etc.   - Place on progressive for obs - Orthostatic vital signs  - MRI-brain - 2d echo - Neuro checks  - start ASA - IVF: 500cc NS, then 75 cc/h - PT/OT eval and treat - Hold coreg  HYPERLIPIDEMIA -Pravastatin  Essential hypertension -Hold Coreg as above -Hold HCTZ since patient need IV fluid -Start oral hydralazine 25 mg 3 times daily -IV hydralazine as needed  GERD -Pepcid  Atrial fibrillation (HCC) -Continue Eliquis -Hold Coreg since patient has bradycardia  Depression: No suicidal homicidal ideations -Continue home medications           DVT ppx:  SQ Lovenox Code Status: Full code Family Communication:    Yes, patient's daughter   at bed side Disposition Plan:  Anticipate discharge back to previous environment Consults called: None Admission status:  progressive unit for obs   Status is: Observation  The patient remains OBS appropriate and will d/c before 2 midnights.  Dispo: The patient is from: Home              Anticipated d/c is to: Home              Anticipated d/c date is: 1 day              Patient currently is not medically  stable to d/c.          Date of Service 09/02/2019    Ivor Costa [facility]   If 7PM-7AM, please contact night-coverage [email or web address] 09/02/2019, 6:17 PM

## 2019-09-02 NOTE — ED Triage Notes (Signed)
Pt arrive from home via [facility]. Pt had syncopal episode at home. Pt fell on left hip. Pt denies hitting head. Pt was bradycardic for EMS w/ HR 48-52, BG 154 all other VS stable.

## 2019-09-03 ENCOUNTER — Inpatient Hospital Stay: Payer: Medicare Other

## 2019-09-03 ENCOUNTER — Encounter: Payer: Self-pay | Admitting: Internal Medicine

## 2019-09-03 ENCOUNTER — Observation Stay
Admit: 2019-09-03 | Discharge: 2019-09-03 | Disposition: A | Discharge status: Home / Self Care | Payer: Medicare Other | Attending: Internal Medicine | Admitting: Internal Medicine

## 2019-09-03 ENCOUNTER — Observation Stay: Admit: 2019-09-03 | Payer: Medicare Other

## 2019-09-03 DIAGNOSIS — Z20822 Contact with and (suspected) exposure to covid-19: Secondary | ICD-10-CM | POA: Diagnosis present

## 2019-09-03 DIAGNOSIS — R001 Bradycardia, unspecified: Secondary | ICD-10-CM | POA: Diagnosis present

## 2019-09-03 DIAGNOSIS — Z8542 Personal history of malignant neoplasm of other parts of uterus: Secondary | ICD-10-CM | POA: Diagnosis not present

## 2019-09-03 DIAGNOSIS — Z7901 Long term (current) use of anticoagulants: Secondary | ICD-10-CM | POA: Diagnosis not present

## 2019-09-03 DIAGNOSIS — Z823 Family history of stroke: Secondary | ICD-10-CM | POA: Diagnosis not present

## 2019-09-03 DIAGNOSIS — R55 Syncope and collapse: Secondary | ICD-10-CM | POA: Diagnosis present

## 2019-09-03 DIAGNOSIS — Z8249 Family history of ischemic heart disease and other diseases of the circulatory system: Secondary | ICD-10-CM | POA: Diagnosis not present

## 2019-09-03 DIAGNOSIS — I10 Essential (primary) hypertension: Secondary | ICD-10-CM | POA: Diagnosis present

## 2019-09-03 DIAGNOSIS — F329 Major depressive disorder, single episode, unspecified: Secondary | ICD-10-CM | POA: Diagnosis present

## 2019-09-03 DIAGNOSIS — I482 Chronic atrial fibrillation, unspecified: Secondary | ICD-10-CM | POA: Diagnosis present

## 2019-09-03 DIAGNOSIS — I6523 Occlusion and stenosis of bilateral carotid arteries: Secondary | ICD-10-CM | POA: Diagnosis not present

## 2019-09-03 DIAGNOSIS — E785 Hyperlipidemia, unspecified: Secondary | ICD-10-CM | POA: Diagnosis present

## 2019-09-03 DIAGNOSIS — Y92009 Unspecified place in unspecified non-institutional (private) residence as the place of occurrence of the external cause: Secondary | ICD-10-CM | POA: Diagnosis not present

## 2019-09-03 DIAGNOSIS — I4891 Unspecified atrial fibrillation: Secondary | ICD-10-CM | POA: Diagnosis not present

## 2019-09-03 DIAGNOSIS — W19XXXA Unspecified fall, initial encounter: Secondary | ICD-10-CM | POA: Diagnosis not present

## 2019-09-03 DIAGNOSIS — Z86010 Personal history of colonic polyps: Secondary | ICD-10-CM | POA: Diagnosis not present

## 2019-09-03 DIAGNOSIS — Z91030 Bee allergy status: Secondary | ICD-10-CM | POA: Diagnosis not present

## 2019-09-03 DIAGNOSIS — Z888 Allergy status to other drugs, medicaments and biological substances status: Secondary | ICD-10-CM | POA: Diagnosis not present

## 2019-09-03 DIAGNOSIS — Z79899 Other long term (current) drug therapy: Secondary | ICD-10-CM | POA: Diagnosis not present

## 2019-09-03 DIAGNOSIS — Z833 Family history of diabetes mellitus: Secondary | ICD-10-CM | POA: Diagnosis not present

## 2019-09-03 DIAGNOSIS — K219 Gastro-esophageal reflux disease without esophagitis: Secondary | ICD-10-CM | POA: Diagnosis present

## 2019-09-03 LAB — BASIC METABOLIC PANEL
Anion gap: 6 (ref 5–15)
BUN: 19 mg/dL (ref 8–23)
CO2: 27 mmol/L (ref 22–32)
Calcium: 8.3 mg/dL — ABNORMAL LOW (ref 8.9–10.3)
Chloride: 106 mmol/L (ref 98–111)
Creatinine, Ser: 0.65 mg/dL (ref 0.44–1.00)
GFR calc Af Amer: >60 mL/min (ref >60)
GFR calc non Af Amer: >60 mL/min (ref >60)
Glucose, Bld: 107 mg/dL — ABNORMAL HIGH (ref 70–99)
Potassium: 3.5 mmol/L (ref 3.5–5.1)
Sodium: 139 mmol/L (ref 135–145)

## 2019-09-03 LAB — ECHOCARDIOGRAM COMPLETE
Height: 63 in
Weight: 2016 oz

## 2019-09-03 LAB — CBC
HCT: 27.8 % — ABNORMAL LOW (ref 36.0–46.0)
Hemoglobin: 9.8 g/dL — ABNORMAL LOW (ref 12.0–15.0)
MCH: 32.8 pg (ref 26.0–34.0)
MCHC: 35.3 g/dL (ref 30.0–36.0)
MCV: 93 fL (ref 80.0–100.0)
Platelets: 180 10*3/uL (ref 150–400)
RBC: 2.99 MIL/uL — ABNORMAL LOW (ref 3.87–5.11)
RDW: 14.6 % (ref 11.5–15.5)
WBC: 6 10*3/uL (ref 4.0–10.5)
nRBC: 0.3 % — ABNORMAL HIGH (ref 0.0–0.2)

## 2019-09-03 LAB — SARS CORONAVIRUS 2 BY RT PCR (HOSPITAL ORDER, PERFORMED IN ~~LOC~~ HOSPITAL LAB): SARS Coronavirus 2: NEGATIVE

## 2019-09-03 NOTE — Consult Note (Signed)
Cardiology Consultation Note    Patient ID: [name], MRN: [id number], DOB/AGE: 80-Apr-1942 80 y.o. Admit date: 09/02/2019   Date of Consult: 09/03/2019 Primary Physician: Venia Carbon, MD Primary Cardiologist: Dr. Nehemiah Massed  Chief Complaint: syncope Reason for Consultation: syncope Requesting MD: Dr. Roger Shelter  HPI: [name] is a 79 y.o. female with history of hypertension, hyperlipidemia anemia, depression, aortic valve insufficiency, chronic atrial fibrillation anticoagulated with Eliquis and rate controlled with carvedilol who was admitted after presenting to emergency room with a syncopal episode.  Occurred at 10:30 in the morning on day of admission.  Patient fell on her hip but no head injury.  Her heart rate was 40-50 on admission.  Is of note the patient takes carvedilol as an outpatient.  Acute CVA, MI was ruled out.  She is currently what appears to be sinus bradycardia at a rate of 59.  Patient is on 12.5 mg twice daily of carvedilol as an outpatient.  Since this is been discontinued after admission her heart rate is gradually improved.  She is asymptomatic at present.  Past Medical History:  Diagnosis Date  . Adenomatous colon polyp 2005  . Allergy    perennial ; shots from Dr Velora Heckler  . Aortic insufficiency   . Hyperlipidemia   . Hypertension   . Murmur    a.  04/2011 Echo:  EF 55-60%, mild-mod ai/mr  . Uterine cancer [facility])       Surgical History:  Past Surgical History:  Procedure Laterality Date  . BLADDER SUSPENSION    . CARPAL TUNNEL RELEASE Left   . COLONOSCOPY  multiple  . GLAUCOMA SURGERY     "for narrow lines" (? narrow angle?)  . INTRAOCULAR LENS INSERTION Bilateral 10/22/2014; 11/26/2014  . TOTAL ABDOMINAL HYSTERECTOMY W/ BILATERAL SALPINGOOPHORECTOMY     abnormal PAP     Home Meds: Prior to Admission medications   Medication Sig Start Date End Date Taking? Authorizing Provider  apixaban (ELIQUIS) 2.5 MG TABS tablet Take 2.5 mg by  mouth 2 (two) times daily.   Yes [provider]  azelastine (ASTELIN) 0.1 % nasal spray Place 1 spray into both nostrils 2 (two) times daily. Use in each nostril as directed 06/21/17  Yes Venia Carbon, MD  carvedilol (COREG) 12.5 MG tablet TAKE 1& 1/2 TABLET BY MOUTH TWICE A DAY Patient taking differently: Take 18.75 mg by mouth 2 (two) times daily with a meal. TAKE 1& 1/2 TABLET BY MOUTH TWICE A DAY 10/22/18  Yes Venia Carbon, MD  famotidine (PEPCID) 20 MG tablet Take 20 mg by mouth daily.   Yes [provider]  fexofenadine (ALLEGRA) 180 MG tablet Take 180 mg by mouth daily.   Yes [provider]  glucosamine-chondroitin 500-400 MG tablet Take 1 tablet by mouth 2 (two) times daily. 2 am 1 pm    Yes [provider]  hydrochlorothiazide (MICROZIDE) 12.5 MG capsule TAKE 1 CAPSULE BY MOUTH EVERY DAY Patient taking differently: Take 12.5 mg by mouth daily.  10/22/18  Yes Venia Carbon, MD  Multiple Vitamin (MULTIVITAMIN) capsule Take 1 capsule by mouth daily.   Yes [provider]  Omega-3 Fatty Acids (FISH OIL PO) Take 1 tablet by mouth daily.   Yes [provider]  pravastatin (PRAVACHOL) 40 MG tablet TAKE 1 TABLET BY MOUTH EVERYDAY AT BEDTIME Patient taking differently: Take 40 mg by mouth at bedtime.  08/06/19  Yes Venia Carbon, MD  pyridoxine (B-6) 100 MG tablet Take  100 mg by mouth daily.   Yes [provider]  sertraline (ZOLOFT) 25 MG tablet TAKE 1 TABLET BY MOUTH EVERY DAY Patient taking differently: Take 25 mg by mouth daily.  10/15/18  Yes Venia Carbon, MD  vitamin C (ASCORBIC ACID) 500 MG tablet Take 500 mg by mouth daily.   Yes [provider]  Bismuth Subsalicylate (PEPTO-BISMOL) 262 MG TABS Take by mouth as needed.    [provider]  Calcium Carbonate Antacid (TUMS PO) Take by mouth as needed.    [provider]  Calcium Carbonate-Vitamin D (CALCIUM 600+D) 600-400 MG-UNIT per  tablet Take 1 tablet by mouth 2 (two) times daily. Patient not taking: Reported on 09/02/2019    [provider]  guaiFENesin (MUCINEX) 600 MG 12 hr tablet Take 600 mg by mouth 2 (two) times daily as needed.    [provider]    Inpatient Medications:  . apixaban  2.5 mg Oral BID  . vitamin C  500 mg Oral Daily  . azelastine  1 spray Each Nare BID  . calcium-vitamin D  1 tablet Oral BID  . famotidine  20 mg Oral Daily  . hydrALAZINE  25 mg Oral Q8H  . loratadine  10 mg Oral Daily  . multivitamin with minerals  1 tablet Oral Daily  . omega-3 acid ethyl esters  1 g Oral Daily  . pravastatin  40 mg Oral QHS  . pyridoxine  100 mg Oral Daily  . sertraline  25 mg Oral Daily  . sodium chloride flush  3 mL Intravenous Q12H   . sodium chloride Stopped (09/03/19 0859)    Allergies:  Allergies  Allergen Reactions  . Amlodipine Besy-Benazepril Hcl Swelling    Lotrel caused swelling of feet & facial swelling unilaterally  . Lotrel [Amlodipine Besy-Benazepril Hcl]   . Molds & Smuts Other (See Comments)  . Wasp Venom Swelling    Allergic to wasps    Social History   Socioeconomic History  . Marital status: Widowed    Spouse name: Not on file  . Number of children: 2  . Years of education: Not on file  . Highest education level: Not on file  Occupational History  . 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 Smoking status: Never Smoker  . Smokeless tobacco: Never Used  Vaping Use  . Vaping Use: Never used  Substance and Sexual Activity  . Alcohol use: Yes    Comment: special events  . Drug use: No  . Sexual activity: Never  Other Topics Concern  . Not on file  Social History Narrative   Widowed 12/19      Has living will   Children now would be health care POA   Would accept resuscitation but no prolonged ventilation   No tube feeds if cognitively unaware      2 daughters, 4 grandsons 1 great-grandson   Retired Medical sales representative  from [place] Strain:   . Difficulty of Paying Living Expenses:   Food Insecurity:   . Worried About Charity fundraiser in the Last Year:   . Arboriculturist in the Last Year:   Transportation Needs:   . Film/video editor (Medical):   Marland Kitchen Lack of Transportation (Non-Medical):   Physical Activity:   . Days of Exercise per Week:   . Minutes of Exercise per Session:   Stress:   .  Feeling of Stress :   Social Connections:   . Frequency of Communication with Friends and Family:   . Frequency of Social Gatherings with Friends and Family:   . Attends Religious Services:   . Active Member of Clubs or Organizations:   . Attends Archivist Meetings:   Marland Kitchen Marital Status:   Intimate Partner Violence:   . Fear of Current or Ex-Partner:   . Emotionally Abused:   Marland Kitchen Physically Abused:   . Sexually Abused:      Family History  Problem Relation Age of Onset  . Stroke Mother 54  . Heart failure Mother   . Heart attack Father 70  . Lung cancer Father   . Cancer Maternal Aunt         X 2; Gyn & ? primary  . Diabetes Maternal Grandmother   . Esophageal cancer Neg Hx   . Colon cancer Neg Hx   . Stomach cancer Neg Hx   . Rectal cancer Neg Hx      Review of Systems: A 12-system review of systems was performed and is negative except as noted in the HPI.  Labs: No results for input(s): CKTOTAL, CKMB, TROPONINI in the last 72 hours. Lab Results  Component Value Date   WBC 6.0 09/03/2019   HGB 9.8 (L) 09/03/2019   HCT 27.8 (L) 09/03/2019   MCV 93.0 09/03/2019   PLT 180 09/03/2019    Recent Labs  Lab 09/02/19 1216 09/02/19 1216 09/03/19 0332  NA 138   < > 139  K 3.5   < > 3.5  CL 100   < > 106  CO2 26   < > 27  BUN 19   < > 19  CREATININE 0.72   < > 0.65  CALCIUM 9.1   < > 8.3*  PROT 7.3  --   --   BILITOT 1.4*  --   --   ALKPHOS 40  --   --   ALT 21  --   --   AST 28  --   --   GLUCOSE 127*   < > 107*    < > = values in this interval not displayed.   Lab Results  Component Value Date   CHOL 135 08/14/2018   HDL 49.30 08/14/2018   LDLCALC 71 08/14/2018   TRIG 70.0 08/14/2018   No results found for: DDIMER  Radiology/Studies:  CT HEAD WO CONTRAST  Result Date: 09/02/2019 CLINICAL DATA:  Syncopal episode EXAM: CT HEAD WITHOUT CONTRAST TECHNIQUE: Contiguous axial images were obtained from the base of the skull through the vertex without intravenous contrast. COMPARISON:  None. FINDINGS: Brain: There is no acute intracranial hemorrhage, mass effect, or edema. Gray-white differentiation is preserved. There is no extra-axial fluid collection. Ventricles and sulci are within normal limits in size and configuration. Vascular: There is atherosclerotic calcification at the skull base. Skull: Calvarium is unremarkable. Sinuses/Orbits: No acute finding. Other: None. IMPRESSION: No acute intracranial abnormality. Electronically Signed   By: Macy Mis M.D.   On: 09/02/2019 12:47   MR BRAIN WO CONTRAST  Result Date: 09/02/2019 CLINICAL DATA:  Syncope EXAM: MRI HEAD WITHOUT CONTRAST TECHNIQUE: Multiplanar, multiecho pulse sequences of the brain and surrounding structures were obtained without intravenous contrast. COMPARISON:  None. FINDINGS: Brain: No acute infarct, acute hemorrhage or extra-axial collection. Multifocal white matter hyperintensity, most commonly due to chronic ischemic microangiopathy. Normal volume of CSF spaces. No chronic microhemorrhage. Normal midline structures. Vascular: Normal flow voids.  Skull and upper cervical spine: Normal marrow signal. Sinuses/Orbits: Negative. Other: None. IMPRESSION: 1. No acute intracranial abnormality. 2. Multifocal white matter hyperintensity, most commonly due to chronic ischemic microangiopathy. Electronically Signed   By: Ulyses Jarred M.D.   On: 09/02/2019 21:54   DG Hip Unilat W or Wo Pelvis 2-3 Views Left  Result Date: 09/02/2019 CLINICAL DATA:   LEFT hip pain after fall EXAM: DG HIP (WITH OR WITHOUT PELVIS) 2-3V LEFT COMPARISON:  None. FINDINGS: Hips are located. No evidence of pelvic fracture or sacral fracture. Dedicated view of the LEFT hip demonstrates no femoral neck fracture. IMPRESSION: No fracture or dislocation Electronically Signed   By: Suzy Bouchard M.D.   On: 09/02/2019 13:05    Wt Readings from Last 3 Encounters:  09/02/19 57.2 kg  02/14/19 56.7 kg  08/14/18 56.2 kg    EKG: EKG on admission revealed sinus bradycardia at 47.  Physical Exam:  Blood pressure (!) 151/53, pulse (!) 47, temperature 98 F (36.7 C), temperature source Oral, resp. rate 12, height 5\' 3"  (1.6 m), weight 57.2 kg, SpO2 99 %. Body mass index is 22.32 kg/m. General: Well developed, well nourished, in no acute distress. Head: Normocephalic, atraumatic, sclera non-icteric, no xanthomas, nares are without discharge.  Neck: Negative for carotid bruits. JVD not elevated. Lungs: Clear bilaterally to auscultation without wheezes, rales, or rhonchi. Breathing is unlabored. Heart: RRR with S1 S2. No murmurs, rubs, or gallops appreciated. Abdomen: Soft, non-tender, non-distended with normoactive bowel sounds. No hepatomegaly. No rebound/guarding. No obvious abdominal masses. Msk:  Strength and tone appear normal for age. Extremities: No clubbing or cyanosis. No edema.  Distal pedal pulses are 2+ and equal bilaterally. Neuro: Alert and oriented X 3. No facial asymmetry. No focal deficit. Moves all extremities spontaneously. Psych:  Responds to questions appropriately with a normal affect.     Assessment and Plan  Etiology of syncope is likely due to bradycardia which appears to be secondary to carvedilol.  Her heart rate has improved since holding the carvedilol.  There have been no tachyarrhythmias.  We will continue with this for now.  Will order carotid Dopplers to ascertain whether or not there is any significant carotid disease.  Has ruled out for  myocardial infarction.  Electrolytes appear unremarkable.  No high-grade valvular abnormalities noted.  Will remain off of carvedilol for now would watch another 24 hours.  After discharge with follow-up in our office within 1 week for a Holter monitor off of beta-blockers.  Would agree with Eliquis at 2.5 mg twice daily given her size and age.  Alternative etiologies such as vasovagal as well as orthostatic changes are possible with the clinical scenario supports bradycardia.  Given improved heart rate off of carvedilol does not appear to require a pacemaker at this point.  Signed, Teodoro Spray MD 09/03/2019, 10:45 AM Pager: [phone]

## 2019-09-03 NOTE — Progress Notes (Signed)
PROGRESS NOTE    Patient: [name]                            PCP: Venia Carbon, MD                    DOB: [birth year]            DOA: 09/02/2019 QMV:[id number]             DOS: 09/03/2019, 12:10 PM   LOS: 0 days   Date of Service: The patient was seen and examined on 09/03/2019  Subjective:   The patient was seen and examined this Am. Stable  Otherwise no issues overnight .  Brief Narrative:  Per HPI:  [name] is a 79 y.o. female with medical history significant of hypertension, hyperlipidemia, GERD, depression, uterine cancer, aortic valve insufficiency, atrial fibrillation on Eliquis, who presents with syncope.  Per patient's daughter, patient passed out at about 10:30 AM on 09/02/2019, and fell on the left hip, no head injury.  No significant left hip pain.  No head injury.  Patient denies unilateral weakness, numbness or tingling in extremities.  No facial droop or slurred speech.  No incontinency of bowel or bladder.   Heart rate with 40-50 on admission  Assessment & Plan:   Principal Problem:   Syncope Active Problems:   HYPERLIPIDEMIA   Essential hypertension   GERD   Atrial fibrillation (HCC)   Depression   Fall at home, initial encounter   Syncope and fall at home, initial encounter:  -Unknown etiology -Acute CVA, MI, infection ruled out -Noted for bradycardia -on admission heart rate 40s-50s, currently 58 -Continue monitoring heart rate on a monitored bed,   Other differential diagnosis is broad, including vasovagal syncope, TIA, arrhythmia, orthostatic status, etc.   - Orthostatic vital signs  - MRI-brain :  - 2d echo:  - Neuro checks  - start ASA - IVF: 500cc NS, then 75 cc/h - PT/OT eval and treat - Hold coreg  Atrial fibrillation (HCC)/ Bradycardia -Monitoring heart rate on the monitor bed -Continue Eliquis -Hold Coreg since patient has bradycardia   HYPERLIPIDEMIA -Pravastatin  Essential hypertension -Hold Coreg as  above -Hold HCTZ since patient need IV fluid -Start oral hydralazine 25 mg 3 times daily -IV hydralazine as needed  GERD -Asymptomatic stable -Pepcid   Depression:  Stable, no homicidal suicidal ideation -Continue home medications      DVT ppx:  SQ Lovenox Code Status: Full code Family Communication:    Yes, patient's daughter   at bed side Disposition Plan:  Anticipate discharge back to previous environment Consults called: None Admission status:  progressive unit for obs   Status is: Observation  The patient remains OBS appropriate and will d/c before 2 midnights.  Dispo: The patient is from: Home  Anticipated d/c is to: Home  Anticipated d/c date is: 1 day  Patient currently is not medically stable to d/c.       Nutritional status:           Procedures:   No admission procedures for hospital encounter.     Antimicrobials:  Anti-infectives (From admission, onward)   None       Medication:  . apixaban  2.5 mg Oral BID  . vitamin C  500 mg Oral Daily  . azelastine  1 spray Each Nare BID  . calcium-vitamin D  1 tablet Oral BID  . famotidine  20 mg Oral Daily  . hydrALAZINE  25 mg Oral Q8H  . loratadine  10 mg Oral Daily  . multivitamin with minerals  1 tablet Oral Daily  . omega-3 acid ethyl esters  1 g Oral Daily  . pravastatin  40 mg Oral QHS  . pyridoxine  100 mg Oral Daily  . sertraline  25 mg Oral Daily  . sodium chloride flush  3 mL Intravenous Q12H    acetaminophen, guaiFENesin, hydrALAZINE, ondansetron (ZOFRAN) IV   Objective:   Vitals:   09/03/19 0715 09/03/19 1001 09/03/19 1026 09/03/19 1137  BP: (!) 118/42 (!) 170/73 (!) 151/53 (!) 156/53  Pulse:  68 (!) 47 (!) 54  Resp: 14 15 12 17   Temp:    97.8 F (36.6 C)  TempSrc:      SpO2:  98% 99% 99%  Weight:    57.7 kg  Height:    5\' 3"  (1.6 m)    Intake/Output Summary (Last 24 hours) at 09/03/2019 1210 Last data filed at  09/02/2019 1415 Gross per 24 hour  Intake 500 ml  Output --  Net 500 ml   Filed Weights   09/02/19 1155 09/03/19 1137  Weight: 57.2 kg 57.7 kg     Examination:   Physical Exam  Constitution:  Alert, cooperative, no distress,  Appears calm and comfortable  Psychiatric: Normal and stable mood and affect, cognition intact,   HEENT: Normocephalic, PERRL, otherwise with in Normal limits  Chest:Chest symmetric Cardio vascular:  S1/S2, RRR, No murmure, No Rubs or Gallops  pulmonary: Clear to auscultation bilaterally, respirations unlabored, negative wheezes / crackles Abdomen: Soft, non-tender, non-distended, bowel sounds,no masses, no organomegaly Muscular skeletal: Limited exam - in bed, able to move all 4 extremities, Normal strength,  Neuro: CNII-XII intact. , normal motor and sensation, reflexes intact  Extremities: No pitting edema lower extremities, +2 pulses  Skin: Dry, warm to touch, negative for any Rashes, No open wounds Wounds: per nursing documentation    ------------------------------------------------------------------------------------------------------------------------------------------    LABs:  CBC Latest Ref Rng & Units 09/03/2019 09/02/2019 08/14/2018  WBC 4.0 - 10.5 K/uL 6.0 5.2 5.1  Hemoglobin 12.0 - 15.0 g/dL 9.8(L) 12.1 12.0  Hematocrit 36 - 46 % 27.8(L) 34.1(L) 35.5(L)  Platelets 150 - 400 K/uL 180 205 164.0   CMP Latest Ref Rng & Units 09/03/2019 09/02/2019 08/14/2018  Glucose 70 - 99 mg/dL 107(H) 127(H) 108(H)  BUN 8 - 23 mg/dL 19 19 15   Creatinine 0.44 - 1.00 mg/dL 0.65 0.72 0.60  Sodium 135 - 145 mmol/L 139 138 140  Potassium 3.5 - 5.1 mmol/L 3.5 3.5 3.8  Chloride 98 - 111 mmol/L 106 100 100  CO2 22 - 32 mmol/L 27 26 32  Calcium 8.9 - 10.3 mg/dL 8.3(L) 9.1 9.6  Total Protein 6.5 - 8.1 g/dL - 7.3 7.1  Total Bilirubin 0.3 - 1.2 mg/dL - 1.4(H) 1.7(H)  Alkaline Phos 38 - 126 U/L - 40 55  AST 15 - 41 U/L - 28 25  ALT 0 - 44 U/L - 21 27       Micro  Results Recent Results (from the past 240 hour(s))  SARS Coronavirus 2 by RT PCR (hospital order, performed in [facility] hospital lab) Nasopharyngeal Nasopharyngeal Swab     Status: None   Collection Time: 09/02/19  1:45 PM   Specimen: Nasopharyngeal Swab  Result Value Ref Range Status   SARS Coronavirus 2 NEGATIVE NEGATIVE Final    Comment: (NOTE) SARS-CoV-2 target nucleic acids are  NOT DETECTED.  The SARS-CoV-2 RNA is generally detectable in upper and lower respiratory specimens during the acute phase of infection. The lowest concentration of SARS-CoV-2 viral copies this assay can detect is 250 copies / mL. A negative result does not preclude SARS-CoV-2 infection and should not be used as the sole basis for treatment or other patient management decisions.  A negative result may occur with improper specimen collection / handling, submission of specimen other than nasopharyngeal swab, presence of viral mutation(s) within the areas targeted by this assay, and inadequate number of viral copies (<250 copies / mL). A negative result must be combined with clinical observations, patient history, and epidemiological information.  Fact Sheet for Patients:   StrictlyIdeas.no  Fact Sheet for Healthcare Providers: BankingDealers.co.za  This test is not yet approved or  cleared by the [place] FDA and has been authorized for detection and/or diagnosis of SARS-CoV-2 by FDA under an Emergency Use Authorization (EUA).  This EUA will remain in effect (meaning this test can be used) for the duration of the COVID-19 declaration under Section 564(b)(1) of the Act, 21 U.S.C. section 360bbb-3(b)(1), unless the authorization is terminated or revoked sooner.  Performed at [facility], [address] [place], [place] [address]     Radiology Reports CT HEAD WO CONTRAST  Result Date: 09/02/2019 CLINICAL DATA:  Syncopal episode EXAM: CT  HEAD WITHOUT CONTRAST TECHNIQUE: Contiguous axial images were obtained from the base of the skull through the vertex without intravenous contrast. COMPARISON:  None. FINDINGS: Brain: There is no acute intracranial hemorrhage, mass effect, or edema. Gray-white differentiation is preserved. There is no extra-axial fluid collection. Ventricles and sulci are within normal limits in size and configuration. Vascular: There is atherosclerotic calcification at the skull base. Skull: Calvarium is unremarkable. Sinuses/Orbits: No acute finding. Other: None. IMPRESSION: No acute intracranial abnormality. Electronically Signed   By: Macy Mis M.D.   On: 09/02/2019 12:47   MR BRAIN WO CONTRAST  Result Date: 09/02/2019 CLINICAL DATA:  Syncope EXAM: MRI HEAD WITHOUT CONTRAST TECHNIQUE: Multiplanar, multiecho pulse sequences of the brain and surrounding structures were obtained without intravenous contrast. COMPARISON:  None. FINDINGS: Brain: No acute infarct, acute hemorrhage or extra-axial collection. Multifocal white matter hyperintensity, most commonly due to chronic ischemic microangiopathy. Normal volume of CSF spaces. No chronic microhemorrhage. Normal midline structures. Vascular: Normal flow voids. Skull and upper cervical spine: Normal marrow signal. Sinuses/Orbits: Negative. Other: None. IMPRESSION: 1. No acute intracranial abnormality. 2. Multifocal white matter hyperintensity, most commonly due to chronic ischemic microangiopathy. Electronically Signed   By: Ulyses Jarred M.D.   On: 09/02/2019 21:54   ECHOCARDIOGRAM COMPLETE  Result Date: 09/03/2019    ECHOCARDIOGRAM REPORT   Patient Name:   KHAYLEE MCEVOY Date of Exam: 09/03/2019 Medical Rec #:  [id number]        Height:       63.0 in Accession #:    [id number]       Weight:       126.0 lb Date of Birth:  1940/10/04        BSA:          1.589 m Patient Age:    33 years         BP:           118/42 mmHg Patient Gender: F                HR:           52 bpm.  Exam Location:  [facility] Procedure: 2D Echo, Cardiac Doppler and Color Doppler Indications:     Syncope 780.2  History:         Patient has no prior history of Echocardiogram examinations.                  Signs/Symptoms:Murmur; Risk Factors:Hypertension. Aortic                  insuffiency.  Sonographer:     Sherrie Sport RDCS (AE) Referring Phys:  [id number] Soledad Gerlach NIU Diagnosing Phys: Bartholome Bill MD IMPRESSIONS  1. Left ventricular ejection fraction, by estimation, is 55 to 60%. The left ventricle has normal function. The left ventricle has no regional wall motion abnormalities. Left ventricular diastolic parameters were normal.  2. Right ventricular systolic function is normal. The right ventricular size is normal. There is severely elevated pulmonary artery systolic pressure.  3. Left atrial size was mildly dilated.  4. The mitral valve is grossly normal. Mild to moderate mitral valve regurgitation.  5. The aortic valve is tricuspid. Aortic valve regurgitation is trivial. FINDINGS  Left Ventricle: Left ventricular ejection fraction, by estimation, is 55 to 60%. The left ventricle has normal function. The left ventricle has no regional wall motion abnormalities. The left ventricular internal cavity size was normal in size. There is  no left ventricular hypertrophy. Left ventricular diastolic parameters were normal. Right Ventricle: The right ventricular size is normal. No increase in right ventricular wall thickness. Right ventricular systolic function is normal. There is severely elevated pulmonary artery systolic pressure. The tricuspid regurgitant velocity is 3.89 m/s, and with an assumed right atrial pressure of 10 mmHg, the estimated right ventricular systolic pressure is 42.6 mmHg. Left Atrium: Left atrial size was mildly dilated. Right Atrium: Right atrial size was normal in size. Pericardium: There is no evidence of pericardial effusion. Mitral Valve: The mitral valve is grossly normal. Mild to moderate mitral valve  regurgitation. Tricuspid Valve: The tricuspid valve is grossly normal. Tricuspid valve regurgitation is mild. Aortic Valve: The aortic valve is tricuspid. Aortic valve regurgitation is trivial. Aortic regurgitation PHT measures 771 msec. Aortic valve mean gradient measures 9.0 mmHg. Aortic valve peak gradient measures 14.9 mmHg. Aortic valve area, by VTI measures 1.70 cm. Pulmonic Valve: The pulmonic valve was not well visualized. Pulmonic valve regurgitation is trivial. Aorta: The aortic root is normal in size and structure. IAS/Shunts: The interatrial septum was not assessed.  LEFT VENTRICLE PLAX 2D LVIDd:         5.12 cm  Diastology LVIDs:         3.28 cm  LV e' lateral:   8.59 cm/s LV PW:         0.88 cm  LV E/e' lateral: 13.4 LV IVS:        0.92 cm  LV e' medial:    6.53 cm/s LVOT diam:     2.00 cm  LV E/e' medial:  17.6 LV SV:         88 LV SV Index:   56 LVOT Area:     3.14 cm  RIGHT VENTRICLE RV Basal diam:  2.98 cm RV S prime:     10.10 cm/s TAPSE (M-mode): 3.8 cm LEFT ATRIUM             Index       RIGHT ATRIUM           Index LA diam:        4.40 cm 2.77 cm/m  RA Area:     17.90 cm LA Vol (A2C):   86.7 ml 54.57 ml/m RA Volume:   44.90 ml  28.26 ml/m LA Vol (A4C):   82.0 ml 51.61 ml/m LA Biplane Vol: 84.1 ml 52.93 ml/m  AORTIC VALVE                    PULMONIC VALVE AV Area (Vmax):    1.41 cm     PV Vmax:        0.73 m/s AV Area (Vmean):   1.44 cm     PV Peak grad:   2.1 mmHg AV Area (VTI):     1.70 cm     RVOT Peak grad: 3 mmHg AV Vmax:           193.00 cm/s AV Vmean:          140.000 cm/s AV VTI:            0.520 m AV Peak Grad:      14.9 mmHg AV Mean Grad:      9.0 mmHg LVOT Vmax:         86.80 cm/s LVOT Vmean:        64.100 cm/s LVOT VTI:          0.281 m LVOT/AV VTI ratio: 0.54 AI PHT:            771 msec  AORTA Ao Root diam: 2.50 cm MITRAL VALVE                TRICUSPID VALVE MV Area (PHT): 5.16 cm     TR Peak grad:   60.5 mmHg MV Decel Time: 147 msec     TR Vmax:        389.00 cm/s MV E  velocity: 115.00 cm/s MV A velocity: 47.10 cm/s   SHUNTS MV E/A ratio:  2.44         Systemic VTI:  0.28 m                             Systemic Diam: 2.00 cm Bartholome Bill MD Electronically signed by Bartholome Bill MD Signature Date/Time: 09/03/2019/10:54:07 AM    Final    DG Hip Unilat W or Wo Pelvis 2-3 Views Left  Result Date: 09/02/2019 CLINICAL DATA:  LEFT hip pain after fall EXAM: DG HIP (WITH OR WITHOUT PELVIS) 2-3V LEFT COMPARISON:  None. FINDINGS: Hips are located. No evidence of pelvic fracture or sacral fracture. Dedicated view of the LEFT hip demonstrates no femoral neck fracture. IMPRESSION: No fracture or dislocation Electronically Signed   By: Suzy Bouchard M.D.   On: 09/02/2019 13:05    SIGNED: Deatra James, MD, FACP, FHM. [facility],  Pager (please use [email or web address] to page/text)  If 7PM-7AM, please contact night-coverage [email or web address] Ota [facility] 09/03/2019, 12:10 PM

## 2019-09-03 NOTE — Progress Notes (Signed)
OT Cancellation Note  Patient Details Name: [name] MRN: [id number] DOB: 1941-02-07   Cancelled Treatment:    Reason Eval/Treat Not Completed: Other (comment). On second attempt, pt work Chartered certified accountant for pt care. Will re-attempt at later date/time as pt is available.   Jeni Salles, MPH, MS, OTR/L ascom [phone] 09/03/19, 4:09 PM

## 2019-09-03 NOTE — Progress Notes (Signed)
OT Cancellation Note  Patient Details Name: [name] MRN: [id number] DOB: October 18, 1940   Cancelled Treatment:    Reason Eval/Treat Not Completed: Patient at procedure or test/ unavailable. Order received, chart reviewed. Pt leaving for procedure at time of arrival; unable to complete evaluation at this time. Will re-attempt at a later time as pt is available and appropriate.   Jerilynn Birkenhead, OTS 09/03/19, 1:53 PM

## 2019-09-03 NOTE — Evaluation (Signed)
Physical Therapy Evaluation Patient Details Name: [name] MRN: [id number] DOB: 1940/08/08 Today's Date: 09/03/2019   History of Present Illness  Pt is an 79 y.o. female presenting to hospital 7/6 with reports of lightheadedness, vision darkening, and passing out (fell onto L hip and reporting L hip pain).  Pt noted to be bradycardic (HR 40-50's).  No fx's noted on imaging.  Pt admitted with syncope and fall at home.  PMH includes htn, a-fib, GERD, h/o Raynaud's syndrome, and L CTR.  Clinical Impression  Prior to hospital admission, pt was independent with ambulation; lives alone with her 2 dogs.  Nurse reporting taking orthostatics just prior to PT session without any significant changes.  Currently pt is independent with bed mobility, SBA with transfers, and CGA ambulating 160 feet (no AD).  HR 59-69 bpm at rest; HR increased up to 84 bpm with activity (except briefly in 90's bpm but quickly decreased back to 80's bpm).  O2 sats WFL on room air.  Pt would benefit from skilled PT to address noted impairments and functional limitations during hospitalization (see below for any additional details).  Upon hospital discharge, no further PT needs anticipated.    Follow Up Recommendations No PT follow up    Equipment Recommendations  None recommended by PT    Recommendations for Other Services       Precautions / Restrictions Precautions Precautions: Fall Restrictions Weight Bearing Restrictions: No      Mobility  Bed Mobility Overal bed mobility: Independent             General bed mobility comments: Supine to/from sit without any noted difficulties  Transfers Overall transfer level: Needs assistance Equipment used: None Transfers: Sit to/from Stand Sit to Stand: Supervision         General transfer comment: strong stand and controlled descent sitting noted  Ambulation/Gait Ambulation/Gait assistance: Min guard Gait Distance (Feet): 160 Feet Assistive device:  None Gait Pattern/deviations: Step-through pattern Gait velocity: mildly decreased   General Gait Details: steady ambulation  Stairs            Wheelchair Mobility    Modified Rankin (Stroke Patients Only)       Balance Overall balance assessment: Needs assistance Sitting-balance support: No upper extremity supported;Feet supported Sitting balance-Leahy Scale: Normal Sitting balance - Comments: steady sitting reaching outside BOS   Standing balance support: No upper extremity supported;During functional activity Standing balance-Leahy Scale: Good Standing balance comment: steady with ambulation and functional mobility                             Pertinent Vitals/Pain Pain Assessment: 0-10 Pain Score: 2  Pain Location: L hip soreness Pain Descriptors / Indicators: Sore Pain Intervention(s): Limited activity within patient's tolerance;Monitored during session;Premedicated before session;Repositioned  O2 sats WFL on room air.    Home Living Family/patient expects to be discharged to:: Private residence Living Arrangements: Alone (2 dogs)   Type of Home: House Home Access: Stairs to enter Entrance Stairs-Rails: Left Entrance Stairs-Number of Steps: 3 Home Layout: One level Home Equipment: Grab bars - tub/shower;Shower seat - built in      Prior Function Level of Independence: Independent         Comments: Pt reports no falls in past 6 months (other than recent syncope).     Hand Dominance        Extremity/Trunk Assessment   Upper Extremity Assessment Upper Extremity Assessment: Generalized weakness  Lower Extremity Assessment Lower Extremity Assessment: Generalized weakness    Cervical / Trunk Assessment Cervical / Trunk Assessment: Normal  Communication   Communication:  (mildly HOH)  Cognition Arousal/Alertness: Awake/alert Behavior During Therapy: WFL for tasks assessed/performed Overall Cognitive Status: Within Functional  Limits for tasks assessed                                        General Comments   Nursing cleared pt for participation in physical therapy.  Pt agreeable to PT session.    Exercises     Assessment/Plan    PT Assessment Patient needs continued PT services  PT Problem List Decreased strength;Decreased activity tolerance;Decreased mobility       PT Treatment Interventions Gait training;Stair training;Functional mobility training;Therapeutic activities;Therapeutic exercise;Patient/family education    PT Goals (Current goals can be found in the Care Plan section)  Acute Rehab PT Goals Patient Stated Goal: to be more active PT Goal Formulation: With patient Time For Goal Achievement: 09/17/19 Potential to Achieve Goals: Good    Frequency Min 2X/week   Barriers to discharge        Co-evaluation               AM-PAC PT "6 Clicks" Mobility  Outcome Measure Help needed turning from your back to your side while in a flat bed without using bedrails?: None Help needed moving from lying on your back to sitting on the side of a flat bed without using bedrails?: None Help needed moving to and from a bed to a chair (including a wheelchair)?: A Little Help needed standing up from a chair using your arms (e.g., wheelchair or bedside chair)?: A Little Help needed to walk in hospital room?: A Little Help needed climbing 3-5 steps with a railing? : A Little 6 Click Score: 20    End of Session Equipment Utilized During Treatment: Gait belt Activity Tolerance: Patient tolerated treatment well Patient left: in bed;with call bell/phone within reach;with bed alarm set Nurse Communication: Mobility status;Precautions;Other (comment) (pt's HR during session) PT Visit Diagnosis: Muscle weakness (generalized) (M62.81)    Time: 0964-3838 PT Time Calculation (min) (ACUTE ONLY): 20 min   Charges:   PT Evaluation $PT Eval Low Complexity: 1 Low         Erice Ahles, PT 09/03/19, 5:06 PM

## 2019-09-03 NOTE — Progress Notes (Signed)
*  PRELIMINARY RESULTS* Echocardiogram 2D Echocardiogram has been performed.  Sherrie Sport 09/03/2019, 9:40 AM

## 2019-09-03 NOTE — ED Notes (Signed)
This RN transported pt to room 231

## 2019-09-04 ENCOUNTER — Telehealth: Payer: Self-pay

## 2019-09-04 LAB — GLUCOSE, CAPILLARY: Glucose-Capillary: 93 mg/dL (ref 70–99)

## 2019-09-04 NOTE — Telephone Encounter (Signed)
noted 

## 2019-09-04 NOTE — Plan of Care (Signed)
  Problem: Education: Goal: Knowledge of condition and prescribed therapy will improve Outcome: Progressing   Problem: Cardiac: Goal: Will achieve and/or maintain adequate cardiac output Outcome: Progressing   

## 2019-09-04 NOTE — Progress Notes (Signed)
OT Cancellation Note  Patient Details Name: Mallery Harshman MRN: [id number] DOB: 16-Jan-1941   Cancelled Treatment:    Reason Eval/Treat Not Completed: OT screened, no needs identified, will sign off  OT consult received and chart reviewed. Upon chart review and screening/speaking with patient this AM, no acute OT needs identified. Will complete order and sign off at this time. Thank you.  Gerrianne Scale, [place], OTR/L ascom [phone] 09/04/19, 8:37 AM

## 2019-09-04 NOTE — Discharge Summary (Signed)
Physician Discharge Summary [facility]    Patient: [name]                   Admit date: 09/02/2019   DOB: 1940-09-01             Discharge date:09/04/2019/7:24 AM [id number]                         PCP: Venia Carbon, MD  Disposition: Home     Recommendations for Outpatient Follow-up:   . Follow up: in 1 week  . Follow with Dr. Cardiology in 1 week for Holter monitor placement.  Discharge Condition: Stable   Code Status:   Code Status: Full Code  Diet recommendation: Cardiac diet   Discharge Diagnoses:    Principal Problem:   Syncope Active Problems:   HYPERLIPIDEMIA   Essential hypertension   GERD   Atrial fibrillation ([place])   Depression   Fall at home, initial encounter   Syncope and collapse   History of Present Illness/ Hospital Course [name] Summary:    Per HPI:  Latreshia Simpsonis an 79 y.o.femalewith medical history significant ofhypertension, hyperlipidemia, GERD, depression, uterine cancer, aortic valve insufficiency, atrial fibrillation on Eliquis, who presents with syncope.  Per patient's daughter, patient passed out at about 10:30AM on 09/02/2019,and fell on the left hip, no head injury. No significant left hip pain. No head injury. Patient denies unilateral weakness, numbness or tingling inextremities. No facial droop or slurred speech.  No incontinency of bowel or bladder.   Heart rate with 40-50 on admission  Detailed discharge summary   Principal Problem:   Syncope Active Problems:   HYPERLIPIDEMIA   Essential hypertension   GERD   Atrial fibrillation (HCC)   Depression   Fall at home, initial encounter   Syncopeand fall at home, initial encounter: -Unknown etiology -likely bradycardia Cardiology was consulted, beta-blockers were held, heart rate has improved The rest of syncopal work-up which included an MRI, 2D cardio, bilateral carotid study were all within normal limits -Acute CVA, MI,  infection was ruled out -Noted for bradycardia -on admission heart rate 40s-50s, currently 66 -Seen by cardiology, will follow up in office for Holter monitor placement  Other differential diagnosis is broad, including vasovagal syncope, TIA,arrhythmia, orthostatic status, etc.  - Orthostatic vital signs -post hydration, HCTZ DC'd, -negative orthostatics - MRI-brain :  Within normal limits, chronic white matter small hemic changes noted - 2d echo:  Reviewed negative any acute abnormalities, ejection fraction 55-60%, - Neuro checks -if any acute focal neurological findings - start ASA -on Eliquis, aspirin DC'd - IVF:500ccNS, [id number] cc/h -DC'd - PT/OT eval and treat -no acute needs - Hold coreg  Atrial fibrillation (HCC)/ Bradycardia -Monitoring heart rate on the monitor bed -Continue Eliquis -Per cardiology recommendation DC'd Coreg -since patient has bradycardia   HYPERLIPIDEMIA -Pravastatin  Essential hypertension -Hold Coreg as above -Hold HCTZ since patient need IV fluid -Start oral hydralazine 25 mg 3 times daily -IV hydralazine as needed  GERD -Asymptomatic stable -Pepcid   Depression:  Stable, no homicidal suicidal ideation -Continue home medications   Disposition-patient be discharged home.   Nutritional status:          Discharge Instructions:   Discharge Instructions    Activity as tolerated - No restrictions   Complete by: As directed    Call MD for:  difficulty breathing, headache or visual disturbances   Complete by: As directed    Call MD  for:  persistant dizziness or light-headedness   Complete by: As directed    Call MD for:  temperature >100.4   Complete by: As directed    Diet - low sodium heart healthy   Complete by: As directed    Discharge instructions   Complete by: As directed    Follow-up with cardiology as an outpatient in anticipation of a Holter monitor placement.  Further recommendation continue to hold  beta-blocker, Coreg.   Increase activity slowly   Complete by: As directed        Medication List    STOP taking these medications   Calcium 600+D 600-400 MG-UNIT tablet Generic drug: Calcium Carbonate-Vitamin D   carvedilol 12.5 MG tablet Commonly known as: COREG   fexofenadine 180 MG tablet Commonly known as: ALLEGRA   guaiFENesin 600 MG 12 hr tablet Commonly known as: MUCINEX   hydrochlorothiazide 12.5 MG capsule Commonly known as: MICROZIDE   Pepto-Bismol 262 MG Tabs Generic drug: Bismuth Subsalicylate     TAKE these medications   azelastine 0.1 % nasal spray Commonly known as: ASTELIN Place 1 spray into both nostrils 2 (two) times daily. Use in each nostril as directed   Eliquis 2.5 MG Tabs tablet Generic drug: apixaban Take 2.5 mg by mouth 2 (two) times daily.   famotidine 20 MG tablet Commonly known as: PEPCID Take 20 mg by mouth daily.   FISH OIL PO Take 1 tablet by mouth daily.   glucosamine-chondroitin 500-400 MG tablet Take 1 tablet by mouth 2 (two) times daily. 2 am 1 pm   multivitamin capsule Take 1 capsule by mouth daily.   pravastatin 40 MG tablet Commonly known as: PRAVACHOL TAKE 1 TABLET BY MOUTH EVERYDAY AT BEDTIME What changed: See the new instructions.   pyridoxine 100 MG tablet Commonly known as: B-6 Take 100 mg by mouth daily.   sertraline 25 MG tablet Commonly known as: ZOLOFT TAKE 1 TABLET BY MOUTH EVERY DAY   TUMS PO Take by mouth as needed.   vitamin C 500 MG tablet Commonly known as: ASCORBIC ACID Take 500 mg by mouth daily.       Allergies  Allergen Reactions  . Amlodipine Besy-Benazepril Hcl Swelling    Lotrel caused swelling of feet & facial swelling unilaterally  . Lotrel [Amlodipine Besy-Benazepril Hcl]   . Molds & Smuts Other (See Comments)  . Wasp Venom Swelling    Allergic to wasps     Procedures /Studies:   CT HEAD WO CONTRAST  Result Date: 09/02/2019 CLINICAL DATA:  Syncopal episode EXAM: CT  HEAD WITHOUT CONTRAST TECHNIQUE: Contiguous axial images were obtained from the base of the skull through the vertex without intravenous contrast. COMPARISON:  None. FINDINGS: Brain: There is no acute intracranial hemorrhage, mass effect, or edema. Gray-white differentiation is preserved. There is no extra-axial fluid collection. Ventricles and sulci are within normal limits in size and configuration. Vascular: There is atherosclerotic calcification at the skull base. Skull: Calvarium is unremarkable. Sinuses/Orbits: No acute finding. Other: None. IMPRESSION: No acute intracranial abnormality. Electronically Signed   By: Macy Mis M.D.   On: 09/02/2019 12:47   MR BRAIN WO CONTRAST  Result Date: 09/02/2019 CLINICAL DATA:  Syncope EXAM: MRI HEAD WITHOUT CONTRAST TECHNIQUE: Multiplanar, multiecho pulse sequences of the brain and surrounding structures were obtained without intravenous contrast. COMPARISON:  None. FINDINGS: Brain: No acute infarct, acute hemorrhage or extra-axial collection. Multifocal white matter hyperintensity, most commonly due to chronic ischemic microangiopathy. Normal volume of CSF spaces. No  chronic microhemorrhage. Normal midline structures. Vascular: Normal flow voids. Skull and upper cervical spine: Normal marrow signal. Sinuses/Orbits: Negative. Other: None. IMPRESSION: 1. No acute intracranial abnormality. 2. Multifocal white matter hyperintensity, most commonly due to chronic ischemic microangiopathy. Electronically Signed   By: Ulyses Jarred M.D.   On: 09/02/2019 21:54   US Carotid Bilateral  Result Date: 09/03/2019 CLINICAL DATA:  Syncope. EXAM: BILATERAL CAROTID DUPLEX ULTRASOUND TECHNIQUE: Pearline Cables scale imaging, color Doppler and duplex ultrasound were performed of bilateral carotid and vertebral arteries in the neck. COMPARISON:  None. FINDINGS: Criteria: Quantification of carotid stenosis is based on velocity parameters that correlate the residual internal carotid diameter  with NASCET-based stenosis levels, using the diameter of the distal internal carotid lumen as the denominator for stenosis measurement. The following velocity measurements were obtained: RIGHT ICA: 123/23 cm/sec CCA: 341/93 cm/sec SYSTOLIC ICA/CCA RATIO:  1.2 ECA: 147 cm/sec LEFT ICA: 115/24 cm/sec CCA: 790/24 cm/sec SYSTOLIC ICA/CCA RATIO:  1.1 ECA: 120 cm/sec RIGHT CAROTID ARTERY: Small amount of echogenic plaque at the right carotid bulb. External carotid artery is patent with normal waveform. Normal waveforms and velocities in the internal carotid artery. RIGHT VERTEBRAL ARTERY: Antegrade flow and normal waveform in the right vertebral artery. LEFT CAROTID ARTERY: Small amount of heterogeneous plaque at the left carotid bulb. External carotid artery is patent with normal waveform. Normal waveforms and velocities in the internal carotid artery. LEFT VERTEBRAL ARTERY: Antegrade flow and normal waveform in the left vertebral artery. IMPRESSION: Mild atherosclerotic disease involving bilateral carotid arteries. Estimated degree of stenosis in the internal carotid arteries is less than 50% bilaterally. Patent vertebral arteries with antegrade flow. Electronically Signed   By: Markus Daft M.D.   On: 09/03/2019 14:19   ECHOCARDIOGRAM COMPLETE  Result Date: 09/03/2019    ECHOCARDIOGRAM REPORT   Patient Name:   AHLAYA ENDE Date of Exam: 09/03/2019 Medical Rec #:  [id number]        Height:       63.0 in Accession #:    [id number]       Weight:       126.0 lb Date of Birth:  November 26, 1940        BSA:          1.589 m Patient Age:    72 years         BP:           118/42 mmHg Patient Gender: F                HR:           52 bpm. Exam Location:  [facility] Procedure: 2D Echo, Cardiac Doppler and Color Doppler Indications:     Syncope 780.2  History:         Patient has no prior history of Echocardiogram examinations.                  Signs/Symptoms:Murmur; Risk Factors:Hypertension. Aortic                  insuffiency.   Sonographer:     Sherrie Sport RDCS (AE) Referring Phys:  [id number] Soledad Gerlach NIU Diagnosing Phys: Bartholome Bill MD IMPRESSIONS  1. Left ventricular ejection fraction, by estimation, is 55 to 60%. The left ventricle has normal function. The left ventricle has no regional wall motion abnormalities. Left ventricular diastolic parameters were normal.  2. Right ventricular systolic function is normal. The right ventricular size is normal. There is severely elevated pulmonary artery systolic pressure.  3. Left atrial size was mildly dilated.  4. The mitral valve is grossly normal. Mild to moderate mitral valve regurgitation.  5. The aortic valve is tricuspid. Aortic valve regurgitation is trivial. FINDINGS  Left Ventricle: Left ventricular ejection fraction, by estimation, is 55 to 60%. The left ventricle has normal function. The left ventricle has no regional wall motion abnormalities. The left ventricular internal cavity size was normal in size. There is  no left ventricular hypertrophy. Left ventricular diastolic parameters were normal. Right Ventricle: The right ventricular size is normal. No increase in right ventricular wall thickness. Right ventricular systolic function is normal. There is severely elevated pulmonary artery systolic pressure. The tricuspid regurgitant velocity is 3.89 m/s, and with an assumed right atrial pressure of 10 mmHg, the estimated right ventricular systolic pressure is 10.2 mmHg. Left Atrium: Left atrial size was mildly dilated. Right Atrium: Right atrial size was normal in size. Pericardium: There is no evidence of pericardial effusion. Mitral Valve: The mitral valve is grossly normal. Mild to moderate mitral valve regurgitation. Tricuspid Valve: The tricuspid valve is grossly normal. Tricuspid valve regurgitation is mild. Aortic Valve: The aortic valve is tricuspid. Aortic valve regurgitation is trivial. Aortic regurgitation PHT measures 771 msec. Aortic valve mean gradient measures 9.0 mmHg. Aortic  valve peak gradient measures 14.9 mmHg. Aortic valve area, by VTI measures 1.70 cm. Pulmonic Valve: The pulmonic valve was not well visualized. Pulmonic valve regurgitation is trivial. Aorta: The aortic root is normal in size and structure. IAS/Shunts: The interatrial septum was not assessed.  LEFT VENTRICLE PLAX 2D LVIDd:         5.12 cm  Diastology LVIDs:         3.28 cm  LV e' lateral:   8.59 cm/s LV PW:         0.88 cm  LV E/e' lateral: 13.4 LV IVS:        0.92 cm  LV e' medial:    6.53 cm/s LVOT diam:     2.00 cm  LV E/e' medial:  17.6 LV SV:         88 LV SV Index:   56 LVOT Area:     3.14 cm  RIGHT VENTRICLE RV Basal diam:  2.98 cm RV S prime:     10.10 cm/s TAPSE (M-mode): 3.8 cm LEFT ATRIUM             Index       RIGHT ATRIUM           Index LA diam:        4.40 cm 2.77 cm/m  RA Area:     17.90 cm LA Vol (A2C):   86.7 ml 54.57 ml/m RA Volume:   44.90 ml  28.26 ml/m LA Vol (A4C):   82.0 ml 51.61 ml/m LA Biplane Vol: 84.1 ml 52.93 ml/m  AORTIC VALVE                    PULMONIC VALVE AV Area (Vmax):    1.41 cm     PV Vmax:        0.73 m/s AV Area (Vmean):   1.44 cm     PV Peak grad:   2.1 mmHg AV Area (VTI):     1.70 cm     RVOT Peak grad: 3 mmHg AV Vmax:           193.00 cm/s AV Vmean:          140.000 cm/s AV  VTI:            0.520 m AV Peak Grad:      14.9 mmHg AV Mean Grad:      9.0 mmHg LVOT Vmax:         86.80 cm/s LVOT Vmean:        64.100 cm/s LVOT VTI:          0.281 m LVOT/AV VTI ratio: 0.54 AI PHT:            771 msec  AORTA Ao Root diam: 2.50 cm MITRAL VALVE                TRICUSPID VALVE MV Area (PHT): 5.16 cm     TR Peak grad:   60.5 mmHg MV Decel Time: 147 msec     TR Vmax:        389.00 cm/s MV E velocity: 115.00 cm/s MV A velocity: 47.10 cm/s   SHUNTS MV E/A ratio:  2.44         Systemic VTI:  0.28 m                             Systemic Diam: 2.00 cm Bartholome Bill MD Electronically signed by Bartholome Bill MD Signature Date/Time: 09/03/2019/10:54:07 AM    Final    DG Hip Unilat W or  Wo Pelvis 2-3 Views Left  Result Date: 09/02/2019 CLINICAL DATA:  LEFT hip pain after fall EXAM: DG HIP (WITH OR WITHOUT PELVIS) 2-3V LEFT COMPARISON:  None. FINDINGS: Hips are located. No evidence of pelvic fracture or sacral fracture. Dedicated view of the LEFT hip demonstrates no femoral neck fracture. IMPRESSION: No fracture or dislocation Electronically Signed   By: Suzy Bouchard M.D.   On: 09/02/2019 13:05     Subjective:   Patient was seen and examined 09/04/2019, 7:24 AM Patient stable today. No acute distress.  No issues overnight Stable for discharge.  Discharge Exam:    Vitals:   09/03/19 1445 09/03/19 1927 09/04/19 0511 09/04/19 0715  BP: (!) 164/52 (!) 145/55 (!) 162/50 (!) 127/46  Pulse: (!) 59 (!) 58 (!) 59 (!) 57  Resp: 18   19  Temp: 98.3 F (36.8 C) 98.7 F (37.1 C) 97.8 F (36.6 C) 97.9 F (36.6 C)  TempSrc: Oral Oral Oral Oral  SpO2: 98% 97% 97% 97%  Weight:   58.1 kg   Height:        General: Pt lying comfortably in bed & appears in no obvious distress. Cardiovascular: S1 & S2 heard, RRR, S1/S2 +. No murmurs, rubs, gallops or clicks. No JVD or pedal edema. Respiratory: Clear to auscultation without wheezing, rhonchi or crackles. No increased work of breathing. Abdominal:  Non-distended, non-tender & soft. No organomegaly or masses appreciated. Normal bowel sounds heard. CNS: Alert and oriented. No focal deficits. Extremities: no edema, no cyanosis    The results of significant diagnostics from this hospitalization (including imaging, microbiology, ancillary and laboratory) are listed below for reference.      Microbiology:   Recent Results (from the past 240 hour(s))  SARS Coronavirus 2 by RT PCR (hospital order, performed in [facility] hospital lab) Nasopharyngeal Nasopharyngeal Swab     Status: None   Collection Time: 09/02/19  1:45 PM   Specimen: Nasopharyngeal Swab  Result Value Ref Range Status   SARS Coronavirus 2 NEGATIVE NEGATIVE Final      Comment: (NOTE) SARS-CoV-2 target nucleic acids are  NOT DETECTED.  The SARS-CoV-2 RNA is generally detectable in upper and lower respiratory specimens during the acute phase of infection. The lowest concentration of SARS-CoV-2 viral copies this assay can detect is 250 copies / mL. A negative result does not preclude SARS-CoV-2 infection and should not be used as the sole basis for treatment or other patient management decisions.  A negative result may occur with improper specimen collection / handling, submission of specimen other than nasopharyngeal swab, presence of viral mutation(s) within the areas targeted by this assay, and inadequate number of viral copies (<250 copies / mL). A negative result must be combined with clinical observations, patient history, and epidemiological information.  Fact Sheet for Patients:   StrictlyIdeas.no  Fact Sheet for Healthcare Providers: BankingDealers.co.za  This test is not yet approved or  cleared by the [place] FDA and has been authorized for detection and/or diagnosis of SARS-CoV-2 by FDA under an Emergency Use Authorization (EUA).  This EUA will remain in effect (meaning this test can be used) for the duration of the COVID-19 declaration under Section 564(b)(1) of the Act, 21 U.S.C. section 360bbb-3(b)(1), unless the authorization is terminated or revoked sooner.  Performed at [facility], [address] [place], [place] [address]      Labs:   CBC: Recent Labs  Lab 09/02/19 1216 09/03/19 0332  WBC 5.2 6.0  NEUTROABS 2.7  --   HGB 12.1 9.8*  HCT 34.1* 27.8*  MCV 92.4 93.0  PLT 205 974   Basic Metabolic Panel: Recent Labs  Lab 09/02/19 1216 09/03/19 0332  NA 138 139  K 3.5 3.5  CL 100 106  CO2 26 27  GLUCOSE 127* 107*  BUN 19 19  CREATININE 0.72 0.65  CALCIUM 9.1 8.3*  MG 2.0  --    Liver Function Tests: Recent Labs  Lab 09/02/19 1216  AST 28   ALT 21  ALKPHOS 40  BILITOT 1.4*  PROT 7.3  ALBUMIN 4.4   BNP (last 3 results) No results for input(s): BNP in the last 8760 hours. Cardiac Enzymes: No results for input(s): CKTOTAL, CKMB, CKMBINDEX, TROPONINI in the last 168 hours. CBG: Recent Labs  Lab 09/04/19 0550  GLUCAP 93   Hgb A1c No results for input(s): HGBA1C in the last 72 hours. Lipid Profile No results for input(s): CHOL, HDL, LDLCALC, TRIG, CHOLHDL, LDLDIRECT in the last 72 hours. Thyroid function studies Recent Labs    09/02/19 1216  TSH 4.983*   Anemia work up No results for input(s): VITAMINB12, FOLATE, FERRITIN, TIBC, IRON, RETICCTPCT in the last 72 hours. Urinalysis    Component Value Date/Time   COLORURINE AMBER (A) 09/02/2019 1303   APPEARANCEUR HAZY (A) 09/02/2019 1303   LABSPEC 1.015 09/02/2019 1303   PHURINE 8.0 09/02/2019 1303   GLUCOSEU NEGATIVE 09/02/2019 1303   GLUCOSEU NEGATIVE 04/12/2009 0926   HGBUR NEGATIVE 09/02/2019 1303   BILIRUBINUR NEGATIVE 09/02/2019 1303   KETONESUR NEGATIVE 09/02/2019 1303   PROTEINUR 30 (A) 09/02/2019 1303   UROBILINOGEN 0.2 04/12/2009 0926   NITRITE NEGATIVE 09/02/2019 1303   LEUKOCYTESUR NEGATIVE 09/02/2019 1303         Time coordinating discharge: Over 45 minutes  SIGNED: Deatra James, MD, FACP, FHM. [facility],  Please use [email or web address] to Page If 7PM-7AM, please contact night-coverage [email or web address] [facility] 09/04/2019, 7:24 AM

## 2019-09-04 NOTE — Telephone Encounter (Signed)
Transition Care Management Follow-up Telephone Call  Date of discharge and from where: 09/04/2019, [facility]  How have you been since you were released from the hospital? Patient is doing better since her discharge.   Any questions or concerns? No   Items Reviewed:  Did the pt receive and understand the discharge instructions provided? Yes   Medications obtained and verified? Yes   Any new allergies since your discharge? No   Dietary orders reviewed? Yes  Do you have support at home? Yes   Functional Questionnaire: (I = Independent and D = Dependent) ADLs: I  Bathing/Dressing- I  Meal Prep- I  Eating- I  Maintaining continence- I  Transferring/Ambulation- I  Managing Meds- I  Follow up appointments reviewed:   PCP Hospital f/u appt confirmed? Yes  Scheduled to see Dr. Silvio Pate on 09/11/2019 @ 3:45 pm. This is her physical and patient was adamant about keeping this appointment.   [facility] f/u appt confirmed? Yes  Scheduled to see cardiology on 09/19/2019.  Are transportation arrangements needed? No   If their condition worsens, is the pt aware to call PCP or go to the Emergency Dept.? Yes  Was the patient provided with contact information for the PCP's office or ED? Yes  Was to pt encouraged to call back with questions or concerns? Yes

## 2019-09-11 ENCOUNTER — Ambulatory Visit (INDEPENDENT_AMBULATORY_CARE_PROVIDER_SITE_OTHER): Payer: Medicare Other | Admitting: Internal Medicine

## 2019-09-11 ENCOUNTER — Other Ambulatory Visit: Payer: Self-pay

## 2019-09-11 ENCOUNTER — Encounter: Payer: Self-pay | Admitting: Internal Medicine

## 2019-09-11 DIAGNOSIS — Z7189 Other specified counseling: Secondary | ICD-10-CM

## 2019-09-11 DIAGNOSIS — Z0000 Encounter for general adult medical examination without abnormal findings: Secondary | ICD-10-CM

## 2019-09-11 DIAGNOSIS — F39 Unspecified mood [affective] disorder: Secondary | ICD-10-CM

## 2019-09-11 DIAGNOSIS — I480 Paroxysmal atrial fibrillation: Secondary | ICD-10-CM | POA: Diagnosis not present

## 2019-09-11 DIAGNOSIS — D649 Anemia, unspecified: Secondary | ICD-10-CM | POA: Diagnosis not present

## 2019-09-11 DIAGNOSIS — R55 Syncope and collapse: Secondary | ICD-10-CM

## 2019-09-11 NOTE — Assessment & Plan Note (Signed)
I have personally reviewed the Medicare Annual Wellness questionnaire and have noted 1. The patient's medical and social history 2. Their use of alcohol, tobacco or illicit drugs 3. Their current medications and supplements 4. The patient's functional ability including ADL's, fall risks, home safety risks and hearing or visual             impairment. 5. Diet and physical activities 6. Evidence for depression or mood disorders  The patients weight, height, BMI and visual acuity have been recorded in the chart I have made referrals, counseling and provided education to the patient based review of the above and I have provided the pt with a written personalized care plan for preventive services.  I have provided you with a copy of your personalized plan for preventive services. Please take the time to review along with your updated medication list.  Discussed staying active Flu vaccine in the fall Consider the shingrix at the pharmacy No cancer screening due to age

## 2019-09-11 NOTE — Assessment & Plan Note (Signed)
Doing better --mostly dysthymia Will continue low dose sertraline

## 2019-09-11 NOTE — Assessment & Plan Note (Signed)
Hgb 12.1 on admission but down to 9.8 Will recheck at follow up

## 2019-09-11 NOTE — Assessment & Plan Note (Signed)
Reviewed hospital records Now off carvedilol (in case the bradycardia was the cause) and hydralazine (in case low BP causing) Feels some better now Has cardiology follow up next week

## 2019-09-11 NOTE — Assessment & Plan Note (Signed)
Only one documented episode---here  Still on the apixaban now

## 2019-09-11 NOTE — Assessment & Plan Note (Signed)
See social history 

## 2019-09-11 NOTE — Progress Notes (Signed)
Hearing Screening   125Hz  250Hz  500Hz  1000Hz  2000Hz  3000Hz  4000Hz  6000Hz  8000Hz   Right ear:           Left ear:           Comments: Patient has hearing aids wearing them today  Vision Screening Comments: November 2020

## 2019-09-11 NOTE — Progress Notes (Signed)
Subjective:    Patient ID: [name], female    DOB: 1941-02-20, 79 y.o.   MRN: [id number]  HPI Here for Medicare wellness visit and hospital follow up Daughter [name] is here This visit occurred during the SARS-CoV-2 public health emergency.  Safety protocols were in place, including screening questions prior to the visit, additional usage of staff PPE, and extensive cleaning of exam room while observing appropriate contact time as indicated for disinfecting solutions.   Reviewed form and advanced directives Reviewed other doctors No alcohol or tobacco Tries to walk regularly Vision is fair Hearing is okay with aides [place] with the syncope---no sig injury Chronic mood issues are better Driving till the fall. Independent with instrumental ADLs No apparent memory issues  Passed out ---started feeling "woozy/shaky" Then eyes closed---woke up on floor Not sure how long she was out Called daughter [name]---she called EMS More alert by the time she got to ER Some SOB then as well Evaluation for injury negative Did have some bradycardia and low BP---carvedilolol hydralazine both dropped Had MRI of brain, echocardiogram and carotid studies were all non diagnostic Started to feel better  Home for a week Has been careful not to push herself Felt wobbly one morning upon standing---passed quickly No SOB No chest pain No palpitations---but has sense of heart pumping at times No ankle swelling Going to Dr Nehemiah Massed next week---will have monitor and stress test apparently  Has been taking prevagen occasionally Discussed  Mood seems okay Daughter and she feel it is helping Sleeps okay  Occasional heartburn---uses famotidine (or tums) No dysphagia  Current Outpatient Medications on File Prior to Visit  Medication Sig Dispense Refill   apixaban (ELIQUIS) 2.5 MG TABS tablet Take 2.5 mg by mouth 2 (two) times daily.     Apoaequorin (PREVAGEN PO) Take by mouth.      azelastine (ASTELIN) 0.1 % nasal spray Place 1 spray into both nostrils 2 (two) times daily. Use in each nostril as directed 30 mL 5   famotidine (PEPCID) 20 MG tablet Take 20 mg by mouth daily.     glucosamine-chondroitin 500-400 MG tablet Take 1 tablet by mouth 2 (two) times daily. 1 pm     loratadine (CLARITIN) 10 MG tablet Take 10 mg by mouth daily.     Multiple Vitamin (MULTIVITAMIN) capsule Take 1 capsule by mouth daily.     Omega-3 Fatty Acids (FISH OIL PO) Take 1 tablet by mouth daily.     pravastatin (PRAVACHOL) 40 MG tablet TAKE 1 TABLET BY MOUTH EVERYDAY AT BEDTIME (Patient taking differently: Take 40 mg by mouth at bedtime. ) 90 tablet 3   pyridoxine (B-6) 100 MG tablet Take 100 mg by mouth daily.     sertraline (ZOLOFT) 25 MG tablet TAKE 1 TABLET BY MOUTH EVERY DAY (Patient taking differently: Take 25 mg by mouth daily. ) 90 tablet 3   vitamin B-12 (CYANOCOBALAMIN) 500 MCG tablet Take 500 mcg by mouth daily.     vitamin C (ASCORBIC ACID) 500 MG tablet Take 500 mg by mouth daily.     No current facility-administered medications on file prior to visit.    Allergies  Allergen Reactions   Amlodipine Besy-Benazepril Hcl Swelling    Lotrel caused swelling of feet & facial swelling unilaterally   Lotrel [Amlodipine Besy-Benazepril Hcl]    Molds & Smuts Other (See Comments)   Wasp Venom Swelling    Allergic to wasps    Past Medical History:  Diagnosis Date   Adenomatous  colon polyp 2005   Allergy    perennial ; shots from Dr Velora Heckler   Aortic insufficiency    Hyperlipidemia    Hypertension    Murmur    a.  04/2011 Echo:  EF 55-60%, mild-mod ai/mr   Uterine cancer ([name])     Past Surgical History:  Procedure Laterality Date   BLADDER SUSPENSION     CARPAL TUNNEL RELEASE Left    COLONOSCOPY  multiple   GLAUCOMA SURGERY     "for narrow lines" (? narrow angle?)   INTRAOCULAR LENS INSERTION Bilateral 10/22/2014; 11/26/2014   TOTAL ABDOMINAL  HYSTERECTOMY W/ BILATERAL SALPINGOOPHORECTOMY     abnormal PAP    Family History  Problem Relation Age of Onset   Stroke Mother 70   Heart failure Mother    Heart attack Father 19   Lung cancer Father    Cancer Maternal Aunt         X 2; Gyn & ? 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  Tobacco Use   Smoking status: Never Smoker   Smokeless tobacco: Never Used  Scientific laboratory technician Use: Never used  Substance and Sexual Activity   Alcohol use: Yes    Comment: special events   Drug use: No   Sexual activity: Never  Other Topics Concern   Not on file  Social History Narrative   Widowed 12/19      Has living will   Children now would be health care POA   Would accept resuscitation but no prolonged ventilation   No tube feeds if cognitively unaware      2 daughters, 4 grandsons 1 great-grandson   Retired Medical sales representative from [place] Strain:    Difficulty of Paying Living Expenses:   Food Insecurity:    Worried About Charity fundraiser in the Last Year:    Arboriculturist in the Last Year:   Transportation Needs:    Film/video editor (Medical):    Lack of Transportation (Non-Medical):   Physical Activity:    Days of Exercise per Week:    Minutes of Exercise per Session:   Stress:    Feeling of Stress :   Social Connections:    Frequency of Communication with Friends and Family:    Frequency of Social Gatherings with Friends and Family:    Attends Religious Services:    Active Member of Clubs or  Organizations:    Attends Music therapist:    Marital Status:   Intimate Partner Violence:    Fear of Current or Ex-Partner:    Emotionally Abused:    Physically Abused:    Sexually Abused:    Review of Systems Appetite is pretty good Weight is up slightly Wears seat belt Bowels are okay---some urgency if she eats too much fruit Occasional scattered joint aches    Objective:   Physical Exam Constitutional:  Appearance: Normal appearance.  HENT:     Nose:     Comments: No lesions    Mouth/Throat:     Mouth: Mucous membranes are moist.  Cardiovascular:     Rate and Rhythm: Normal rate and regular rhythm.     Pulses: Normal pulses.     Heart sounds: No murmur heard.  No gallop.   Pulmonary:     Effort: Pulmonary effort is normal.     Breath sounds: Normal breath sounds. No wheezing or rales.  Abdominal:     Palpations: Abdomen is soft.     Tenderness: There is no abdominal tenderness.  Musculoskeletal:     Cervical back: Neck supple.     Right lower leg: No edema.     Left lower leg: No edema.  Lymphadenopathy:     Cervical: No cervical adenopathy.  Skin:    Findings: No rash.  Neurological:     Mental Status: She is alert and oriented to person, place, and time.     Comments: President---"Biden, Trump, Obama" [id number] Recall 3/3  Psychiatric:        Mood and Affect: Mood normal.        Behavior: Behavior normal.            Assessment & Plan:

## 2019-09-19 DIAGNOSIS — R001 Bradycardia, unspecified: Secondary | ICD-10-CM | POA: Diagnosis not present

## 2019-09-19 DIAGNOSIS — I480 Paroxysmal atrial fibrillation: Secondary | ICD-10-CM | POA: Diagnosis not present

## 2019-09-19 DIAGNOSIS — R55 Syncope and collapse: Secondary | ICD-10-CM | POA: Diagnosis not present

## 2019-09-19 DIAGNOSIS — R0602 Shortness of breath: Secondary | ICD-10-CM | POA: Diagnosis not present

## 2019-09-23 DIAGNOSIS — J3089 Other allergic rhinitis: Secondary | ICD-10-CM | POA: Diagnosis not present

## 2019-09-30 DIAGNOSIS — R0600 Dyspnea, unspecified: Secondary | ICD-10-CM | POA: Diagnosis not present

## 2019-09-30 DIAGNOSIS — I340 Nonrheumatic mitral (valve) insufficiency: Secondary | ICD-10-CM | POA: Diagnosis not present

## 2019-10-11 ENCOUNTER — Other Ambulatory Visit: Payer: Self-pay | Admitting: Internal Medicine

## 2019-10-14 DIAGNOSIS — R0602 Shortness of breath: Secondary | ICD-10-CM | POA: Diagnosis not present

## 2019-10-14 DIAGNOSIS — I340 Nonrheumatic mitral (valve) insufficiency: Secondary | ICD-10-CM | POA: Diagnosis not present

## 2019-10-14 DIAGNOSIS — R55 Syncope and collapse: Secondary | ICD-10-CM | POA: Diagnosis not present

## 2019-10-14 DIAGNOSIS — E782 Mixed hyperlipidemia: Secondary | ICD-10-CM | POA: Diagnosis not present

## 2019-10-14 DIAGNOSIS — I480 Paroxysmal atrial fibrillation: Secondary | ICD-10-CM | POA: Diagnosis not present

## 2019-10-14 DIAGNOSIS — R001 Bradycardia, unspecified: Secondary | ICD-10-CM | POA: Diagnosis not present

## 2019-10-23 DIAGNOSIS — J3089 Other allergic rhinitis: Secondary | ICD-10-CM | POA: Diagnosis not present

## 2019-11-07 DIAGNOSIS — Z961 Presence of intraocular lens: Secondary | ICD-10-CM | POA: Diagnosis not present

## 2019-11-07 DIAGNOSIS — Z947 Corneal transplant status: Secondary | ICD-10-CM | POA: Diagnosis not present

## 2019-11-07 DIAGNOSIS — H18519 Endothelial corneal dystrophy, unspecified eye: Secondary | ICD-10-CM | POA: Diagnosis not present

## 2019-11-12 ENCOUNTER — Ambulatory Visit: Payer: Medicare Other | Admitting: Internal Medicine

## 2019-11-18 DIAGNOSIS — J3089 Other allergic rhinitis: Secondary | ICD-10-CM | POA: Diagnosis not present

## 2019-11-24 ENCOUNTER — Ambulatory Visit: Payer: Medicare Other | Admitting: Dermatology

## 2019-11-26 ENCOUNTER — Ambulatory Visit (INDEPENDENT_AMBULATORY_CARE_PROVIDER_SITE_OTHER): Payer: Medicare Other | Admitting: Dermatology

## 2019-11-26 ENCOUNTER — Other Ambulatory Visit: Payer: Self-pay

## 2019-11-26 ENCOUNTER — Encounter: Payer: Self-pay | Admitting: Dermatology

## 2019-11-26 DIAGNOSIS — Z1283 Encounter for screening for malignant neoplasm of skin: Secondary | ICD-10-CM

## 2019-11-26 DIAGNOSIS — L820 Inflamed seborrheic keratosis: Secondary | ICD-10-CM

## 2019-11-26 DIAGNOSIS — L814 Other melanin hyperpigmentation: Secondary | ICD-10-CM

## 2019-11-26 DIAGNOSIS — D1800 Hemangioma unspecified site: Secondary | ICD-10-CM | POA: Diagnosis not present

## 2019-11-26 DIAGNOSIS — L821 Other seborrheic keratosis: Secondary | ICD-10-CM

## 2019-11-26 DIAGNOSIS — L578 Other skin changes due to chronic exposure to nonionizing radiation: Secondary | ICD-10-CM | POA: Diagnosis not present

## 2019-11-26 DIAGNOSIS — D229 Melanocytic nevi, unspecified: Secondary | ICD-10-CM | POA: Diagnosis not present

## 2019-11-26 NOTE — Progress Notes (Signed)
° °  New Patient Visit  Subjective  [name] is a 79 y.o. female who presents for the following: Other (Spots of arms and back that are scaly. UBSE today.). She has several areas to be evaluated. The patient presents for Upper Body Skin Exam (UBSE) for skin cancer screening and mole check.  The following portions of the chart were reviewed this encounter and updated as appropriate:  Tobacco   Allergies   Meds   Problems   Med Hx   Surg Hx   Fam Hx      Review of Systems:  No other skin or systemic complaints except as noted in HPI or Assessment and Plan.  Objective  Well appearing patient in no apparent distress; mood and affect are within normal limits.  All skin waist up examined.  Objective  Arms/trunk (23): Erythematous keratotic or waxy stuck-on papule or plaque.    Assessment & Plan    Lentigines - Scattered tan macules - Discussed due to sun exposure - Benign, observe - Call for any changes  Seborrheic Keratoses - Stuck-on, waxy, tan-brown papules and plaques  - Discussed benign etiology and prognosis. - Observe - Call for any changes  Melanocytic Nevi - Tan-brown and/or pink-flesh-colored symmetric macules and papules - Benign appearing on exam today - Observation - Call clinic for new or changing moles - Recommend daily use of broad spectrum spf 30+ sunscreen to sun-exposed areas.   Hemangiomas - Red papules - Discussed benign nature - Observe - Call for any changes  Actinic Damage - diffuse scaly erythematous macules with underlying dyspigmentation - Recommend daily broad spectrum sunscreen SPF 30+ to sun-exposed areas, reapply every 2 hours as needed.  - Call for new or changing lesions.  Skin cancer screening performed today.   Inflamed seborrheic keratosis (23) Arms/trunk  Destruction of lesion - Arms/trunk Complexity: simple   Destruction method: cryotherapy   Informed consent: discussed and consent obtained   Timeout:  patient  name, date of birth, surgical site, and procedure verified Lesion destroyed using liquid nitrogen: Yes   Region frozen until ice ball extended beyond lesion: Yes   Outcome: patient tolerated procedure well with no complications   Post-procedure details: wound care instructions given    Skin cancer screening  Return in about 6 weeks (around 01/07/2020).  I, Ashok Cordia, CMA, am acting as scribe for Sarina Ser, MD .  Documentation: I have reviewed the above documentation for accuracy and completeness, and I agree with the above.  Sarina Ser, MD

## 2019-11-26 NOTE — Patient Instructions (Signed)

## 2019-11-29 ENCOUNTER — Other Ambulatory Visit: Payer: Self-pay

## 2019-11-29 ENCOUNTER — Ambulatory Visit (INDEPENDENT_AMBULATORY_CARE_PROVIDER_SITE_OTHER): Payer: Medicare Other

## 2019-11-29 DIAGNOSIS — Z23 Encounter for immunization: Secondary | ICD-10-CM | POA: Diagnosis not present

## 2019-12-02 DIAGNOSIS — J3089 Other allergic rhinitis: Secondary | ICD-10-CM | POA: Diagnosis not present

## 2019-12-03 ENCOUNTER — Encounter: Payer: Self-pay | Admitting: Internal Medicine

## 2019-12-03 ENCOUNTER — Other Ambulatory Visit: Payer: Self-pay

## 2019-12-03 ENCOUNTER — Ambulatory Visit (INDEPENDENT_AMBULATORY_CARE_PROVIDER_SITE_OTHER): Payer: Medicare Other | Admitting: Internal Medicine

## 2019-12-03 VITALS — BP 110/60 | HR 71 | Temp 97.5°F | Ht 63.0 in | Wt 125.0 lb

## 2019-12-03 DIAGNOSIS — R55 Syncope and collapse: Secondary | ICD-10-CM | POA: Diagnosis not present

## 2019-12-03 DIAGNOSIS — I779 Disorder of arteries and arterioles, unspecified: Secondary | ICD-10-CM | POA: Insufficient documentation

## 2019-12-03 DIAGNOSIS — I10 Essential (primary) hypertension: Secondary | ICD-10-CM

## 2019-12-03 DIAGNOSIS — I480 Paroxysmal atrial fibrillation: Secondary | ICD-10-CM | POA: Diagnosis not present

## 2019-12-03 DIAGNOSIS — I6523 Occlusion and stenosis of bilateral carotid arteries: Secondary | ICD-10-CM

## 2019-12-03 DIAGNOSIS — D649 Anemia, unspecified: Secondary | ICD-10-CM

## 2019-12-03 LAB — RENAL FUNCTION PANEL
Albumin: 4.4 g/dL (ref 3.5–5.2)
BUN: 22 mg/dL (ref 6–23)
CO2: 30 mEq/L (ref 19–32)
Calcium: 9.3 mg/dL (ref 8.4–10.5)
Chloride: 102 mEq/L (ref 96–112)
Creatinine, Ser: 0.59 mg/dL (ref 0.40–1.20)
GFR: 86.22 mL/min (ref >60.00)
Glucose, Bld: 102 mg/dL — ABNORMAL HIGH (ref 70–99)
Phosphorus: 3 mg/dL (ref 2.3–4.6)
Potassium: 4.1 mEq/L (ref 3.5–5.1)
Sodium: 139 mEq/L (ref 135–145)

## 2019-12-03 LAB — IBC + FERRITIN
Ferritin: 180.5 ng/mL (ref 10.0–291.0)
Iron: 111 ug/dL (ref 42–145)
Saturation Ratios: 29.5 % (ref 20.0–50.0)
Transferrin: 269 mg/dL (ref 212.0–360.0)

## 2019-12-03 LAB — CBC
HCT: 35 % — ABNORMAL LOW (ref 36.0–46.0)
Hemoglobin: 12.1 g/dL (ref 12.0–15.0)
MCHC: 34.6 g/dL (ref 30.0–36.0)
MCV: 96.3 fl (ref 78.0–100.0)
Platelets: 225 10*3/uL (ref 150.0–400.0)
RBC: 3.63 Mil/uL — ABNORMAL LOW (ref 3.87–5.11)
RDW: 16.2 % — ABNORMAL HIGH (ref 11.5–15.5)
WBC: 5.3 10*3/uL (ref 4.0–10.5)

## 2019-12-03 LAB — IRON: Iron: 111 ug/dL (ref 42–145)

## 2019-12-03 NOTE — Assessment & Plan Note (Signed)
Moderate stenosis Is on the pravastatin

## 2019-12-03 NOTE — Assessment & Plan Note (Signed)
No recurrence since off BP meds

## 2019-12-03 NOTE — Assessment & Plan Note (Signed)
Will check labs again 

## 2019-12-03 NOTE — Assessment & Plan Note (Signed)
BP Readings from Last 3 Encounters:  12/03/19 110/60  09/11/19 (!) 148/82  09/04/19 (!) 127/46   Good control despite no medications now

## 2019-12-03 NOTE — Assessment & Plan Note (Signed)
No apparent recurrence Not on carvedilol anymore Is on low dose eliquis--appropriate for her

## 2019-12-03 NOTE — Progress Notes (Signed)
Subjective:    Patient ID: [name], female    DOB: 1940-07-29, 79 y.o.   MRN: [id number]  HPI Here for follow up after syncope 2 months ago This visit occurred during the SARS-CoV-2 public health emergency.  Safety protocols were in place, including screening questions prior to the visit, additional usage of staff PPE, and extensive cleaning of exam room while observing appropriate contact time as indicated for disinfecting solutions.   No dizziness since the last visit Does walk regularly DOE seems better No more leg weakness--since off the BP meds (carvedilol/hydralazine) One episode when stressed---felt her heart a little (but not a clear palpitations)  Reviewed cardiology reports Carotids show mild to moderate stenosis bilaterally Stress test reassuring Echo okay---normal EF but some MR  Anemia noted during the hospital stay Feels bette now  Current Outpatient Medications on File Prior to Visit  Medication Sig Dispense Refill  . apixaban (ELIQUIS) 2.5 MG TABS tablet Take 2.5 mg by mouth 2 (two) times daily.    Marland Kitchen azelastine (ASTELIN) 0.1 % nasal spray Place 1 spray into both nostrils 2 (two) times daily. Use in each nostril as directed 30 mL 5  . famotidine (PEPCID) 20 MG tablet Take 20 mg by mouth daily.    Marland Kitchen glucosamine-chondroitin 500-400 MG tablet Take 1 tablet by mouth 2 (two) times daily. 1 pm    . loratadine (CLARITIN) 10 MG tablet Take 10 mg by mouth daily.    . Multiple Vitamin (MULTIVITAMIN) capsule Take 1 capsule by mouth daily.    . Omega-3 Fatty Acids (FISH OIL PO) Take 1 tablet by mouth daily.    . pravastatin (PRAVACHOL) 40 MG tablet TAKE 1 TABLET BY MOUTH EVERYDAY AT BEDTIME (Patient taking differently: Take 40 mg by mouth at bedtime. ) 90 tablet 3  . sertraline (ZOLOFT) 25 MG tablet TAKE 1 TABLET BY MOUTH EVERY DAY 90 tablet 3  . vitamin B-12 (CYANOCOBALAMIN) 500 MCG tablet Take 500 mcg by mouth daily.    Marland Kitchen Apoaequorin (PREVAGEN PO) Take by mouth.  (Patient not taking: Reported on 12/03/2019)     No current facility-administered medications on file prior to visit.    Allergies  Allergen Reactions  . Amlodipine Besy-Benazepril Hcl Swelling    Lotrel caused swelling of feet & facial swelling unilaterally  . Lotrel [Amlodipine Besy-Benazepril Hcl]   . Molds & Smuts Other (See Comments)  . Wasp Venom Swelling    Allergic to wasps    Past Medical History:  Diagnosis Date  . Adenomatous colon polyp 2005  . Allergy    perennial ; shots from Dr Velora Heckler  . Aortic insufficiency   . Hyperlipidemia   . Hypertension   . Murmur    a.  04/2011 Echo:  EF 55-60%, mild-mod ai/mr  . Uterine cancer [facility])     Past Surgical History:  Procedure Laterality Date  . BLADDER SUSPENSION    . CARPAL TUNNEL RELEASE Left   . COLONOSCOPY  multiple  . GLAUCOMA SURGERY     "for narrow lines" (? narrow angle?)  . INTRAOCULAR LENS INSERTION Bilateral 10/22/2014; 11/26/2014  . TOTAL ABDOMINAL HYSTERECTOMY W/ BILATERAL SALPINGOOPHORECTOMY     abnormal PAP    Family History  Problem Relation Age of Onset  . Stroke Mother 10  . Heart failure Mother   . Heart attack Father 29  . Lung cancer Father   . Cancer Maternal Aunt         X 2; Gyn & ? primary  .  Diabetes Maternal Grandmother   . Esophageal cancer Neg Hx   . Colon cancer Neg Hx   . Stomach cancer Neg Hx   . Rectal cancer Neg Hx     Social History   Socioeconomic History  . Marital status: Widowed    Spouse name: Not on file  . Number of children: 2  . Years of education: Not on file  . Highest education level: Not on file  Occupational History  . 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 Smoking status: Never Smoker  . Smokeless tobacco: Never Used  Vaping Use  . Vaping Use: Never used  Substance and Sexual Activity  . Alcohol use: Yes    Comment: special events  . Drug use: No  . Sexual activity: Never  Other Topics Concern  . 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 Difficulty of Paying Living Expenses: Not on file  Food Insecurity:   . Worried About Charity fundraiser in the Last Year: Not on file  . Ran Out of Food in the Last Year: Not on file  Transportation Needs:   . Lack of Transportation (Medical): Not on file  . Lack of Transportation (Non-Medical): Not on file  Physical Activity:   . Days of Exercise per Week: Not on file  . Minutes of Exercise per Session: Not on file  Stress:   . Feeling of Stress : Not on file  Social Connections:   . Frequency of Communication with Friends and Family: Not on file  . Frequency of Social Gatherings with Friends and Family: Not on file  . Attends Religious Services: Not on file  . Active Member of Clubs or Organizations: Not on file  . Attends Archivist Meetings: Not on file  . Marital Status: Not on file  Intimate Partner Violence:   . Fear of Current or Ex-Partner: Not on file  . Emotionally Abused: Not on file  . Physically Abused: Not on file  . Sexually Abused: Not on file   Review of Systems Appetite is good Weight stable Still some grieving---"trying to move on" Sleeps okay mostly. Does have nocturia---and some incontinence    Objective:   Physical Exam Constitutional:      Appearance: Normal appearance.  Cardiovascular:     Rate and Rhythm: Normal rate and regular rhythm.     Heart sounds: No murmur heard.  No gallop.      Comments: Rate ~70 Pulmonary:     Effort: Pulmonary effort is normal.     Breath sounds: Normal breath sounds. No wheezing or rales.  Musculoskeletal:     Cervical back: Neck supple.     Right  lower leg: No edema.     Left lower leg: No edema.  Lymphadenopathy:     Cervical: No cervical adenopathy.  Neurological:     Mental Status: She is alert.  Psychiatric:        Mood and Affect: Mood normal.        Behavior: Behavior normal.  Assessment & Plan:

## 2019-12-09 DIAGNOSIS — J3081 Allergic rhinitis due to animal (cat) (dog) hair and dander: Secondary | ICD-10-CM | POA: Diagnosis not present

## 2019-12-09 DIAGNOSIS — J3089 Other allergic rhinitis: Secondary | ICD-10-CM | POA: Diagnosis not present

## 2019-12-22 DIAGNOSIS — Z23 Encounter for immunization: Secondary | ICD-10-CM | POA: Diagnosis not present

## 2019-12-30 DIAGNOSIS — J3089 Other allergic rhinitis: Secondary | ICD-10-CM | POA: Diagnosis not present

## 2020-01-12 ENCOUNTER — Ambulatory Visit: Payer: Medicare Other | Admitting: Dermatology

## 2020-01-12 DIAGNOSIS — H35371 Puckering of macula, right eye: Secondary | ICD-10-CM | POA: Diagnosis not present

## 2020-01-15 DIAGNOSIS — J3089 Other allergic rhinitis: Secondary | ICD-10-CM | POA: Diagnosis not present

## 2020-01-28 ENCOUNTER — Ambulatory Visit (INDEPENDENT_AMBULATORY_CARE_PROVIDER_SITE_OTHER): Payer: Medicare Other | Admitting: Dermatology

## 2020-01-28 ENCOUNTER — Other Ambulatory Visit: Payer: Self-pay

## 2020-01-28 DIAGNOSIS — L820 Inflamed seborrheic keratosis: Secondary | ICD-10-CM | POA: Diagnosis not present

## 2020-01-28 DIAGNOSIS — I6523 Occlusion and stenosis of bilateral carotid arteries: Secondary | ICD-10-CM | POA: Diagnosis not present

## 2020-01-28 DIAGNOSIS — L821 Other seborrheic keratosis: Secondary | ICD-10-CM

## 2020-01-28 DIAGNOSIS — L578 Other skin changes due to chronic exposure to nonionizing radiation: Secondary | ICD-10-CM

## 2020-01-28 NOTE — Progress Notes (Signed)
   Follow-Up Visit   Subjective  [name] is a 79 y.o. female who presents for the following: Seborrheic Keratosis (2 months f/u irritated itchy spots on the arms and trunk).  The following portions of the chart were reviewed this encounter and updated as appropriate:  Tobacco  Allergies  Meds  Problems  Med Hx  Surg Hx  Fam Hx     Review of Systems:  No other skin or systemic complaints except as noted in HPI or Assessment and Plan.  Objective  Well appearing patient in no apparent distress; mood and affect are within normal limits.  A focused examination was performed including face, arms, trunk, L leg. Relevant physical exam findings are noted in the Assessment and Plan.  Objective  Left Forearm, face, Right arm (16): Erythematous keratotic or waxy stuck-on papule or plaque.    Assessment & Plan    Actinic Damage - chronic, secondary to cumulative UV radiation exposure/sun exposure over time - diffuse scaly erythematous macules with underlying dyspigmentation - Recommend daily broad spectrum sunscreen SPF 30+ to sun-exposed areas, reapply every 2 hours as needed.  - Call for new or changing lesions.  Seborrheic Keratoses - Stuck-on, waxy, tan-brown papules and plaques  - Discussed benign etiology and prognosis. - Observe - Call for any changes  Inflamed seborrheic keratosis (16) Left Forearm, face, Right arm  If not gone on the L arm in 1 month Return to the office for removal   Destruction of lesion - Left Forearm, face, Right arm Complexity: simple   Destruction method: cryotherapy   Informed consent: discussed and consent obtained   Timeout:  patient name, date of birth, surgical site, and procedure verified Lesion destroyed using liquid nitrogen: Yes   Region frozen until ice ball extended beyond lesion: Yes   Outcome: patient tolerated procedure well with no complications   Post-procedure details: wound care instructions given   .Return in about  6 months (around 07/28/2020).  IMarye Round, CMA, am acting as scribe for Sarina Ser, MD .  Documentation: I have reviewed the above documentation for accuracy and completeness, and I agree with the above.  Sarina Ser, MD

## 2020-01-28 NOTE — Patient Instructions (Signed)
Cryotherapy Aftercare  . Wash gently with soap and water everyday.   . Apply Vaseline and Band-Aid daily until healed.  

## 2020-02-02 ENCOUNTER — Encounter: Payer: Self-pay | Admitting: Dermatology

## 2020-02-12 DIAGNOSIS — J3089 Other allergic rhinitis: Secondary | ICD-10-CM | POA: Diagnosis not present

## 2020-03-04 DIAGNOSIS — J3089 Other allergic rhinitis: Secondary | ICD-10-CM | POA: Diagnosis not present

## 2020-03-16 ENCOUNTER — Ambulatory Visit: Payer: Medicare Other | Admitting: Dermatology

## 2020-03-23 DIAGNOSIS — J3089 Other allergic rhinitis: Secondary | ICD-10-CM | POA: Diagnosis not present

## 2020-03-23 DIAGNOSIS — J301 Allergic rhinitis due to pollen: Secondary | ICD-10-CM | POA: Diagnosis not present

## 2020-04-13 DIAGNOSIS — J301 Allergic rhinitis due to pollen: Secondary | ICD-10-CM | POA: Diagnosis not present

## 2020-04-13 DIAGNOSIS — J3089 Other allergic rhinitis: Secondary | ICD-10-CM | POA: Diagnosis not present

## 2020-04-26 DIAGNOSIS — I6523 Occlusion and stenosis of bilateral carotid arteries: Secondary | ICD-10-CM | POA: Diagnosis not present

## 2020-04-26 DIAGNOSIS — E782 Mixed hyperlipidemia: Secondary | ICD-10-CM | POA: Diagnosis not present

## 2020-04-26 DIAGNOSIS — I340 Nonrheumatic mitral (valve) insufficiency: Secondary | ICD-10-CM | POA: Diagnosis not present

## 2020-04-26 DIAGNOSIS — I480 Paroxysmal atrial fibrillation: Secondary | ICD-10-CM | POA: Diagnosis not present

## 2020-04-26 DIAGNOSIS — R0602 Shortness of breath: Secondary | ICD-10-CM | POA: Diagnosis not present

## 2020-05-04 DIAGNOSIS — J3089 Other allergic rhinitis: Secondary | ICD-10-CM | POA: Diagnosis not present

## 2020-05-04 DIAGNOSIS — J301 Allergic rhinitis due to pollen: Secondary | ICD-10-CM | POA: Diagnosis not present

## 2020-05-25 DIAGNOSIS — J3089 Other allergic rhinitis: Secondary | ICD-10-CM | POA: Diagnosis not present

## 2020-05-27 DIAGNOSIS — E782 Mixed hyperlipidemia: Secondary | ICD-10-CM | POA: Diagnosis not present

## 2020-05-27 DIAGNOSIS — I6523 Occlusion and stenosis of bilateral carotid arteries: Secondary | ICD-10-CM | POA: Diagnosis not present

## 2020-05-27 DIAGNOSIS — I480 Paroxysmal atrial fibrillation: Secondary | ICD-10-CM | POA: Diagnosis not present

## 2020-05-27 DIAGNOSIS — I340 Nonrheumatic mitral (valve) insufficiency: Secondary | ICD-10-CM | POA: Diagnosis not present

## 2020-06-20 DIAGNOSIS — J3089 Other allergic rhinitis: Secondary | ICD-10-CM | POA: Diagnosis not present

## 2020-06-29 DIAGNOSIS — J3089 Other allergic rhinitis: Secondary | ICD-10-CM | POA: Diagnosis not present

## 2020-07-06 DIAGNOSIS — J3089 Other allergic rhinitis: Secondary | ICD-10-CM | POA: Diagnosis not present

## 2020-07-19 ENCOUNTER — Other Ambulatory Visit: Payer: Self-pay | Admitting: Internal Medicine

## 2020-07-21 DIAGNOSIS — H35371 Puckering of macula, right eye: Secondary | ICD-10-CM | POA: Diagnosis not present

## 2020-07-21 DIAGNOSIS — H18513 Endothelial corneal dystrophy, bilateral: Secondary | ICD-10-CM | POA: Diagnosis not present

## 2020-07-27 DIAGNOSIS — J3089 Other allergic rhinitis: Secondary | ICD-10-CM | POA: Diagnosis not present

## 2020-08-05 ENCOUNTER — Ambulatory Visit (INDEPENDENT_AMBULATORY_CARE_PROVIDER_SITE_OTHER): Payer: Medicare Other | Admitting: Dermatology

## 2020-08-05 ENCOUNTER — Other Ambulatory Visit: Payer: Self-pay

## 2020-08-05 DIAGNOSIS — D485 Neoplasm of uncertain behavior of skin: Secondary | ICD-10-CM

## 2020-08-05 DIAGNOSIS — C44729 Squamous cell carcinoma of skin of left lower limb, including hip: Secondary | ICD-10-CM

## 2020-08-05 DIAGNOSIS — L578 Other skin changes due to chronic exposure to nonionizing radiation: Secondary | ICD-10-CM | POA: Diagnosis not present

## 2020-08-05 DIAGNOSIS — L821 Other seborrheic keratosis: Secondary | ICD-10-CM

## 2020-08-05 DIAGNOSIS — C4492 Squamous cell carcinoma of skin, unspecified: Secondary | ICD-10-CM

## 2020-08-05 DIAGNOSIS — L820 Inflamed seborrheic keratosis: Secondary | ICD-10-CM

## 2020-08-05 HISTORY — DX: Squamous cell carcinoma of skin, unspecified: C44.92

## 2020-08-05 NOTE — Progress Notes (Signed)
Follow-Up Visit   Subjective  [name] is a 80 y.o. female who presents for the following: Follow-up (F/u to recheck ISKs that were treated with Ln2 at last visit. Pt has a spots on her left lower leg, right arm, and under eyebrows to check today. ).  The following portions of the chart were reviewed this encounter and updated as appropriate:  Tobacco  Allergies  Meds  Problems  Med Hx  Surg Hx  Fam Hx      Review of Systems: No other skin or systemic complaints except as noted in HPI or Assessment and Plan.  Objective  Well appearing patient in no apparent distress; mood and affect are within normal limits.  A focused examination was performed including face, arms, legs. Relevant physical exam findings are noted in the Assessment and Plan.  left pretibia 1 cm hyperkeratotic papule   arms and hands x 15, brows x 5, left tricep x 1 (21) Erythematous keratotic or waxy stuck-on papule or plaque.   Assessment & Plan  Neoplasm of uncertain behavior of skin left pretibia  Epidermal / dermal shaving  Lesion diameter (cm):  1 Informed consent: discussed and consent obtained   Timeout: patient name, date of birth, surgical site, and procedure verified   Procedure prep:  Patient was prepped and draped in usual sterile fashion Prep type:  Isopropyl alcohol Anesthesia: the lesion was anesthetized in a standard fashion   Anesthetic:  1% lidocaine w/ epinephrine 1-100,000 buffered w/ 8.4% NaHCO3 Instrument used: flexible razor blade   Hemostasis achieved with: pressure, aluminum chloride and electrodesiccation   Outcome: patient tolerated procedure well   Post-procedure details: sterile dressing applied and wound care instructions given   Dressing type: bandage and petrolatum    Destruction of lesion Complexity: extensive   Destruction method: electrodesiccation and curettage   Informed consent: discussed and consent obtained   Timeout:  patient name, date of birth,  surgical site, and procedure verified Procedure prep:  Patient was prepped and draped in usual sterile fashion Prep type:  Isopropyl alcohol Anesthesia: the lesion was anesthetized in a standard fashion   Anesthetic:  1% lidocaine w/ epinephrine 1-100,000 buffered w/ 8.4% NaHCO3 Curettage performed in three different directions: Yes   Electrodesiccation performed over the curetted area: Yes   Lesion length (cm):  1 Lesion width (cm):  1 Margin per side (cm):  0.2 Final wound size (cm):  1.4 Hemostasis achieved with:  pressure and aluminum chloride Outcome: patient tolerated procedure well with no complications   Post-procedure details: sterile dressing applied and wound care instructions given   Dressing type: bandage and petrolatum    Specimen 1 - Surgical pathology Differential Diagnosis: R/o SCC  Check Margins: Yes   Inflamed seborrheic keratosis arms and hands x 15, brows x 5, left tricep x 1  Prior to procedure, discussed risks of blister formation, small wound, skin dyspigmentation, or rare scar following cryotherapy.    Destruction of lesion - arms and hands x 15, brows x 5, left tricep x 1 Complexity: simple   Destruction method: cryotherapy   Informed consent: discussed and consent obtained   Timeout:  patient name, date of birth, surgical site, and procedure verified Lesion destroyed using liquid nitrogen: Yes   Region frozen until ice ball extended beyond lesion: Yes   Outcome: patient tolerated procedure well with no complications   Post-procedure details: wound care instructions given    SCC (squamous cell carcinoma), leg, left  Actinic Damage - chronic, secondary  to cumulative UV radiation exposure/sun exposure over time - diffuse scaly erythematous macules with underlying dyspigmentation - Recommend daily broad spectrum sunscreen SPF 30+ to sun-exposed areas, reapply every 2 hours as needed.  - Recommend staying in the shade or wearing long sleeves, sun  glasses (UVA+UVB protection) and wide brim hats (4-inch brim around the entire circumference of the hat). - Call for new or changing lesions.  Seborrheic Keratoses - Stuck-on, waxy, tan-brown papules and/or plaques  - Benign-appearing - Discussed benign etiology and prognosis. - Observe - Call for any changes  Return in about 6 months (around 02/04/2021) for 6 mo- 1 year check.  IHarriett Sine, CMA, am acting as scribe for Sarina Ser, MD.  Documentation: I have reviewed the above documentation for accuracy and completeness, and I agree with the above.  Sarina Ser, MD

## 2020-08-05 NOTE — Patient Instructions (Signed)
Wound Care Instructions  Cleanse wound gently with soap and water once a day then pat dry with clean gauze. Apply a thing coat of Petrolatum (petroleum jelly, "Vaseline") over the wound (unless you have an allergy to this). We recommend that you use a new, sterile tube of Vaseline. Do not pick or remove scabs. Do not remove the yellow or white "healing tissue" from the base of the wound.  Cover the wound with fresh, clean, nonstick gauze and secure with paper tape. You may use Band-Aids in place of gauze and tape if the would is small enough, but would recommend trimming much of the tape off as there is often too much. Sometimes Band-Aids can irritate the skin.  You should call the office for your biopsy report after 1 week if you have not already been contacted.  If you experience any problems, such as abnormal amounts of bleeding, swelling, significant bruising, significant pain, or evidence of infection, please call the office immediately.  FOR ADULT SURGERY PATIENTS: If you need something for pain relief you may take 1 extra strength Tylenol (acetaminophen) AND 2 Ibuprofen (200mg each) together every 4 hours as needed for pain. (do not take these if you are allergic to them or if you have a reason you should not take them.) Typically, you may only need pain medication for 1 to 3 days.     Electrodesiccation and Curettage ("Scrape and Burn") Wound Care Instructions  Leave the original bandage on for 24 hours if possible.  If the bandage becomes soaked or soiled before that time, it is OK to remove it and examine the wound.  A small amount of post-operative bleeding is normal.  If excessive bleeding occurs, remove the bandage, place gauze over the site and apply continuous pressure (no peeking) over the area for 30 minutes. If this does not work, please call our clinic as soon as possible or page your doctor if it is after hours.   Once a day, cleanse the wound with soap and water. It is fine to  shower. If a thick crust develops you may use a Q-tip dipped into dilute hydrogen peroxide (mix 1:1 with water) to dissolve it.  Hydrogen peroxide can slow the healing process, so use it only as needed.    After washing, apply petroleum jelly (Vaseline) or an antibiotic ointment if your doctor prescribed one for you, followed by a bandage.    For best healing, the wound should be covered with a layer of ointment at all times. If you are not able to keep the area covered with a bandage to hold the ointment in place, this may mean re-applying the ointment several times a day.  Continue this wound care until the wound has healed and is no longer open. It may take several weeks for the wound to heal and close.  Itching and mild discomfort is normal during the healing process.  If you have any discomfort, you can take Tylenol (acetaminophen) or ibuprofen as directed on the bottle. (Please do not take these if you have an allergy to them or cannot take them for another reason).  Some redness, tenderness and white or yellow material in the wound is normal healing.  If the area becomes very sore and red, or develops a thick yellow-green material (pus), it may be infected; please notify us.    Wound healing continues for up to one year following surgery. It is not unusual to experience pain in the scar from time to   time during the interval.  If the pain becomes severe or the scar thickens, you should notify the office.    A slight amount of redness in a scar is expected for the first six months.  After six months, the redness will fade and the scar will soften and fade.  The color difference becomes less noticeable with time.  If there are any problems, return for a post-op surgery check at your earliest convenience.  To improve the appearance of the scar, you can use silicone scar gel, cream, or sheets (such as Mederma or Serica) every night for up to one year. These are available over the counter (without a  prescription).  Please call our office at [phone] for any questions or concerns. 

## 2020-08-10 ENCOUNTER — Encounter: Payer: Self-pay | Admitting: Dermatology

## 2020-08-11 ENCOUNTER — Telehealth: Payer: Self-pay

## 2020-08-11 NOTE — Telephone Encounter (Signed)
Advised patient of results.  

## 2020-08-11 NOTE — Telephone Encounter (Signed)
Spoke with pt and informed her of results. She had no concerns.

## 2020-08-11 NOTE — Progress Notes (Deleted)
Complete/cw

## 2020-08-17 DIAGNOSIS — J3089 Other allergic rhinitis: Secondary | ICD-10-CM | POA: Diagnosis not present

## 2020-09-07 DIAGNOSIS — J3089 Other allergic rhinitis: Secondary | ICD-10-CM | POA: Diagnosis not present

## 2020-09-08 DIAGNOSIS — Z01419 Encounter for gynecological examination (general) (routine) without abnormal findings: Secondary | ICD-10-CM | POA: Diagnosis not present

## 2020-09-08 DIAGNOSIS — Z1231 Encounter for screening mammogram for malignant neoplasm of breast: Secondary | ICD-10-CM | POA: Diagnosis not present

## 2020-09-09 DIAGNOSIS — J3089 Other allergic rhinitis: Secondary | ICD-10-CM | POA: Diagnosis not present

## 2020-09-15 DIAGNOSIS — H903 Sensorineural hearing loss, bilateral: Secondary | ICD-10-CM | POA: Diagnosis not present

## 2020-09-15 DIAGNOSIS — H6123 Impacted cerumen, bilateral: Secondary | ICD-10-CM | POA: Diagnosis not present

## 2020-09-16 DIAGNOSIS — J3089 Other allergic rhinitis: Secondary | ICD-10-CM | POA: Diagnosis not present

## 2020-10-07 DIAGNOSIS — J3089 Other allergic rhinitis: Secondary | ICD-10-CM | POA: Diagnosis not present

## 2020-10-12 ENCOUNTER — Other Ambulatory Visit: Payer: Self-pay | Admitting: Internal Medicine

## 2020-10-28 DIAGNOSIS — J3089 Other allergic rhinitis: Secondary | ICD-10-CM | POA: Diagnosis not present

## 2020-11-18 DIAGNOSIS — J3089 Other allergic rhinitis: Secondary | ICD-10-CM | POA: Diagnosis not present

## 2020-11-24 DIAGNOSIS — I480 Paroxysmal atrial fibrillation: Secondary | ICD-10-CM | POA: Diagnosis not present

## 2020-11-24 DIAGNOSIS — I340 Nonrheumatic mitral (valve) insufficiency: Secondary | ICD-10-CM | POA: Diagnosis not present

## 2020-11-24 DIAGNOSIS — E782 Mixed hyperlipidemia: Secondary | ICD-10-CM | POA: Diagnosis not present

## 2020-12-01 DIAGNOSIS — I480 Paroxysmal atrial fibrillation: Secondary | ICD-10-CM | POA: Diagnosis not present

## 2020-12-01 DIAGNOSIS — I6523 Occlusion and stenosis of bilateral carotid arteries: Secondary | ICD-10-CM | POA: Diagnosis not present

## 2020-12-01 DIAGNOSIS — E782 Mixed hyperlipidemia: Secondary | ICD-10-CM | POA: Diagnosis not present

## 2020-12-01 DIAGNOSIS — I340 Nonrheumatic mitral (valve) insufficiency: Secondary | ICD-10-CM | POA: Diagnosis not present

## 2020-12-03 ENCOUNTER — Encounter: Payer: Self-pay | Admitting: Internal Medicine

## 2020-12-03 ENCOUNTER — Ambulatory Visit (INDEPENDENT_AMBULATORY_CARE_PROVIDER_SITE_OTHER): Payer: Medicare Other | Admitting: Internal Medicine

## 2020-12-03 ENCOUNTER — Other Ambulatory Visit: Payer: Self-pay

## 2020-12-03 VITALS — BP 140/84 | HR 85 | Temp 97.2°F | Ht 63.0 in | Wt 127.0 lb

## 2020-12-03 DIAGNOSIS — I4819 Other persistent atrial fibrillation: Secondary | ICD-10-CM | POA: Diagnosis not present

## 2020-12-03 DIAGNOSIS — Z0000 Encounter for general adult medical examination without abnormal findings: Secondary | ICD-10-CM | POA: Diagnosis not present

## 2020-12-03 DIAGNOSIS — Z23 Encounter for immunization: Secondary | ICD-10-CM | POA: Diagnosis not present

## 2020-12-03 DIAGNOSIS — F39 Unspecified mood [affective] disorder: Secondary | ICD-10-CM | POA: Diagnosis not present

## 2020-12-03 DIAGNOSIS — I6523 Occlusion and stenosis of bilateral carotid arteries: Secondary | ICD-10-CM

## 2020-12-03 DIAGNOSIS — K219 Gastro-esophageal reflux disease without esophagitis: Secondary | ICD-10-CM

## 2020-12-03 DIAGNOSIS — I10 Essential (primary) hypertension: Secondary | ICD-10-CM

## 2020-12-03 LAB — COMPREHENSIVE METABOLIC PANEL
ALT: 20 U/L (ref 0–35)
AST: 21 U/L (ref 0–37)
Albumin: 4.4 g/dL (ref 3.5–5.2)
Alkaline Phosphatase: 55 U/L (ref 39–117)
BUN: 20 mg/dL (ref 6–23)
CO2: 29 mEq/L (ref 19–32)
Calcium: 8.9 mg/dL (ref 8.4–10.5)
Chloride: 103 mEq/L (ref 96–112)
Creatinine, Ser: 0.79 mg/dL (ref 0.40–1.20)
GFR: 70.08 mL/min (ref >60.00)
Glucose, Bld: 113 mg/dL — ABNORMAL HIGH (ref 70–99)
Potassium: 4.3 mEq/L (ref 3.5–5.1)
Sodium: 140 mEq/L (ref 135–145)
Total Bilirubin: 1.5 mg/dL — ABNORMAL HIGH (ref 0.2–1.2)
Total Protein: 6.8 g/dL (ref 6.0–8.3)

## 2020-12-03 LAB — LIPID PANEL
Cholesterol: 121 mg/dL (ref 0–200)
HDL: 42.7 mg/dL (ref >39.00)
LDL Cholesterol: 60 mg/dL (ref 0–99)
NonHDL: 78.61
Total CHOL/HDL Ratio: 3
Triglycerides: 95 mg/dL (ref 0.0–149.0)
VLDL: 19 mg/dL (ref 0.0–40.0)

## 2020-12-03 LAB — CBC
HCT: 32.1 % — ABNORMAL LOW (ref 36.0–46.0)
Hemoglobin: 10.6 g/dL — ABNORMAL LOW (ref 12.0–15.0)
MCHC: 33.1 g/dL (ref 30.0–36.0)
MCV: 99.5 fl (ref 78.0–100.0)
Platelets: 209 10*3/uL (ref 150.0–400.0)
RBC: 3.23 Mil/uL — ABNORMAL LOW (ref 3.87–5.11)
RDW: 17.9 % — ABNORMAL HIGH (ref 11.5–15.5)
WBC: 3.9 10*3/uL — ABNORMAL LOW (ref 4.0–10.5)

## 2020-12-03 LAB — TSH: TSH: 6.74 u[IU]/mL — ABNORMAL HIGH (ref 0.35–5.50)

## 2020-12-03 LAB — T4, FREE: Free T4: 1.13 ng/dL (ref 0.60–1.60)

## 2020-12-03 NOTE — Progress Notes (Signed)
Subjective:    Patient ID: [name], female    DOB: 1941/01/23, 80 y.o.   MRN: [id number]  HPI Here with daughter [name] for Medicare wellness and follow up of chronic health conditions This visit occurred during the SARS-CoV-2 public health emergency.  Safety protocols were in place, including screening questions prior to the visit, additional usage of staff PPE, and extensive cleaning of exam room while observing appropriate contact time as indicated for disinfecting solutions.   Reviewed advanced directives Reviewed other doctors--Dr Pearla Dubonnet, Dr Leary Roca, Dr Whelan--allergist, Dr [name] Ina, [facility] Only surgery was skin cancer. No hospitalizations in past year Walks a little--not really exercising regularly Rare alcohol drink. No tobacco Vision is okay Has hearing aides-they help No falls Some chronic mood issues Independent with instrumental ADLs No apparent memory issues  Having issues with the irregular heart Seeing cardiology---now getting echo again On amidarone bid for now--should be weaned Was on metoprolol--but stopped due to bradycardia No chest pain--but felt heavy on her chest one day after starting amio Breathing is okay-but daughter notes voice weaker. Does get sense of having to stop to catch breath at times No edema No dizziness or syncope  Not really depressed Very anxious about BP and heart issues now Still on the sertraline  On pepcid prn for GERD No dysphagia  Current Outpatient Medications on File Prior to Visit  Medication Sig Dispense Refill   amiodarone (PACERONE) 200 MG tablet Take 200 mg by mouth 2 (two) times daily.     apixaban (ELIQUIS) 2.5 MG TABS tablet Take 2.5 mg by mouth 2 (two) times daily.     azelastine (ASTELIN) 0.1 % nasal spray Place 1 spray into both nostrils 2 (two) times daily. Use in each nostril as directed 30 mL 5   EPINEPHrine 0.3 mg/0.3 mL IJ SOAJ injection Inject 0.3 mg into the muscle as needed  for anaphylaxis.     famotidine (PEPCID) 20 MG tablet Take 20 mg by mouth daily.     glucosamine-chondroitin 500-400 MG tablet Take 1 tablet by mouth 2 (two) times daily. 1 pm     loratadine (CLARITIN) 10 MG tablet Take 10 mg by mouth daily.     Multiple Vitamin (MULTIVITAMIN) capsule Take 1 capsule by mouth daily.     Omega-3 Fatty Acids (FISH OIL PO) Take 1 tablet by mouth daily.     pravastatin (PRAVACHOL) 40 MG tablet TAKE 1 TABLET BY MOUTH EVERYDAY AT BEDTIME 90 tablet 3   sertraline (ZOLOFT) 25 MG tablet TAKE 1 TABLET BY MOUTH EVERY DAY 90 tablet 0   vitamin B-12 (CYANOCOBALAMIN) 500 MCG tablet Take 500 mcg by mouth daily.     No current facility-administered medications on file prior to visit.    Allergies  Allergen Reactions   Amlodipine Besy-Benazepril Hcl Swelling    Lotrel caused swelling of feet & facial swelling unilaterally   Lotrel [Amlodipine Besy-Benazepril Hcl]    Molds & Smuts Other (See Comments)   Wasp Venom Swelling    Allergic to wasps    Past Medical History:  Diagnosis Date   Adenomatous colon polyp 2005   Allergy    perennial ; shots from Dr Velora Heckler   Aortic insufficiency    Hyperlipidemia    Hypertension    Murmur    a.  04/2011 Echo:  EF 55-60%, mild-mod ai/mr   Squamous cell carcinoma of skin 08/05/2020   left pretibia [facility]   Uterine cancer [facility])     Past Surgical History:  Procedure Laterality Date   BLADDER SUSPENSION     CARPAL TUNNEL RELEASE Left    COLONOSCOPY  multiple   GLAUCOMA SURGERY     "for narrow lines" (? narrow angle?)   INTRAOCULAR LENS INSERTION Bilateral 10/22/2014; 11/26/2014   TOTAL ABDOMINAL HYSTERECTOMY W/ BILATERAL SALPINGOOPHORECTOMY     abnormal PAP    Family History  Problem Relation Age of Onset   Stroke Mother 30   Heart failure Mother    Heart attack Father 22   Lung cancer Father    Cancer Maternal Aunt         X 2; Gyn & ? 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  Tobacco Use   Smoking status: Never   Smokeless tobacco: Never  Vaping Use   Vaping Use: Never used  Substance and Sexual Activity   Alcohol use: Yes    Comment: special events   Drug use: No   Sexual activity: Never  Other Topics Concern   Not on file  Social History Narrative   Widowed 12/19      Has living will   Children now would be health care POA   Would accept resuscitation but no prolonged ventilation   No tube feeds if cognitively unaware      2 daughters, 4 grandsons 1 great-grandson   Retired Medical sales representative from [employer] Strain: Not on [employer] Insecurity: Not on file  Transportation Needs: Not on file  Physical Activity: Not on file  Stress: Not on file  Social Connections: Not on file  Intimate Partner Violence: Not on file   Review of Systems Appetite is okay Weight is stable Sleeps fine---some trouble initiating (reading will help) Wears seat belt Teeth okay--keeps up with dentist Bowels move fine--no blood Some urge incontinence---wears pad Has some neck pain--tylenol will help some/OTC voltaren cream. Fingers stiff at times    Objective:   Physical Exam Constitutional:      Appearance: Normal appearance.  HENT:     Mouth/Throat:     Comments: No lesions Eyes:     Conjunctiva/sclera: Conjunctivae normal.     Pupils: Pupils are equal, round, and reactive to light.  Cardiovascular:     Rate and Rhythm: Normal rate. Rhythm irregular.     Pulses: Normal pulses.     Heart sounds: No murmur heard.   No gallop.  Pulmonary:     Effort: Pulmonary  effort is normal.     Breath sounds: Normal breath sounds. No wheezing or rales.  Abdominal:     Palpations: Abdomen is soft.     Tenderness: There is no abdominal tenderness.  Musculoskeletal:     Cervical back: Neck supple.     Right lower leg: No edema.     Left lower leg: No edema.  Lymphadenopathy:     Cervical: No cervical adenopathy.  Skin:    Findings: No lesion or rash.  Neurological:  Mental Status: She is alert and oriented to person, place, and time.     Comments: President---"Biden, Trump, Obama" [id number] Recall 3/3  Psychiatric:        Mood and Affect: Mood normal.        Behavior: Behavior normal.           Assessment & Plan:

## 2020-12-03 NOTE — Progress Notes (Signed)
Hearing Screening - Comments:: Has hearing aids. Wearing them today. Vision Screening - Comments:: July 2022

## 2020-12-03 NOTE — Assessment & Plan Note (Signed)
Okay with prn famotidine

## 2020-12-03 NOTE — Assessment & Plan Note (Signed)
Some anxiety with medical issues Depression is better Continue the low dose sertraline for now

## 2020-12-03 NOTE — Assessment & Plan Note (Signed)
I have personally reviewed the Medicare Annual Wellness questionnaire and have noted 1. The patient's medical and social history 2. Their use of alcohol, tobacco or illicit drugs 3. Their current medications and supplements 4. The patient's functional ability including ADL's, fall risks, home safety risks and hearing or visual             impairment. 5. Diet and physical activities 6. Evidence for depression or mood disorders  The patients weight, height, BMI and visual acuity have been recorded in the chart I have made referrals, counseling and provided education to the patient based review of the above and I have provided the pt with a written personalized care plan for preventive services.  I have provided you with a copy of your personalized plan for preventive services. Please take the time to review along with your updated medication list.  No cancer screening due to age Flu vaccine today Bivalent COVID vaccine soon Discussed increasing exercise Consider shingrix when covered

## 2020-12-03 NOTE — Assessment & Plan Note (Signed)
Is on statin and eliquis

## 2020-12-03 NOTE — Assessment & Plan Note (Signed)
BP Readings from Last 3 Encounters:  12/03/20 140/84  12/03/19 110/60  09/11/19 (!) 148/82   Had been up with her nerves--better today Didn't do well with metoprolol--bradycardia

## 2020-12-03 NOTE — Assessment & Plan Note (Signed)
Now persistent On amiodarone --may be best to wean off Echo being checked soon Is on the eliquis

## 2020-12-09 DIAGNOSIS — J3089 Other allergic rhinitis: Secondary | ICD-10-CM | POA: Diagnosis not present

## 2020-12-09 DIAGNOSIS — J301 Allergic rhinitis due to pollen: Secondary | ICD-10-CM | POA: Diagnosis not present

## 2020-12-14 DIAGNOSIS — I480 Paroxysmal atrial fibrillation: Secondary | ICD-10-CM | POA: Diagnosis not present

## 2020-12-16 ENCOUNTER — Other Ambulatory Visit (INDEPENDENT_AMBULATORY_CARE_PROVIDER_SITE_OTHER): Payer: Medicare Other

## 2020-12-16 DIAGNOSIS — Z1211 Encounter for screening for malignant neoplasm of colon: Secondary | ICD-10-CM

## 2020-12-16 LAB — FECAL OCCULT BLOOD, IMMUNOCHEMICAL: Fecal Occult Bld: NEGATIVE

## 2020-12-22 DIAGNOSIS — I779 Disorder of arteries and arterioles, unspecified: Secondary | ICD-10-CM | POA: Diagnosis not present

## 2020-12-22 DIAGNOSIS — E782 Mixed hyperlipidemia: Secondary | ICD-10-CM | POA: Diagnosis not present

## 2020-12-22 DIAGNOSIS — I6523 Occlusion and stenosis of bilateral carotid arteries: Secondary | ICD-10-CM | POA: Diagnosis not present

## 2020-12-22 DIAGNOSIS — I340 Nonrheumatic mitral (valve) insufficiency: Secondary | ICD-10-CM | POA: Diagnosis not present

## 2020-12-22 DIAGNOSIS — I480 Paroxysmal atrial fibrillation: Secondary | ICD-10-CM | POA: Diagnosis not present

## 2020-12-30 DIAGNOSIS — J3089 Other allergic rhinitis: Secondary | ICD-10-CM | POA: Diagnosis not present

## 2021-01-08 ENCOUNTER — Other Ambulatory Visit: Payer: Self-pay | Admitting: Internal Medicine

## 2021-01-18 DIAGNOSIS — J3089 Other allergic rhinitis: Secondary | ICD-10-CM | POA: Diagnosis not present

## 2021-01-22 IMAGING — CT CT HEAD W/O CM
3 series · 15 of 47 positions shown, 18 images · non-contrast
Comparison: None.

CLINICAL DATA: Syncopal episode

EXAM:
CT HEAD WITHOUT CONTRAST
TECHNIQUE: Contiguous axial images were obtained from the base of the skull
through the vertex without intravenous contrast.

[Series 2: head wo · axial · 0.44mm/px · z∈[-148,-23]mm · 9 of 30 slices shown, 12 images]
[im 3/30  brain]
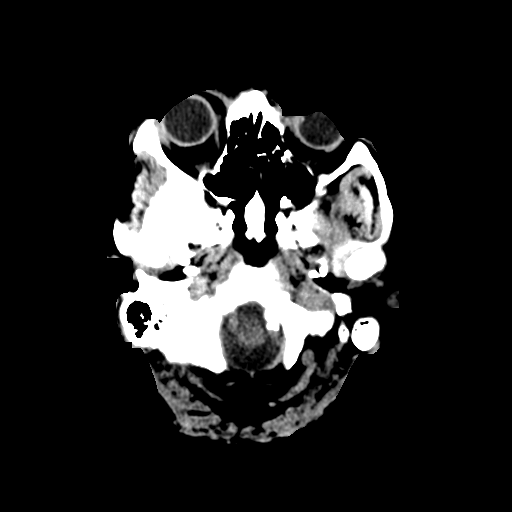
[im 3/30  bone]
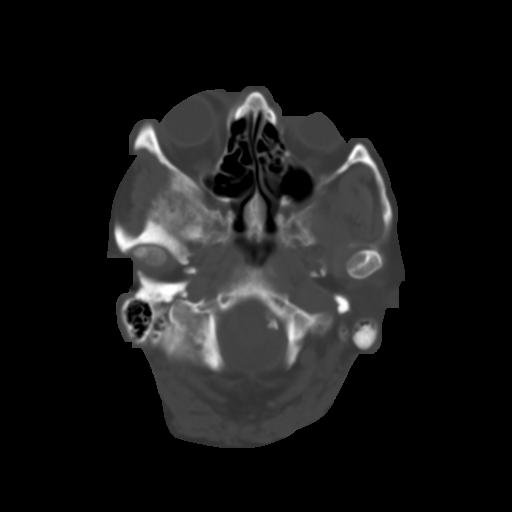
[im 6/30  brain]
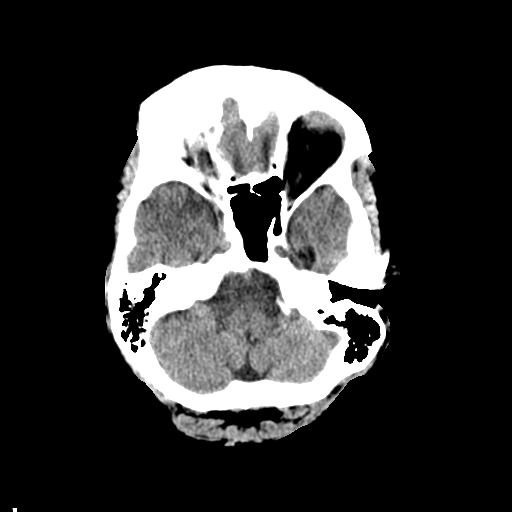
[im 9/30  brain]
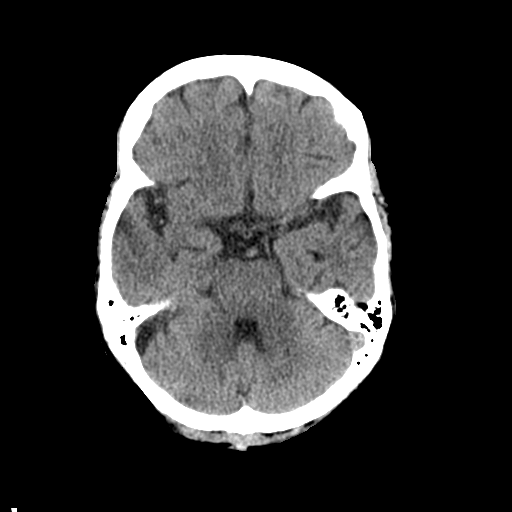
[im 12/30  brain]
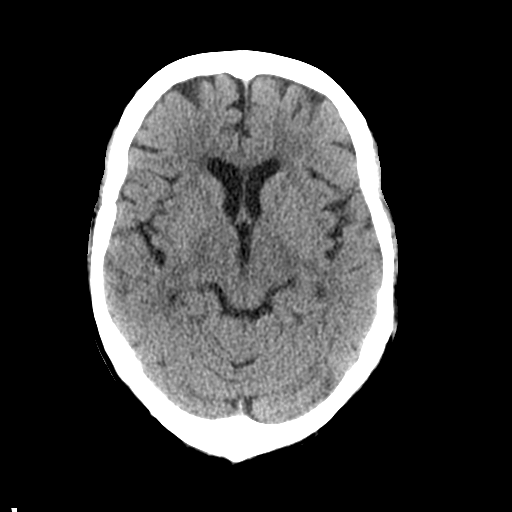
[im 16/30  brain]
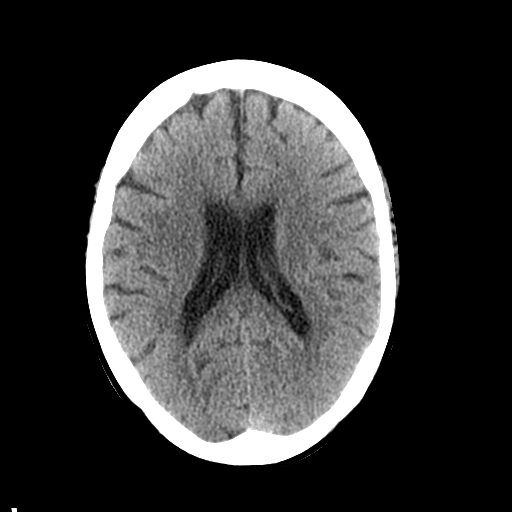
[im 16/30  bone]
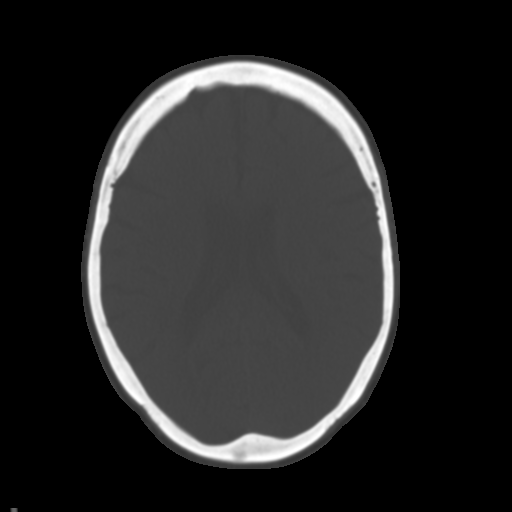
[im 19/30  brain]
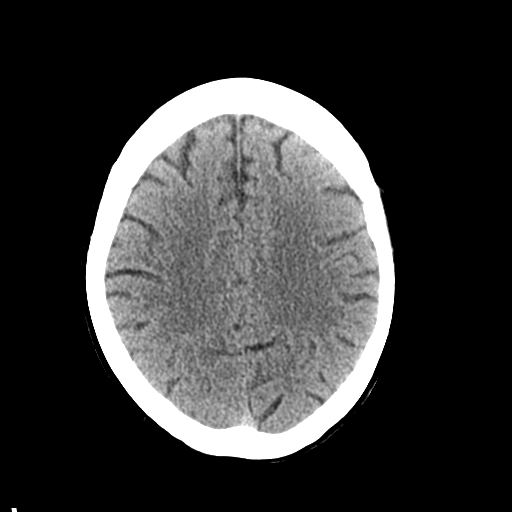
[im 22/30  brain]
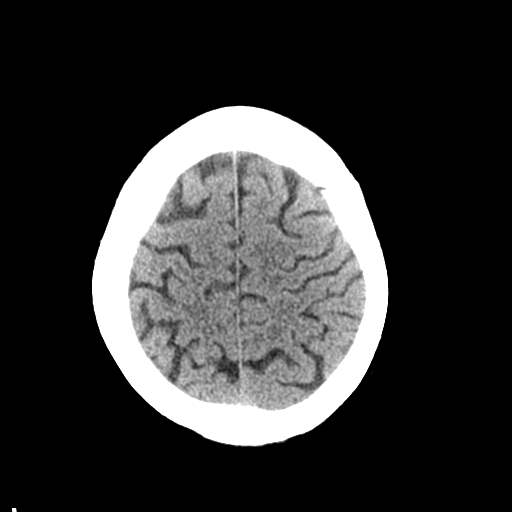
[im 25/30  brain]
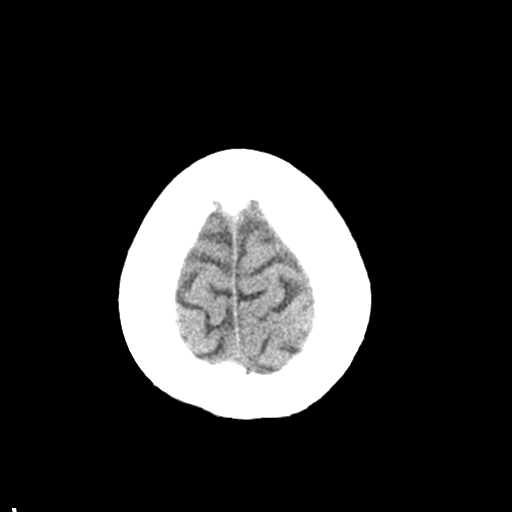
[im 28/30  brain]
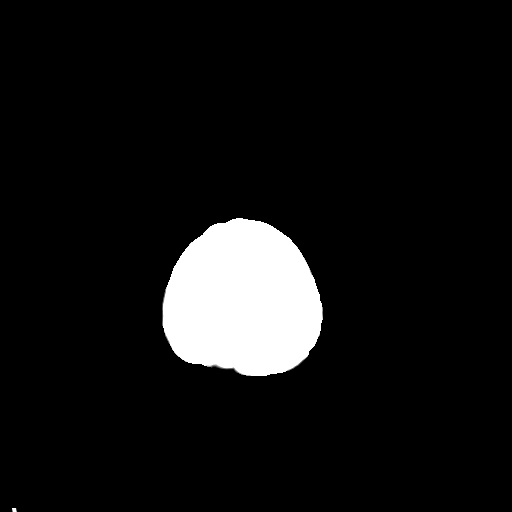
[im 28/30  bone]
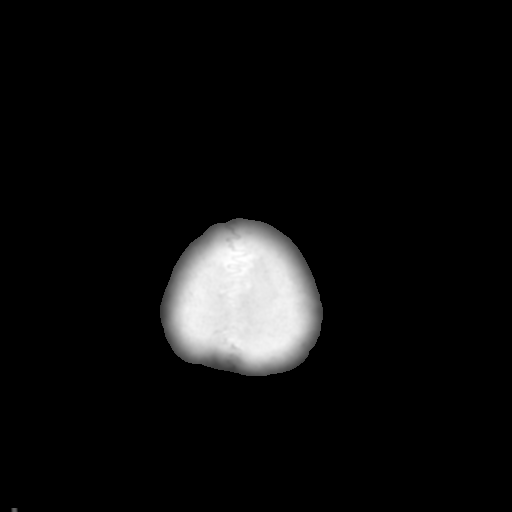

[Series 4: coronal soft tissue · coronal · 0.29mm/px · 3 of 59 slices shown]
[im 20/59  brain]
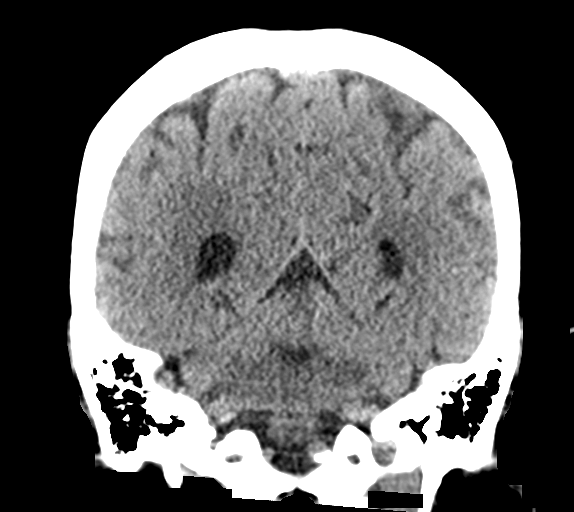
[im 26/59  brain]
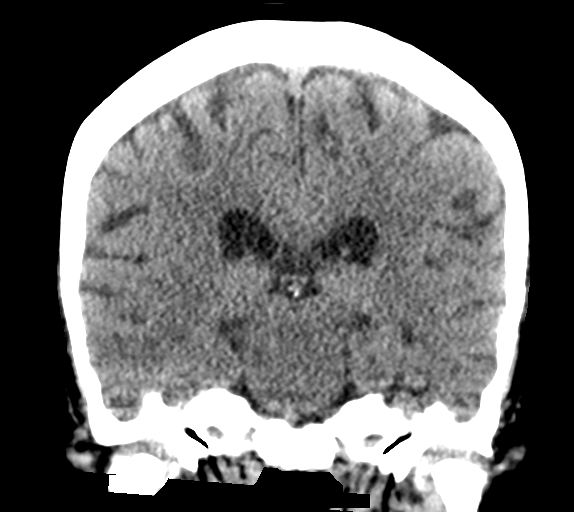
[im 33/59  brain]
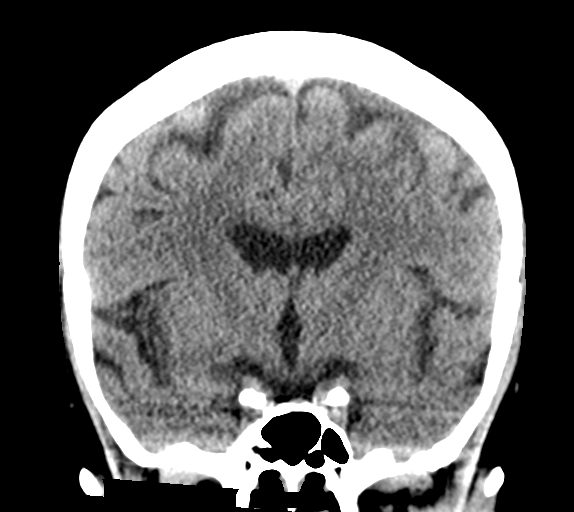

[Series 5: sagittal soft tissue · sagittal · 0.31mm/px · 3 of 47 slices shown]
[im 16/47  brain]
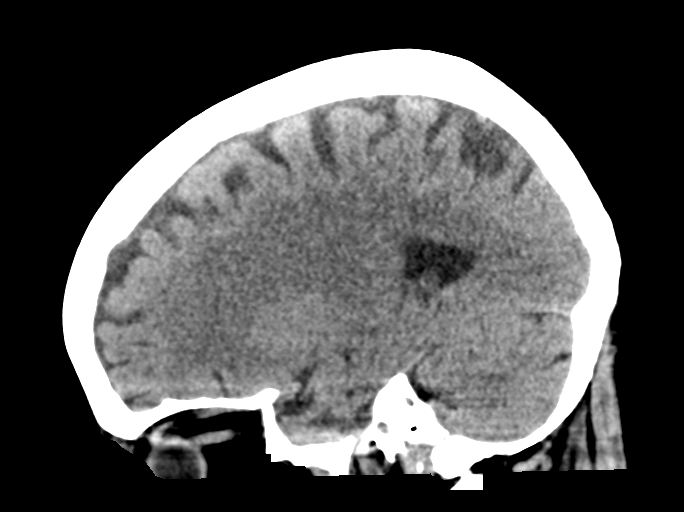
[im 24/47  brain]
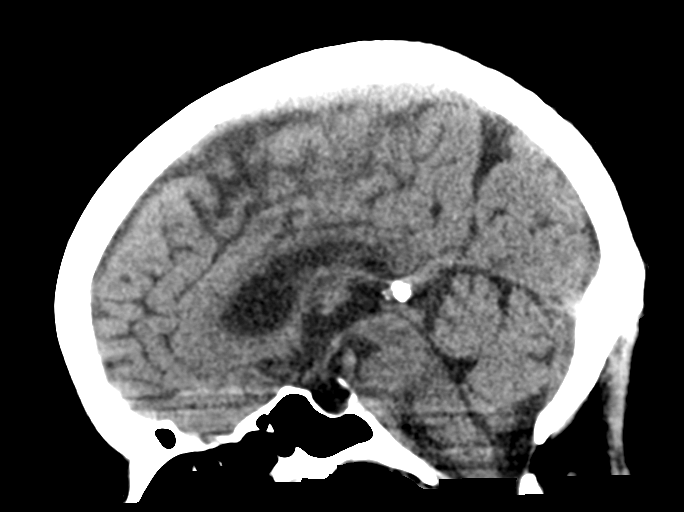
[im 31/47  brain]
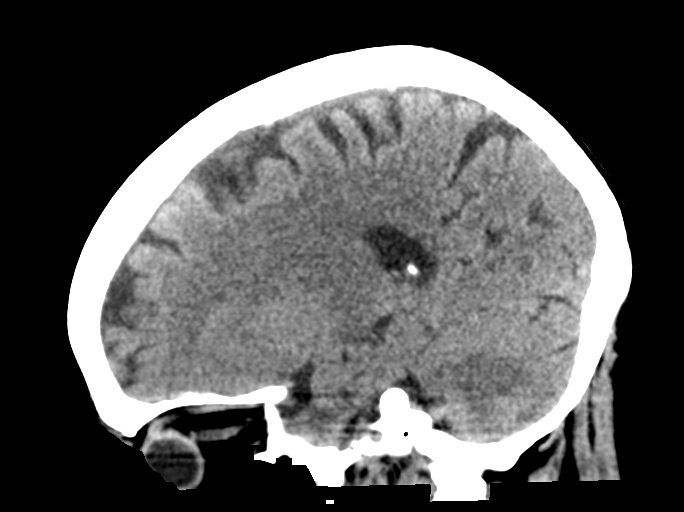

[15 of 47 positions shown; findings below may reference images not displayed]

FINDINGS: Brain: There is no acute intracranial hemorrhage, mass effect, or
edema. Gray-white differentiation is preserved. There is no
extra-axial fluid collection. Ventricles and sulci are within normal
limits in size and configuration.

Vascular: There is atherosclerotic calcification at the skull base.

Skull: Calvarium is unremarkable.

Sinuses/Orbits: No acute finding.

Other: None.
IMPRESSION: No acute intracranial abnormality.

## 2021-01-23 IMAGING — US US CAROTID DUPLEX BILAT
1 series · 13 of 24 positions shown · non-contrast
Comparison: None.

CLINICAL DATA: Syncope.

EXAM:
BILATERAL CAROTID DUPLEX ULTRASOUND
TECHNIQUE: Gray scale imaging, color Doppler and duplex ultrasound were
performed of bilateral carotid and vertebral arteries in the neck.

[Series 1: us carotid bilateral · 13 of 68 slices shown]
[im 1/68]
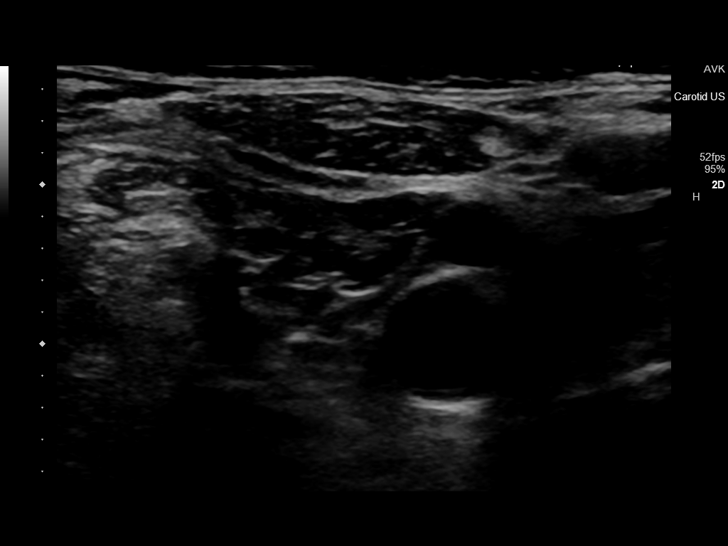
[im 6/68]
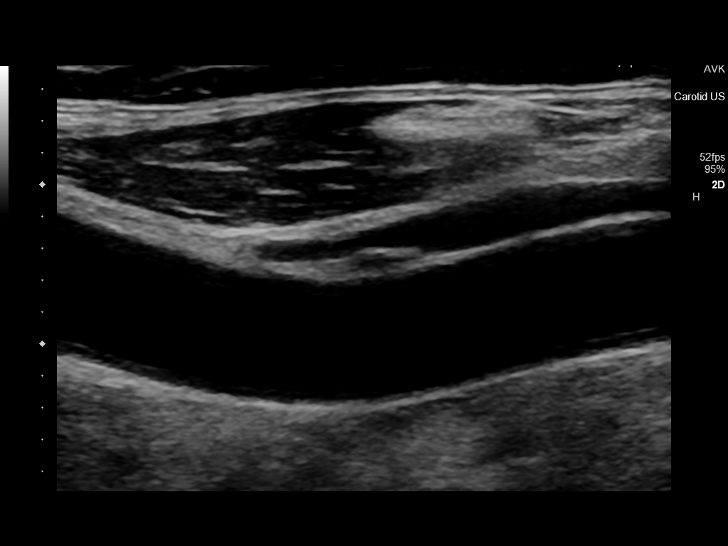
[im 12/68]
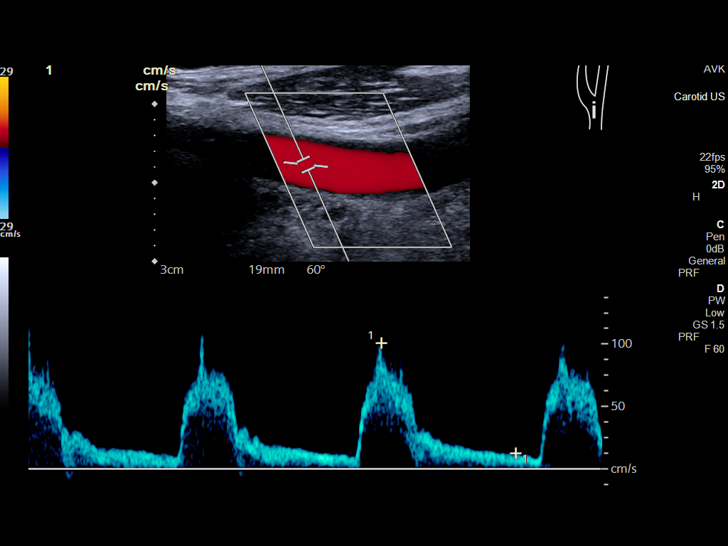
[im 18/68]
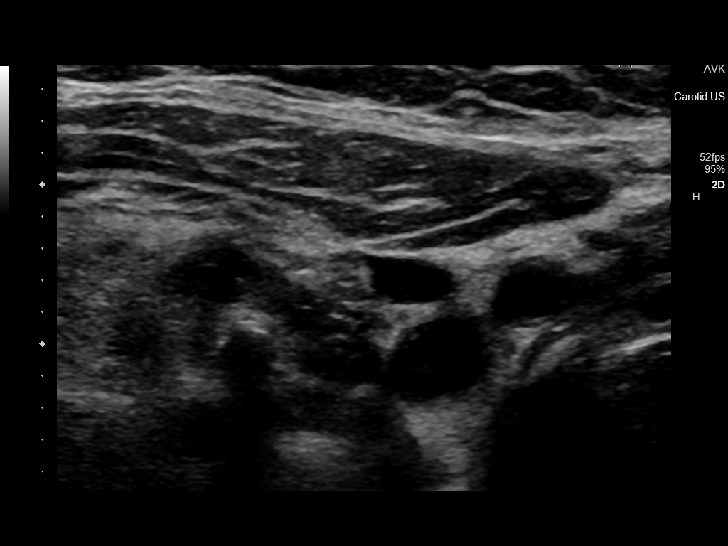
[im 24/68]
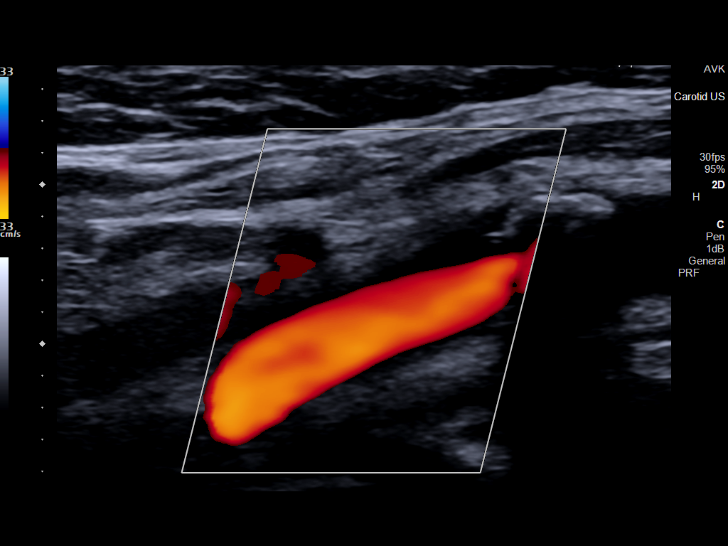
[im 30/68]
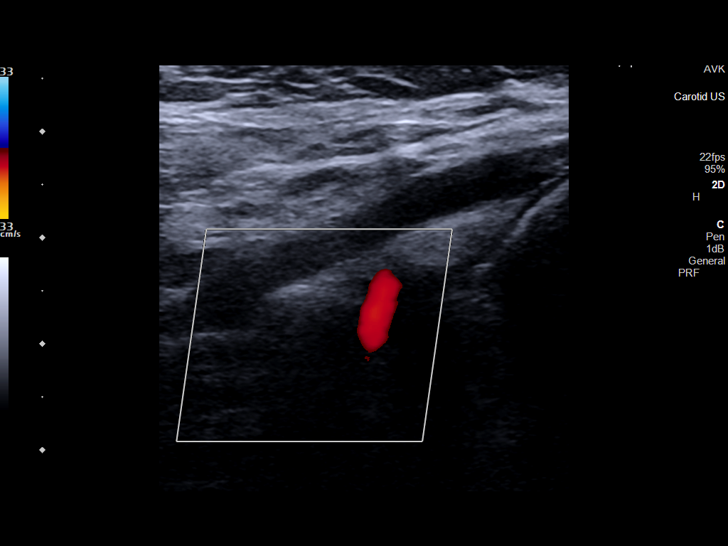
[im 35/68]
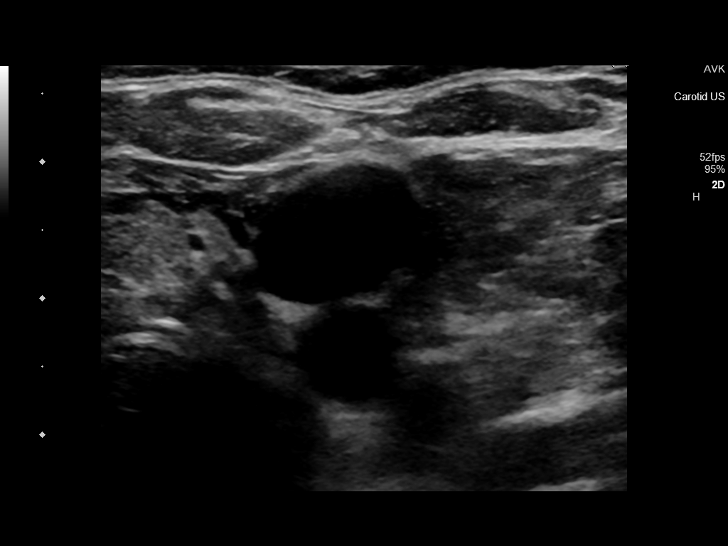
[im 38/68]
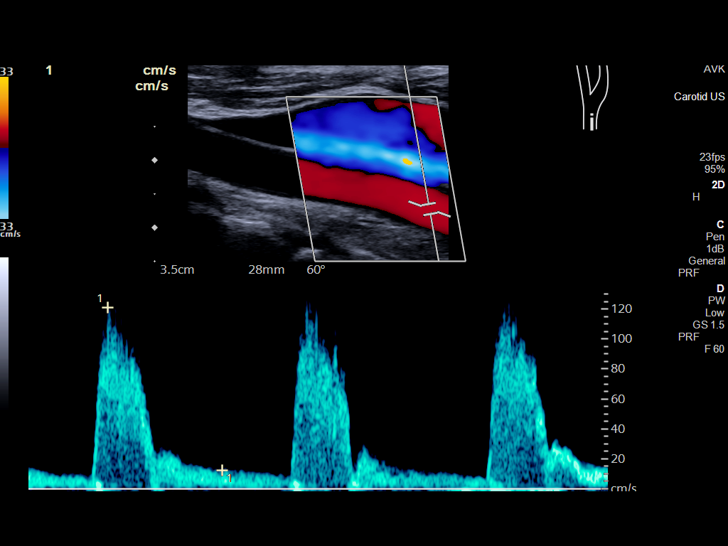
[im 44/68]
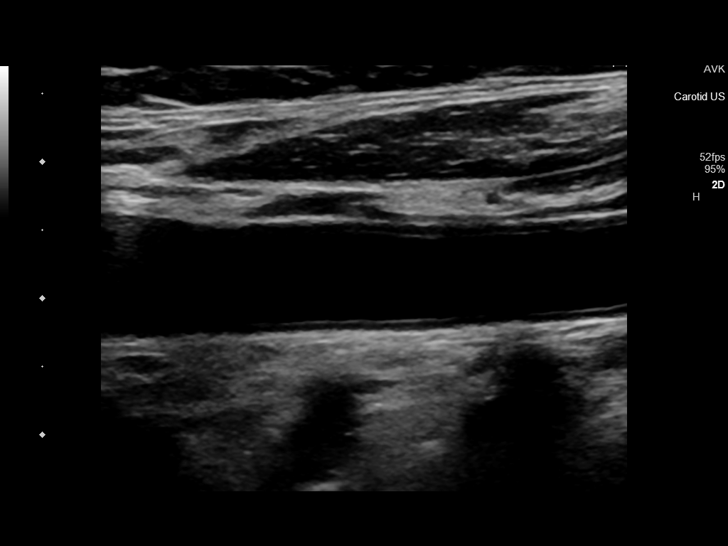
[im 50/68]
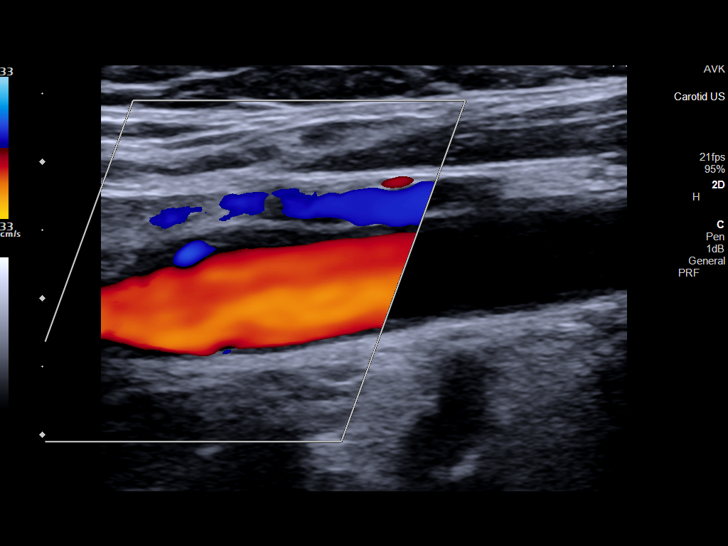
[im 56/68]
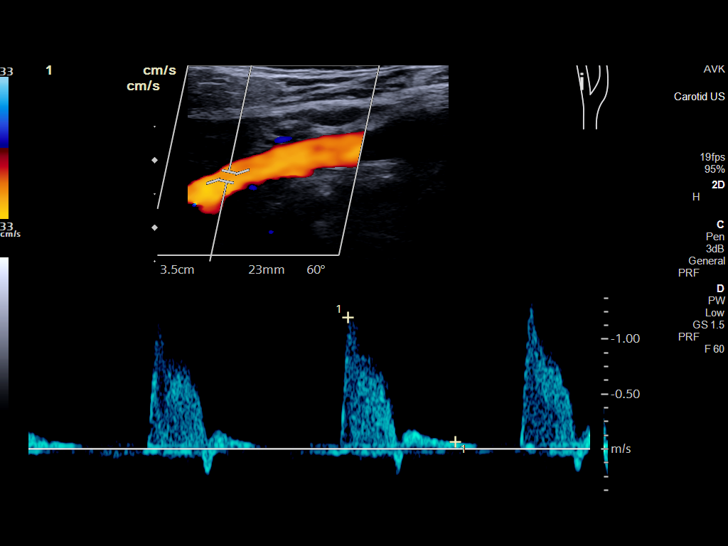
[im 62/68]
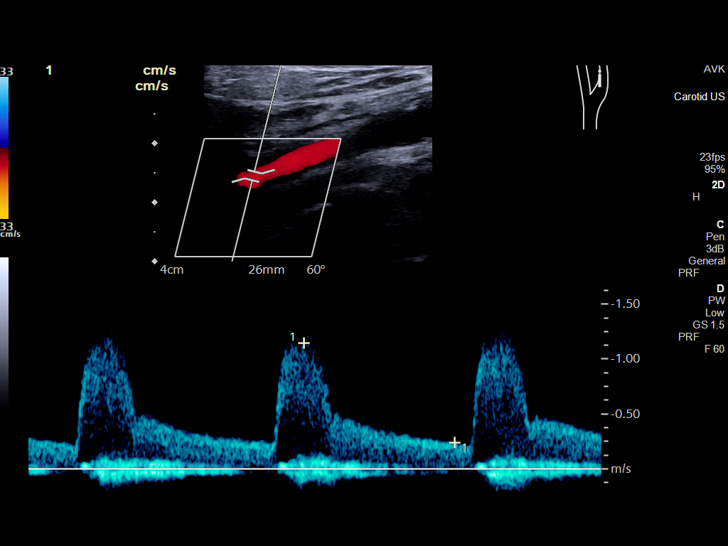
[im 68/68]
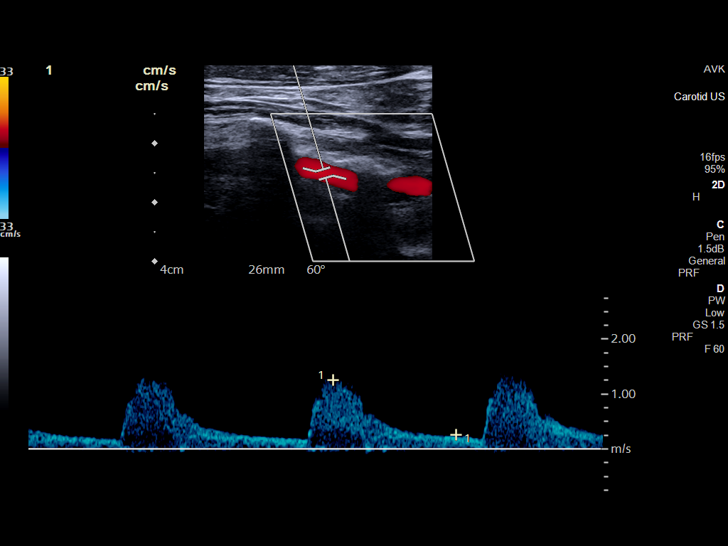

[13 of 24 positions shown; findings below may reference images not displayed]

FINDINGS: Criteria: Quantification of carotid stenosis is based on velocity
parameters that correlate the residual internal carotid diameter
with NASCET-based stenosis levels, using the diameter of the distal
internal carotid lumen as the denominator for stenosis measurement.

The following velocity measurements were obtained:

RIGHT

ICA: 123/23 cm/sec

CCA: 101/13 cm/sec

SYSTOLIC ICA/CCA RATIO:

ECA: 147 cm/sec

LEFT

ICA: 115/24 cm/sec

CCA: 108/12 cm/sec

SYSTOLIC ICA/CCA RATIO:

ECA: 120 cm/sec

RIGHT CAROTID ARTERY: Small amount of echogenic plaque at the right
carotid bulb. External carotid artery is patent with normal
waveform. Normal waveforms and velocities in the internal carotid
artery.

RIGHT VERTEBRAL ARTERY: Antegrade flow and normal waveform in the
right vertebral artery.

LEFT CAROTID ARTERY: Small amount of heterogeneous plaque at the
left carotid bulb. External carotid artery is patent with normal
waveform. Normal waveforms and velocities in the internal carotid
artery.

LEFT VERTEBRAL ARTERY: Antegrade flow and normal waveform in the
left vertebral artery.
IMPRESSION: Mild atherosclerotic disease involving bilateral carotid arteries.
Estimated degree of stenosis in the internal carotid arteries is
less than 50% bilaterally.

Patent vertebral arteries with antegrade flow.

## 2021-02-10 ENCOUNTER — Telehealth: Payer: Self-pay | Admitting: Internal Medicine

## 2021-02-10 NOTE — Telephone Encounter (Signed)
Pt called in stating that she wants to know the update of her stool sample she sent in October. Pt request call back

## 2021-02-10 NOTE — Telephone Encounter (Signed)
Spoke to pt. Advised it was negative for blood. Discussed the lab says the results were seen on MyChart. She does not access her MyChart. She asked to deactivate it. I have done that.

## 2021-02-14 DIAGNOSIS — H35371 Puckering of macula, right eye: Secondary | ICD-10-CM | POA: Diagnosis not present

## 2021-03-07 DIAGNOSIS — I480 Paroxysmal atrial fibrillation: Secondary | ICD-10-CM | POA: Diagnosis not present

## 2021-03-07 DIAGNOSIS — I779 Disorder of arteries and arterioles, unspecified: Secondary | ICD-10-CM | POA: Diagnosis not present

## 2021-03-07 DIAGNOSIS — E782 Mixed hyperlipidemia: Secondary | ICD-10-CM | POA: Diagnosis not present

## 2021-03-07 DIAGNOSIS — I340 Nonrheumatic mitral (valve) insufficiency: Secondary | ICD-10-CM | POA: Diagnosis not present

## 2021-03-07 DIAGNOSIS — I6523 Occlusion and stenosis of bilateral carotid arteries: Secondary | ICD-10-CM | POA: Diagnosis not present

## 2021-03-09 ENCOUNTER — Other Ambulatory Visit: Payer: Self-pay

## 2021-03-09 ENCOUNTER — Ambulatory Visit (INDEPENDENT_AMBULATORY_CARE_PROVIDER_SITE_OTHER): Payer: Medicare Other | Admitting: Dermatology

## 2021-03-09 DIAGNOSIS — L821 Other seborrheic keratosis: Secondary | ICD-10-CM | POA: Diagnosis not present

## 2021-03-09 DIAGNOSIS — L820 Inflamed seborrheic keratosis: Secondary | ICD-10-CM

## 2021-03-09 DIAGNOSIS — L578 Other skin changes due to chronic exposure to nonionizing radiation: Secondary | ICD-10-CM

## 2021-03-09 DIAGNOSIS — Z85828 Personal history of other malignant neoplasm of skin: Secondary | ICD-10-CM

## 2021-03-09 DIAGNOSIS — Z1283 Encounter for screening for malignant neoplasm of skin: Secondary | ICD-10-CM

## 2021-03-09 DIAGNOSIS — D1801 Hemangioma of skin and subcutaneous tissue: Secondary | ICD-10-CM | POA: Diagnosis not present

## 2021-03-09 DIAGNOSIS — D229 Melanocytic nevi, unspecified: Secondary | ICD-10-CM

## 2021-03-09 DIAGNOSIS — L814 Other melanin hyperpigmentation: Secondary | ICD-10-CM

## 2021-03-09 NOTE — Progress Notes (Signed)
° °  Follow-Up Visit   Subjective  [name] is a 81 y.o. female who presents for the following: Recheck SCC site (L pretibial - patient concerned since area is still pink and tender at times.).  The patient presents for Upper Body Skin Exam (UBSE) for skin cancer screening and mole check.  The patient has spots, moles and lesions to be evaluated, some may be new or changing and the patient has concerns that these could be cancer.  The following portions of the chart were reviewed this encounter and updated as appropriate:   Tobacco   Allergies   Meds   Problems   Med Hx   Surg Hx   Fam Hx      Review of Systems:  No other skin or systemic complaints except as noted in HPI or Assessment and Plan.  Objective  Well appearing patient in no apparent distress; mood and affect are within normal limits.  All skin waist up examined.  Forehead x 2, back x 2 (4) Erythematous stuck-on, waxy papule or plaque   Assessment & Plan  Inflamed seborrheic keratosis (4) Forehead x 2, back x 2  Destruction of lesion - Forehead x 2, back x 2 Complexity: simple   Destruction method: cryotherapy   Informed consent: discussed and consent obtained   Timeout:  patient name, date of birth, surgical site, and procedure verified Lesion destroyed using liquid nitrogen: Yes   Region frozen until ice ball extended beyond lesion: Yes   Outcome: patient tolerated procedure well with no complications   Post-procedure details: wound care instructions given    Actinic Damage - chronic, secondary to cumulative UV radiation exposure/sun exposure over time - diffuse scaly erythematous macules with underlying dyspigmentation - Recommend daily broad spectrum sunscreen SPF 30+ to sun-exposed areas, reapply every 2 hours as needed.  - Recommend staying in the shade or wearing long sleeves, sun glasses (UVA+UVB protection) and wide brim hats (4-inch brim around the entire circumference of the hat). - Call for new or  changing lesions.  Lentigines - Scattered tan macules - Due to sun exposure - Benign-appering, observe - Recommend daily broad spectrum sunscreen SPF 30+ to sun-exposed areas, reapply every 2 hours as needed. - Call for any changes  Seborrheic Keratoses - Stuck-on, waxy, tan-brown papules and/or plaques  - Benign-appearing - Discussed benign etiology and prognosis. - Observe - Call for any changes  Melanocytic Nevi - Tan-brown and/or pink-flesh-colored symmetric macules and papules - Benign appearing on exam today - Observation - Call clinic for new or changing moles - Recommend daily use of broad spectrum spf 30+ sunscreen to sun-exposed areas.   History of Squamous Cell Carcinoma of the Skin - L pretibial - No evidence of recurrence today - No lymphadenopathy - Recommend regular full body skin exams - Recommend daily broad spectrum sunscreen SPF 30+ to sun-exposed areas, reapply every 2 hours as needed.  - Call if any new or changing lesions are noted between office visits  Hemangiomas - Red papules - Discussed benign nature - Observe - Call for any changes  Return in about 1 year (around 03/09/2022) for TBSE.  Luther Redo, CMA, am acting as scribe for Sarina Ser, MD . Documentation: I have reviewed the above documentation for accuracy and completeness, and I agree with the above.  Sarina Ser, MD

## 2021-03-09 NOTE — Patient Instructions (Signed)

## 2021-03-10 ENCOUNTER — Encounter: Payer: Self-pay | Admitting: Dermatology

## 2021-03-15 DIAGNOSIS — J3089 Other allergic rhinitis: Secondary | ICD-10-CM | POA: Diagnosis not present

## 2021-03-15 DIAGNOSIS — J3081 Allergic rhinitis due to animal (cat) (dog) hair and dander: Secondary | ICD-10-CM | POA: Diagnosis not present

## 2021-03-15 DIAGNOSIS — H1045 Other chronic allergic conjunctivitis: Secondary | ICD-10-CM | POA: Diagnosis not present

## 2021-03-22 DIAGNOSIS — J3089 Other allergic rhinitis: Secondary | ICD-10-CM | POA: Diagnosis not present

## 2021-03-29 DIAGNOSIS — J3089 Other allergic rhinitis: Secondary | ICD-10-CM | POA: Diagnosis not present

## 2021-04-05 DIAGNOSIS — J3089 Other allergic rhinitis: Secondary | ICD-10-CM | POA: Diagnosis not present

## 2021-04-26 DIAGNOSIS — J3089 Other allergic rhinitis: Secondary | ICD-10-CM | POA: Diagnosis not present

## 2021-05-12 DIAGNOSIS — I340 Nonrheumatic mitral (valve) insufficiency: Secondary | ICD-10-CM | POA: Diagnosis not present

## 2021-05-12 DIAGNOSIS — E782 Mixed hyperlipidemia: Secondary | ICD-10-CM | POA: Diagnosis not present

## 2021-05-12 DIAGNOSIS — I6523 Occlusion and stenosis of bilateral carotid arteries: Secondary | ICD-10-CM | POA: Diagnosis not present

## 2021-05-12 DIAGNOSIS — I10 Essential (primary) hypertension: Secondary | ICD-10-CM | POA: Diagnosis not present

## 2021-05-12 DIAGNOSIS — I480 Paroxysmal atrial fibrillation: Secondary | ICD-10-CM | POA: Diagnosis not present

## 2021-05-17 DIAGNOSIS — J3089 Other allergic rhinitis: Secondary | ICD-10-CM | POA: Diagnosis not present

## 2021-05-27 DIAGNOSIS — J3089 Other allergic rhinitis: Secondary | ICD-10-CM | POA: Diagnosis not present

## 2021-06-02 DIAGNOSIS — M25562 Pain in left knee: Secondary | ICD-10-CM | POA: Diagnosis not present

## 2021-06-07 DIAGNOSIS — J3089 Other allergic rhinitis: Secondary | ICD-10-CM | POA: Diagnosis not present

## 2021-06-13 DIAGNOSIS — E782 Mixed hyperlipidemia: Secondary | ICD-10-CM | POA: Diagnosis not present

## 2021-06-28 DIAGNOSIS — J3089 Other allergic rhinitis: Secondary | ICD-10-CM | POA: Diagnosis not present

## 2021-07-05 DIAGNOSIS — J3089 Other allergic rhinitis: Secondary | ICD-10-CM | POA: Diagnosis not present

## 2021-07-12 DIAGNOSIS — J3089 Other allergic rhinitis: Secondary | ICD-10-CM | POA: Diagnosis not present

## 2021-08-02 DIAGNOSIS — J3089 Other allergic rhinitis: Secondary | ICD-10-CM | POA: Diagnosis not present

## 2021-08-03 ENCOUNTER — Other Ambulatory Visit: Payer: Self-pay | Admitting: Internal Medicine

## 2021-08-15 DIAGNOSIS — H18513 Endothelial corneal dystrophy, bilateral: Secondary | ICD-10-CM | POA: Diagnosis not present

## 2021-08-15 DIAGNOSIS — H35371 Puckering of macula, right eye: Secondary | ICD-10-CM | POA: Diagnosis not present

## 2021-08-23 DIAGNOSIS — J3089 Other allergic rhinitis: Secondary | ICD-10-CM | POA: Diagnosis not present

## 2021-09-06 DIAGNOSIS — E782 Mixed hyperlipidemia: Secondary | ICD-10-CM | POA: Diagnosis not present

## 2021-09-06 DIAGNOSIS — I6523 Occlusion and stenosis of bilateral carotid arteries: Secondary | ICD-10-CM | POA: Diagnosis not present

## 2021-09-06 DIAGNOSIS — I480 Paroxysmal atrial fibrillation: Secondary | ICD-10-CM | POA: Diagnosis not present

## 2021-09-06 DIAGNOSIS — I340 Nonrheumatic mitral (valve) insufficiency: Secondary | ICD-10-CM | POA: Diagnosis not present

## 2021-09-06 DIAGNOSIS — I10 Essential (primary) hypertension: Secondary | ICD-10-CM | POA: Diagnosis not present

## 2021-09-13 DIAGNOSIS — J3089 Other allergic rhinitis: Secondary | ICD-10-CM | POA: Diagnosis not present

## 2021-10-04 DIAGNOSIS — J3089 Other allergic rhinitis: Secondary | ICD-10-CM | POA: Diagnosis not present

## 2021-10-25 DIAGNOSIS — J3089 Other allergic rhinitis: Secondary | ICD-10-CM | POA: Diagnosis not present

## 2021-11-08 DIAGNOSIS — J3089 Other allergic rhinitis: Secondary | ICD-10-CM | POA: Diagnosis not present

## 2021-11-29 DIAGNOSIS — J3089 Other allergic rhinitis: Secondary | ICD-10-CM | POA: Diagnosis not present

## 2021-12-07 ENCOUNTER — Encounter: Payer: Medicare Other | Admitting: Internal Medicine

## 2021-12-14 ENCOUNTER — Encounter: Payer: Medicare Other | Admitting: Internal Medicine

## 2021-12-19 ENCOUNTER — Ambulatory Visit (INDEPENDENT_AMBULATORY_CARE_PROVIDER_SITE_OTHER): Payer: Medicare Other | Admitting: Internal Medicine

## 2021-12-19 ENCOUNTER — Encounter: Payer: Self-pay | Admitting: Internal Medicine

## 2021-12-19 VITALS — BP 138/86 | HR 120 | Temp 97.9°F | Ht 62.0 in | Wt 121.0 lb

## 2021-12-19 DIAGNOSIS — Z23 Encounter for immunization: Secondary | ICD-10-CM | POA: Diagnosis not present

## 2021-12-19 DIAGNOSIS — I4819 Other persistent atrial fibrillation: Secondary | ICD-10-CM

## 2021-12-19 DIAGNOSIS — K219 Gastro-esophageal reflux disease without esophagitis: Secondary | ICD-10-CM

## 2021-12-19 DIAGNOSIS — Z0000 Encounter for general adult medical examination without abnormal findings: Secondary | ICD-10-CM

## 2021-12-19 DIAGNOSIS — I10 Essential (primary) hypertension: Secondary | ICD-10-CM | POA: Diagnosis not present

## 2021-12-19 DIAGNOSIS — I6523 Occlusion and stenosis of bilateral carotid arteries: Secondary | ICD-10-CM

## 2021-12-19 DIAGNOSIS — F39 Unspecified mood [affective] disorder: Secondary | ICD-10-CM

## 2021-12-19 NOTE — Assessment & Plan Note (Signed)
Uses famotidine rarely

## 2021-12-19 NOTE — Assessment & Plan Note (Signed)
Now seems persistent Is on the eliquis 2.5 bid

## 2021-12-19 NOTE — Assessment & Plan Note (Signed)
Initially used for complicated grieving Seems to control anxiety---will continue the '25mg'$  sertraline daily

## 2021-12-19 NOTE — Addendum Note (Signed)
Addended by: Pilar Grammes on: 12/19/2021 03:40 PM   Modules accepted: Orders

## 2021-12-19 NOTE — Assessment & Plan Note (Signed)
On imaging Is on pravastatin 40 and eliquis

## 2021-12-19 NOTE — Assessment & Plan Note (Signed)
I have personally reviewed the Medicare Annual Wellness questionnaire and have noted 1. The patient's medical and social history 2. Their use of alcohol, tobacco or illicit drugs 3. Their current medications and supplements 4. The patient's functional ability including ADL's, fall risks, home safety risks and hearing or visual             impairment. 5. Diet and physical activities 6. Evidence for depression or mood disorders  The patients weight, height, BMI and visual acuity have been recorded in the chart I have made referrals, counseling and provided education to the patient based review of the above and I have provided the pt with a written personalized care plan for preventive services.  I have provided you with a copy of your personalized plan for preventive services. Please take the time to review along with your updated medication list.  Done with cancer screening Discussed regular exercise--add resistance Flu vaccine today COVID, Td and shingrix at the pharmacy

## 2021-12-19 NOTE — Progress Notes (Signed)
Subjective:    Patient ID: [name], female    DOB: 16-Sep-1940, 81 y.o.   MRN: [id number]  HPI Here with daughter [name] for Medicare wellness visit and follow up of chronic health conditions Reviewed advanced directives Reviewed other doctors---Dr Nehemiah Massed Albertine Patricia PA or NP)--cardiology, Dr Maryellen Pile, Dr Venetia Constable, Dr Lenor Coffin, Dr [facility] No hospitalizations or surgery this year Vision is fine Hearing is okay with hearing aides Has a small glass of wine most days No tobacco Still walks the dogs--15-30 minutes three times a day No falls Independent with instrumental ADLs No sig memory loss  Doing okay Atrial fibrillation under control BP control variable---started on valsartan doubled to '160mg'$  Of amiodarone now--but still on eliquis No palpitations Was on spironolactone--but hasn't taken it for the past 2 months No dizziness or syncope No edema No neurologic symptoms  Mood is okay No regular anxiety on this Started sertraline after husband died--but "could get wound up" before that --per daughter  Current Outpatient Medications on File Prior to Visit  Medication Sig Dispense Refill   amLODipine (NORVASC) 5 MG tablet Take 5 mg by mouth daily.     apixaban (ELIQUIS) 2.5 MG TABS tablet Take 2.5 mg by mouth 2 (two) times daily.     azelastine (ASTELIN) 0.1 % nasal spray Place 1 spray into both nostrils 2 (two) times daily. Use in each nostril as directed 30 mL 5   EPINEPHrine 0.3 mg/0.3 mL IJ SOAJ injection Inject 0.3 mg into the muscle as needed for anaphylaxis.     famotidine (PEPCID) 20 MG tablet Take 20 mg by mouth daily.     fexofenadine (ALLEGRA) 180 MG tablet Take 180 mg by mouth daily.     glucosamine-chondroitin 500-400 MG tablet Take 1 tablet by mouth 2 (two) times daily. 1 pm     Krill Oil 500 MG CAPS Take by mouth.     Multiple Vitamin (MULTIVITAMIN) capsule Take 1 capsule by mouth daily.     pravastatin  (PRAVACHOL) 40 MG tablet TAKE 1 TABLET BY MOUTH EVERYDAY AT BEDTIME 90 tablet 0   prednisoLONE acetate (PRED FORTE) 1 % ophthalmic suspension Apply to eye.     sertraline (ZOLOFT) 25 MG tablet TAKE 1 TABLET BY MOUTH EVERY DAY 90 tablet 3   sodium chloride (MURO 128) 5 % ophthalmic solution PLACE 1 DROP INTO RIGHT EYE 4 TIMES A DAY     valsartan (DIOVAN) 160 MG tablet Take 160 mg by mouth daily.     vitamin B-12 (CYANOCOBALAMIN) 500 MCG tablet Take 500 mcg by mouth daily.     No current facility-administered medications on file prior to visit.    Allergies  Allergen Reactions   Amlodipine Besy-Benazepril Hcl Swelling    Lotrel caused swelling of feet & facial swelling unilaterally   Lotrel [Amlodipine Besy-Benazepril Hcl]    Molds & Smuts Other (See Comments)   Wasp Venom Swelling    Allergic to wasps    Past Medical History:  Diagnosis Date   Adenomatous colon polyp 2005   Allergy    perennial ; shots from Dr Velora Heckler   Aortic insufficiency    Hyperlipidemia    Hypertension    Murmur    a.  04/2011 Echo:  EF 55-60%, mild-mod ai/mr   Squamous cell carcinoma of skin 08/05/2020   left pretibia EDC   Uterine cancer [facility])     Past Surgical History:  Procedure Laterality Date   BLADDER SUSPENSION     CARPAL TUNNEL RELEASE Left  COLONOSCOPY  multiple   GLAUCOMA SURGERY     "for narrow lines" (? narrow angle?)   INTRAOCULAR LENS INSERTION Bilateral 10/22/2014; 11/26/2014   TOTAL ABDOMINAL HYSTERECTOMY W/ BILATERAL SALPINGOOPHORECTOMY     abnormal PAP    Family History  Problem Relation Age of Onset   Stroke Mother 23   Heart failure Mother    Heart attack Father 81   Lung cancer Father    Cancer Maternal Aunt         X 2; Gyn & ? 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  Tobacco Use   Smoking status: Never    Passive exposure: Past   Smokeless tobacco: Never  Vaping Use   Vaping Use: Never used  Substance and Sexual Activity   Alcohol use: Yes    Comment: special events   Drug use: No   Sexual activity: Never  Other Topics Concern   Not on file  Social History Narrative   Widowed 12/19      Has living will   Children now would be health care POA   Would accept resuscitation but no prolonged ventilation   No tube feeds if cognitively unaware      2 daughters, 4 grandsons 1 great-grandson   Retired Medical sales representative from [employer] Strain: Not on [employer] Insecurity: Not on file  Transportation Needs: Not on file  Physical Activity: Not on file  Stress: Not on file  Social Connections: Not on file  Intimate Partner Violence: Not on file   Review of Systems Appetite is good Weight is stable Sleeps well Wears seat belt Teeth are okay---sees dentist No suspicious skin lesions today No heartburn --uses famotidine prn (not much). No dysphagia Bowels move fine---no blood Some urinary frequency and urgency. No sig incontinence Some arthritis issues---injection in left knee. Tylenol prn    Objective:   Physical Exam Constitutional:      Appearance: Normal appearance.  HENT:     Mouth/Throat:     Comments: No lesions Eyes:     Conjunctiva/sclera: Conjunctivae normal.     Pupils: Pupils are equal, round, and reactive to light.  Cardiovascular:     Rate and Rhythm: Normal rate. Rhythm irregular.     Pulses: Normal pulses.     Heart sounds: No murmur heard.    No gallop.  Pulmonary:     Effort: Pulmonary effort is normal.     Breath sounds: Normal breath sounds. No wheezing or rales.  Abdominal:      Palpations: Abdomen is soft.     Tenderness: There is no abdominal tenderness.  Musculoskeletal:     Cervical back: Neck supple.     Right lower leg: No edema.     Left lower leg: No edema.  Lymphadenopathy:     Cervical: No cervical adenopathy.  Skin:    Findings: No lesion or rash.  Neurological:     General: No  focal deficit present.     Mental Status: She is alert and oriented to person, place, and time.     Comments: Mini-cog normal  Psychiatric:        Mood and Affect: Mood normal.        Behavior: Behavior normal.            Assessment & Plan:

## 2021-12-19 NOTE — Assessment & Plan Note (Signed)
BP Readings from Last 3 Encounters:  12/19/21 138/86  12/03/20 140/84  12/03/19 110/60    Controlled on valsartan 160 and amlodipine 5

## 2021-12-19 NOTE — Progress Notes (Signed)
Hearing Screening - Comments:: Has hearing aids. Wearing them today Vision Screening - Comments:: June 2023

## 2021-12-20 DIAGNOSIS — J3089 Other allergic rhinitis: Secondary | ICD-10-CM | POA: Diagnosis not present

## 2021-12-20 LAB — COMPREHENSIVE METABOLIC PANEL
ALT: 14 U/L (ref 0–35)
AST: 20 U/L (ref 0–37)
Albumin: 4.6 g/dL (ref 3.5–5.2)
Alkaline Phosphatase: 45 U/L (ref 39–117)
BUN: 23 mg/dL (ref 6–23)
CO2: 28 mEq/L (ref 19–32)
Calcium: 9.2 mg/dL (ref 8.4–10.5)
Chloride: 102 mEq/L (ref 96–112)
Creatinine, Ser: 0.6 mg/dL (ref 0.40–1.20)
GFR: 83.48 mL/min (ref >60.00)
Glucose, Bld: 101 mg/dL — ABNORMAL HIGH (ref 70–99)
Potassium: 3.8 mEq/L (ref 3.5–5.1)
Sodium: 139 mEq/L (ref 135–145)
Total Bilirubin: 1 mg/dL (ref 0.2–1.2)
Total Protein: 6.9 g/dL (ref 6.0–8.3)

## 2021-12-20 LAB — CBC
HCT: 33.1 % — ABNORMAL LOW (ref 36.0–46.0)
Hemoglobin: 11.2 g/dL — ABNORMAL LOW (ref 12.0–15.0)
MCHC: 33.8 g/dL (ref 30.0–36.0)
MCV: 99.2 fl (ref 78.0–100.0)
Platelets: 229 10*3/uL (ref 150.0–400.0)
RBC: 3.34 Mil/uL — ABNORMAL LOW (ref 3.87–5.11)
RDW: 17.8 % — ABNORMAL HIGH (ref 11.5–15.5)
WBC: 4.6 10*3/uL (ref 4.0–10.5)

## 2021-12-20 LAB — LIPID PANEL
Cholesterol: 141 mg/dL (ref 0–200)
HDL: 50.2 mg/dL (ref >39.00)
LDL Cholesterol: 77 mg/dL (ref 0–99)
NonHDL: 90.63
Total CHOL/HDL Ratio: 3
Triglycerides: 68 mg/dL (ref 0.0–149.0)
VLDL: 13.6 mg/dL (ref 0.0–40.0)

## 2021-12-20 LAB — TSH: TSH: 2.72 u[IU]/mL (ref 0.35–5.50)

## 2021-12-29 ENCOUNTER — Other Ambulatory Visit: Payer: Self-pay | Admitting: Internal Medicine

## 2021-12-29 DIAGNOSIS — Z1231 Encounter for screening mammogram for malignant neoplasm of breast: Secondary | ICD-10-CM | POA: Diagnosis not present

## 2021-12-29 DIAGNOSIS — Z01419 Encounter for gynecological examination (general) (routine) without abnormal findings: Secondary | ICD-10-CM | POA: Diagnosis not present

## 2022-01-05 DIAGNOSIS — I480 Paroxysmal atrial fibrillation: Secondary | ICD-10-CM | POA: Diagnosis not present

## 2022-01-05 DIAGNOSIS — E782 Mixed hyperlipidemia: Secondary | ICD-10-CM | POA: Diagnosis not present

## 2022-01-05 DIAGNOSIS — I10 Essential (primary) hypertension: Secondary | ICD-10-CM | POA: Diagnosis not present

## 2022-01-05 DIAGNOSIS — I340 Nonrheumatic mitral (valve) insufficiency: Secondary | ICD-10-CM | POA: Diagnosis not present

## 2022-01-05 DIAGNOSIS — I6523 Occlusion and stenosis of bilateral carotid arteries: Secondary | ICD-10-CM | POA: Diagnosis not present

## 2022-01-10 ENCOUNTER — Other Ambulatory Visit: Payer: Self-pay | Admitting: Internal Medicine

## 2022-01-10 DIAGNOSIS — J3089 Other allergic rhinitis: Secondary | ICD-10-CM | POA: Diagnosis not present

## 2022-01-10 DIAGNOSIS — J301 Allergic rhinitis due to pollen: Secondary | ICD-10-CM | POA: Diagnosis not present

## 2022-01-10 DIAGNOSIS — J3081 Allergic rhinitis due to animal (cat) (dog) hair and dander: Secondary | ICD-10-CM | POA: Diagnosis not present

## 2022-01-16 DIAGNOSIS — I6523 Occlusion and stenosis of bilateral carotid arteries: Secondary | ICD-10-CM | POA: Diagnosis not present

## 2022-01-16 DIAGNOSIS — I480 Paroxysmal atrial fibrillation: Secondary | ICD-10-CM | POA: Diagnosis not present

## 2022-01-16 DIAGNOSIS — E782 Mixed hyperlipidemia: Secondary | ICD-10-CM | POA: Diagnosis not present

## 2022-01-16 DIAGNOSIS — I340 Nonrheumatic mitral (valve) insufficiency: Secondary | ICD-10-CM | POA: Diagnosis not present

## 2022-01-16 DIAGNOSIS — I10 Essential (primary) hypertension: Secondary | ICD-10-CM | POA: Diagnosis not present

## 2022-01-26 ENCOUNTER — Telehealth: Payer: Self-pay

## 2022-01-26 ENCOUNTER — Ambulatory Visit: Admit: 2022-01-26 | Payer: Medicare Other | Admitting: Internal Medicine

## 2022-01-26 ENCOUNTER — Ambulatory Visit (INDEPENDENT_AMBULATORY_CARE_PROVIDER_SITE_OTHER)
Admission: RE | Admit: 2022-01-26 | Discharge: 2022-01-26 | Disposition: A | Discharge status: Home / Self Care | Payer: Medicare Other | Source: Ambulatory Visit | Attending: Internal Medicine | Admitting: Internal Medicine

## 2022-01-26 ENCOUNTER — Ambulatory Visit (INDEPENDENT_AMBULATORY_CARE_PROVIDER_SITE_OTHER): Payer: Medicare Other | Admitting: Internal Medicine

## 2022-01-26 ENCOUNTER — Encounter: Payer: Self-pay | Admitting: Internal Medicine

## 2022-01-26 VITALS — BP 158/68 | HR 76 | Temp 97.6°F | Ht 62.0 in | Wt 128.0 lb

## 2022-01-26 DIAGNOSIS — R0789 Other chest pain: Secondary | ICD-10-CM | POA: Diagnosis not present

## 2022-01-26 DIAGNOSIS — R0602 Shortness of breath: Secondary | ICD-10-CM | POA: Diagnosis not present

## 2022-01-26 DIAGNOSIS — I6523 Occlusion and stenosis of bilateral carotid arteries: Secondary | ICD-10-CM | POA: Diagnosis not present

## 2022-01-26 DIAGNOSIS — I4819 Other persistent atrial fibrillation: Secondary | ICD-10-CM | POA: Diagnosis not present

## 2022-01-26 DIAGNOSIS — J90 Pleural effusion, not elsewhere classified: Secondary | ICD-10-CM | POA: Diagnosis not present

## 2022-01-26 SURGERY — CARDIOVERSION
Anesthesia: General

## 2022-01-26 MED ORDER — AZITHROMYCIN 250 MG PO TABS: ORAL_TABLET | ORAL | 0 refills | Status: DC

## 2022-01-26 NOTE — Patient Instructions (Signed)
Stop the amiodarone If you worsen, go to the ER. If you just have persistent problems, I will set you up with a lung specialist.

## 2022-01-26 NOTE — Assessment & Plan Note (Signed)
Seems to be persistent now Not converted with amiodarone Even if it isn't causing the SOB--no reason to continue since it hasn't converted her She prefers not having cardioversion

## 2022-01-26 NOTE — Assessment & Plan Note (Addendum)
With chest tightness I suspect pulmonary toxicity from amiodarone Nothing to suggest acute infection---but pneumonia is a possibility Could be acute coronary syndrome--but really doesn't seem like that and stress test benign 2 years ago No CHF Will check CXR  CXR shows diffuse changes ---bandlike densities, small pleural effusions (or at least on left) Not really CHF Not pneumonia Will stop the amiodarone Will try z-pak---just in case

## 2022-01-26 NOTE — Telephone Encounter (Signed)
We were able to add her on at [facility] today

## 2022-01-26 NOTE — Telephone Encounter (Signed)
Spoke to pt's daughter to see how pt was doing. She said pt is having some shortness of breath. They thought it was related to a new blood pressure/heart rate medication. Said she is coughing and runny nose for the last week. Faith did not notice it the last 2 days. Asking if she could be seen today or tomorrow. The office is full this afternoon and tomorrow. Said her BP was 170/? Yesterday or this morning. Faith was getting info from other sister.

## 2022-01-26 NOTE — Progress Notes (Signed)
Subjective:    Patient ID: [name], female    DOB: 18-Jun-1940, 81 y.o.   MRN: [id number]  HPI Here with daughter due to trouble breathing  Having some low back pain---thought it was arthritis Now it feels in her chest  Cold a few weeks ago (before Thanksgiving) Did seem to resolve  This morning--she noticed trouble catching her breath Some nasal drainage--clear with traces of blood this morning Tightness around chest --"like a rubber band" Not much cough  No palpitations No dizziness or syncope No edema  Is on amiodarone twice a day still  Did have benign stress test in 2021  Current Outpatient Medications on File Prior to Visit  Medication Sig Dispense Refill   amLODipine (NORVASC) 5 MG tablet Take 5 mg by mouth daily.     apixaban (ELIQUIS) 2.5 MG TABS tablet Take 2.5 mg by mouth 2 (two) times daily.     azelastine (ASTELIN) 0.1 % nasal spray Place 1 spray into both nostrils 2 (two) times daily. Use in each nostril as directed 30 mL 5   EPINEPHrine 0.3 mg/0.3 mL IJ SOAJ injection Inject 0.3 mg into the muscle as needed for anaphylaxis.     famotidine (PEPCID) 20 MG tablet Take 20 mg by mouth daily.     fexofenadine (ALLEGRA) 180 MG tablet Take 180 mg by mouth daily.     glucosamine-chondroitin 500-400 MG tablet Take 1 tablet by mouth 2 (two) times daily. 1 pm     Krill Oil 500 MG CAPS Take by mouth.     metoprolol succinate (TOPROL-XL) 25 MG 24 hr tablet Take 1 tablet by mouth daily.     Multiple Vitamin (MULTIVITAMIN) capsule Take 1 capsule by mouth daily.     pravastatin (PRAVACHOL) 40 MG tablet TAKE 1 TABLET BY MOUTH EVERYDAY AT BEDTIME 90 tablet 3   prednisoLONE acetate (PRED FORTE) 1 % ophthalmic suspension Apply to eye.     sertraline (ZOLOFT) 25 MG tablet TAKE 1 TABLET BY MOUTH EVERY DAY 100 tablet 3   sodium chloride (MURO 128) 5 % ophthalmic solution PLACE 1 DROP INTO RIGHT EYE 4 TIMES A DAY     valsartan (DIOVAN) 160 MG tablet Take 160 mg by mouth 2  (two) times daily.     vitamin B-12 (CYANOCOBALAMIN) 500 MCG tablet Take 500 mcg by mouth daily.     No current facility-administered medications on file prior to visit.    Allergies  Allergen Reactions   Amlodipine Besy-Benazepril Hcl Swelling    Lotrel caused swelling of feet & facial swelling unilaterally   Lotrel [Amlodipine Besy-Benazepril Hcl]    Molds & Smuts Other (See Comments)   Wasp Venom Swelling    Allergic to wasps    Past Medical History:  Diagnosis Date   Adenomatous colon polyp 2005   Allergy    perennial ; shots from Dr Velora Heckler   Aortic insufficiency    Hyperlipidemia    Hypertension    Murmur    a.  04/2011 Echo:  EF 55-60%, mild-mod ai/mr   Squamous cell carcinoma of skin 08/05/2020   left pretibia EDC   Uterine cancer ([place])     Past Surgical History:  Procedure Laterality Date   BLADDER SUSPENSION     CARPAL TUNNEL RELEASE Left    COLONOSCOPY  multiple   GLAUCOMA SURGERY     "for narrow lines" (? narrow angle?)   INTRAOCULAR LENS INSERTION Bilateral 10/22/2014; 11/26/2014   TOTAL ABDOMINAL HYSTERECTOMY W/ BILATERAL SALPINGOOPHORECTOMY  abnormal PAP    Family History  Problem Relation Age of Onset   Stroke Mother 73   Heart failure Mother    Heart attack Father 15   Lung cancer Father    Cancer Maternal Aunt         X 2; Gyn & ? 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  Tobacco Use   Smoking status: Never    Passive exposure: Past   Smokeless tobacco: Never  Vaping Use   Vaping Use: Never used  Substance and Sexual Activity   Alcohol use: Yes     Comment: special events   Drug use: No   Sexual activity: Never  Other Topics Concern   Not on file  Social History Narrative   Widowed 12/19      Has living will   Children now would be health care POA   Would accept resuscitation but no prolonged ventilation   No tube feeds if cognitively unaware      2 daughters, 4 grandsons 1 great-grandson   Retired Medical sales representative from [place] Strain: Not on [employer] Insecurity: Not on file  Transportation Needs: Not on file  Physical Activity: Not on file  Stress: Not on file  Social Connections: Not on file  Intimate Partner Violence: Not on file   Review of Systems Appetite is okay No swallowing problems Voice is weaker--per daughter Weight is stable over time    Objective:   Physical Exam Constitutional:      Appearance: Normal appearance.  Cardiovascular:     Rate and Rhythm: Normal rate. Rhythm irregular.     Heart sounds: No murmur heard.    No gallop.  Pulmonary:     Effort: Pulmonary effort is normal.     Breath sounds: No wheezing or rales.     Comments: Slightly decreased breath sounds Musculoskeletal:     Cervical back: Neck supple.     Right lower leg: No edema.     Left lower leg: No edema.  Lymphadenopathy:     Cervical: No cervical adenopathy.  Neurological:     Mental Status: She is alert.            Assessment & Plan:

## 2022-01-30 ENCOUNTER — Telehealth: Payer: Self-pay | Admitting: Internal Medicine

## 2022-01-30 DIAGNOSIS — R0602 Shortness of breath: Secondary | ICD-10-CM

## 2022-01-30 NOTE — Telephone Encounter (Signed)
Patient daughter [name] called in and stated that [name] she had a slight improvement and has since regressed. She stated that she is still getting winded easily. She would for someone to give her a call. She stated that she would like to get her to see a pulmonologist. She can be reached at [phone]. Thank you!

## 2022-01-30 NOTE — Telephone Encounter (Signed)
Spoke to daughter. She has an appt tomorrow.

## 2022-01-30 NOTE — Addendum Note (Signed)
Addended by: Viviana Simpler I on: 01/30/2022 01:04 PM   Modules accepted: Orders

## 2022-01-31 ENCOUNTER — Other Ambulatory Visit
Admission: RE | Admit: 2022-01-31 | Discharge: 2022-01-31 | Disposition: A | Discharge status: Home / Self Care | Payer: Medicare Other | Source: Ambulatory Visit | Attending: Pulmonary Disease | Admitting: Pulmonary Disease

## 2022-01-31 ENCOUNTER — Institutional Professional Consult (permissible substitution): Payer: Medicare Other | Admitting: Student in an Organized Health Care Education/Training Program

## 2022-01-31 ENCOUNTER — Encounter: Payer: Self-pay | Admitting: Pulmonary Disease

## 2022-01-31 ENCOUNTER — Ambulatory Visit (INDEPENDENT_AMBULATORY_CARE_PROVIDER_SITE_OTHER): Payer: Medicare Other | Admitting: Pulmonary Disease

## 2022-01-31 ENCOUNTER — Telehealth: Payer: Self-pay | Admitting: Pulmonary Disease

## 2022-01-31 VITALS — BP 136/70 | HR 60 | Temp 98.0°F | Ht 62.0 in | Wt 127.6 lb

## 2022-01-31 DIAGNOSIS — I509 Heart failure, unspecified: Secondary | ICD-10-CM | POA: Insufficient documentation

## 2022-01-31 DIAGNOSIS — J9601 Acute respiratory failure with hypoxia: Secondary | ICD-10-CM | POA: Diagnosis not present

## 2022-01-31 DIAGNOSIS — I272 Pulmonary hypertension, unspecified: Secondary | ICD-10-CM | POA: Insufficient documentation

## 2022-01-31 DIAGNOSIS — I4819 Other persistent atrial fibrillation: Secondary | ICD-10-CM | POA: Diagnosis not present

## 2022-01-31 DIAGNOSIS — I340 Nonrheumatic mitral (valve) insufficiency: Secondary | ICD-10-CM

## 2022-01-31 DIAGNOSIS — R0602 Shortness of breath: Secondary | ICD-10-CM

## 2022-01-31 LAB — RENAL FUNCTION PANEL
Albumin: 3.9 g/dL (ref 3.5–5.0)
Anion gap: 10 (ref 5–15)
BUN: 17 mg/dL (ref 8–23)
CO2: 25 mmol/L (ref 22–32)
Calcium: 9.2 mg/dL (ref 8.9–10.3)
Chloride: 103 mmol/L (ref 98–111)
Creatinine, Ser: 0.52 mg/dL (ref 0.44–1.00)
GFR, Estimated: >60 mL/min (ref >60)
Glucose, Bld: 119 mg/dL — ABNORMAL HIGH (ref 70–99)
Phosphorus: 3.6 mg/dL (ref 2.5–4.6)
Potassium: 3.7 mmol/L (ref 3.5–5.1)
Sodium: 138 mmol/L (ref 135–145)

## 2022-01-31 LAB — CBC WITH DIFFERENTIAL/PLATELET
Abs Immature Granulocytes: 0.01 10*3/uL (ref 0.00–0.07)
Basophils Absolute: 0.1 10*3/uL (ref 0.0–0.1)
Basophils Relative: 2 %
Eosinophils Absolute: 0.3 10*3/uL (ref 0.0–0.5)
Eosinophils Relative: 5 %
HCT: 32.3 % — ABNORMAL LOW (ref 36.0–46.0)
Hemoglobin: 10.7 g/dL — ABNORMAL LOW (ref 12.0–15.0)
Immature Granulocytes: 0 %
Lymphocytes Relative: 25 %
Lymphs Abs: 1.4 10*3/uL (ref 0.7–4.0)
MCH: 31.7 pg (ref 26.0–34.0)
MCHC: 33.1 g/dL (ref 30.0–36.0)
MCV: 95.6 fL (ref 80.0–100.0)
Monocytes Absolute: 0.6 10*3/uL (ref 0.1–1.0)
Monocytes Relative: 11 %
Neutro Abs: 3.3 10*3/uL (ref 1.7–7.7)
Neutrophils Relative %: 57 %
Platelets: 283 10*3/uL (ref 150–400)
RBC: 3.38 MIL/uL — ABNORMAL LOW (ref 3.87–5.11)
RDW: 17.4 % — ABNORMAL HIGH (ref 11.5–15.5)
WBC: 5.7 10*3/uL (ref 4.0–10.5)
nRBC: 0 % (ref 0.0–0.2)

## 2022-01-31 LAB — BRAIN NATRIURETIC PEPTIDE: B Natriuretic Peptide: 501.6 pg/mL — ABNORMAL HIGH (ref 0.0–100.0)

## 2022-01-31 MED ORDER — FUROSEMIDE 20 MG PO TABS: 20.0000 mg | ORAL_TABLET | Freq: Every day | ORAL | 0 refills | Status: DC

## 2022-01-31 MED ORDER — POTASSIUM CHLORIDE CRYS ER 20 MEQ PO TBCR: 20.0000 meq | EXTENDED_RELEASE_TABLET | Freq: Every day | ORAL | 0 refills | Status: DC

## 2022-01-31 NOTE — Telephone Encounter (Signed)
[name] states not able to schedule patient today. Patient scheduled 04/09/2021 with Dr.Gollan. [place] phone number is [phone].

## 2022-01-31 NOTE — Telephone Encounter (Signed)
I spoke with the patient's daughter Sharyl Nimrod), she said they called Dr. Elwyn Reach office and confirmed the appointment on 02/06/22 at 3:00pm. I have notified Dr. Patsey Berthold. Nothing further is needed.

## 2022-01-31 NOTE — Telephone Encounter (Signed)
Per Dr. Patsey Berthold verbally- she has made patient aware of appt date/time. She voiced understanding.  Nothing further needed.

## 2022-01-31 NOTE — Progress Notes (Signed)
Subjective:    Patient ID: [name], female    DOB: [birth year], 80 y.o.   MRN: [id number] Patient Care Team: Venia Carbon, MD as PCP - General (Internal Medicine)  Chief Complaint  Patient presents with   Consult    SOB since Thursday of last week. SOB constant. Dry cough mostly at night and early in the mornings.   HPI [name] is an 81 year old lifelong never smoker, albeit with secondhand exposure in the past) and a history as noted below, who presents for evaluation of dyspnea present since around mid November.  She is kindly referred by Dr. Viviana Simpler.  Patient has a history of paroxysmal atrial fibrillation.  In or around 9 November she saw audiology at [facility] clinic who noted her to be on atrial fibrillation.  Per the note of that visit she had "been recently taken off of spironolactone due to concerns of side effects".  Apparently the patient was having issues with fatigue.  These improved after discontinuing spironolactone.  Her EKG done at that time confirmed atrial fibrillation with a rate of 94 bpm.  She was restarted on amiodarone at 200 mg twice daily and was instructed to continue Eliquis 2.5 mg twice daily.  After that visit the patient started noticing shortness of breath which she describes as having chest tightness "like a band" across her lower rib cage and restlessness with even the slightest amount of activity.  She actually noticed that her bras were too tight and actually made her breathing difficult.  He had a follow-up visit with [facility] cardiology on 20 November.  She was instructed to continue amiodarone 200 mg twice daily and metoprolol was added at 25 mg daily for heart rate control.  Her heart rate at that time was 88 bpm.  Patient still exhibited atrial fibrillation.  Continue to be symptomatic with regards to shortness of breath and saw Dr. Silvio Pate on 30 November.  Chest x-ray was obtained at that time and this has been independently reviewed  and noted as below.  The patient does not note any palpitations no paroxysmal nocturnal dyspnea.  She has had mild orthopnea.  She has not noted any fevers, chills or sweats.  Dry cough when she is supine.  No sputum production.  No hemoptysis.  She had an upper respiratory infection prior to Thanksgiving but that was very minor and resolved within a few days.  Dyspnea preceded this.  Patient's daughter her weight is up 7 pounds early November.  DATA 12/14/2020 echocardiogram [facility]): LVEF over 55% VH, severe enlargement of the left and right atria.  Moderate to severe MR/TR, severe pulmonary hypertension. 01/26/2022 chest x-ray PA and lateral: Cardiomegaly, interstitial edema, small bilateral pleural effusions, perihilar bandlike atelectasis.   Review of Systems A 10 point review of systems was performed and it is as noted above otherwise negative.  Past Medical History:  Diagnosis Date   Adenomatous colon polyp 2005   Allergy    perennial ; shots from Dr Velora Heckler   Aortic insufficiency    Hyperlipidemia    Hypertension    Murmur    a.  04/2011 Echo:  EF 55-60%, mild-mod ai/mr   Squamous cell carcinoma of skin 08/05/2020   left pretibia EDC   Uterine cancer [facility])    Past Surgical History:  Procedure Laterality Date   BLADDER SUSPENSION     CARPAL TUNNEL RELEASE Left    COLONOSCOPY  multiple   GLAUCOMA SURGERY     "for narrow lines" (?  narrow angle?)   INTRAOCULAR LENS INSERTION Bilateral 10/22/2014; 11/26/2014   TOTAL ABDOMINAL HYSTERECTOMY W/ BILATERAL SALPINGOOPHORECTOMY     abnormal PAP   Patient Active Problem List   Diagnosis Date Noted   SOB (shortness of breath) 01/26/2022   Carotid artery disease ([place]) 12/03/2019   Atrial fibrillation ([place]) 08/14/2018   Mood disorder ([place]) 06/21/2017   Preventative health care 05/31/2016   Advance directive discussed with patient 05/31/2016   Aortic insufficiency 06/08/2011   GERD 04/20/2009   RAYNAUD'S SYNDROME, HX OF 04/20/2009    History of colonic polyps 01/07/2008   HYPERLIPIDEMIA 12/14/2006   Essential hypertension 12/14/2006   Allergic rhinitis due to pollen 12/14/2006   SYMPTOM, INCONTINENCE, MIXED, URGE/STRESS 12/14/2006   Family History  Problem Relation Age of Onset   Stroke Mother 42   Heart failure Mother    Heart attack Father 40   Lung cancer Father    Cancer Maternal Aunt         X 2; Gyn & ? primary   Diabetes Maternal Grandmother    Esophageal cancer Neg Hx    Colon cancer Neg Hx    Stomach cancer Neg Hx    Rectal cancer Neg Hx    Social History   Tobacco Use   Smoking status: Never    Passive exposure: Past   Smokeless tobacco: Never  Substance Use Topics   Alcohol use: Yes    Comment: special events   Allergies  Allergen Reactions   Amlodipine Besy-Benazepril Hcl Swelling    Lotrel caused swelling of feet & facial swelling unilaterally   Lotrel [Amlodipine Besy-Benazepril Hcl]    Molds & Smuts Other (See Comments)   Wasp Venom Swelling    Allergic to wasps   Current Meds  Medication Sig   amLODipine (NORVASC) 5 MG tablet Take 5 mg by mouth daily.   apixaban (ELIQUIS) 2.5 MG TABS tablet Take 2.5 mg by mouth 2 (two) times daily.   azelastine (ASTELIN) 0.1 % nasal spray Place 1 spray into both nostrils 2 (two) times daily. Use in each nostril as directed   azithromycin (ZITHROMAX Z-PAK) 250 MG tablet Take 2 tablets (500 mg) on  Day 1,  followed by 1 tablet (250 mg) once daily on Days 2 through 5.   EPINEPHrine 0.3 mg/0.3 mL IJ SOAJ injection Inject 0.3 mg into the muscle as needed for anaphylaxis.   famotidine (PEPCID) 20 MG tablet Take 20 mg by mouth daily.   fexofenadine (ALLEGRA) 180 MG tablet Take 180 mg by mouth daily.   glucosamine-chondroitin 500-400 MG tablet Take 1 tablet by mouth 2 (two) times daily. 1 pm   Krill Oil 500 MG CAPS Take by mouth.   metoprolol succinate (TOPROL-XL) 25 MG 24 hr tablet Take 1 tablet by mouth daily.   Multiple Vitamin (MULTIVITAMIN)  capsule Take 1 capsule by mouth daily.   pravastatin (PRAVACHOL) 40 MG tablet TAKE 1 TABLET BY MOUTH EVERYDAY AT BEDTIME   prednisoLONE acetate (PRED FORTE) 1 % ophthalmic suspension Apply to eye.   sertraline (ZOLOFT) 25 MG tablet TAKE 1 TABLET BY MOUTH EVERY DAY   sodium chloride (MURO 128) 5 % ophthalmic solution PLACE 1 DROP INTO RIGHT EYE 4 TIMES A DAY   valsartan (DIOVAN) 160 MG tablet Take 160 mg by mouth 2 (two) times daily.   vitamin B-12 (CYANOCOBALAMIN) 500 MCG tablet Take 500 mcg by mouth daily.   Immunization History  Administered Date(s) Administered   Fluad Quad(high Dose 65+) 12/03/2020, 12/19/2021  Influenza Split 01/03/2011, 12/28/2011   Influenza Whole 12/14/2006, 01/07/2008, 12/16/2008, 12/15/2009   Influenza, High Dose Seasonal PF 12/25/2012   Influenza,inj,Quad PF,6+ Mos 11/28/2013, 12/04/2014, 12/08/2015, 11/27/2016, 11/30/2017, 12/05/2018, 11/29/2019   PFIZER Comirnaty(Gray Top)Covid-19 Tri-Sucrose Vaccine 03/19/2019, 04/12/2019   PFIZER(Purple Top)SARS-COV-2 Vaccination 03/19/2019, 04/12/2019, 06/24/2019, 12/22/2019   Pneumococcal Conjugate-13 05/28/2015   Pneumococcal Polysaccharide-23 05/01/2012   Td 04/20/2009       Objective:   Physical Exam BP 136/70 (BP Location: Left Arm, Cuff Size: Normal)   Pulse 60   Temp 98 F (36.7 C)   Ht '5\' 2"'$  (1.575 m)   Wt 127 lb 9.6 oz (57.9 kg)   SpO2 95%   BMI 23.34 kg/m  GENERAL: Patient is a well-developed, well-nourished elderly woman in no acute respiratory distress however does exhibit mild tachypnea.  She is fully ambulatory, no conversational dyspnea per se. HEAD: Normocephalic, atraumatic.  EYES: Pupils equal, round, reactive to light.  No scleral icterus.  MOUTH: Caps and crowns present.  Oral mucosa moist.  No thrush. NECK: Supple. No thyromegaly. Trachea midline. + JVD.  No adenopathy. PULMONARY: Good air entry bilaterally.  No adventitious sounds. CARDIOVASCULAR: S1 and S2.  Atrial fibrillation with CVR.   Loud mitral regurgitation murmur 3/6.  ABDOMEN: Mildly protuberant otherwise benign. MUSCULOSKELETAL: No joint deformity, no clubbing, there is trace to +1 pitting edema of the lower extremities.  NEUROLOGIC: Grossly nonfocal. SKIN: Intact,warm,dry. PSYCH: Mood and behavior normal.  Ambulatory oximetry: This revealed that the patient's resting heart rate was 58 bpm, oxygen saturation at rest was 94%.  With ambulation the patient desaturated to 88% after ambulating approximately 500 feet.  Heart rate remained at 68 bpm.  Patient required oxygen at 2 L/min to maintain oxygen saturations between 90 to 92% with ambulation.  CBC    Component Value Date/Time   WBC 5.7 01/31/2022 1247   RBC 3.38 (L) 01/31/2022 1247   HGB 10.7 (L) 01/31/2022 1247   HCT 32.3 (L) 01/31/2022 1247   PLT 283 01/31/2022 1247   MCV 95.6 01/31/2022 1247   MCH 31.7 01/31/2022 1247   MCHC 33.1 01/31/2022 1247   RDW 17.4 (H) 01/31/2022 1247   LYMPHSABS 1.4 01/31/2022 1247   MONOABS 0.6 01/31/2022 1247   EOSABS 0.3 01/31/2022 1247   BASOSABS 0.1 01/31/2022 1247   BMET    Component Value Date/Time   NA 138 01/31/2022 1247   NA 139 06/22/2011 0909   K 3.7 01/31/2022 1247   CL 103 01/31/2022 1247   CO2 25 01/31/2022 1247   GLUCOSE 119 (H) 01/31/2022 1247   BUN 17 01/31/2022 1247   BUN 14 06/22/2011 0909   CREATININE 0.52 01/31/2022 1247   CALCIUM 9.2 01/31/2022 1247   GFRNONAA >60 01/31/2022 1247   BNP    Component Value Date/Time   BNP 501.6 (H) 01/31/2022 1247   Chest x-ray performed 26 January 2022 showing cardiomegaly, mild interstitial edema and small bilateral pleural effusions:     Assessment & Plan:     ICD-10-CM   1. Shortness of breath  R06.02 AMB REFERRAL FOR DME   Suspect that this is due to worsening pulmonary hypertension Worsening mitral regurgitation also a possibility Will need diuretic therapy    2. Acute respiratory failure with hypoxia (HCC)  J96.01    Patient may criteria for  oxygen supplementation Desaturations with ambulation likely related to pulmonary hypertension Supplement with 2 L/min with ambulation    3. Other congestive heart failure (HCC)  I50.9 Renal Function Panel  B Nat Peptide    CBC w/Diff    CANCELED: CBC w/Diff    CANCELED: B Nat Peptide    CANCELED: Renal Function Panel   Obtained renal panel, BMP and CBC BMP 501 Will treat with Lasix 20 mg Supplement potassium given K is 3.7    4. Severe pulmonary hypertension (HCC)  I27.20 Pulse oximetry, overnight    AMB REFERRAL FOR DME   Likely due to mitral disease Patient has upcoming cardiology appointment    5. Severe mitral insufficiency  I34.0    This issue adds complexity to her management Defer further evaluation to cardiology    6. Persistent atrial fibrillation (HCC)  I48.19    Recently started on amiodarone 11/20 ([place]) On Eliquisper cards at [facility] This issue adds complexity to her management     Orders Placed This Encounter  Procedures   CBC w/Diff    Standing Status:   Future    Standing Expiration Date:   05/02/2022   B Nat Peptide    Standing Status:   Future    Standing Expiration Date:   05/02/2022   Renal Function Panel    Standing Status:   Future    Standing Expiration Date:   05/02/2022    Order Specific Question:   Has the patient fasted?    Answer:   No    Order Specific Question:   Remote health to draw?    Answer:   No   AMB REFERRAL FOR DME    Referral Priority:   Routine    Referral Type:   Durable Medical Equipment Purchase    Number of Visits Requested:   1   Pulse oximetry, overnight    (On O2 or Room Air) & (DME)    Standing Status:   Future    Standing Expiration Date:   02/01/2023   Meds ordered this encounter  Medications   furosemide (LASIX) 20 MG tablet    Sig: Take 1 tablet (20 mg total) by mouth daily.    Dispense:  30 tablet    Refill:  0   potassium chloride SA (KLOR-CON M) 20 MEQ tablet    Sig: Take 1 tablet (20 mEq total) by mouth daily.     Dispense:  30 tablet    Refill:  0   The patient may criteria for oxygen supplementation during ambulatory oximetry today.  I suspect that this is due to worsening pulmonary hypertension.  In addition the patient has severe mitral regurgitation previously noted.  Suspect that newly found A-fib, discontinuation of spironolactone and initiation of amiodarone may have worsened the patient's issues with dyspnea likely on the basis of decompensated heart failure.  We obtain laboratory data as above, patient's BNP is 501.  Aside from oxygen supplementation we will also start her on low-dose Lasix.  As her potassium was 3.7 will supplement potassium as well.  Patient's family requested that her cardiology appointment be moved up.  They have elected to switch to [facility] cardiology.  She has an appointment with Dr. Esmond Plants on 11 December at 3:20 PM.  Defer further workup of her cardiac issues to cardiology.  Also defer the continuation of diuretic therapy and potassium therapy to cardiology as well.   I have updated Dr. Viviana Simpler with the above plan via secure chat.  Total visit time 60 minutes.  Renold Don, MD Advanced Bronchoscopy PCCM [facility]    *This note was dictated using voice recognition software/Dragon.  Despite best  efforts to proofread, errors can occur which can change the meaning. Any transcriptional errors that result from this process are unintentional and may not be fully corrected at the time of dictation.

## 2022-01-31 NOTE — Patient Instructions (Signed)
Your oxygen level drops when you walk.  We we will proceed with providing you with oxygen.  We have sent the request to [employer] which is the [employer] formerly known [employer].  Checking your oxygen level at nighttime the day of the test please do not wear your oxygen I just want to see how low your oxygen goes when you sleep.  This is important to be able to keep you supplied with oxygen.  We have ordered blood work to determine about your fluid status.  I suspect we are going to need to give you a diuretic (fluid pill) to help you with your shortness of breath issues.  We will see you in follow-up in 4 to 6 weeks time but call sooner should any new problems arise.  We will keep you apprised of the findings of the blood work and any interventions necessary.

## 2022-02-01 ENCOUNTER — Telehealth: Payer: Self-pay | Admitting: Pulmonary Disease

## 2022-02-01 NOTE — Addendum Note (Signed)
Addended by: Claudette Head A on: 02/01/2022 03:30 PM   Modules accepted: Orders

## 2022-02-01 NOTE — Telephone Encounter (Signed)
Spoke to [place] with [employer]. She stated that order needed to state continuous. Order has bene corrected.   Lm to make patient aware.

## 2022-02-02 NOTE — Telephone Encounter (Signed)
Patient is aware of below message and voiced her understanding.  Nothing further needed.   

## 2022-02-05 NOTE — Progress Notes (Unsigned)
Cardiology Office Note  Date:  02/06/2022   ID:  [name], DOB 12-28-1940, MRN [id number]  PCP:  Venia Carbon, MD   Chief Complaint  Patient presents with   New Patient (Initial Visit)    Ref by Dr. Patsey Berthold for shortness of breath & pulmonary HTN. Patient c/o chest tightness, shortness of breath and bilateral LE edema. Medications reviewed by the patient verbally.     HPI:  [name] is a 81 year old woman with past medical history of Persistent atrial fibrillation Pulmonary hypertension Shortness of breath Severe mitral valve regurgitation, severely dilated left atrium PAD with mild bilateral carotid disease Who presents by referral from [facility] for persistent atrial fibrillation, pulmonary hypertension  Presents with family on today's visit Last seen by [facility] cardiology January 16, 2022 was continued on amiodarone 200 twice daily for her atrial fibrillation On Eliquis She decided against cardioversion at the time  Prior imaging studies reviewed Echocardiogram 2020/stress echo showing severe MR Echocardiogram 2021 with mild to moderate MR  Echocardiogram October 2022 NORMAL LEFT VENTRICULAR SYSTOLIC FUNCTION   WITH MILD LVH  NORMAL RIGHT VENTRICULAR SYSTOLIC FUNCTION  TRIVIAL STENOSIS NOTED (See above)  MODERATE to SEVERE MR, TR  AVA(VTI)= 1.34cm^2  SCLEROTIC AoV  MILD AR, PR  EF 55%  SEVERE PULMONARY HTN   Has been on Lasix for the past 5 days or so for shortness of breath Reports amiodarone held by primary care out of concern for x-ray findings  Family unaware of diagnosis of mitral valve regurgitation and tricuspid valve regurgitation  Denies significant PND orthopnea, lower extremity edema  EKG personally reviewed by myself on todays visit Sinus bradycardia rate 59 bpm nonspecific ST abnormality  PMH:   has a past medical history of Adenomatous colon polyp (2005), Allergy, Aortic insufficiency, Hyperlipidemia, Hypertension, Murmur,  Squamous cell carcinoma of skin (08/05/2020), and Uterine cancer ([place]).  PSH:    Past Surgical History:  Procedure Laterality Date   BLADDER SUSPENSION     CARPAL TUNNEL RELEASE Left    COLONOSCOPY  multiple   GLAUCOMA SURGERY     "for narrow lines" (? narrow angle?)   INTRAOCULAR LENS INSERTION Bilateral 10/22/2014; 11/26/2014   TOTAL ABDOMINAL HYSTERECTOMY W/ BILATERAL SALPINGOOPHORECTOMY     abnormal PAP    Current Outpatient Medications  Medication Sig Dispense Refill   amLODipine (NORVASC) 5 MG tablet Take 5 mg by mouth daily.     apixaban (ELIQUIS) 2.5 MG TABS tablet Take 2.5 mg by mouth 2 (two) times daily.     azelastine (ASTELIN) 0.1 % nasal spray Place 1 spray into both nostrils 2 (two) times daily. Use in each nostril as directed 30 mL 5   EPINEPHrine 0.3 mg/0.3 mL IJ SOAJ injection Inject 0.3 mg into the muscle as needed for anaphylaxis.     famotidine (PEPCID) 20 MG tablet Take 20 mg by mouth daily.     fexofenadine (ALLEGRA) 180 MG tablet Take 180 mg by mouth daily.     furosemide (LASIX) 20 MG tablet Take 1 tablet (20 mg total) by mouth daily. 30 tablet 0   glucosamine-chondroitin 500-400 MG tablet Take 1 tablet by mouth 2 (two) times daily. 1 pm     Krill Oil 500 MG CAPS Take by mouth.     metoprolol succinate (TOPROL-XL) 25 MG 24 hr tablet Take 1 tablet by mouth daily.     Multiple Vitamin (MULTIVITAMIN) capsule Take 1 capsule by mouth daily.     potassium chloride SA (KLOR-CON M) 20  MEQ tablet Take 1 tablet (20 mEq total) by mouth daily. 30 tablet 0   pravastatin (PRAVACHOL) 40 MG tablet TAKE 1 TABLET BY MOUTH EVERYDAY AT BEDTIME 90 tablet 3   prednisoLONE acetate (PRED FORTE) 1 % ophthalmic suspension Apply to eye.     sertraline (ZOLOFT) 25 MG tablet TAKE 1 TABLET BY MOUTH EVERY DAY 100 tablet 3   sodium chloride (MURO 128) 5 % ophthalmic solution PLACE 1 DROP INTO RIGHT EYE 4 TIMES A DAY     triamcinolone (NASACORT ALLERGY 24HR) 55 MCG/ACT AERO nasal inhaler  Place 2 sprays into the nose daily.     valsartan (DIOVAN) 160 MG tablet Take 160 mg by mouth 2 (two) times daily.     vitamin B-12 (CYANOCOBALAMIN) 500 MCG tablet Take 500 mcg by mouth daily.     azithromycin (ZITHROMAX Z-PAK) 250 MG tablet Take 2 tablets (500 mg) on  Day 1,  followed by 1 tablet (250 mg) once daily on Days 2 through 5. (Patient not taking: Reported on 01/31/2022) 6 each 0   No current facility-administered medications for this visit.     Allergies:   Amlodipine besy-benazepril hcl, Amlodipine besy-benazepril hcl, Lotrel [amlodipine besy-benazepril hcl], Molds & smuts, and Wasp venom   Social History:  The patient  reports that she has never smoked. She has been exposed to tobacco smoke. She has never used smokeless tobacco. She reports current alcohol use. She reports that she does not use drugs.   Family History:   family history includes Cancer in her maternal aunt; Diabetes in her maternal grandmother; Heart attack (age of onset: 28) in her father; Heart failure in her mother; Lung cancer in her father; Stroke (age of onset: 50) in her mother.    Review of Systems: Review of Systems  Constitutional: Negative.   HENT: Negative.    Respiratory:  Positive for shortness of breath.   Cardiovascular: Negative.   Gastrointestinal: Negative.   Musculoskeletal: Negative.   Neurological: Negative.   Psychiatric/Behavioral: Negative.    All other systems reviewed and are negative.    PHYSICAL EXAM: VS:  BP (!) 160/62 (BP Location: Left Arm, Patient Position: Sitting, Cuff Size: Normal)   Pulse (!) 59   Ht '5\' 3"'$  (1.6 m)   Wt 124 lb 4 oz (56.4 kg)   SpO2 98%   BMI 22.01 kg/m  , BMI Body mass index is 22.01 kg/m. GEN: Well nourished, well developed, in no acute distress HEENT: normal Neck: no JVD, carotid bruits, or masses Cardiac: RRR; 5-1/7 systolic murmur left sternal border No rubs, or gallops,no edema  Respiratory:  clear to auscultation bilaterally, normal  work of breathing GI: soft, nontender, nondistended, + BS MS: no deformity or atrophy Skin: warm and dry, no rash Neuro:  Strength and sensation are intact Psych: euthymic mood, full affect   Recent Labs: 12/19/2021: ALT 14; TSH 2.72 01/31/2022: B Natriuretic Peptide 501.6; BUN 17; Creatinine, Ser 0.52; Hemoglobin 10.7; Platelets 283; Potassium 3.7; Sodium 138    Lipid Panel Lab Results  Component Value Date   CHOL 141 12/19/2021   HDL 50.20 12/19/2021   LDLCALC 77 12/19/2021   TRIG 68.0 12/19/2021      Wt Readings from Last 3 Encounters:  02/06/22 124 lb 4 oz (56.4 kg)  01/31/22 127 lb 9.6 oz (57.9 kg)  01/26/22 128 lb (58.1 kg)       ASSESSMENT AND PLAN:  Problem List Items Addressed This Visit       Cardiology  Problems   Severe mitral insufficiency   Severe pulmonary hypertension (HCC)   Atrial fibrillation (HCC) - Primary   Essential hypertension   Carotid artery disease ([place])   Other Visit Diagnoses     Tricuspid valve insufficiency, unspecified etiology          Persistent atrial fibrillation EKG today confirming normal sinus rhythm Recommend she continue amiodarone 200 mg daily with Eliquis and metoprolol Would likely be difficult to keep normal sinus rhythm given severely dilated left atrium and severe MR on prior echo 2022  Mitral valve disorder Details as severe back in 2020 and 2022 Also with tricuspid valve regurgitation Recommend repeat echocardiogram We discussed structural heart clinic in [facility] for valvular heart disease depending on echo results  Pulmonary hypertension Detailed on prior echocardiograms Recommend Lasix 20 twice daily alternating with 20 daily Recommend she take potassium when she takes Lasix BMP in several weeks time We discussed advanced heart failure clinic pending echo results   Total encounter time more than 60 minutes  Greater than 50% was spent in counseling and coordination of care with the  patient    Signed, Esmond Plants, M.D., Ph.D. [place], [place]

## 2022-02-06 ENCOUNTER — Telehealth: Payer: Self-pay | Admitting: Pulmonary Disease

## 2022-02-06 ENCOUNTER — Ambulatory Visit: Payer: Medicare Other | Attending: Cardiovascular Disease | Admitting: Cardiovascular Disease

## 2022-02-06 ENCOUNTER — Encounter: Payer: Self-pay | Admitting: Cardiovascular Disease

## 2022-02-06 VITALS — BP 160/62 | HR 59 | Ht 63.0 in | Wt 124.2 lb

## 2022-02-06 DIAGNOSIS — Z79899 Other long term (current) drug therapy: Secondary | ICD-10-CM | POA: Diagnosis not present

## 2022-02-06 DIAGNOSIS — I4819 Other persistent atrial fibrillation: Secondary | ICD-10-CM | POA: Insufficient documentation

## 2022-02-06 DIAGNOSIS — I272 Pulmonary hypertension, unspecified: Secondary | ICD-10-CM | POA: Diagnosis not present

## 2022-02-06 DIAGNOSIS — I10 Essential (primary) hypertension: Secondary | ICD-10-CM | POA: Diagnosis not present

## 2022-02-06 DIAGNOSIS — I340 Nonrheumatic mitral (valve) insufficiency: Secondary | ICD-10-CM | POA: Diagnosis not present

## 2022-02-06 DIAGNOSIS — I6523 Occlusion and stenosis of bilateral carotid arteries: Secondary | ICD-10-CM

## 2022-02-06 DIAGNOSIS — I071 Rheumatic tricuspid insufficiency: Secondary | ICD-10-CM

## 2022-02-06 MED ORDER — POTASSIUM CHLORIDE CRYS ER 20 MEQ PO TBCR: EXTENDED_RELEASE_TABLET | ORAL | 1 refills | Status: DC

## 2022-02-06 MED ORDER — VALSARTAN 160 MG PO TABS: 160.0000 mg | ORAL_TABLET | Freq: Two times a day (BID) | ORAL | 1 refills | Status: DC

## 2022-02-06 MED ORDER — FUROSEMIDE 20 MG PO TABS: ORAL_TABLET | ORAL | 1 refills | Status: DC

## 2022-02-06 MED ORDER — METOPROLOL SUCCINATE ER 25 MG PO TB24: ORAL_TABLET | ORAL | 1 refills | Status: DC

## 2022-02-06 MED ORDER — AMLODIPINE BESYLATE 5 MG PO TABS: 5.0000 mg | ORAL_TABLET | Freq: Every day | ORAL | 1 refills | Status: DC

## 2022-02-06 MED ORDER — APIXABAN 2.5 MG PO TABS: 2.5000 mg | ORAL_TABLET | Freq: Two times a day (BID) | ORAL | 3 refills | Status: DC

## 2022-02-06 MED ORDER — AMIODARONE HCL 200 MG PO TABS: 200.0000 mg | ORAL_TABLET | Freq: Every day | ORAL | 1 refills | Status: DC

## 2022-02-06 NOTE — Patient Instructions (Addendum)
Medication Instructions:  - Your physician has recommended you make the following change in your medication:   1) INCREASE  lasix (furosemide) 20 mg: - take 1 tablet (20 mg) twice every other day alternating with 1 tablet every other day   2) INCREASE Potassium 20 meq: - take 1 tablet (20 meq) twice every other day alternating with 1 tablet every other day  3) RESTART Amiodarone 200 mg: - take 1 tablet by mouth once a day  If you need a refill on your cardiac medications before your next appointment, please call your pharmacy.    Lab work: - Your physician recommends that you return for lab work in: 2-3 weeks  BMP   Nature conservation officer at [facility] 1st desk on the right to check in (REGISTRATION)  Lab hours: Monday- Friday (7:30 am- 5:30 pm)    Testing/Procedures: 1) Echocardiogram:  (for mitral valve regurgitation, Pulmonary hypertension)   -Your physician has requested that you have an echocardiogram. Echocardiography is a painless test that uses sound waves to create images of your heart. It provides your doctor with information about the size and shape of your heart and how well your heart's chambers and valves are working. This procedure takes approximately one hour. There are no restrictions for this procedure. There is a possibility that an IV may need to be started during your test to inject an image enhancing agent. This is done to obtain more optimal pictures of your heart. Therefore we ask that you do at least drink some water prior to coming in to hydrate your veins.   Please do NOT wear cologne, perfume, aftershave, or lotions (deodorant is allowed). Please arrive 15 minutes prior to your appointment time.  Follow-Up: At [facility], you and your health needs are our priority.  As part of our continuing mission to provide you with exceptional heart care, we have created designated Provider Care Teams.  These Care Teams include your primary Cardiologist (physician) and  Advanced Practice Providers (APPs -  Physician Assistants and Nurse Practitioners) who all work together to provide you with the care you need, when you need it.  You will need a follow up appointment in 1 month, or after echo is completed  Providers on your designated Care Team:   Murray Hodgkins, NP Christell Faith, PA-C Cadence Kathlen Mody, [place]  COVID-19 Vaccine Information can be found at: [email or web address].uk For questions related to vaccine distribution or appointments, please email [email or web address] or call [phone].    Echocardiogram An echocardiogram is a test that uses sound waves (ultrasound) to produce images of the heart. Images from an echocardiogram can provide important information about: Heart size and shape. The size and thickness and movement of your heart's walls. Heart muscle function and strength. Heart valve function or if you have stenosis. Stenosis is when the heart valves are too narrow. If blood is flowing backward through the heart valves (regurgitation). A tumor or infectious growth around the heart valves. Areas of heart muscle that are not working well because of poor blood flow or injury from a heart attack. Aneurysm detection. An aneurysm is a weak or damaged part of an artery wall. The wall bulges out from the normal force of blood pumping through the body. Tell a health care provider about: Any allergies you have. All medicines you are taking, including vitamins, herbs, eye drops, creams, and over-the-counter medicines. Any blood disorders you have. Any surgeries you have had. Any medical conditions you have. Whether you are pregnant or may  be pregnant. What are the risks? Generally, this is a safe test. However, problems may occur, including an allergic reaction to dye (contrast) that may be used during the test. What happens before the test? No specific preparation is needed. You may eat and  drink normally. What happens during the test?  You will take off your clothes from the waist up and put on a hospital gown. Electrodes or electrocardiogram (ECG)patches may be placed on your chest. The electrodes or patches are then connected to a device that monitors your heart rate and rhythm. You will lie down on a table for an ultrasound exam. A gel will be applied to your chest to help sound waves pass through your skin. A handheld device, called a transducer, will be pressed against your chest and moved over your heart. The transducer produces sound waves that travel to your heart and bounce back (or "echo" back) to the transducer. These sound waves will be captured in real-time and changed into images of your heart that can be viewed on a video monitor. The images will be recorded on a computer and reviewed by your health care provider. You may be asked to change positions or hold your breath for a short time. This makes it easier to get different views or better views of your heart. In some cases, you may receive contrast through an IV in one of your veins. This can improve the quality of the pictures from your heart. The procedure may vary among health care providers and hospitals. What can I expect after the test? You may return to your normal, everyday life, including diet, activities, and medicines, unless your health care provider tells you not to do that. Follow these instructions at home: It is up to you to get the results of your test. Ask your health care provider, or the department that is doing the test, when your results will be ready. Keep all follow-up visits. This is important. Summary An echocardiogram is a test that uses sound waves (ultrasound) to produce images of the heart. Images from an echocardiogram can provide important information about the size and shape of your heart, heart muscle function, heart valve function, and other possible heart problems. You do not need  to do anything to prepare before this test. You may eat and drink normally. After the echocardiogram is completed, you may return to your normal, everyday life, unless your health care provider tells you not to do that. This information is not intended to replace advice given to you by your health care provider. Make sure you discuss any questions you have with your health care provider. Document Revised: 10/27/2020 Document Reviewed: 10/07/2019 Elsevier Patient Education  [place].

## 2022-02-06 NOTE — Telephone Encounter (Signed)
[name], can you assist with this? Can you send to another DME?

## 2022-02-07 NOTE — Telephone Encounter (Signed)
Anita, can you help with this? 

## 2022-02-07 NOTE — Telephone Encounter (Signed)
I spoke with [name]. She reviewed the order and said it was right. She will look into why they are asking for more information. She will call back if she needs anything further.

## 2022-02-07 NOTE — Telephone Encounter (Signed)
Mardene Celeste from [place] order needs to say dispense evaluate and tirtate patient for POC/ OCD portable system to maintain O2 stats greater than 90.Needs NPI on order, Darral Dash number is [phone].

## 2022-02-07 NOTE — Telephone Encounter (Signed)
Sent urgent message to [employer] giving them the chance to correct issue

## 2022-02-09 ENCOUNTER — Ambulatory Visit: Payer: Medicare Other | Attending: Cardiovascular Disease

## 2022-02-09 ENCOUNTER — Other Ambulatory Visit: Payer: Self-pay | Admitting: Pulmonary Disease

## 2022-02-09 DIAGNOSIS — I080 Rheumatic disorders of both mitral and aortic valves: Secondary | ICD-10-CM | POA: Diagnosis not present

## 2022-02-09 DIAGNOSIS — I340 Nonrheumatic mitral (valve) insufficiency: Secondary | ICD-10-CM

## 2022-02-09 DIAGNOSIS — I272 Pulmonary hypertension, unspecified: Secondary | ICD-10-CM

## 2022-02-09 DIAGNOSIS — R011 Cardiac murmur, unspecified: Secondary | ICD-10-CM | POA: Diagnosis not present

## 2022-02-09 DIAGNOSIS — I10 Essential (primary) hypertension: Secondary | ICD-10-CM | POA: Diagnosis not present

## 2022-02-09 DIAGNOSIS — I4819 Other persistent atrial fibrillation: Secondary | ICD-10-CM | POA: Insufficient documentation

## 2022-02-09 DIAGNOSIS — J9601 Acute respiratory failure with hypoxia: Secondary | ICD-10-CM

## 2022-02-10 ENCOUNTER — Ambulatory Visit: Payer: Medicare Other | Admitting: Medical

## 2022-02-10 LAB — ECHOCARDIOGRAM COMPLETE
AR max vel: 1.7 cm2
AV Area VTI: 1.82 cm2
AV Area mean vel: 1.84 cm2
AV Mean grad: 8 mmHg
AV Peak grad: 14.7 mmHg
AV Vena cont: 0.45 cm
Ao pk vel: 1.92 m/s
Area-P 1/2: 5.06 cm2
Calc EF: 58.2 %
S' Lateral: 3.4 cm
Single Plane A2C EF: 59.4 %
Single Plane A4C EF: 57.9 %

## 2022-02-13 ENCOUNTER — Ambulatory Visit: Payer: Medicare Other | Admitting: Cardiovascular Disease

## 2022-02-15 ENCOUNTER — Telehealth: Payer: Self-pay | Admitting: Pulmonary Disease

## 2022-02-15 NOTE — Telephone Encounter (Signed)
I spoke with [name] (DNP), she said he mother is doing much better. She is not having any problems during the day and she is doing all the things she wants to do. She is still using the oxygen at night.   Per Dr. Patsey Berthold, ok to cancel order for POC. She does need to do the ONO without oxygen.   I notified Lynette.

## 2022-02-15 NOTE — Telephone Encounter (Signed)
This is not at all what I said.  I said that eventually we may be able to wean her down to just using it at nighttime but for the time being she needs oxygen.  Unless she has improved with management per cardiology.  My note specifies that she needs the O2.  Structure and specified that she needed her O2.

## 2022-02-15 NOTE — Telephone Encounter (Signed)
Dr. Patsey Berthold you saw this patient in the office on 01/31/22 and she was walked and we placed order for 02 and POC Eval. I received a message from message from Eldred with [employer] and she stated the Pt declined stating she was told she only needed o2 at night.

## 2022-02-15 NOTE — Telephone Encounter (Signed)
[name] with [employer] responded that the patient has currently home 02 and would need to come into the office to do POC Eval

## 2022-02-17 ENCOUNTER — Telehealth: Payer: Self-pay | Admitting: Pulmonary Disease

## 2022-02-17 NOTE — Telephone Encounter (Signed)
Patient qualified for supplemental oxygen with exertion  during 01/31/2022 visit. These recommendations are mentioned on AVS. Patient's daughter, Carole Civil) states that she was under the impression that patient should only wear oxygen at night. She stated that patient is doing much better with increased Lasix. She is requesting that Dr. Patsey Berthold review echo and advise if she recommends oxygen since patient's sx have improved.  Dr. Patsey Berthold, please advise. Thanks

## 2022-02-17 NOTE — Telephone Encounter (Signed)
Please refer to 02/15/2022 and 02/17/2021 phone note. Patient declined o2 per [employer]. Faith is aware that patient will be re evaluated at upcoming appt. Dr. Patsey Berthold reviewed ONO-recommend continuing 2L QHS. Spo2 dropped as low as 73%. Patient's daughter, Carole Civil) is aware of results and voiced her understanding.  Nothing further needed.

## 2022-02-17 NOTE — Telephone Encounter (Signed)
ATC Casen x2-unable to leave vm due to mailbox not expecting more messages.  Will close encounter per office protocol.

## 2022-02-17 NOTE — Telephone Encounter (Signed)
She qualified for oxygen.  I told her she would have to use it until we reevaluated her.  I also told her that the best case scenario was that we could titrate her to only at nighttime.  She has significant pulmonary hypertension and oxygen helps with reducing the pressure in the artery going from the heart to the lungs.  I am glad she is doing better.  But that was the reason why we set that up follow-up appointment.  If she has a way to monitor her oxygen during the day I would advise that I will not discontinue the daytime oxygen until she is reevaluated.

## 2022-02-17 NOTE — Telephone Encounter (Signed)
PT daughter, [name], calling. She said mother is using O2 at night only. She got a call Wednesday and that person Investment banker, corporate) said "Oh, I thought she was using it all the time." Please call Faith to confirm proper usage. TY.  [phone]

## 2022-02-17 NOTE — Telephone Encounter (Signed)
Casen from [employer] requesting status of CMN that was faxed on 12/19 and today. Please call back if CMN was not received at [phone] for patient oxygen

## 2022-02-17 NOTE — Telephone Encounter (Signed)
Patient's daughter, Carole Civil) is aware of below message and voiced her understanding.  She will purchase pulse ox to monitor spo2.  ATC Casen with [employer] to make him aware of below message. Unable to leave vm due to mailbox being full.  Will call back.

## 2022-02-22 ENCOUNTER — Other Ambulatory Visit
Admission: RE | Admit: 2022-02-22 | Discharge: 2022-02-22 | Disposition: A | Discharge status: Home / Self Care | Payer: Medicare Other | Attending: Cardiovascular Disease | Admitting: Cardiovascular Disease

## 2022-02-22 DIAGNOSIS — I4819 Other persistent atrial fibrillation: Secondary | ICD-10-CM | POA: Insufficient documentation

## 2022-02-22 DIAGNOSIS — I10 Essential (primary) hypertension: Secondary | ICD-10-CM | POA: Insufficient documentation

## 2022-02-22 DIAGNOSIS — Z79899 Other long term (current) drug therapy: Secondary | ICD-10-CM | POA: Diagnosis not present

## 2022-02-22 LAB — BASIC METABOLIC PANEL
Anion gap: 6 (ref 5–15)
BUN: 26 mg/dL — ABNORMAL HIGH (ref 8–23)
CO2: 29 mmol/L (ref 22–32)
Calcium: 9.1 mg/dL (ref 8.9–10.3)
Chloride: 106 mmol/L (ref 98–111)
Creatinine, Ser: 0.85 mg/dL (ref 0.44–1.00)
GFR, Estimated: >60 mL/min (ref >60)
Glucose, Bld: 117 mg/dL — ABNORMAL HIGH (ref 70–99)
Potassium: 4.2 mmol/L (ref 3.5–5.1)
Sodium: 141 mmol/L (ref 135–145)

## 2022-02-28 NOTE — Progress Notes (Unsigned)
Cardiology Office Note  Date:  03/01/2022   ID:  Wave Calzada, DOB 1940-12-05, MRN [id number]  PCP:  Venia Carbon, MD   Chief Complaint  Patient presents with   Follow up Echo     Patient c/o pain under her ribs that comes and goes. Medications reviewed by the patient verbally.     HPI:  [name] is a 82 year old woman with past medical history of Persistent atrial fibrillation Pulmonary hypertension, nonsmoker, wears oxygen at night Followed by pulmonary Shortness of breath Moderate mitral valve regurgitation, severely dilated left atrium PAD with mild bilateral carotid disease Who presents by referral from [facility] for persistent atrial fibrillation, pulmonary hypertension  Presents today with her daughter First seen by myself in clinic February 06, 2022 Prior to that was seen at [place] At that time was in normal sinus rhythm Given history of pulmonary hypertension, Recommendation made for Lasix twice daily alternating with daily dosing  On today's visit reports that she feels relatively well, Walks the dogs,  [place] Ache under the rubs at times, better on lasix but still has this feeling occasionally  Tolerating lasix 20 BID alternating with 20 daily Lab work done several days ago, stable renal function, slight uptrend in BUN  Echocardiogram performed February 09, 2022 Normal ejection fraction, moderate MR, severe pulmonary hypertension as detailed below   1. Left ventricular ejection fraction, by estimation, is 60 to 65%. The  left ventricle has normal function. The left ventricle has no regional  wall motion abnormalities. Left ventricular diastolic parameters are  consistent with Grade II diastolic  dysfunction (pseudonormalization).   2. Right ventricular systolic function is normal. The right ventricular  size is normal. There is severely elevated pulmonary artery systolic  pressure. The estimated right ventricular systolic  pressure is 71.6 mmHg.   3. Left atrial size was moderately dilated.   4. The mitral valve is normal in structure. Moderate mitral valve  regurgitation. No evidence of mitral stenosis.   5. The aortic valve is tricuspid. Aortic valve regurgitation is mild to  moderate. No aortic stenosis is present.   EKG personally reviewed by myself on todays visit Sinus bradycardia rate 48 bpm nonspecific T wave abnormality  Other past medical history reviewed seen by [facility] cardiology January 16, 2022 was continued on amiodarone 200 twice daily for her atrial fibrillation, On Eliquis She decided against cardioversion at the time Converted to normal sinus rhythm on her own on evaluation February 06, 2022  Prior imaging studies reviewed Echocardiogram 2020/stress echo showing severe MR Echocardiogram 2021 with mild to moderate MR  Echocardiogram October 2022 NORMAL LEFT VENTRICULAR SYSTOLIC FUNCTION   WITH MILD LVH  NORMAL RIGHT VENTRICULAR SYSTOLIC FUNCTION  TRIVIAL STENOSIS NOTED (See above)  MODERATE to SEVERE MR, TR  AVA(VTI)= 1.34cm^2  SCLEROTIC AoV  MILD AR, PR  EF 55%  SEVERE PULMONARY HTN    PMH:   has a past medical history of Adenomatous colon polyp (2005), Allergy, Aortic insufficiency, Hyperlipidemia, Hypertension, Murmur, Squamous cell carcinoma of skin (08/05/2020), and Uterine cancer ([place]).  PSH:    Past Surgical History:  Procedure Laterality Date   BLADDER SUSPENSION     CARPAL TUNNEL RELEASE Left    COLONOSCOPY  multiple   GLAUCOMA SURGERY     "for narrow lines" (? narrow angle?)   INTRAOCULAR LENS INSERTION Bilateral 10/22/2014; 11/26/2014   TOTAL ABDOMINAL HYSTERECTOMY W/ BILATERAL SALPINGOOPHORECTOMY     abnormal PAP    Current Outpatient Medications  Medication Sig Dispense Refill   amiodarone (PACERONE) 200 MG tablet Take 1 tablet (200 mg total) by mouth daily. 90 tablet 1   amLODipine (NORVASC) 5 MG tablet Take 1 tablet (5 mg total) by mouth daily. 90  tablet 1   apixaban (ELIQUIS) 2.5 MG TABS tablet Take 1 tablet (2.5 mg total) by mouth 2 (two) times daily. 60 tablet 3   azelastine (ASTELIN) 0.1 % nasal spray Place 1 spray into both nostrils 2 (two) times daily. Use in each nostril as directed 30 mL 5   EPINEPHrine 0.3 mg/0.3 mL IJ SOAJ injection Inject 0.3 mg into the muscle as needed for anaphylaxis.     famotidine (PEPCID) 20 MG tablet Take 20 mg by mouth daily.     fexofenadine (ALLEGRA) 180 MG tablet Take 180 mg by mouth daily.     furosemide (LASIX) 20 MG tablet Take 1 tablet (20 mg) twice every other day alternating with 1 tablet (20 mg) once every other day 135 tablet 1   glucosamine-chondroitin 500-400 MG tablet Take 1 tablet by mouth 2 (two) times daily. 1 pm     Krill Oil 500 MG CAPS Take by mouth.     metoprolol succinate (TOPROL-XL) 25 MG 24 hr tablet Take 1 tablet (25 mg) by mouth once daily 90 tablet 1   Multiple Vitamin (MULTIVITAMIN) capsule Take 1 capsule by mouth daily.     potassium chloride SA (KLOR-CON M) 20 MEQ tablet Take 1 tablet (20 meq) twice every other day alternating with 1 tablet (20 meq) once every other day 135 tablet 1   pravastatin (PRAVACHOL) 40 MG tablet TAKE 1 TABLET BY MOUTH EVERYDAY AT BEDTIME 90 tablet 3   prednisoLONE acetate (PRED FORTE) 1 % ophthalmic suspension Apply to eye.     sertraline (ZOLOFT) 25 MG tablet TAKE 1 TABLET BY MOUTH EVERY DAY 100 tablet 3   sodium chloride (MURO 128) 5 % ophthalmic solution PLACE 1 DROP INTO RIGHT EYE 4 TIMES A DAY     triamcinolone (NASACORT ALLERGY 24HR) 55 MCG/ACT AERO nasal inhaler Place 2 sprays into the nose daily.     valsartan (DIOVAN) 160 MG tablet Take 1 tablet (160 mg total) by mouth 2 (two) times daily. 180 tablet 1   vitamin B-12 (CYANOCOBALAMIN) 500 MCG tablet Take 500 mcg by mouth daily.     azithromycin (ZITHROMAX Z-PAK) 250 MG tablet Take 2 tablets (500 mg) on  Day 1,  followed by 1 tablet (250 mg) once daily on Days 2 through 5. (Patient not  taking: Reported on 01/31/2022) 6 each 0   No current facility-administered medications for this visit.     Allergies:   Amlodipine besy-benazepril hcl, Amlodipine besy-benazepril hcl, Lotrel [amlodipine besy-benazepril hcl], Molds & smuts, and Wasp venom   Social History:  The patient  reports that she has never smoked. She has been exposed to tobacco smoke. She has never used smokeless tobacco. She reports current alcohol use. She reports that she does not use drugs.   Family History:   family history includes Cancer in her maternal aunt; Diabetes in her maternal grandmother; Heart attack (age of onset: 66) in her father; Heart failure in her mother; Lung cancer in her father; Stroke (age of onset: 94) in her mother.    Review of Systems: Review of Systems  Constitutional: Negative.   HENT: Negative.    Respiratory: Negative.    Cardiovascular: Negative.   Gastrointestinal: Negative.   Musculoskeletal: Negative.  Occasional rib pain on the left more than the right  Neurological: Negative.   Psychiatric/Behavioral: Negative.    All other systems reviewed and are negative.   PHYSICAL EXAM: VS:  BP (!) 142/60 (BP Location: Left Arm, Patient Position: Sitting, Cuff Size: Normal)   Pulse (!) 49   Ht '5\' 3"'$  (1.6 m)   Wt 127 lb (57.6 kg)   SpO2 95%   BMI 22.50 kg/m  , BMI Body mass index is 22.5 kg/m. GEN: Well nourished, well developed, in no acute distress HEENT: normal Neck: no JVD, carotid bruits, or masses Cardiac: RRR; 8-9/3 systolic murmur left sternal border No rubs, or gallops,no edema  Respiratory:  clear to auscultation bilaterally, normal work of breathing GI: soft, nontender, nondistended, + BS MS: no deformity or atrophy Skin: warm and dry, no rash Neuro:  Strength and sensation are intact Psych: euthymic mood, full affect   Recent Labs: 12/19/2021: ALT 14; TSH 2.72 01/31/2022: B Natriuretic Peptide 501.6; Hemoglobin 10.7; Platelets 283 02/22/2022: BUN  26; Creatinine, Ser 0.85; Potassium 4.2; Sodium 141    Lipid Panel Lab Results  Component Value Date   CHOL 141 12/19/2021   HDL 50.20 12/19/2021   LDLCALC 77 12/19/2021   TRIG 68.0 12/19/2021      Wt Readings from Last 3 Encounters:  03/01/22 127 lb (57.6 kg)  02/06/22 124 lb 4 oz (56.4 kg)  01/31/22 127 lb 9.6 oz (57.9 kg)       ASSESSMENT AND PLAN:  Problem List Items Addressed This Visit       Cardiology Problems   Severe pulmonary hypertension (HCC)   Atrial fibrillation (HCC) - Primary   Essential hypertension   Carotid artery disease (HCC)   HYPERLIPIDEMIA     Other   SOB (shortness of breath)   Other Visit Diagnoses     Mitral valve insufficiency, unspecified etiology         Persistent atrial fibrillation Continues in sinus rhythm EKG today confirming normal sinus rhythm Recommend she continue amiodarone 200 mg daily with Eliquis and metoprolol Recommend she decrease metoprolol succinate down to 12.5 daily given bradycardia  Mitral valve disorder Detailed as severe back in 2020 and 2022 Also with tricuspid valve regurgitation Repeat echocardiogram done through our office detailing moderate MR  Pulmonary hypertension Detailed on prior echocardiograms Again confirmed on recent echocardiogram pressures 60 or higher Recommend she increase Lasix up to 20 twice daily daily not alternating with daily Daughter interested in further discussions for management of pulmonary hypertension with advanced heart failure clinic Will hold off on initiating SGLT2 inhibitor   Total encounter time more than 40 minutes  Greater than 50% was spent in counseling and coordination of care with the patient    Signed, Esmond Plants, M.D., Ph.D. [place], [place]

## 2022-03-01 ENCOUNTER — Encounter: Payer: Self-pay | Admitting: Cardiovascular Disease

## 2022-03-01 ENCOUNTER — Ambulatory Visit: Payer: Medicare Other | Attending: Medical | Admitting: Cardiovascular Disease

## 2022-03-01 VITALS — BP 142/60 | HR 49 | Ht 63.0 in | Wt 127.0 lb

## 2022-03-01 DIAGNOSIS — I6523 Occlusion and stenosis of bilateral carotid arteries: Secondary | ICD-10-CM | POA: Diagnosis not present

## 2022-03-01 DIAGNOSIS — I10 Essential (primary) hypertension: Secondary | ICD-10-CM | POA: Insufficient documentation

## 2022-03-01 DIAGNOSIS — E782 Mixed hyperlipidemia: Secondary | ICD-10-CM | POA: Diagnosis not present

## 2022-03-01 DIAGNOSIS — I4819 Other persistent atrial fibrillation: Secondary | ICD-10-CM | POA: Insufficient documentation

## 2022-03-01 DIAGNOSIS — I272 Pulmonary hypertension, unspecified: Secondary | ICD-10-CM | POA: Insufficient documentation

## 2022-03-01 DIAGNOSIS — R0602 Shortness of breath: Secondary | ICD-10-CM | POA: Diagnosis not present

## 2022-03-01 DIAGNOSIS — I340 Nonrheumatic mitral (valve) insufficiency: Secondary | ICD-10-CM | POA: Diagnosis not present

## 2022-03-01 MED ORDER — FUROSEMIDE 20 MG PO TABS: 20.0000 mg | ORAL_TABLET | Freq: Two times a day (BID) | ORAL | 2 refills | Status: DC

## 2022-03-01 MED ORDER — METOPROLOL SUCCINATE ER 25 MG PO TB24: 12.5000 mg | ORAL_TABLET | Freq: Every day | ORAL | 1 refills | Status: DC

## 2022-03-01 NOTE — Patient Instructions (Addendum)
Referral to Advanced heart failure clinic/Dr. Pierre Bali at [facility]  Medication Instructions:  Please increase the lasix up to 20 twice a day every day Please decrease the metoprolol to 12.5 mg (1/2 tablet) daily  If you need a refill on your cardiac medications before your next appointment, please call your pharmacy.   Lab work: No new labs needed  Testing/Procedures: No new testing needed  Follow-Up: At [facility], you and your health needs are our priority.  As part of our continuing mission to provide you with exceptional heart care, we have created designated Provider Care Teams.  These Care Teams include your primary Cardiologist (physician) and Advanced Practice Providers (APPs -  Physician Assistants and Nurse Practitioners) who all work together to provide you with the care you need, when you need it.  You will need a follow up appointment in 6 months  Providers on your designated Care Team:   Murray Hodgkins, NP Christell Faith, PA-C Cadence Kathlen Mody, [place]  COVID-19 Vaccine Information can be found at: ShippingScam.co.uk For questions related to vaccine distribution or appointments, please email [email or web address] or call [phone].

## 2022-03-03 DIAGNOSIS — H35373 Puckering of macula, bilateral: Secondary | ICD-10-CM | POA: Diagnosis not present

## 2022-03-03 DIAGNOSIS — J3089 Other allergic rhinitis: Secondary | ICD-10-CM | POA: Diagnosis not present

## 2022-03-06 ENCOUNTER — Encounter: Payer: Self-pay | Admitting: Pulmonary Disease

## 2022-03-09 DIAGNOSIS — J3089 Other allergic rhinitis: Secondary | ICD-10-CM | POA: Diagnosis not present

## 2022-03-10 ENCOUNTER — Encounter: Payer: Self-pay | Admitting: Internal Medicine

## 2022-03-10 ENCOUNTER — Ambulatory Visit: Payer: Medicare Other | Attending: Internal Medicine | Admitting: Internal Medicine

## 2022-03-10 ENCOUNTER — Other Ambulatory Visit
Admission: RE | Admit: 2022-03-10 | Discharge: 2022-03-10 | Disposition: A | Discharge status: Home / Self Care | Payer: Medicare Other | Source: Ambulatory Visit | Attending: Internal Medicine | Admitting: Internal Medicine

## 2022-03-10 ENCOUNTER — Other Ambulatory Visit (HOSPITAL_COMMUNITY): Payer: Self-pay

## 2022-03-10 VITALS — BP 140/60 | HR 60 | Wt 125.4 lb

## 2022-03-10 DIAGNOSIS — I272 Pulmonary hypertension, unspecified: Secondary | ICD-10-CM | POA: Diagnosis not present

## 2022-03-10 DIAGNOSIS — I10 Essential (primary) hypertension: Secondary | ICD-10-CM

## 2022-03-10 DIAGNOSIS — I5032 Chronic diastolic (congestive) heart failure: Secondary | ICD-10-CM

## 2022-03-10 LAB — BASIC METABOLIC PANEL
Anion gap: 8 (ref 5–15)
BUN: 27 mg/dL — ABNORMAL HIGH (ref 8–23)
CO2: 25 mmol/L (ref 22–32)
Calcium: 8.9 mg/dL (ref 8.9–10.3)
Chloride: 106 mmol/L (ref 98–111)
Creatinine, Ser: 0.87 mg/dL (ref 0.44–1.00)
GFR, Estimated: >60 mL/min (ref >60)
Glucose, Bld: 116 mg/dL — ABNORMAL HIGH (ref 70–99)
Potassium: 4.3 mmol/L (ref 3.5–5.1)
Sodium: 139 mmol/L (ref 135–145)

## 2022-03-10 LAB — BRAIN NATRIURETIC PEPTIDE: B Natriuretic Peptide: 383.5 pg/mL — ABNORMAL HIGH (ref 0.0–100.0)

## 2022-03-10 MED ORDER — EMPAGLIFLOZIN 10 MG PO TABS: 10.0000 mg | ORAL_TABLET | Freq: Every day | ORAL | 6 refills | Status: DC

## 2022-03-10 MED ORDER — POTASSIUM CHLORIDE CRYS ER 20 MEQ PO TBCR: 20.0000 meq | EXTENDED_RELEASE_TABLET | Freq: Every day | ORAL | 1 refills | Status: DC

## 2022-03-10 MED ORDER — FUROSEMIDE 20 MG PO TABS: 20.0000 mg | ORAL_TABLET | Freq: Every day | ORAL | 2 refills | Status: DC

## 2022-03-10 NOTE — Patient Instructions (Signed)
Medication Changes:  STOP Metoprolol  START Jardiance 10 mg Daily  Your provider has prescribed Jardiance for you. Please be aware the most common side effect of this medication is urinary tract infections and yeast infections. Please practice good hygiene and keep this area clean and dry to help prevent this. If you do begin to have symptoms of these infections, such as difficulty urinating or painful urination,  please let us know.  DECREASE Furosemide to 20 mg Daily DECREASE Potassium to 20 meq Daily  Lab Work:  Labs needed today, we will call you for abnormal results  Testing/Procedures:  Your physician has recommended that you have a pulmonary function test. Pulmonary Function Tests are a group of tests that measure how well air moves in and out of your lungs.   Referrals:  none  Special Instructions // Education:  Your physician has requested that you regularly monitor and record your blood pressure readings at home. Please use the same machine at the same time of day to check your readings and record them to bring to your follow-up visit.   Follow-Up in: 1-2 months    If you have any questions or concerns before your next appointment please send [place] a message through [place] or call our office at [phone] Monday-Friday 8 am-5 pm.   If you have an urgent need after hours on the weekend please call your Primary Cardiologist or the [facility] in [place] at [phone].

## 2022-03-10 NOTE — Progress Notes (Signed)
ADVANCED HF CLINIC CONSULT NOTE  Referring Physician: Dr. Rockey Rogers Primary Care: Melissa Carbon, MD Primary Cardiologist: Dr. Lise Rogers   HPI:  [name] is a 82 year old woman with chronic AF, HTN, diastolic HF, moderate MR. PAD referred by Dr. Rockey Rogers for further evaluation of her pulmonary HTN   Previously followed at [facility]. Previous echos with normal systolic function and severe MR. Transferred care to Dr. Rockey Rogers in 2023.   Developed SOB in 11/23. Saw Dr. Silvio Rogers. Felt to have HF. CXR showed pulmonary edema.   Saw Dr. Duwayne Rogers and walk test on 01/31/22 with sats in the 80s. Did ONOX with sats down to 75% Started on nighttime O2 and lasix and Kcl. BNP 501   Saw Dr. Rockey Rogers on 02/06/22 and 03/01/21. Lasix increased to 20 bid. Lopressor decreased.  Echo 12/23 EF 60-65% LA moderately dilated. Moderate MR. G3 DD. RV normal. RVSP 27m HG  Non smoker. Husband was a smoker. No h/o DVT/PE. No h/o connective tissue disorder.  Here with her daughter. Feels much better. Breathing better. Has h/o HTN with SBP 140-150s in past. No known snoring. LE edema resolved.     Review of Systems: [y] = yes, '[ ]'$  = no   General: Weight gain '[ ]'$ ; Weight loss '[ ]'$ ; Anorexia '[ ]'$ ; Fatigue [ y]; Fever '[ ]'$ ; Chills '[ ]'$ ; Weakness '[ ]'$   Cardiac: Chest pain/pressure '[ ]'$ ; Resting SOB '[ ]'$ ; Exertional SOB [Blue.Reese]; Orthopnea '[ ]'$ ; Pedal Edema '[ ]'$ ; Palpitations '[ ]'$ ; Syncope '[ ]'$ ; Presyncope '[ ]'$ ; Paroxysmal nocturnal dyspnea'[ ]'$   Pulmonary: Cough '[ ]'$ ; Wheezing'[ ]'$ ; Hemoptysis'[ ]'$ ; Sputum '[ ]'$ ; Snoring '[ ]'$   GI: Vomiting'[ ]'$ ; Dysphagia'[ ]'$ ; Melena'[ ]'$ ; Hematochezia '[ ]'$ ; Heartburn'[ ]'$ ; Abdominal pain '[ ]'$ ; Constipation '[ ]'$ ; Diarrhea '[ ]'$ ; BRBPR '[ ]'$   GU: Hematuria'[ ]'$ ; Dysuria '[ ]'$ ; Nocturia'[ ]'$   Vascular: Pain in legs with walking '[ ]'$ ; Pain in feet with lying flat '[ ]'$ ; Non-healing sores '[ ]'$ ; Stroke '[ ]'$ ; TIA '[ ]'$ ; Slurred speech '[ ]'$ ;  Neuro: Headaches'[ ]'$ ; Vertigo'[ ]'$ ; Seizures'[ ]'$ ; Paresthesias'[ ]'$ ;Blurred vision '[ ]'$ ; Diplopia '[ ]'$ ; Vision  changes '[ ]'$   Ortho/Skin: Arthritis [ y]; Joint pain [Blue.Reese]; Muscle pain '[ ]'$ ; Joint swelling '[ ]'$ ; Back Pain '[ ]'$ ; Rash '[ ]'$   Psych: Depression'[ ]'$ ; Anxiety'[ ]'$   Heme: Bleeding problems '[ ]'$ ; Clotting disorders '[ ]'$ ; Anemia '[ ]'$   Endocrine: Diabetes '[ ]'$ ; Thyroid dysfunction'[ ]'$    Past Medical History:  Diagnosis Date   Adenomatous colon polyp 2005   Allergy    perennial ; shots from Dr LVelora Heckler  Aortic insufficiency    Hyperlipidemia    Hypertension    Murmur    a.  04/2011 Echo:  EF 55-60%, mild-mod ai/mr   Squamous cell carcinoma of skin 08/05/2020   left pretibia EDC   [place]     Current Outpatient Medications  Medication Sig Dispense Refill   amiodarone (PACERONE) 200 MG tablet Take 1 tablet (200 mg total) by mouth daily. 90 tablet 1   amLODipine (NORVASC) 5 MG tablet Take 1 tablet (5 mg total) by mouth daily. 90 tablet 1   apixaban (ELIQUIS) 2.5 MG TABS tablet Take 1 tablet (2.5 mg total) by mouth 2 (two) times daily. 60 tablet 3   azelastine (ASTELIN) 0.1 % nasal spray Place 1 spray into both nostrils 2 (two) times daily. Use in each nostril as directed 30 mL 5   EPINEPHrine 0.3 mg/0.3 mL  IJ SOAJ injection Inject 0.3 mg into the muscle as needed for anaphylaxis.     famotidine (PEPCID) 20 MG tablet Take 20 mg by mouth daily.     fexofenadine (ALLEGRA) 180 MG tablet Take 180 mg by mouth daily.     furosemide (LASIX) 20 MG tablet Take 1 tablet (20 mg total) by mouth 2 (two) times daily. Take 1 tablet (20 mg) twice every other day alternating with 1 tablet (20 mg) once every other day 180 tablet 2   glucosamine-chondroitin 500-400 MG tablet Take 1 tablet by mouth 2 (two) times daily. 1 pm     Krill Oil 500 MG CAPS Take by mouth.     metoprolol succinate (TOPROL-XL) 25 MG 24 hr tablet Take 0.5 tablets (12.5 mg total) by mouth daily. Take 1 tablet (25 mg) by mouth once daily 45 tablet 1   Multiple Vitamin (MULTIVITAMIN) capsule Take 1 capsule by mouth daily.     potassium  chloride SA (KLOR-CON M) 20 MEQ tablet Take 1 tablet (20 meq) twice every other day alternating with 1 tablet (20 meq) once every other day (Patient taking differently: 20 mEq 2 (two) times daily. Take 1 tablet (20 meq) twice every other day alternating with 1 tablet (20 meq) once every other day) 135 tablet 1   pravastatin (PRAVACHOL) 40 MG tablet TAKE 1 TABLET BY MOUTH EVERYDAY AT BEDTIME 90 tablet 3   prednisoLONE acetate (PRED FORTE) 1 % ophthalmic suspension Apply to eye.     sertraline (ZOLOFT) 25 MG tablet TAKE 1 TABLET BY MOUTH EVERY DAY 100 tablet 3   sodium chloride (MURO 128) 5 % ophthalmic solution PLACE 1 DROP INTO RIGHT EYE 4 TIMES A DAY     triamcinolone (NASACORT ALLERGY 24HR) 55 MCG/ACT AERO nasal inhaler Place 2 sprays into the nose daily.     valsartan (DIOVAN) 160 MG tablet Take 1 tablet (160 mg total) by mouth 2 (two) times daily. 180 tablet 1   vitamin B-12 (CYANOCOBALAMIN) 500 MCG tablet Take 500 mcg by mouth daily.     No current facility-administered medications for this visit.    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  Tobacco Use   Smoking status: Never    Passive exposure: Past   Smokeless tobacco: Never  Vaping Use   Vaping Use: Never used  Substance and Sexual Activity   Alcohol use: Yes    Comment: special events   Drug use: No   Sexual activity: Never  Other Topics Concern   Not on file  Social  History Narrative   Widowed 12/19      Has living will   Children now would be health care POA   Would accept resuscitation but no prolonged ventilation   No tube feeds if cognitively unaware      2 daughters, 4 grandsons 1 great-grandson  Retired Medical sales representative from [employer] Strain: Not on [employer] Insecurity: Not on file  Transportation Needs: Not on file  Physical Activity: Not on file  Stress: Not on file  Social Connections: Not on file  Intimate Partner Violence: Not on file      Family History  Problem Relation Age of Onset   Stroke Mother 66   Heart failure Mother    Heart attack Father 73   Lung cancer Father    Cancer Maternal Aunt         X 2; Gyn & ? primary   Diabetes Maternal Grandmother    Esophageal cancer Neg Hx    Colon cancer Neg Hx    Stomach cancer Neg Hx    Rectal cancer Neg Hx     Vitals:   03/10/22 1157  BP: (!) 140/60  Pulse: 60  SpO2: 93%  Weight: 125 lb 6.4 oz (56.9 kg)    PHYSICAL EXAM: General:  Elderly No respiratory difficulty HEENT: normal Neck: supple. JVP 10 with prominent v waves. Carotids 2+ bilat; no bruits. No lymphadenopathy or thryomegaly appreciated. Cor: PMI nondisplaced. Regular rate & rhythm. 2/6 AS 2/6 MR Lungs: clear Abdomen: soft, nontender, nondistended. No hepatosplenomegaly. No bruits or masses. Good bowel sounds. Extremities: no cyanosis, clubbing, rash, edema Neuro: alert & oriented x 3, cranial nerves grossly intact. moves all 4 extremities w/o difficulty. Affect pleasant.   ASSESSMENT & PLAN:   1. 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 RV is normal - mainstay of therapy is volume control and BP management - NYHA II - Cut lasix to 20 daily. - start Jardiance 10 - Consider spiro in future - Check PFTs with DLCO - keep BP log - discussed possible RHC but will defer for now as I doubt it  will change management at this point. Can reconsider next visit to reassess volume status  2. Diastolic HF - plan as above  3.. Paroxysmal AF - In NSR today. Continue amio and Eliquis per Dr. Rockey Rogers  - HR low. Can stop lopressor.   4. Hypoxemia - followed by Dr. Duwayne Rogers - likely multifactorial - continue diuresis - Check PFTs - will need sleep study  5. HTN - keep BP log  Glori Bickers, MD  3:14 PM

## 2022-03-14 DIAGNOSIS — J301 Allergic rhinitis due to pollen: Secondary | ICD-10-CM | POA: Diagnosis not present

## 2022-03-14 DIAGNOSIS — J3081 Allergic rhinitis due to animal (cat) (dog) hair and dander: Secondary | ICD-10-CM | POA: Diagnosis not present

## 2022-03-14 DIAGNOSIS — J3089 Other allergic rhinitis: Secondary | ICD-10-CM | POA: Diagnosis not present

## 2022-03-15 ENCOUNTER — Ambulatory Visit (INDEPENDENT_AMBULATORY_CARE_PROVIDER_SITE_OTHER): Payer: Medicare Other | Admitting: Dermatology

## 2022-03-15 VITALS — BP 151/69 | HR 57

## 2022-03-15 DIAGNOSIS — L578 Other skin changes due to chronic exposure to nonionizing radiation: Secondary | ICD-10-CM

## 2022-03-15 DIAGNOSIS — Z1283 Encounter for screening for malignant neoplasm of skin: Secondary | ICD-10-CM

## 2022-03-15 DIAGNOSIS — L821 Other seborrheic keratosis: Secondary | ICD-10-CM

## 2022-03-15 DIAGNOSIS — L814 Other melanin hyperpigmentation: Secondary | ICD-10-CM | POA: Diagnosis not present

## 2022-03-15 DIAGNOSIS — L820 Inflamed seborrheic keratosis: Secondary | ICD-10-CM

## 2022-03-15 DIAGNOSIS — Z85828 Personal history of other malignant neoplasm of skin: Secondary | ICD-10-CM | POA: Diagnosis not present

## 2022-03-15 DIAGNOSIS — Z8673 Personal history of transient ischemic attack (TIA), and cerebral infarction without residual deficits: Secondary | ICD-10-CM | POA: Diagnosis not present

## 2022-03-15 DIAGNOSIS — Z8589 Personal history of malignant neoplasm of other organs and systems: Secondary | ICD-10-CM

## 2022-03-15 DIAGNOSIS — D229 Melanocytic nevi, unspecified: Secondary | ICD-10-CM

## 2022-03-15 NOTE — Progress Notes (Signed)
Follow-Up Visit   Subjective  [name] is a 82 y.o. female who presents for the following: Annual Exam (Hx SCC). The patient presents for Total-Body Skin Exam (TBSE) for skin cancer screening and mole check.  The patient has spots, moles and lesions to be evaluated, some may be new or changing and the patient has concerns that these could be cancer.  The following portions of the chart were reviewed this encounter and updated as appropriate:   Tobacco  Allergies  Meds  Problems  Med Hx  Surg Hx  Fam Hx     Review of Systems:  No other skin or systemic complaints except as noted in HPI or Assessment and Plan.  Objective  Well appearing patient in no apparent distress; mood and affect are within normal limits.  A full examination was performed including scalp, head, eyes, ears, nose, lips, neck, chest, axillae, abdomen, back, buttocks, bilateral upper extremities, bilateral lower extremities, hands, feet, fingers, toes, fingernails, and toenails. All findings within normal limits unless otherwise noted below.  R upper eyelid margin x 1 Erythematous stuck-on, waxy papule or plaque   Assessment & Plan  Inflamed seborrheic keratosis R upper eyelid margin x 1 Symptomatic, irritating, patient would like treated. Destruction of lesion - R upper eyelid margin x 1 Complexity: simple   Destruction method: cryotherapy   Informed consent: discussed and consent obtained   Timeout:  patient name, date of birth, surgical site, and procedure verified Lesion destroyed using liquid nitrogen: Yes   Region frozen until ice ball extended beyond lesion: Yes   Outcome: patient tolerated procedure well with no complications   Post-procedure details: wound care instructions given    Lentigines - Scattered tan macules - Due to sun exposure - Benign-appearing, observe - Recommend daily broad spectrum sunscreen SPF 30+ to sun-exposed areas, reapply every 2 hours as needed. - Call for any  changes  Seborrheic Keratoses - Stuck-on, waxy, tan-brown papules and/or plaques  - Benign-appearing - Discussed benign etiology and prognosis. - Observe - Call for any changes  Melanocytic Nevi - Tan-brown and/or pink-flesh-colored symmetric macules and papules - Benign appearing on exam today - Observation - Call clinic for new or changing moles - Recommend daily use of broad spectrum spf 30+ sunscreen to sun-exposed areas.   Hemangiomas - Red papules - Discussed benign nature - Observe - Call for any changes  Actinic Damage - Chronic condition, secondary to cumulative UV/sun exposure - diffuse scaly erythematous macules with underlying dyspigmentation - Recommend daily broad spectrum sunscreen SPF 30+ to sun-exposed areas, reapply every 2 hours as needed.  - Staying in the shade or wearing long sleeves, sun glasses (UVA+UVB protection) and wide brim hats (4-inch brim around the entire circumference of the hat) are also recommended for sun protection.  - Call for new or changing lesions.  History of Squamous Cell Carcinoma of the Skin - No evidence of recurrence today - No lymphadenopathy - Recommend regular full body skin exams - Recommend daily broad spectrum sunscreen SPF 30+ to sun-exposed areas, reapply every 2 hours as needed.  - Call if any new or changing lesions are noted between office visits  Varicose Veins/Spider Veins - Dilated blue, purple or red veins at the lower extremities - Reassured - Smaller vessels can be treated by sclerotherapy (a procedure to inject a medicine into the veins to make them disappear) if desired, but the treatment is not covered by insurance. Larger vessels may be covered if symptomatic and we would  refer to vascular surgeon if treatment desired.  Xerosis - diffuse xerotic patches - recommend gentle, hydrating skin care - gentle skin care handout given  Skin cancer screening performed today.  Return in about 1 year (around  03/16/2023) for TBSE.  Luther Redo, CMA, am acting as scribe for Sarina Ser, MD . Documentation: I have reviewed the above documentation for accuracy and completeness, and I agree with the above.  Sarina Ser, MD

## 2022-03-15 NOTE — Patient Instructions (Signed)
Due to recent changes in healthcare laws, you may see results of your pathology and/or laboratory studies on MyChart before the doctors have had a chance to review them. We understand that in some cases there may be results that are confusing or concerning to you. Please understand that not all results are received at the same time and often the doctors may need to interpret multiple results in order to provide you with the best plan of care or course of treatment. Therefore, we ask that you please give us 2 business days to thoroughly review all your results before contacting the office for clarification. Should we see a critical lab result, you will be contacted sooner.   If You Need Anything After Your Visit  If you have any questions or concerns for your doctor, please call our main line at [phone] and press option 4 to reach your doctor's medical assistant. If no one answers, please leave a voicemail as directed and we will return your call as soon as possible. Messages left after 4 pm will be answered the following business day.   You may also send us a message via MyChart. We typically respond to MyChart messages within 1-2 business days.  For prescription refills, please ask your pharmacy to contact our office. Our fax number is [phone].  If you have an urgent issue when the clinic is closed that cannot wait until the next business day, you can page your doctor at the number below.    Please note that while we do our best to be available for urgent issues outside of office hours, we are not available 24/7.   If you have an urgent issue and are unable to reach us, you may choose to seek medical care at your doctor's office, retail clinic, urgent care center, or emergency room.  If you have a medical emergency, please immediately call 911 or go to the emergency department.  Pager Numbers  - Dr. Kowalski: [phone]  - Dr. Moye: [phone]  - Dr. Stewart:  [phone]  In the event of inclement weather, please call our main line at [phone] for an update on the status of any delays or closures.  Dermatology Medication Tips: Please keep the boxes that topical medications come in in order to help keep track of the instructions about where and how to use these. Pharmacies typically print the medication instructions only on the boxes and not directly on the medication tubes.   If your medication is too expensive, please contact our office at [phone] option 4 or send us a message through MyChart.   We are unable to tell what your co-pay for medications will be in advance as this is different depending on your insurance coverage. However, we may be able to find a substitute medication at lower cost or fill out paperwork to get insurance to cover a needed medication.   If a prior authorization is required to get your medication covered by your insurance company, please allow us 1-2 business days to complete this process.  Drug prices often vary depending on where the prescription is filled and some pharmacies may offer cheaper prices.  The website www.goodrx.com contains coupons for medications through different pharmacies. The prices here do not account for what the cost may be with help from insurance (it may be cheaper with your insurance), but the website can give you the price if you did not use any insurance.  - You can print the associated coupon and take it with   your prescription to the pharmacy.  - You may also stop by our office during regular business hours and pick up a GoodRx coupon card.  - If you need your prescription sent electronically to a different pharmacy, notify our office through [facility] MyChart or by phone at [phone] option 4.     Si Usted Necesita Algo Despus de Su Visita  Tambin puede enviarnos un mensaje a travs de MyChart. Por lo general respondemos a los mensajes de MyChart en el transcurso de 1 a 2  das hbiles.  Para renovar recetas, por favor pida a su farmacia que se ponga en contacto con nuestra oficina. Nuestro nmero de fax es el [phone].  Si tiene un asunto urgente cuando la clnica est cerrada y que no puede esperar hasta el siguiente da hbil, puede llamar/localizar a su doctor(a) al nmero que aparece a continuacin.   Por favor, tenga en cuenta que aunque hacemos todo lo posible para estar disponibles para asuntos urgentes fuera del horario de oficina, no estamos disponibles las 24 horas del da, los 7 das de la semana.   Si tiene un problema urgente y no puede comunicarse con nosotros, puede optar por buscar atencin mdica  en el consultorio de su doctor(a), en una clnica privada, en un centro de atencin urgente o en una sala de emergencias.  Si tiene una emergencia mdica, por favor llame inmediatamente al 911 o vaya a la sala de emergencias.  Nmeros de bper  - Dr. Kowalski: [phone]  - Dra. Moye: [phone]  - Dra. Stewart: [phone]  En caso de inclemencias del tiempo, por favor llame a nuestra lnea principal al [phone] para una actualizacin sobre el estado de cualquier retraso o cierre.  Consejos para la medicacin en dermatologa: Por favor, guarde las cajas en las que vienen los medicamentos de uso tpico para ayudarle a seguir las instrucciones sobre dnde y cmo usarlos. Las farmacias generalmente imprimen las instrucciones del medicamento slo en las cajas y no directamente en los tubos del medicamento.   Si su medicamento es muy caro, por favor, pngase en contacto con nuestra oficina llamando al [phone] y presione la opcin 4 o envenos un mensaje a travs de MyChart.   No podemos decirle cul ser su copago por los medicamentos por adelantado ya que esto es diferente dependiendo de la cobertura de su seguro. Sin embargo, es posible que podamos encontrar un medicamento sustituto a menor costo o llenar un formulario para que el  seguro cubra el medicamento que se considera necesario.   Si se requiere una autorizacin previa para que su compaa de seguros cubra su medicamento, por favor permtanos de 1 a 2 das hbiles para completar este proceso.  Los precios de los medicamentos varan con frecuencia dependiendo del lugar de dnde se surte la receta y alguna farmacias pueden ofrecer precios ms baratos.  El sitio web www.goodrx.com tiene cupones para medicamentos de diferentes farmacias. Los precios aqu no tienen en cuenta lo que podra costar con la ayuda del seguro (puede ser ms barato con su seguro), pero el sitio web puede darle el precio si no utiliz ningn seguro.  - Puede imprimir el cupn correspondiente y llevarlo con su receta a la farmacia.  - Tambin puede pasar por nuestra oficina durante el horario de atencin regular y recoger una tarjeta de cupones de GoodRx.  - Si necesita que su receta se enve electrnicamente a una farmacia diferente, informe a nuestra oficina a travs de MyChart de [facility]   o por telfono llamando al [phone] y presione la opcin 4.  

## 2022-03-20 ENCOUNTER — Encounter: Payer: Self-pay | Admitting: Pulmonary Disease

## 2022-03-20 ENCOUNTER — Ambulatory Visit (INDEPENDENT_AMBULATORY_CARE_PROVIDER_SITE_OTHER): Payer: Medicare Other | Admitting: Pulmonary Disease

## 2022-03-20 VITALS — BP 162/66 | HR 62 | Temp 98.0°F | Ht 63.0 in | Wt 123.0 lb

## 2022-03-20 DIAGNOSIS — J9601 Acute respiratory failure with hypoxia: Secondary | ICD-10-CM

## 2022-03-20 DIAGNOSIS — I272 Pulmonary hypertension, unspecified: Secondary | ICD-10-CM | POA: Diagnosis not present

## 2022-03-20 DIAGNOSIS — G4736 Sleep related hypoventilation in conditions classified elsewhere: Secondary | ICD-10-CM

## 2022-03-20 DIAGNOSIS — I340 Nonrheumatic mitral (valve) insufficiency: Secondary | ICD-10-CM | POA: Diagnosis not present

## 2022-03-20 DIAGNOSIS — I480 Paroxysmal atrial fibrillation: Secondary | ICD-10-CM | POA: Diagnosis not present

## 2022-03-20 DIAGNOSIS — I2729 Other secondary pulmonary hypertension: Secondary | ICD-10-CM

## 2022-03-20 NOTE — Patient Instructions (Signed)
You do not need oxygen during the day, you do need to continue your oxygen at nighttime.  We will be on the look out for your breathing tests.  We will see you in follow-up in 3 months time call sooner should any new problems arise.

## 2022-03-20 NOTE — Progress Notes (Signed)
Subjective:    Patient ID: [name], female    DOB: 13-Jan-1941, 82 y.o.   MRN: [id number] Patient Care Team: Venia Carbon, MD as PCP - General (Internal Medicine)  Chief Complaint  Patient presents with   Follow-up    Breathing has improved some. She is using her o2 with sleep.     HPI [name] is an 82 year old lifelong never smoker, albeit with secondhand exposure in the past, and a history as noted below, who presents for follow-up of dyspnea.  Recall she was initially seen here on 31 January 2022 and at that time was seen in more of an acute relation due to severe dyspnea.  For the details of that visit please refer to that note.  We instituted diuretic therapy with potassium supplementation.  Laboratory data at the time showed a BNP of 501.  Severe pulmonary hypertension which had previously been noted (cardiology at [facility]) and mitral valve regurgitation.  Patient had developed atrial fibrillation and is likely worsened her volume status issues.  Since that time she has been followed by cardiology with Dr. Rockey Situ and recently was referred to Dr. Pierre Bali for further evaluation of her pulmonary hypertension.  He has PFTs ordered which is reasonable now that she is better compensated.  The patient does not note any palpitations and no paroxysmal nocturnal dyspnea.  Previously noted orthopnea but this has resolved.  She has not noted any fevers, chills or sweats.  Cough or sputum production, no hemoptysis.    The patient never had the portable source of oxygen delivered because she felt markedly better after starting diuretics.  She is however using nocturnal oxygen and is compliant.  She does not endorse any other symptomatology today.  DATA 12/14/2020 echocardiogram [facility]): LVEF over 55% VH, severe enlargement of the left and right atria.  Moderate to severe MR/TR, severe pulmonary hypertension. 01/26/2022 chest x-ray PA and lateral: Cardiomegaly, interstitial edema,  small bilateral pleural effusions, perihilar bandlike atelectasis. 10 February 2019 echocardiogram: LVEF 60 to 65%, grade II DD, normal RV systolic function RV size normal.  Severely elevated pulmonary artery systolic pressure, RV systolic pressure estimated at 62.8 mm Hg, left atrial size moderately dilated, moderate MR, mild to moderate AR, no AS.  Review of Systems A 10 point review of systems was performed and it is as noted above otherwise negative.     Objective:   Physical Exam BP (!) 162/66 (BP Location: Left Arm, Cuff Size: Normal)   Pulse 62   Temp 98 F (36.7 C) (Oral)   Ht '5\' 3"'$  (1.6 m)   Wt 123 lb (55.8 kg)   SpO2 99% Comment: on RA  BMI 21.79 kg/m   SpO2: 99 % (on RA) O2 Device: None (Room air)  GENERAL: Patient is a well-developed, well-nourished elderly woman in no acute respiratory distress no tachypnea. She is fully ambulatory, no conversational dyspnea per se. HEAD: Normocephalic, atraumatic.  EYES: Pupils equal, round, reactive to light.  No scleral icterus.  MOUTH: Caps and crowns present.  Oral mucosa moist.  No thrush. NECK: Supple. No thyromegaly. Trachea midline. + JVD.  No adenopathy. PULMONARY: Good air entry bilaterally.  No adventitious sounds. CARDIOVASCULAR: S1 and S2.  Currently in NSR with CVR.  AR murmur 2/6, mitral regurgitation murmur 2/6 (previously louder).  ABDOMEN: Mildly protuberant otherwise benign. MUSCULOSKELETAL: No joint deformity, no clubbing, no lower extremity edema.  NEUROLOGIC: Grossly nonfocal. SKIN: Intact,warm,dry. PSYCH: Mood and behavior normal.  Ambulatory oximetry on room air  today: At rest heart rate was 66 bpm, oxygen saturation was 99%.  Patient did not desaturate during exercise reaching a nadir of oxygen no lower than 98%.  Heart rate remained between 66 to 73 bpm.    Assessment & Plan:     ICD-10-CM   1. Acute respiratory failure with hypoxia (HCC) - resolved  J96.01 Ambulatory Referral for DME   Due to  decompensation of congestive heart failure She is currently better compensated Continue follow-up with cardiology    2. Severe pulmonary hypertension (HCC)  I27.20    Being investigated Suspect related to valvular disease and diastolic dysfunction PFTs pending    3. Moderate mitral insufficiency  I34.0    Follows with cardiology This issue adds complexity to her management    4. PAF (paroxysmal atrial fibrillation) (HCC)  I48.0    This issue adds complexity to her management Follows with cardiology    5. Nocturnal hypoxemia due to pulmonary hypertension (HCC)  I27.29    G47.36    Continue oxygen at 2 L/min Patient has been compliant with therapy Notes benefit from therapy     Orders Placed This Encounter  Procedures   Ambulatory Referral for DME    Referral Priority:   Routine    Referral Type:   Durable Medical Equipment Purchase    Number of Visits Requested:   1   We will see the patient in follow-up in 3 months time she is to contact us prior to that time should any new difficulties arise.  We will review her PFTs when these are ready.  Renold Don, MD Advanced Bronchoscopy PCCM [facility]    *This note was dictated using voice recognition software/Dragon.  Despite best efforts to proofread, errors can occur which can change the meaning. Any transcriptional errors that result from this process are unintentional and may not be fully corrected at the time of dictation.

## 2022-03-28 ENCOUNTER — Encounter: Payer: Self-pay | Admitting: Dermatology

## 2022-03-28 ENCOUNTER — Ambulatory Visit: Payer: Medicare Other | Attending: Internal Medicine

## 2022-03-28 DIAGNOSIS — I5032 Chronic diastolic (congestive) heart failure: Secondary | ICD-10-CM | POA: Diagnosis not present

## 2022-03-28 DIAGNOSIS — I272 Pulmonary hypertension, unspecified: Secondary | ICD-10-CM | POA: Diagnosis not present

## 2022-03-28 LAB — PULMONARY FUNCTION TEST ARMC ONLY
DL/VA % pred: 85 %
DL/VA: 3.52 ml/min/mmHg/L
DLCO unc % pred: 60 %
DLCO unc: 10.95 ml/min/mmHg
FEF 25-75 Post: 2 L/sec
FEF 25-75 Pre: 2.08 L/sec
FEF2575-%Change-Post: -3 %
FEF2575-%Pred-Post: 160 %
FEF2575-%Pred-Pre: 166 %
FEV1-%Change-Post: 22 %
FEV1-%Pred-Post: 83 %
FEV1-%Pred-Pre: 68 %
FEV1-Post: 1.48 L
FEV1-Pre: 1.21 L
FEV1FVC-%Change-Post: 3 %
FEV1FVC-%Pred-Pre: 122 %
FEV6-%Change-Post: 18 %
FEV6-%Pred-Post: 70 %
FEV6-%Pred-Pre: 59 %
FEV6-Post: 1.6 L
FEV6-Pre: 1.35 L
FEV6FVC-%Pred-Post: 106 %
FEV6FVC-%Pred-Pre: 106 %
FVC-%Change-Post: 18 %
FVC-%Pred-Post: 66 %
FVC-%Pred-Pre: 56 %
FVC-Post: 1.6 L
FVC-Pre: 1.35 L
Post FEV1/FVC ratio: 93 %
Post FEV6/FVC ratio: 100 %
Pre FEV1/FVC ratio: 90 %
Pre FEV6/FVC Ratio: 100 %
RV % pred: 71 %
RV: 1.69 L
TLC % pred: 63 %
TLC: 3.1 L

## 2022-03-28 MED ORDER — ALBUTEROL SULFATE (2.5 MG/3ML) 0.083% IN NEBU
2.5000 mg | INHALATION_SOLUTION | Freq: Once | RESPIRATORY_TRACT | Status: AC
  Administered 2022-03-28: 2.5 mg via RESPIRATORY_TRACT
  Filled 2022-03-28: qty 3

## 2022-04-04 DIAGNOSIS — J301 Allergic rhinitis due to pollen: Secondary | ICD-10-CM | POA: Diagnosis not present

## 2022-04-04 DIAGNOSIS — H1045 Other chronic allergic conjunctivitis: Secondary | ICD-10-CM | POA: Diagnosis not present

## 2022-04-04 DIAGNOSIS — J3081 Allergic rhinitis due to animal (cat) (dog) hair and dander: Secondary | ICD-10-CM | POA: Diagnosis not present

## 2022-04-04 DIAGNOSIS — J3089 Other allergic rhinitis: Secondary | ICD-10-CM | POA: Diagnosis not present

## 2022-04-11 ENCOUNTER — Telehealth: Payer: Self-pay | Admitting: Pulmonary Disease

## 2022-04-11 NOTE — Telephone Encounter (Signed)
I spoke with the patient, she said [employer] told her if she did not make an appointment before her original date her insurance would not pay for her O2.  I have left a message with Melissa from [employer] to confirm this.

## 2022-04-12 NOTE — Telephone Encounter (Signed)
I have left another message for [name] to return my call.

## 2022-04-14 NOTE — Telephone Encounter (Signed)
I spoke with [name], she is looking into it and will call me back.

## 2022-04-18 ENCOUNTER — Encounter: Payer: Self-pay | Admitting: Internal Medicine

## 2022-04-18 ENCOUNTER — Other Ambulatory Visit
Admission: RE | Admit: 2022-04-18 | Discharge: 2022-04-18 | Disposition: A | Discharge status: Home / Self Care | Payer: Medicare Other | Source: Ambulatory Visit | Attending: Internal Medicine | Admitting: Internal Medicine

## 2022-04-18 ENCOUNTER — Ambulatory Visit (HOSPITAL_BASED_OUTPATIENT_CLINIC_OR_DEPARTMENT_OTHER): Payer: Medicare Other | Admitting: Internal Medicine

## 2022-04-18 ENCOUNTER — Encounter: Payer: Medicare Other | Admitting: Internal Medicine

## 2022-04-18 VITALS — BP 160/64 | HR 65 | Wt 125.4 lb

## 2022-04-18 DIAGNOSIS — R942 Abnormal results of pulmonary function studies: Secondary | ICD-10-CM

## 2022-04-18 DIAGNOSIS — R0902 Hypoxemia: Secondary | ICD-10-CM | POA: Insufficient documentation

## 2022-04-18 DIAGNOSIS — I110 Hypertensive heart disease with heart failure: Secondary | ICD-10-CM | POA: Insufficient documentation

## 2022-04-18 DIAGNOSIS — I480 Paroxysmal atrial fibrillation: Secondary | ICD-10-CM | POA: Insufficient documentation

## 2022-04-18 DIAGNOSIS — I5032 Chronic diastolic (congestive) heart failure: Secondary | ICD-10-CM | POA: Insufficient documentation

## 2022-04-18 DIAGNOSIS — Z79899 Other long term (current) drug therapy: Secondary | ICD-10-CM | POA: Insufficient documentation

## 2022-04-18 DIAGNOSIS — Z7901 Long term (current) use of anticoagulants: Secondary | ICD-10-CM | POA: Insufficient documentation

## 2022-04-18 DIAGNOSIS — J811 Chronic pulmonary edema: Secondary | ICD-10-CM | POA: Insufficient documentation

## 2022-04-18 DIAGNOSIS — I6523 Occlusion and stenosis of bilateral carotid arteries: Secondary | ICD-10-CM | POA: Diagnosis not present

## 2022-04-18 DIAGNOSIS — I272 Pulmonary hypertension, unspecified: Secondary | ICD-10-CM

## 2022-04-18 LAB — BASIC METABOLIC PANEL
Anion gap: 9 (ref 5–15)
BUN: 20 mg/dL (ref 8–23)
CO2: 30 mmol/L (ref 22–32)
Calcium: 9.3 mg/dL (ref 8.9–10.3)
Chloride: 99 mmol/L (ref 98–111)
Creatinine, Ser: 0.73 mg/dL (ref 0.44–1.00)
GFR, Estimated: >60 mL/min (ref >60)
Glucose, Bld: 102 mg/dL — ABNORMAL HIGH (ref 70–99)
Potassium: 3.8 mmol/L (ref 3.5–5.1)
Sodium: 138 mmol/L (ref 135–145)

## 2022-04-18 LAB — BRAIN NATRIURETIC PEPTIDE: B Natriuretic Peptide: 213.3 pg/mL — ABNORMAL HIGH (ref 0.0–100.0)

## 2022-04-18 MED ORDER — AMIODARONE HCL 200 MG PO TABS: 100.0000 mg | ORAL_TABLET | Freq: Every day | ORAL | 1 refills | Status: DC

## 2022-04-18 NOTE — Progress Notes (Signed)
ADVANCED HF CLINIC CONSULT NOTE  Referring Physician: Dr. Rockey Situ Primary Care: Venia Carbon, MD Primary Cardiologist: Dr. Lise Auer   HPI:  [name] is a 82 year old woman with chronic AF, HTN, diastolic HF, moderate MR. PAD referred by Dr. Rockey Situ for further evaluation of her pulmonary HTN   Previously followed at [facility]. Previous echos with normal systolic function and severe MR. Transferred care to Dr. Rockey Situ in 2023.   Developed SOB in 11/23. Saw Dr. Silvio Pate. Felt to have HF. CXR showed pulmonary edema.   Saw Dr. Duwayne Heck and walk test on 01/31/22 with sats in the 80s. Did ONOX with sats down to 75% Started on nighttime O2 and lasix and Kcl. BNP 501   Saw Dr. Rockey Situ on 02/06/22 and 03/01/21. Lasix increased to 20 bid. Lopressor decreased.  Echo 12/23 EF 60-65% LA moderately dilated. Moderate MR. G3 DD. RV normal. RVSP 76m HG  Non smoker. Husband was a smoker. No h/o DVT/PE. No h/o connective tissue disorder.  Here with her daughter. Feels much better. BP at home 115-130 occasional SBP down to 100. Very mild DOE. No edema, orthopnea or PND. Weight stable.   PFTs 03/28/22 FEV1 1.21 (68%) FEVC 1.35 (56%) DLCO 60%  Past Medical History:  Diagnosis Date   Adenomatous colon polyp 2005   Allergy    perennial ; shots from Dr LVelora Heckler  Aortic insufficiency    Hyperlipidemia    Hypertension    Murmur    a.  04/2011 Echo:  EF 55-60%, mild-mod ai/mr   Squamous cell carcinoma of skin 08/05/2020   left pretibia EDC   [place]     Current Outpatient Medications  Medication Sig Dispense Refill   amiodarone (PACERONE) 200 MG tablet Take 1 tablet (200 mg total) by mouth daily. 90 tablet 1   amLODipine (NORVASC) 5 MG tablet Take 1 tablet (5 mg total) by mouth daily. 90 tablet 1   apixaban (ELIQUIS) 2.5 MG TABS tablet Take 1 tablet (2.5 mg total) by mouth 2 (two) times daily. 60 tablet 3   azelastine (ASTELIN) 0.1 % nasal spray Place 1 spray into both  nostrils 2 (two) times daily. Use in each nostril as directed 30 mL 5   empagliflozin (JARDIANCE) 10 MG TABS tablet Take 1 tablet (10 mg total) by mouth daily before breakfast. 30 tablet 6   EPINEPHrine 0.3 mg/0.3 mL IJ SOAJ injection Inject 0.3 mg into the muscle as needed for anaphylaxis.     famotidine (PEPCID) 20 MG tablet Take 20 mg by mouth daily.     fexofenadine (ALLEGRA) 180 MG tablet Take 180 mg by mouth daily.     furosemide (LASIX) 20 MG tablet Take 1 tablet (20 mg total) by mouth daily. 180 tablet 2   glucosamine-chondroitin 500-400 MG tablet Take 1 tablet by mouth 2 (two) times daily. 1 pm     Krill Oil 500 MG CAPS Take by mouth.     Multiple Vitamin (MULTIVITAMIN) capsule Take 1 capsule by mouth daily.     potassium chloride SA (KLOR-CON M) 20 MEQ tablet Take 1 tablet (20 mEq total) by mouth daily. Take 1 tablet (20 meq) twice every other day alternating with 1 tablet (20 meq) once every other day (Patient taking differently: Take 20 mEq by mouth daily. Take once daily with Lasix) 135 tablet 1   pravastatin (PRAVACHOL) 40 MG tablet TAKE 1 TABLET BY MOUTH EVERYDAY AT BEDTIME 90 tablet 3   prednisoLONE acetate (PRED FORTE) 1 %  ophthalmic suspension Apply to eye.     sertraline (ZOLOFT) 25 MG tablet TAKE 1 TABLET BY MOUTH EVERY DAY 100 tablet 3   sodium chloride (MURO 128) 5 % ophthalmic solution PLACE 1 DROP INTO RIGHT EYE 4 TIMES A DAY     triamcinolone (NASACORT ALLERGY 24HR) 55 MCG/ACT AERO nasal inhaler Place 2 sprays into the nose daily.     valsartan (DIOVAN) 160 MG tablet Take 1 tablet (160 mg total) by mouth 2 (two) times daily. 180 tablet 1   vitamin B-12 (CYANOCOBALAMIN) 500 MCG tablet Take 500 mcg by mouth daily.     No current facility-administered medications for this visit.    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  Tobacco Use   Smoking status: Never    Passive exposure: Past   Smokeless tobacco: Never  Vaping Use   Vaping Use: Never used  Substance and Sexual Activity   Alcohol use: Yes    Comment: special events   Drug use: No   Sexual activity: Never  Other Topics Concern   Not on file  Social History Narrative   Widowed 12/19      Has living will   Children now would be health care POA   Would accept resuscitation but no prolonged ventilation   No tube feeds if cognitively unaware      2 daughters, 4 grandsons 1 great-grandson   Retired Medical sales representative from [place] Strain: Not on [employer] Insecurity: Not on file  Transportation Needs: Not on file  Physical Activity: Not on file  Stress: Not on file  Social Connections: Not on file  Intimate Partner Violence: Not on file      Family History  Problem Relation Age of Onset   Stroke Mother 66   Heart failure Mother    Heart attack Father 17   Lung cancer Father    Cancer Maternal Aunt         X 2; Gyn & ? primary   Diabetes Maternal Grandmother    Esophageal cancer Neg Hx    Colon cancer Neg Hx    Stomach cancer Neg Hx    Rectal cancer Neg Hx     Vitals:   04/18/22 1236  BP: (!) 160/64  Pulse: 65  SpO2: 99%  Weight: 125 lb 6.4 oz (56.9 kg)    PHYSICAL EXAM: General:  Elderly No respiratory difficulty HEENT: normal Neck: supple. JVP 5-6 Carotids 2+ bilat; no bruits. No lymphadenopathy or thryomegaly appreciated. Cor: PMI nondisplaced. Regular rate &  rhythm. 2/6 MR and AS Lungs: clear Abdomen: soft, nontender, nondistended. No hepatosplenomegaly. No bruits or masses. Good bowel sounds. Extremities: no cyanosis, clubbing, rash, edema Neuro: alert & orientedx3, cranial nerves grossly intact. moves all 4 extremities w/o difficulty. Affect pleasant  ASSESSMENT & PLAN:   1. 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 RV is normal - mainstay of therapy is volume control and BP management - However, recent PFTs with DLCO show restrictive physiology with a diffusion defect suggestive of interstitial process -> will get hi-res CT to look for ILD (had some lung scarring on cheat CT 2013) - Functional capacity improved with diuresis. NYHA II - Continue lasix 20 daily. - Continue Jardiance 10 - Consider spiro in future - If chest CT c/w ILD will need R/L cath (had significant coronary calcium in 2013) to see if she might be candidate for Tyvaso  2. Diastolic HF - plan as above  3.. Paroxysmal AF - In NSR today. Continue amio and Eliquis - With possible ILD drop amio to 100 daily for now. May need to switch amio if has ILD  4. Hypoxemia - followed by Dr. Duwayne Heck - likely multifactorial - plan as above - will need sleep study  5. HTN - BP high here but well controlled at home  Glori Bickers, MD  1:23 PM

## 2022-04-18 NOTE — Telephone Encounter (Signed)
I left a message for [name] to call back for a follow up on this patient's appt.

## 2022-04-18 NOTE — Patient Instructions (Signed)
Medication Changes:  DECREASE Amiodarone tablet from 200 mg (1 whole tab) daily to 100 mg (1/2 tab) once daily.   Lab Work:  Labs done today, your results will be available in MyChart, we will contact you for abnormal readings.   Testing/Procedures:  High resolution Chest CT scan ordered. We will call you to schedule this once approved by insurance  Referrals:  You have been referred to pulmonary rehabilitation. Someone from their office should reach out to you to schedule an appointment.   Special Instructions // Education:  Do the following things EVERYDAY: Weigh yourself in the morning before breakfast. Write it down and keep it in a log. Take your medicines as prescribed Eat low salt foods--Limit salt (sodium) to 2000 mg per day.  Stay as active as you can everyday Limit all fluids for the day to less than 2 liters   Follow-Up in: 6 weeks with Dr. Haroldine Laws     If you have any questions or concerns before your next appointment please send [place] a message through mychart or call our office at [phone] Monday-Friday 8 am-5 pm.   If you have an urgent need after hours on the weekend please call your Primary Cardiologist or the [facility] in [place] at [phone].

## 2022-04-21 ENCOUNTER — Telehealth: Payer: Self-pay

## 2022-04-21 NOTE — Telephone Encounter (Signed)
Spoke with patient to inform patient high resolution chest CT scan has been scheduled for 05/02/22 at [facility]. Pt instructed to check in at the [facility] at 12:30 pm for a 1:00 pm appointment time. No Precert needed from pt's insurance.  Patient confirmed on the phone that she is independent and should be able to get on CT scan table without issue.  Pt verbalized understanding and had no questions or concerns.

## 2022-04-24 NOTE — Telephone Encounter (Signed)
I received a voicemail from [place], she did verify that the patient's insurance company needs her to have a visit to continue her oxygen.  I have left another message for [name] to verify that her appointment on 4/4 is ok to keep or does she need to be seen sooner.

## 2022-04-25 DIAGNOSIS — J3089 Other allergic rhinitis: Secondary | ICD-10-CM | POA: Diagnosis not present

## 2022-04-25 DIAGNOSIS — J301 Allergic rhinitis due to pollen: Secondary | ICD-10-CM | POA: Diagnosis not present

## 2022-04-25 DIAGNOSIS — J3081 Allergic rhinitis due to animal (cat) (dog) hair and dander: Secondary | ICD-10-CM | POA: Diagnosis not present

## 2022-04-25 NOTE — Telephone Encounter (Signed)
Per [name], April 4th is ok and she does not need to be seen sooner.  I notified the patient.   Nothing further is needed.

## 2022-05-02 ENCOUNTER — Ambulatory Visit
Admission: RE | Admit: 2022-05-02 | Discharge: 2022-05-02 | Disposition: A | Discharge status: Home / Self Care | Payer: Medicare Other | Source: Ambulatory Visit | Attending: Internal Medicine | Admitting: Internal Medicine

## 2022-05-02 DIAGNOSIS — J8410 Pulmonary fibrosis, unspecified: Secondary | ICD-10-CM | POA: Diagnosis not present

## 2022-05-02 DIAGNOSIS — I272 Pulmonary hypertension, unspecified: Secondary | ICD-10-CM

## 2022-05-02 DIAGNOSIS — R918 Other nonspecific abnormal finding of lung field: Secondary | ICD-10-CM | POA: Diagnosis not present

## 2022-05-08 ENCOUNTER — Telehealth: Payer: Self-pay | Admitting: *Deleted

## 2022-05-08 NOTE — Telephone Encounter (Signed)
Pts daughter called stating is complaining of a rash on her neck and around her eyes. Pt took benadryl thinking it was just allergies but the benadryl did not help. Pts daughter asked if this could be a side effect from jardiance. Pt said the rash started two weeks ago but she has been on jardiance for two months.   Routed to [place]

## 2022-05-09 NOTE — Telephone Encounter (Signed)
Pt's daughter called in to see what Dr. Haroldine Laws said regarding rash. I read pt's daughter the message put in by Dr. Haroldine Laws. Pt's daughter acknowledged and nothing further is needed at this time.

## 2022-05-11 ENCOUNTER — Ambulatory Visit (INDEPENDENT_AMBULATORY_CARE_PROVIDER_SITE_OTHER): Payer: Medicare Other | Admitting: Internal Medicine

## 2022-05-11 ENCOUNTER — Encounter: Payer: Self-pay | Admitting: Internal Medicine

## 2022-05-11 VITALS — BP 142/51 | HR 81 | Temp 98.0°F | Ht 63.0 in | Wt 125.0 lb

## 2022-05-11 DIAGNOSIS — I272 Pulmonary hypertension, unspecified: Secondary | ICD-10-CM | POA: Diagnosis not present

## 2022-05-11 DIAGNOSIS — I480 Paroxysmal atrial fibrillation: Secondary | ICD-10-CM

## 2022-05-11 DIAGNOSIS — T7840XA Allergy, unspecified, initial encounter: Secondary | ICD-10-CM | POA: Diagnosis not present

## 2022-05-11 DIAGNOSIS — J301 Allergic rhinitis due to pollen: Secondary | ICD-10-CM

## 2022-05-11 NOTE — Assessment & Plan Note (Signed)
Only new exposure is jardiance---and hypersensitivity reactions are noted with it Continues on her antihistamines for routine issues Will hold off on prednisone since reaction is mild If better--will change to farxiga If persists, will need to speak to allergist--but can go back on jardiance

## 2022-05-11 NOTE — Progress Notes (Signed)
Subjective:    Patient ID: [name], female    DOB: 1941-02-21, 82 y.o.   MRN: [id number]  HPI Here due to a possible allergic reaction With daughter   Overall some better with her SOB from the pulmonary hypertension Last new medication was jardiance on 1/14  On March 3rd---daughter noticed eyes red and puffy Redness and splotchy look around neck Tried benedryl and benedryl cream----slight help at most. Still some itching Now soles of feet red Dr Sung Amabile didn't think it was the jardiance  Is on allergy immunotherapy--every 3 weeks No new pollen or other exposures Thinks the allegra "stopped working". Tried chlorpheniramine for a week or so--may have helped dry her up some Ongoing nasal drainage Uses oxygen at night  Some cough--not necessarily worse. Sometimes at night Breathing is okay Oximetry has been okay  Current Outpatient Medications on File Prior to Visit  Medication Sig Dispense Refill   amiodarone (PACERONE) 200 MG tablet Take 0.5 tablets (100 mg total) by mouth daily. 45 tablet 1   amLODipine (NORVASC) 5 MG tablet Take 1 tablet (5 mg total) by mouth daily. 90 tablet 1   apixaban (ELIQUIS) 2.5 MG TABS tablet Take 1 tablet (2.5 mg total) by mouth 2 (two) times daily. 60 tablet 3   azelastine (ASTELIN) 0.1 % nasal spray Place 1 spray into both nostrils 2 (two) times daily. Use in each nostril as directed 30 mL 5   empagliflozin (JARDIANCE) 10 MG TABS tablet Take 1 tablet (10 mg total) by mouth daily before breakfast. 30 tablet 6   EPINEPHrine 0.3 mg/0.3 mL IJ SOAJ injection Inject 0.3 mg into the muscle as needed for anaphylaxis.     famotidine (PEPCID) 20 MG tablet Take 20 mg by mouth daily.     furosemide (LASIX) 20 MG tablet Take 1 tablet (20 mg total) by mouth daily. 180 tablet 2   glucosamine-chondroitin 500-400 MG tablet Take 1 tablet by mouth 2 (two) times daily. 1 pm     Krill Oil 500 MG CAPS Take by mouth.     Multiple Vitamin (MULTIVITAMIN) capsule  Take 1 capsule by mouth daily.     potassium chloride SA (KLOR-CON M) 20 MEQ tablet Take 1 tablet (20 mEq total) by mouth daily. Take 1 tablet (20 meq) twice every other day alternating with 1 tablet (20 meq) once every other day (Patient taking differently: Take 20 mEq by mouth daily. Take once daily with Lasix) 135 tablet 1   pravastatin (PRAVACHOL) 40 MG tablet TAKE 1 TABLET BY MOUTH EVERYDAY AT BEDTIME 90 tablet 3   prednisoLONE acetate (PRED FORTE) 1 % ophthalmic suspension Apply to eye.     sertraline (ZOLOFT) 25 MG tablet TAKE 1 TABLET BY MOUTH EVERY DAY 100 tablet 3   sodium chloride (MURO 128) 5 % ophthalmic solution PLACE 1 DROP INTO RIGHT EYE 4 TIMES A DAY     triamcinolone (NASACORT ALLERGY 24HR) 55 MCG/ACT AERO nasal inhaler Place 2 sprays into the nose daily.     valsartan (DIOVAN) 160 MG tablet Take 1 tablet (160 mg total) by mouth 2 (two) times daily. 180 tablet 1   vitamin B-12 (CYANOCOBALAMIN) 500 MCG tablet Take 500 mcg by mouth daily.     fexofenadine (ALLEGRA) 180 MG tablet Take 180 mg by mouth daily. (Patient not taking: Reported on 05/11/2022)     No current facility-administered medications on file prior to visit.    Allergies  Allergen Reactions   Amlodipine Besy-Benazepril Hcl Swelling  Lotrel caused swelling of feet & facial swelling unilaterally   Amlodipine Besy-Benazepril Hcl Other (See Comments)   Lotrel [Amlodipine Besy-Benazepril Hcl]    Molds & Smuts Other (See Comments)   Wasp Venom Swelling    Allergic to wasps    Past Medical History:  Diagnosis Date   Adenomatous colon polyp 2005   Allergy    perennial ; shots from Dr Velora Heckler   Aortic insufficiency    Hyperlipidemia    Hypertension    Murmur    a.  04/2011 Echo:  EF 55-60%, mild-mod ai/mr   Squamous cell carcinoma of skin 08/05/2020   left pretibia EDC   Uterine cancer ([place])     Past Surgical History:  Procedure Laterality Date   BLADDER SUSPENSION     CARPAL TUNNEL RELEASE Left     COLONOSCOPY  multiple   GLAUCOMA SURGERY     "for narrow lines" (? narrow angle?)   INTRAOCULAR LENS INSERTION Bilateral 10/22/2014; 11/26/2014   TOTAL ABDOMINAL HYSTERECTOMY W/ BILATERAL SALPINGOOPHORECTOMY     abnormal PAP    Family History  Problem Relation Age of Onset   Stroke Mother 21   Heart failure Mother    Heart attack Father 37   Lung cancer Father    Cancer Maternal Aunt         X 2; Gyn & ? 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  Tobacco Use   Smoking status: Never    Passive exposure: Past   Smokeless tobacco: Never  Vaping Use   Vaping Use: Never used  Substance and Sexual Activity   Alcohol use: Yes    Comment: special events   Drug use: No   Sexual activity: Never  Other Topics Concern   Not on file  Social History Narrative   Widowed 12/19      Has living will   Children now would be health care POA   Would accept resuscitation but no prolonged ventilation   No tube feeds if cognitively unaware      2 daughters, 4 grandsons 1 great-grandson   Retired Medical sales representative from [place] Strain: Not on [employer] Insecurity: Not on file  Transportation Needs: Not on file  Physical Activity: Not on file  Stress: Not on file  Social Connections: Not on file  Intimate Partner Violence: Not on file   Review of Systems No oral lesions Eating okay Back on amiodarone Weighing daily      Objective:   Physical Exam Constitutional:      Appearance: Normal appearance.  Eyes:     Comments: Mild periorbital swelling and  redness  Cardiovascular:     Rate and Rhythm: Normal rate and regular rhythm.     Heart sounds:     No gallop.     Comments: Soft systolic murmur along sternal border Pulmonary:     Effort: Pulmonary effort is normal.     Breath  sounds: Normal breath sounds. No wheezing or rales.     Comments: Slight exp rhonchi Musculoskeletal:     Cervical back: Neck supple.  Lymphadenopathy:     Cervical: No cervical adenopathy.  Skin:    Comments: Slight redness on plantar feet  Neurological:     Mental Status: She is alert.            Assessment & Plan:

## 2022-05-11 NOTE — Assessment & Plan Note (Signed)
Back regular On the amiodarone again---not the cause of her respiratory symptoms On eliquis 2.5 bid

## 2022-05-11 NOTE — Assessment & Plan Note (Signed)
Continues on immunotherapy Switching antihistamines around---okay if no sedation

## 2022-05-11 NOTE — Assessment & Plan Note (Addendum)
Doing better with regimen Will stop the jardiance in case causing reaction----and substitute farxiga if her symptoms resolve off the jardiance Continue the furosemide Will let Dr Sung Amabile know

## 2022-05-12 ENCOUNTER — Encounter: Payer: Self-pay | Admitting: Adult Health

## 2022-05-12 ENCOUNTER — Ambulatory Visit (INDEPENDENT_AMBULATORY_CARE_PROVIDER_SITE_OTHER): Payer: Medicare Other | Admitting: Adult Health

## 2022-05-12 VITALS — BP 124/70 | HR 77 | Temp 97.8°F | Ht 63.0 in | Wt 123.6 lb

## 2022-05-12 DIAGNOSIS — I5032 Chronic diastolic (congestive) heart failure: Secondary | ICD-10-CM

## 2022-05-12 DIAGNOSIS — J4531 Mild persistent asthma with (acute) exacerbation: Secondary | ICD-10-CM | POA: Diagnosis not present

## 2022-05-12 DIAGNOSIS — I270 Primary pulmonary hypertension: Secondary | ICD-10-CM | POA: Diagnosis not present

## 2022-05-12 DIAGNOSIS — J209 Acute bronchitis, unspecified: Secondary | ICD-10-CM | POA: Insufficient documentation

## 2022-05-12 DIAGNOSIS — I272 Pulmonary hypertension, unspecified: Secondary | ICD-10-CM

## 2022-05-12 DIAGNOSIS — J9611 Chronic respiratory failure with hypoxia: Secondary | ICD-10-CM | POA: Diagnosis not present

## 2022-05-12 DIAGNOSIS — R0683 Snoring: Secondary | ICD-10-CM

## 2022-05-12 DIAGNOSIS — J45909 Unspecified asthma, uncomplicated: Secondary | ICD-10-CM | POA: Insufficient documentation

## 2022-05-12 MED ORDER — DOXYCYCLINE HYCLATE 100 MG PO TABS: 100.0000 mg | ORAL_TABLET | Freq: Two times a day (BID) | ORAL | 0 refills | Status: DC

## 2022-05-12 MED ORDER — FLUTICASONE FUROATE-VILANTEROL 100-25 MCG/ACT IN AEPB: 1.0000 | INHALATION_SPRAY | Freq: Every day | RESPIRATORY_TRACT | 5 refills | Status: DC

## 2022-05-12 MED ORDER — PREDNISONE 20 MG PO TABS: 20.0000 mg | ORAL_TABLET | Freq: Every day | ORAL | 0 refills | Status: DC

## 2022-05-12 MED ORDER — ALBUTEROL SULFATE HFA 108 (90 BASE) MCG/ACT IN AERS: 1.0000 | INHALATION_SPRAY | Freq: Four times a day (QID) | RESPIRATORY_TRACT | 2 refills | Status: DC | PRN

## 2022-05-12 MED ORDER — BENZONATATE 200 MG PO CAPS: 200.0000 mg | ORAL_CAPSULE | Freq: Three times a day (TID) | ORAL | 1 refills | Status: DC | PRN

## 2022-05-12 NOTE — Progress Notes (Signed)
@Patient  ID: [name], female    DOB: 26-Aug-1940, 82 y.o.   MRN: [id number]  Chief Complaint  Patient presents with   Acute Visit    Referring provider: Venia Carbon, MD  HPI: 82 year old never smoker seen for pulmonary consult December 2023 for cough and shortness of breath and chronic respiratory failure  Medical history significant for severe pulmonary hypertension, moderate  mitral valve regurgitation and tricuspid valve regurgitation, atrial fib on amiodarone, congestive heart failure  TEST/EVENTS :  12/14/2020 echocardiogram [facility]): LVEF over 55% VH, severe enlargement of the left and right atria.  Moderate to severe MR/TR, severe pulmonary hypertension. 01/26/2022 chest x-ray PA and lateral: Cardiomegaly, interstitial edema, small bilateral pleural effusions, perihilar bandlike atelectasis.  2D echo February 09, 2022 EF at [id number], grade 2 diastolic dysfunction, severely elevated pulmonary artery systolic pressure, moderate mitral valve regurgitation, no evidence of mitral stenosis, mild tricuspid valve regurg  05/12/2022 Acute OV : Cough  Patient presents for an acute office visit.  She complains of 1 week of increased cough and congestion with green mucus. No fever or hemoptysis . Taking Astelin nasal spray, allegra and chlor tabs. Has also tried benadryl. Has nasal drainage and post nasal drip .  No n/v/d. Prone to bronchitis and chronic rhinitis that has seasonal flares - spring and fall .  Allergic to mold and dust. Follows with Dr. Curley Spice with Allergy .   Patient is being followed for shortness of breath and pulmonary hypertension.  Previous echo and 2022 and repeat echo in December 2023 showed severe pulmonary artery hypertension.  Patient is currently following with cardiology.  Patient does have a history of A-fib on amiodarone. PFTs done on March 28, 2022 moderate restriction with airflow reversibility with FEV1 at 83%, ratio 93, FVC 66%, positive bronchodilator  response (22% change), diffusing capacity 60%. High-resolution CT chest done earlier this month March 5 showed no evidence of interstitial lung disease air trapping on expiratory phase suggestive of small airways disease and small tiny nodules in the lung apices and 4 mm subsolid nodule in the right upper lobe.  Scattered small pulmonary nodules.  She has been told in the past that she snored.  Does have some daytime sleepiness and restless sleep intermittently.  Has never had a sleep study before.  Patient has never been diagnosed with asthma in the past but has been told that she has recurrent bronchitis.     05/12/2022   10:00 AM  Results of the Epworth flowsheet  Sitting and reading 2  Watching TV 2  Sitting, inactive in a public place (e.g. a theatre or a meeting) 0  As a passenger in a car for an hour without a break 0  Lying down to rest in the afternoon when circumstances permit 1  Sitting and talking to someone 0  Sitting quietly after a lunch without alcohol 0  In a car, while stopped for a few minutes in traffic 0  Total score 5     Allergies  Allergen Reactions   Amlodipine Besy-Benazepril Hcl Swelling    Lotrel caused swelling of feet & facial swelling unilaterally   Amlodipine Besy-Benazepril Hcl Other (See Comments)   Lotrel [Amlodipine Besy-Benazepril Hcl]    Molds & Smuts Other (See Comments)   Wasp Venom Swelling    Allergic to wasps    Immunization History  Administered Date(s) Administered   Fluad Quad(high Dose 65+) 12/03/2020, 12/19/2021   Influenza Split 01/03/2011, 12/28/2011   Influenza Whole 12/14/2006, 01/07/2008, 12/16/2008,  12/15/2009   Influenza, High Dose Seasonal PF 12/25/2012, 03/15/2021   Influenza,inj,Quad PF,6+ Mos 11/28/2013, 12/04/2014, 12/08/2015, 11/27/2016, 11/30/2017, 12/05/2018, 11/29/2019   PFIZER Comirnaty(Gray Top)Covid-19 Tri-Sucrose Vaccine 03/19/2019, 04/12/2019   PFIZER(Purple Top)SARS-COV-2 Vaccination 03/19/2019, 04/12/2019,  06/24/2019, 12/22/2019   Pneumococcal Conjugate-13 05/28/2015   Pneumococcal Polysaccharide-23 05/01/2012, 03/15/2021   Td 04/20/2009    Past Medical History:  Diagnosis Date   Adenomatous colon polyp 2005   Allergy    perennial ; shots from Dr Velora Heckler   Aortic insufficiency    Hyperlipidemia    Hypertension    Murmur    a.  04/2011 Echo:  EF 55-60%, mild-mod ai/mr   Squamous cell carcinoma of skin 08/05/2020   left pretibia EDC   Uterine cancer ([place])     Tobacco History: Social History   Tobacco Use  Smoking Status Never   Passive exposure: Past  Smokeless Tobacco Never   Counseling given: Not Answered   Outpatient Medications Prior to Visit  Medication Sig Dispense Refill   amiodarone (PACERONE) 200 MG tablet Take 0.5 tablets (100 mg total) by mouth daily. 45 tablet 1   amLODipine (NORVASC) 5 MG tablet Take 1 tablet (5 mg total) by mouth daily. 90 tablet 1   apixaban (ELIQUIS) 2.5 MG TABS tablet Take 1 tablet (2.5 mg total) by mouth 2 (two) times daily. 60 tablet 3   azelastine (ASTELIN) 0.1 % nasal spray Place 1 spray into both nostrils 2 (two) times daily. Use in each nostril as directed 30 mL 5   EPINEPHrine 0.3 mg/0.3 mL IJ SOAJ injection Inject 0.3 mg into the muscle as needed for anaphylaxis.     famotidine (PEPCID) 20 MG tablet Take 20 mg by mouth daily.     fexofenadine (ALLEGRA) 180 MG tablet Take 180 mg by mouth daily.     furosemide (LASIX) 20 MG tablet Take 1 tablet (20 mg total) by mouth daily. 180 tablet 2   glucosamine-chondroitin 500-400 MG tablet Take 1 tablet by mouth 2 (two) times daily. 1 pm     Krill Oil 500 MG CAPS Take by mouth.     Multiple Vitamin (MULTIVITAMIN) capsule Take 1 capsule by mouth daily.     potassium chloride SA (KLOR-CON M) 20 MEQ tablet Take 1 tablet (20 mEq total) by mouth daily. Take 1 tablet (20 meq) twice every other day alternating with 1 tablet (20 meq) once every other day (Patient taking differently: Take 20 mEq by mouth  daily. Take once daily with Lasix) 135 tablet 1   pravastatin (PRAVACHOL) 40 MG tablet TAKE 1 TABLET BY MOUTH EVERYDAY AT BEDTIME 90 tablet 3   prednisoLONE acetate (PRED FORTE) 1 % ophthalmic suspension Apply to eye.     sertraline (ZOLOFT) 25 MG tablet TAKE 1 TABLET BY MOUTH EVERY DAY 100 tablet 3   sodium chloride (MURO 128) 5 % ophthalmic solution PLACE 1 DROP INTO RIGHT EYE 4 TIMES A DAY     triamcinolone (NASACORT ALLERGY 24HR) 55 MCG/ACT AERO nasal inhaler Place 2 sprays into the nose daily.     valsartan (DIOVAN) 160 MG tablet Take 1 tablet (160 mg total) by mouth 2 (two) times daily. 180 tablet 1   vitamin B-12 (CYANOCOBALAMIN) 500 MCG tablet Take 500 mcg by mouth daily.     empagliflozin (JARDIANCE) 10 MG TABS tablet Take 1 tablet (10 mg total) by mouth daily before breakfast. (Patient not taking: Reported on 05/12/2022) 30 tablet 6   No facility-administered medications prior to visit.  Review of Systems:   Constitutional:   No  weight loss, night sweats,  Fevers, chills, + fatigue, or  lassitude.  HEENT:   No headaches,  Difficulty swallowing,  Tooth/dental problems, or  Sore throat,                No sneezing, itching, ear ache,  +nasal congestion, post nasal drip,   CV:  No chest pain,  Orthopnea, PND, swelling in lower extremities, anasarca, dizziness, palpitations, syncope.   GI  No heartburn, indigestion, abdominal pain, nausea, vomiting, diarrhea, change in bowel habits, loss of appetite, bloody stools.   Resp: .  No chest wall deformity  Skin: no rash or lesions.  GU: no dysuria, change in color of urine, no urgency or frequency.  No flank pain, no hematuria   MS:  No joint pain or swelling.  No decreased range of motion.  No back pain.    Physical Exam  BP 124/70 (BP Location: Left Arm, Cuff Size: Normal)   Pulse 77   Temp 97.8 F (36.6 C) (Temporal)   Ht 5\' 3"  (1.6 m)   Wt 123 lb 9.6 oz (56.1 kg)   SpO2 95%   BMI 21.89 kg/m   GEN: A/Ox3;  pleasant , NAD, well nourished    HEENT:  [place]/AT,  EACs-clear, TMs-wnl, NOSE-clear, THROAT-clear, no lesions, no postnasal drip or exudate noted.   NECK:  Supple w/ fair ROM; no JVD; normal carotid impulses w/o bruits; no thyromegaly or nodules palpated; no lymphadenopathy.    RESP  Clear  P & A; w/o, wheezes/ rales/ or rhonchi. no accessory muscle use, no dullness to percussion  CARD:  RRR, no m/r/g, no peripheral edema, pulses intact, no cyanosis or clubbing.  GI:   Soft & nt; nml bowel sounds; no organomegaly or masses detected.   Musco: Warm bil, no deformities or joint swelling noted.   Neuro: alert, no focal deficits noted.    Skin: Warm, no lesions or rashes    Lab Results:  CBC  BMET  No results found for: "PROBNP"  Imaging: CT Chest High Resolution  Result Date: 05/03/2022 CLINICAL DATA:  Pulmonary hypertension, pulmonary fibrosis EXAM: CT CHEST WITHOUT CONTRAST TECHNIQUE: Multidetector CT imaging of the chest was performed following the standard protocol without intravenous contrast. High resolution imaging of the lungs, as well as inspiratory and expiratory imaging, was performed. RADIATION DOSE REDUCTION: This exam was performed according to the departmental dose-optimization program which includes automated exposure control, adjustment of the mA and/or kV according to patient size and/or use of iterative reconstruction technique. COMPARISON:  CT chest coronary angiogram, 06/28/2011 FINDINGS: Cardiovascular: Aortic atherosclerosis. Mild cardiomegaly. Three-vessel coronary artery calcifications. No pericardial effusion. Mediastinum/Nodes: No enlarged mediastinal, hilar, or axillary lymph nodes. Thyroid gland, trachea, and esophagus demonstrate no significant findings. Lungs/Pleura: No evidence of fibrotic interstitial lung disease. Occasional small bilateral pulmonary nodules. A subsolid nodule in the peripheral right upper lobe measuring 0.4 cm was not included in the field  of view on prior examination (series 4, image 46). Small nodule of the left lower lobe measuring 0.5 cm is unchanged compared to prior examination dated 2013 and definitively benign (series 4, image 95). Additional background of tiny centrilobular nodules, most concentrated in the lung apices. Lobular air trapping on expiratory phase imaging. No pleural effusion or pneumothorax. Upper Abdomen: No acute abnormality. Musculoskeletal: No chest wall abnormality. No acute osseous findings. IMPRESSION: 1. No evidence of fibrotic interstitial lung disease. 2. Lobular air trapping on expiratory phase  imaging, suggestive of small airways disease. 3. Occasional small bilateral pulmonary nodules. A subsolid nodule in the peripheral right upper lobe measuring 0.4 cm was not included in the field of view on prior examination and is indeterminate. No follow-up needed if patient is low-risk.This recommendation follows the consensus statement: Guidelines for Management of Incidental Pulmonary Nodules Detected on CT Images: From the Fleischner Society 2017; Radiology 2017; 284:228-243. 4. Additional background of tiny centrilobular nodules, most concentrated in the lung apices, most commonly seen in smoking-related respiratory bronchiolitis. 5. Cardiomegaly and coronary artery disease. Aortic Atherosclerosis (ICD10-I70.0). Electronically Signed   By: Delanna Ahmadi M.D.   On: 05/03/2022 14:37    albuterol (PROVENTIL) (2.5 MG/3ML) 0.083% nebulizer solution 2.5 mg     Date Action Dose Route User   03/28/2022 1120 Given 2.5 mg Nebulization Marlane Mingle, RRT          Latest Ref Rng & Units 03/28/2022   11:55 AM  PFT Results  FVC-Pre L 1.35   FVC-Predicted Pre % 56   FVC-Post L 1.60   FVC-Predicted Post % 66   Pre FEV1/FVC % % 90   Post FEV1/FCV % % 93   FEV1-Pre L 1.21   FEV1-Predicted Pre % 68   FEV1-Post L 1.48   DLCO uncorrected ml/min/mmHg 10.95   DLCO UNC% % 60   DLVA Predicted % 85   TLC L 3.10   TLC  % Predicted % 63   RV % Predicted % 71     No results found for: "NITRICOXIDE"      Assessment & Plan:   Acute bronchitis Acute bronchitis-we will treat with empiric antibiotics and short course of steroids.  Control for triggers.-Patient has chronic allergies.  Also would cover for reflux  Plan  Patient Instructions  Begin Breo 1 puff daily  Albuterol inhaler 1-2 puffs every 6hrs as needed.  Begin Doxycycline 100mg  Twice daily  for 1 week, take with food  Prednisone 20mg  daily for 5 days , take with food .  Delsym 2 tsp Twice daily  As needed  cough  Tessalon Three times a day  As needed  cough  Change Allegra to Zyrtec 10mg  daily  Pepcid 20mg  At bedtime   May use Chlorpheniramine 4mg  At bedtime  As needed  drainage (allegry relief)  Saline nasal rinse Twice daily   Saline nasal gel At bedtime   Continue on Astelin nasal Twice daily   Continue on Oxygen 2l/m .At bedtime   Set up for split night sleep study .  Follow up with Dr. Camillo Flaming as planned and As needed   Please contact office for sooner follow up if symptoms do not improve or worsen or seek emergency care      Severe pulmonary hypertension [facility]) Patient has underlying severe pulmonary hypertension-followed by cardiology.  Recent high-resolution CT chest showed no evidence of interstitial lung disease.  No evidence for amiodarone induced pulmonary toxicity.  Recent echo shows only moderate mitral valve regurg.  And mild tricuspid regurg.  PFTs do show moderate restriction with reversibility questionable asthma component.  Need to rule out underlying sleep apnea.  Will set patient up for a in lab split-night sleep study.  Continue on nocturnal oxygen.  Plan  Patient Instructions  Begin Breo 1 puff daily  Albuterol inhaler 1-2 puffs every 6hrs as needed.  Begin Doxycycline 100mg  Twice daily  for 1 week, take with food  Prednisone 20mg  daily for 5 days , take with food .  Delsym  2 tsp Twice daily  As needed  cough   Tessalon Three times a day  As needed  cough  Change Allegra to Zyrtec 10mg  daily  Pepcid 20mg  At bedtime   May use Chlorpheniramine 4mg  At bedtime  As needed  drainage (allegry relief)  Saline nasal rinse Twice daily   Saline nasal gel At bedtime   Continue on Astelin nasal Twice daily   Continue on Oxygen 2l/m .At bedtime   Set up for split night sleep study .  Follow up with Dr. Camillo Flaming as planned and As needed   Please contact office for sooner follow up if symptoms do not improve or worsen or seek emergency care      Congestive heart failure ([place]) Appears euvolemic on exam without evidence of overt volume overload.  Continue current regimen and follow-up with cardiology.  Chronic respiratory failure with hypoxia ([facility]) Continue on oxygen 2 L at bedtime  Asthma PFTs show moderate restriction with significant reversibility possibly underlying asthma versus reactive airways.  Trial of Breo.  Albuterol inhaler as needed.  Continue to control for postnasal drainage and GERD.  Plan  Patient Instructions  Begin Breo 1 puff daily  Albuterol inhaler 1-2 puffs every 6hrs as needed.  Begin Doxycycline 100mg  Twice daily  for 1 week, take with food  Prednisone 20mg  daily for 5 days , take with food .  Delsym 2 tsp Twice daily  As needed  cough  Tessalon Three times a day  As needed  cough  Change Allegra to Zyrtec 10mg  daily  Pepcid 20mg  At bedtime   May use Chlorpheniramine 4mg  At bedtime  As needed  drainage (allegry relief)  Saline nasal rinse Twice daily   Saline nasal gel At bedtime   Continue on Astelin nasal Twice daily   Continue on Oxygen 2l/m .At bedtime   Set up for split night sleep study .  Follow up with Dr. Camillo Flaming as planned and As needed   Please contact office for sooner follow up if symptoms do not improve or worsen or seek emergency care        I spent   41 minutes dedicated to the care of this patient on the date of this encounter to include pre-visit  review of records, face-to-face time with the patient discussing conditions above, post visit ordering of testing, clinical documentation with the electronic health record, making appropriate referrals as documented, and communicating necessary findings to members of the patients care team.   Rexene Edison, NP 05/12/2022

## 2022-05-12 NOTE — Assessment & Plan Note (Signed)
PFTs show moderate restriction with significant reversibility possibly underlying asthma versus reactive airways.  Trial of Breo.  Albuterol inhaler as needed.  Continue to control for postnasal drainage and GERD.  Plan  Patient Instructions  Begin Breo 1 puff daily  Albuterol inhaler 1-2 puffs every 6hrs as needed.  Begin Doxycycline 100mg  Twice daily  for 1 week, take with food  Prednisone 20mg  daily for 5 days , take with food .  Delsym 2 tsp Twice daily  As needed  cough  Tessalon Three times a day  As needed  cough  Change Allegra to Zyrtec 10mg  daily  Pepcid 20mg  At bedtime   May use Chlorpheniramine 4mg  At bedtime  As needed  drainage (allegry relief)  Saline nasal rinse Twice daily   Saline nasal gel At bedtime   Continue on Astelin nasal Twice daily   Continue on Oxygen 2l/m .At bedtime   Set up for split night sleep study .  Follow up with [name] as planned and As needed   Please contact office for sooner follow up if symptoms do not improve or worsen or seek emergency care

## 2022-05-12 NOTE — Assessment & Plan Note (Signed)
Acute bronchitis-we will treat with empiric antibiotics and short course of steroids.  Control for triggers.-Patient has chronic allergies.  Also would cover for reflux  Plan  Patient Instructions  Begin Breo 1 puff daily  Albuterol inhaler 1-2 puffs every 6hrs as needed.  Begin Doxycycline 100mg  Twice daily  for 1 week, take with food  Prednisone 20mg  daily for 5 days , take with food .  Delsym 2 tsp Twice daily  As needed  cough  Tessalon Three times a day  As needed  cough  Change Allegra to Zyrtec 10mg  daily  Pepcid 20mg  At bedtime   May use Chlorpheniramine 4mg  At bedtime  As needed  drainage (allegry relief)  Saline nasal rinse Twice daily   Saline nasal gel At bedtime   Continue on Astelin nasal Twice daily   Continue on Oxygen 2l/m .At bedtime   Set up for split night sleep study .  Follow up with Dr. Camillo Flaming as planned and As needed   Please contact office for sooner follow up if symptoms do not improve or worsen or seek emergency care

## 2022-05-12 NOTE — Assessment & Plan Note (Addendum)
Patient has underlying severe pulmonary hypertension-followed by cardiology.  Recent high-resolution CT chest showed no evidence of interstitial lung disease.  No evidence for amiodarone induced pulmonary toxicity.  Recent echo shows only moderate mitral valve regurg.  And mild tricuspid regurg.  PFTs do show moderate restriction with reversibility questionable asthma component.  Need to rule out underlying sleep apnea.  Will set patient up for a in lab split-night sleep study.  Continue on nocturnal oxygen.  Plan  Patient Instructions  Begin Breo 1 puff daily  Albuterol inhaler 1-2 puffs every 6hrs as needed.  Begin Doxycycline 100mg  Twice daily  for 1 week, take with food  Prednisone 20mg  daily for 5 days , take with food .  Delsym 2 tsp Twice daily  As needed  cough  Tessalon Three times a day  As needed  cough  Change Allegra to Zyrtec 10mg  daily  Pepcid 20mg  At bedtime   May use Chlorpheniramine 4mg  At bedtime  As needed  drainage (allegry relief)  Saline nasal rinse Twice daily   Saline nasal gel At bedtime   Continue on Astelin nasal Twice daily   Continue on Oxygen 2l/m .At bedtime   Set up for split night sleep study .  Follow up with Dr. Camillo Flaming as planned and As needed   Please contact office for sooner follow up if symptoms do not improve or worsen or seek emergency care

## 2022-05-12 NOTE — Patient Instructions (Addendum)
Begin Breo 1 puff daily  Albuterol inhaler 1-2 puffs every 6hrs as needed.  Begin Doxycycline 100mg  Twice daily  for 1 week, take with food  Prednisone 20mg  daily for 5 days , take with food .  Delsym 2 tsp Twice daily  As needed  cough  Tessalon Three times a day  As needed  cough  Change Allegra to Zyrtec 10mg  daily  Pepcid 20mg  At bedtime   May use Chlorpheniramine 4mg  At bedtime  As needed  drainage (allegry relief)  Saline nasal rinse Twice daily   Saline nasal gel At bedtime   Continue on Astelin nasal Twice daily   Continue on Oxygen 2l/m .At bedtime   Set up for split night sleep study .  Follow up with Dr. Camillo Flaming as planned and As needed   Please contact office for sooner follow up if symptoms do not improve or worsen or seek emergency care

## 2022-05-12 NOTE — Assessment & Plan Note (Signed)
Continue on oxygen 2 L at bedtime 

## 2022-05-12 NOTE — Progress Notes (Signed)
Agree with the details of the visit as noted by Tammy Parrett, NP.  C. Laura Quinnlan Abruzzo, MD [facility] 

## 2022-05-12 NOTE — Assessment & Plan Note (Signed)
Appears euvolemic on exam without evidence of overt volume overload.  Continue current regimen and follow-up with cardiology.

## 2022-05-15 ENCOUNTER — Telehealth: Payer: Self-pay

## 2022-05-15 NOTE — Telephone Encounter (Signed)
Spoke with patient and informed her that she would not need eliquis dose adjusted related to her PCP stopping jardiance, per Darylene Price, FNP. Pt verbalized understanding.  Pt states rash is mostly gone, but she's unsure if it's related to stopping jardiance or starting prednisone for a separate issue. She will let us know if rash returns. Encouraged pt to call if she notices swelling, or weight gain greater than 2 lbs overnight or 5 lbs/week.  Pt agreed and had no further questions at this time.

## 2022-05-15 NOTE — Telephone Encounter (Signed)
Patients daughter called back in stating that she has been taking care of as far as the rash and has been taking off Jardiance per her PCP. Patient's daughter wants to know if she needs to get her Eliquis adjusted now that she is no linger on the medication    Please call and advise

## 2022-05-15 NOTE — Telephone Encounter (Signed)
Entered in error

## 2022-05-23 DIAGNOSIS — J301 Allergic rhinitis due to pollen: Secondary | ICD-10-CM | POA: Diagnosis not present

## 2022-05-23 DIAGNOSIS — J3089 Other allergic rhinitis: Secondary | ICD-10-CM | POA: Diagnosis not present

## 2022-05-23 DIAGNOSIS — J3081 Allergic rhinitis due to animal (cat) (dog) hair and dander: Secondary | ICD-10-CM | POA: Diagnosis not present

## 2022-05-28 ENCOUNTER — Other Ambulatory Visit: Payer: Self-pay | Admitting: Cardiovascular Disease

## 2022-05-29 ENCOUNTER — Telehealth: Payer: Self-pay | Admitting: Pulmonary Disease

## 2022-05-29 NOTE — Telephone Encounter (Signed)
I have spoke with [facility] and they need the sleep study order signed by Dr. Patsey Berthold and faxed to their office.   I have printed the order and I am waiting for signature.

## 2022-05-29 NOTE — Telephone Encounter (Signed)
[facility] calling Dr.Gonz. needs to sign the pt. HST and fax it to there office again [phone]

## 2022-05-31 ENCOUNTER — Encounter: Payer: Medicare Other | Admitting: Internal Medicine

## 2022-05-31 ENCOUNTER — Ambulatory Visit: Payer: Medicare Other | Attending: Internal Medicine | Admitting: Internal Medicine

## 2022-05-31 VITALS — BP 170/76 | HR 76 | Wt 126.2 lb

## 2022-05-31 DIAGNOSIS — I272 Pulmonary hypertension, unspecified: Secondary | ICD-10-CM

## 2022-05-31 DIAGNOSIS — I480 Paroxysmal atrial fibrillation: Secondary | ICD-10-CM | POA: Diagnosis not present

## 2022-05-31 DIAGNOSIS — I5032 Chronic diastolic (congestive) heart failure: Secondary | ICD-10-CM | POA: Diagnosis not present

## 2022-05-31 MED ORDER — DAPAGLIFLOZIN PROPANEDIOL 10 MG PO TABS: 10.0000 mg | ORAL_TABLET | Freq: Every day | ORAL | 3 refills | Status: DC

## 2022-05-31 NOTE — Telephone Encounter (Signed)
I have faxed back the order that states Rexene Edison, NP signed the order on 05/12/2022.  Nothing further needed.

## 2022-05-31 NOTE — Patient Instructions (Addendum)
START Farxiga 10mg  daily  Your provider requests you have an echocardiogram  Follow up in 2 months  Do the following things EVERYDAY: Weigh yourself in the morning before breakfast. Write it down and keep it in a log. Take your medicines as prescribed Eat low salt foods--Limit salt (sodium) to 2000 mg per day.  Stay as active as you can everyday Limit all fluids for the day to less than 2 liters

## 2022-05-31 NOTE — Progress Notes (Signed)
ADVANCED HF CLINIC NOTE  Referring Physician: Dr. Mariah Rogers Primary Care: Melissa Schwalbe, MD Primary Cardiologist: Dr. Lorenz Rogers   HPI:  [name] is a 82 year old woman with chronic AF, HTN, diastolic HF, moderate MR. PAD referred by Dr. Mariah Rogers for further evaluation of her pulmonary HTN   Previously followed at [facility]. Previous echos with normal systolic function and severe MR. Transferred care to Dr. Mariah Rogers in 2023.   Developed SOB in 11/23. Saw Dr. Alphonsus Rogers. Felt to have HF. CXR showed pulmonary edema.   Saw Dr. Marcos Rogers and walk test on 01/31/22 with sats in the 80s. Did ONOX with sats down to 75% Started on nighttime O2 and lasix and Kcl. BNP 501   Saw Dr. Mariah Rogers on 02/06/22 and 03/01/21. Lasix increased to 20 bid. Lopressor decreased.  Echo 12/23 EF 60-65% LA moderately dilated. Moderate MR. G3 DD. RV normal. RVSP 63mm HG  Non smoker. Husband was a smoker. No h/o DVT/PE. No h/o connective tissue disorder.  I ordered hi-res CT at last visit (2/24)  Hi res CT 3/24: No ILD. Air trapping c/w small airway disease. + lung nodules. + CAC  Here with her daughter. Keeping fluid off. Says she can do anything she wants to. Can go to store and do housework. No edema, SOB, orthopnea or PND.   Stopped Jardiance due to bad rash on face and neck.   PFTs 03/28/22 FEV1 1.21 (68%) FEVC 1.35 (56%) DLCO 60%  Past Medical History:  Diagnosis Date   Adenomatous colon polyp 2005   Allergy    perennial ; shots from Dr [name]   Aortic insufficiency    Hyperlipidemia    Hypertension    Murmur    a.  04/2011 Echo:  EF 55-60%, mild-mod ai/mr   Squamous cell carcinoma of skin 08/05/2020   left pretibia EDC   Uterine cancer (HCC)     Current Outpatient Medications  Medication Sig Dispense Refill   albuterol (VENTOLIN HFA) 108 (90 Base) MCG/ACT inhaler Inhale 1-2 puffs into the lungs every 6 (six) hours as needed. 8 g 2   amiodarone (PACERONE) 200 MG tablet Take 0.5 tablets (100 mg  total) by mouth daily. 45 tablet 1   amLODipine (NORVASC) 5 MG tablet Take 1 tablet (5 mg total) by mouth daily. 90 tablet 1   apixaban (ELIQUIS) 2.5 MG TABS tablet Take 1 tablet (2.5 mg total) by mouth 2 (two) times daily. 60 tablet 3   azelastine (ASTELIN) 0.1 % nasal spray Place 1 spray into both nostrils 2 (two) times daily. Use in each nostril as directed 30 mL 5   benzonatate (TESSALON) 200 MG capsule Take 1 capsule (200 mg total) by mouth 3 (three) times daily as needed. 45 capsule 1   doxycycline (VIBRA-TABS) 100 MG tablet Take 1 tablet (100 mg total) by mouth 2 (two) times daily. 14 tablet 0   EPINEPHrine 0.3 mg/0.3 mL IJ SOAJ injection Inject 0.3 mg into the muscle as needed for anaphylaxis.     famotidine (PEPCID) 20 MG tablet Take 20 mg by mouth daily.     fexofenadine (ALLEGRA) 180 MG tablet Take 180 mg by mouth daily.     fluticasone furoate-vilanterol (BREO ELLIPTA) 100-25 MCG/ACT AEPB Inhale 1 puff into the lungs daily. 30 each 5   furosemide (LASIX) 20 MG tablet Take 1 tablet (20 mg total) by mouth daily. 180 tablet 2   glucosamine-chondroitin 500-400 MG tablet Take 1 tablet by mouth 2 (two) times daily. 1 pm  Krill Oil 500 MG CAPS Take by mouth.     Multiple Vitamin (MULTIVITAMIN) capsule Take 1 capsule by mouth daily.     potassium chloride SA (KLOR-CON M) 20 MEQ tablet Take by mouth. Take 1 tablet when you take lasix     pravastatin (PRAVACHOL) 40 MG tablet TAKE 1 TABLET BY MOUTH EVERYDAY AT BEDTIME 90 tablet 3   prednisoLONE acetate (PRED FORTE) 1 % ophthalmic suspension Apply to eye.     predniSONE (DELTASONE) 20 MG tablet Take 1 tablet (20 mg total) by mouth daily with breakfast. 5 tablet 0   sertraline (ZOLOFT) 25 MG tablet TAKE 1 TABLET BY MOUTH EVERY DAY 100 tablet 3   sodium chloride (MURO 128) 5 % ophthalmic solution PLACE 1 DROP INTO RIGHT EYE 4 TIMES A DAY     triamcinolone (NASACORT ALLERGY 24HR) 55 MCG/ACT AERO nasal inhaler Place 2 sprays into the nose daily.      valsartan (DIOVAN) 160 MG tablet Take 1 tablet (160 mg total) by mouth 2 (two) times daily. 180 tablet 1   vitamin B-12 (CYANOCOBALAMIN) 500 MCG tablet Take 500 mcg by mouth daily.     No current facility-administered medications for this visit.    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  Tobacco Use   Smoking status: Never    Passive exposure: Past   Smokeless tobacco: Never  Vaping Use   Vaping Use: Never used  Substance and Sexual Activity   Alcohol use: Yes    Comment: special events   Drug use: No   Sexual activity: Never  Other Topics Concern   Not on file  Social History Narrative   Widowed 12/19      Has living will   Children now would be health care POA   Would accept resuscitation but no prolonged ventilation   No tube feeds if cognitively unaware      2 daughters, 4 grandsons 1 great-grandson   Retired Medical sales representative from [place] Strain: Not on [employer] Insecurity: Not on file  Transportation Needs: Not on file  Physical Activity: Not on file  Stress: Not on file  Social Connections: Not on file  Intimate Partner Violence: Not on file      Family History  Problem Relation Age of Onset   Stroke Mother 67   Heart failure Mother    Heart attack Father 70   Lung cancer Father    Cancer Maternal Aunt         X 2; Gyn & ? primary   Diabetes  Maternal Grandmother    Esophageal cancer Neg Hx    Colon cancer Neg Hx    Stomach cancer Neg Hx    Rectal cancer Neg Hx     Vitals:   05/31/22 1446  BP: Marland Kitchen)  170/76  Pulse: 76  SpO2: 98%  Weight: 126 lb 4 oz (57.3 kg)   Wt Readings from Last 3 Encounters:  05/31/22 126 lb 4 oz (57.3 kg)  05/12/22 123 lb 9.6 oz (56.1 kg)  05/11/22 125 lb (56.7 kg)    PHYSICAL EXAM: General:  Well appearing. No resp difficulty HEENT: normal Neck: supple. no JVD. Carotids 2+ bilat; no bruits. No lymphadenopathy or thryomegaly appreciated. Cor: PMI nondisplaced. Regular rate & rhythm. No rubs, gallops or murmurs. Lungs: clear Abdomen: soft, nontender, nondistended. No hepatosplenomegaly. No bruits or masses. Good bowel sounds. Extremities: no cyanosis, clubbing, rash, edema Neuro: alert & orientedx3, cranial nerves grossly intact. moves all 4 extremities w/o difficulty. Affect pleasant  ASSESSMENT & PLAN:   1. 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 RV is normal - mainstay of therapy is volume control and BP management - However, recent PFTs with DLCO show restrictive physiology with a diffusion defect suggestive of interstitial process  - Echo 12/23 EF 60-65% LA moderately dilated. Moderate MR. G3 DD. RV normal. RVSP 44mm HG - Hi res CT 3/24: No ILD. Air trapping c/w small airway disease. + lung nodules. + CAC - Functional capacity improved with diuresis. NYHA II - Continue lasix 20 daily. - Jardiance stopped due to rash/allergy. Will switch to [place] - Consider spiro in future - Chest CT 3/24 no ILD  - Will repeat echo to reassess pulmonary pressures. We again discussed the possibility of invasive w/u with R +/- L heart cath but doubt this will change our management much and she and her daughters agree on plan to defer for now  2. Diastolic HF - plan as above  3.. Paroxysmal AF - In NSR today. Continue amio 100 and Eliquis -  Managed by Dr. Mariah Rogers - Given normal EF and underlying lung disease, I wonder if it would be possible to switch off amio   4. Hypoxemia - followed by Dr. Marcos Rogers - likely multifactorial - plan as above  5. HTN - Blood pressure high today here. Check BP log  Arvilla Meres, MD  3:05 PM

## 2022-05-31 NOTE — Telephone Encounter (Signed)
Did we ever get the signature to close the encounter

## 2022-06-01 ENCOUNTER — Encounter: Payer: Self-pay | Admitting: Pulmonary Disease

## 2022-06-01 ENCOUNTER — Ambulatory Visit (INDEPENDENT_AMBULATORY_CARE_PROVIDER_SITE_OTHER): Payer: Medicare Other | Admitting: Pulmonary Disease

## 2022-06-01 VITALS — BP 132/72 | HR 66 | Temp 97.1°F | Ht 63.0 in | Wt 126.4 lb

## 2022-06-01 DIAGNOSIS — G4736 Sleep related hypoventilation in conditions classified elsewhere: Secondary | ICD-10-CM

## 2022-06-01 DIAGNOSIS — I2729 Other secondary pulmonary hypertension: Secondary | ICD-10-CM

## 2022-06-01 DIAGNOSIS — J4530 Mild persistent asthma, uncomplicated: Secondary | ICD-10-CM

## 2022-06-01 DIAGNOSIS — I340 Nonrheumatic mitral (valve) insufficiency: Secondary | ICD-10-CM | POA: Diagnosis not present

## 2022-06-01 DIAGNOSIS — I480 Paroxysmal atrial fibrillation: Secondary | ICD-10-CM | POA: Diagnosis not present

## 2022-06-01 DIAGNOSIS — R0683 Snoring: Secondary | ICD-10-CM | POA: Diagnosis not present

## 2022-06-01 DIAGNOSIS — I272 Pulmonary hypertension, unspecified: Secondary | ICD-10-CM | POA: Diagnosis not present

## 2022-06-01 DIAGNOSIS — I5032 Chronic diastolic (congestive) heart failure: Secondary | ICD-10-CM | POA: Diagnosis not present

## 2022-06-01 NOTE — Patient Instructions (Signed)
Continue the [facility] for now.  Continue Zyrtec at nighttime.  Your sleep study should be scheduled soon.  The lab will call you with the date and time.  We will see you in follow-up in 3 months time call sooner should any new problems arise.

## 2022-06-01 NOTE — Progress Notes (Signed)
Subjective:    Patient ID: [name], female    DOB: March 07, 1940, 82 y.o.   MRN: [id number] Patient Care Team: Karie SchwalbeLetvak, Richard I, MD as PCP - General (Internal Medicine) Salena SanerGonzalez, Handy Mcloud L, MD as Consulting Physician (Pulmonary Disease) Bensimhon, Bevelyn Bucklesaniel R, MD as Consulting Physician (Cardiology)  Chief Complaint  Patient presents with   Follow-up    Better! No SOB or wheezing. Seldom has a cough. Drainage in the morning for the last few weeks.    HPI Melissa DamJeanette Corcino is an 82 year old lifelong never smoker, albeit with secondhand exposure in the past, and a history as noted below, who presents for follow-up of dyspnea.  Recall she was initially seen here on 31 January 2022 and at that time was seen in more of an acute valuation due to severe dyspnea.  For the details of that visit please refer to that note.  We instituted diuretic therapy with potassium supplementation.  Laboratory data at the time showed a BNP of 501.  Clinical evaluation was consistent with severe pulmonary hypertension which [facility] and mitral valve regurgitation.  Patient had developed atrial fibrillation and is likely worsened her volume status issues.  Since that time she has been followed by cardiology with Dr. Mariah MillingGollan and recently is also been followed by Dr. Nicholes Mangoan Bensimhon for further evaluation of her pulmonary hypertension.  She has had studies as noted below.  No evidence of ILD.  Her PFTs are a combination of restriction/obstruction she has a significant asthmatic component.  She will need repeat PFTs once she is better compensated from the asthma/CHF issues.  Patient was most recently seen on 12 May 2022 by Rubye Oaksammy Parrett, NP and Breo 100/25 was instituted.  This was given her asthmatic component on PFTs.  Since she started that she feels that she has had no cough or sputum production.  No wheezing.  Her dyspnea is better with Lasix and Breo.  The patient does not note any  palpitations and no paroxysmal nocturnal dyspnea.  Previously noted orthopnea but this has resolved.  She has not noted any fevers, chills or sweats.  No cough or sputum production, no hemoptysis.    She does have nocturnal hypoxemia likely on the basis of her pulmonary hypertension and is on nocturnal oxygen and is compliant.  She does not endorse any other symptomatology today.  Overall she feels well and looks well.   DATA 12/14/2020 echocardiogram [facility] LVEF over 55% VH, severe enlargement of the left and right atria.  Moderate to severe MR/TR, severe pulmonary hypertension. 01/26/2022 chest x-ray PA and lateral: Cardiomegaly, interstitial edema, small bilateral pleural effusions, perihilar bandlike atelectasis. 02/09/2022 echocardiogram: LVEF 60 to 65%, grade II DD, normal RV systolic function RV size normal.  Severely elevated pulmonary artery systolic pressure, RV systolic pressure estimated at 62.8 mm Hg, left atrial size moderately dilated, moderate MR, mild to moderate AR, no AS. 03/28/2022 PFTs: FEV1 1.21 L or 68% predicted FVC 1.35 L or 56% predicted, FEV1/FVC 90%.  There is significant bronchodilator response.  Mild to moderate restriction, moderate diffusion defect.  Combination of obstructive and restrictive physiology, obstructive physiology with bronchodilator response consistent with asthmatic response. 05/02/2022 CT chest high-resolution: No evidence of fibrotic interstitial lung disease, air trapping noted suggestive of small airways disease, few small bilateral pulmonary nodules less than or equal to 4 mm in diameter.  Review of Systems A 10 point review of systems was performed and it is as noted above otherwise negative.  Patient Active Problem List   Diagnosis Date Noted   Acute bronchitis 05/12/2022   Chronic respiratory failure with hypoxia 05/12/2022   Asthma 05/12/2022   Allergic reaction 05/11/2022   Acute respiratory failure with hypoxia 01/31/2022   Congestive  heart failure 01/31/2022   Severe pulmonary hypertension 01/31/2022   SOB (shortness of breath) 01/26/2022   Carotid artery disease 12/03/2019   Moderate mitral insufficiency 09/24/2018   Atrial fibrillation 08/14/2018   Mood disorder 06/21/2017   Preventative health care 05/31/2016   Advance directive discussed with patient 05/31/2016   Aortic insufficiency 06/08/2011   GERD 04/20/2009   RAYNAUD'S SYNDROME, HX OF 04/20/2009   History of colonic polyps 01/07/2008   HYPERLIPIDEMIA 12/14/2006   Essential hypertension 12/14/2006   Allergic rhinitis due to pollen 12/14/2006   SYMPTOM, INCONTINENCE, MIXED, URGE/STRESS 12/14/2006   Social History   Tobacco Use   Smoking status: Never    Passive exposure: Past   Smokeless tobacco: Never  Substance Use Topics   Alcohol use: Yes    Comment: special events   Allergies  Allergen Reactions   Amlodipine Besy-Benazepril Hcl Swelling    Lotrel caused swelling of feet & facial swelling unilaterally   Amlodipine Besy-Benazepril Hcl Other (See Comments)   Lotrel [Amlodipine Besy-Benazepril Hcl]    Molds & Smuts Other (See Comments)   Wasp Venom Swelling    Allergic to wasps   Current Meds  Medication Sig   albuterol (VENTOLIN HFA) 108 (90 Base) MCG/ACT inhaler Inhale 1-2 puffs into the lungs every 6 (six) hours as needed.   amiodarone (PACERONE) 200 MG tablet Take 0.5 tablets (100 mg total) by mouth daily.   amLODipine (NORVASC) 5 MG tablet Take 1 tablet (5 mg total) by mouth daily.   apixaban (ELIQUIS) 2.5 MG TABS tablet Take 1 tablet (2.5 mg total) by mouth 2 (two) times daily.   azelastine (ASTELIN) 0.1 % nasal spray Place 1 spray into both nostrils 2 (two) times daily. Use in each nostril as directed   benzonatate (TESSALON) 200 MG capsule Take 1 capsule (200 mg total) by mouth 3 (three) times daily as needed.   cetirizine (ZYRTEC) 10 MG tablet Take 10 mg by mouth daily.   chlorpheniramine (CHLOR-TRIMETON) 4 MG tablet Take 4 mg by  mouth daily.   dapagliflozin propanediol (FARXIGA) 10 MG TABS tablet Take 1 tablet (10 mg total) by mouth daily before breakfast.   EPINEPHrine 0.3 mg/0.3 mL IJ SOAJ injection Inject 0.3 mg into the muscle as needed for anaphylaxis.   famotidine (PEPCID) 20 MG tablet Take 20 mg by mouth daily.   fluticasone furoate-vilanterol (BREO ELLIPTA) 100-25 MCG/ACT AEPB Inhale 1 puff into the lungs daily.   furosemide (LASIX) 20 MG tablet Take 1 tablet (20 mg total) by mouth daily.   glucosamine-chondroitin 500-400 MG tablet Take 1 tablet by mouth 2 (two) times daily. 1 pm   Krill Oil 500 MG CAPS Take by mouth.   Multiple Vitamin (MULTIVITAMIN) capsule Take 1 capsule by mouth daily.   potassium chloride SA (KLOR-CON M) 20 MEQ tablet Take by mouth. Take 1 tablet when you take lasix   pravastatin (PRAVACHOL) 40 MG tablet TAKE 1 TABLET BY MOUTH EVERYDAY AT BEDTIME (Patient taking differently: Once a day with Lasix)   prednisoLONE acetate (PRED FORTE) 1 % ophthalmic suspension Apply to eye.   predniSONE (DELTASONE) 20 MG tablet Take 1 tablet (20 mg total) by mouth daily with breakfast.   sertraline (ZOLOFT) 25 MG tablet TAKE 1 TABLET BY  MOUTH EVERY DAY   sodium chloride (MURO 128) 5 % ophthalmic solution PLACE 1 DROP INTO RIGHT EYE 4 TIMES A DAY   valsartan (DIOVAN) 160 MG tablet Take 1 tablet (160 mg total) by mouth 2 (two) times daily.   vitamin B-12 (CYANOCOBALAMIN) 500 MCG tablet Take 500 mcg by mouth daily.   Immunization History  Administered Date(s) Administered   Fluad Quad(high Dose 65+) 12/03/2020, 12/19/2021   Influenza Split 01/03/2011, 12/28/2011   Influenza Whole 12/14/2006, 01/07/2008, 12/16/2008, 12/15/2009   Influenza, High Dose Seasonal PF 12/25/2012, 03/15/2021   Influenza,inj,Quad PF,6+ Mos 11/28/2013, 12/04/2014, 12/08/2015, 11/27/2016, 11/30/2017, 12/05/2018, 11/29/2019   PFIZER Comirnaty(Gray Top)Covid-19 Tri-Sucrose Vaccine 03/19/2019, 04/12/2019   PFIZER(Purple Top)SARS-COV-2  Vaccination 03/19/2019, 04/12/2019, 06/24/2019, 12/22/2019   Pneumococcal Conjugate-13 05/28/2015   Pneumococcal Polysaccharide-23 05/01/2012, 03/15/2021   Td 04/20/2009       Objective:   Physical Exam BP 132/72 (BP Location: Left Arm, Cuff Size: Normal)   Pulse 66   Temp (!) 97.1 F (36.2 C)   Ht 5\' 3"  (1.6 m)   Wt 126 lb 6.4 oz (57.3 kg)   SpO2 98%   BMI 22.39 kg/m   SpO2: 98 % O2 Device: None (Room air)  GENERAL: Patient is a well-developed, well-nourished elderly woman in no acute respiratory distress no tachypnea. She is fully ambulatory, no conversational dyspnea. HEAD: Normocephalic, atraumatic.  EYES: Pupils equal, round, reactive to light.  No scleral icterus.  MOUTH: Caps and crowns present.  Oral mucosa moist.  No thrush. NECK: Supple. No thyromegaly. Trachea midline. no JVD.  No adenopathy. PULMONARY: Good air entry bilaterally.  No adventitious sounds. CARDIOVASCULAR: S1 and S2.  Currently in NSR with CVR.  AR murmur 2/6, mitral regurgitation murmur 2/6 (stable).  ABDOMEN: Mildly protuberant otherwise benign. MUSCULOSKELETAL: No joint deformity, no clubbing, no lower extremity edema.  NEUROLOGIC: Grossly nonfocal. SKIN: Intact,warm,dry. PSYCH: Mood and behavior normal.     Assessment & Plan:      ICD-10-CM   1. Mild persistent asthma without complication  J45.30    Continue Breo 100 Continue as needed albuterol Patient well compensated in this regard    2. Severe pulmonary hypertension  I27.20    Likely etiology is diastolic dysfunction/mitral regurg Follows with advanced heart failure clinic    3. Chronic diastolic congestive heart failure  I50.32    This issue adds complexity to her management Follows with cardiology Suspect main etiology of pulmonary hypertension    4. PAF (paroxysmal atrial fibrillation)  I48.0    This issue adds complexity to her management Follows with cardiology    5. Nocturnal hypoxemia due to pulmonary hypertension   I27.29    G47.36    Continue oxygen at 2 L/min nocturnally    6. Moderate mitral insufficiency  I34.0    This issue adds complexity to her management Follows with cardiology    7. Loud snoring  R06.83    Sleep study being scheduled     Patient appears to be well compensated at present.  We will see her in follow-up in 3 months time she is to call sooner should any new problems arise.  Gailen Shelter, MD Advanced Bronchoscopy P[facility]M [facility]    *This note was dictated using voice recognition software/Dragon.  Despite best efforts to proofread, errors can occur which can change the meaning. Any transcriptional errors that result from this process are unintentional and may not be fully corrected at the time of dictation.

## 2022-06-02 ENCOUNTER — Encounter: Payer: Self-pay | Admitting: Pulmonary Disease

## 2022-06-04 ENCOUNTER — Other Ambulatory Visit: Payer: Self-pay | Admitting: Cardiovascular Disease

## 2022-06-07 ENCOUNTER — Encounter: Payer: Medicare Other | Attending: Internal Medicine | Admitting: *Deleted

## 2022-06-07 DIAGNOSIS — I480 Paroxysmal atrial fibrillation: Secondary | ICD-10-CM | POA: Insufficient documentation

## 2022-06-07 DIAGNOSIS — J4530 Mild persistent asthma, uncomplicated: Secondary | ICD-10-CM | POA: Insufficient documentation

## 2022-06-07 DIAGNOSIS — I272 Pulmonary hypertension, unspecified: Secondary | ICD-10-CM | POA: Insufficient documentation

## 2022-06-07 NOTE — Progress Notes (Signed)
Initial phone call completed. Diagnosis can be found in [facility] 4/3. EP Orientation scheduled for Wednesday 4/17 at 10:30.

## 2022-06-13 ENCOUNTER — Telehealth: Payer: Self-pay | Admitting: Pulmonary Disease

## 2022-06-13 DIAGNOSIS — J3081 Allergic rhinitis due to animal (cat) (dog) hair and dander: Secondary | ICD-10-CM | POA: Diagnosis not present

## 2022-06-13 DIAGNOSIS — J3089 Other allergic rhinitis: Secondary | ICD-10-CM | POA: Diagnosis not present

## 2022-06-13 DIAGNOSIS — J301 Allergic rhinitis due to pollen: Secondary | ICD-10-CM | POA: Diagnosis not present

## 2022-06-13 NOTE — Telephone Encounter (Signed)
[employer] calling regarding O2 re-certification for this PT. Need visit notes in order to fill O2 RX.   Fax is [phone] Attn: [facility] [employer]

## 2022-06-14 ENCOUNTER — Encounter: Payer: Medicare Other | Admitting: *Deleted

## 2022-06-14 VITALS — Ht 63.0 in | Wt 126.7 lb

## 2022-06-14 DIAGNOSIS — J4530 Mild persistent asthma, uncomplicated: Secondary | ICD-10-CM | POA: Diagnosis not present

## 2022-06-14 DIAGNOSIS — I480 Paroxysmal atrial fibrillation: Secondary | ICD-10-CM | POA: Diagnosis not present

## 2022-06-14 DIAGNOSIS — I272 Pulmonary hypertension, unspecified: Secondary | ICD-10-CM

## 2022-06-14 NOTE — Patient Instructions (Signed)
Patient Instructions  Patient Details  Name: [name] MRN: [id number] Date of Birth: 11-19-1940 Referring Provider:  Dolores Patty, MD  Below are your personal goals for exercise, nutrition, and risk factors. Our goal is to help you stay on track towards obtaining and maintaining these goals. We will be discussing your progress on these goals with you throughout the program.  Initial Exercise Prescription:  Initial Exercise Prescription - 06/14/22 1300       Date of Initial Exercise RX and Referring Provider   Date 06/14/22    Referring Provider Bensimhon      Oxygen   Maintain Oxygen Saturation 88% or higher      Recumbant Bike   Level 2    RPM 50    Watts 15    Minutes 15      NuStep   Level 2    SPM 80    Minutes 15    METs 1.97      REL-XR   Level 2    Speed 50    Minutes 15    METs 1.97      Track   Laps 30    Minutes 15    METs 2.63      Prescription Details   Frequency (times per week) 2    Duration Progress to 30 minutes of continuous aerobic without signs/symptoms of physical distress      Intensity   THRR 40-80% of Max Heartrate 97-124    Ratings of Perceived Exertion 11-13    Perceived Dyspnea 0-4      Progression   Progression Continue to progress workloads to maintain intensity without signs/symptoms of physical distress.      Resistance Training   Training Prescription Yes    Weight 2    Reps 10-15             Exercise Goals: Frequency: Be able to perform aerobic exercise two to three times per week in program working toward 2-5 days per week of home exercise.  Intensity: Work with a perceived exertion of 11 (fairly light) - 15 (hard) while following your exercise prescription.  We will make changes to your prescription with you as you progress through the program.   Duration: Be able to do 30 to 45 minutes of continuous aerobic exercise in addition to a 5 minute warm-up and a 5 minute cool-down routine.   Nutrition  Goals: Your personal nutrition goals will be established when you do your nutrition analysis with the dietician.  The following are general nutrition guidelines to follow: Cholesterol < 200mg /day Sodium < 1500mg /day Fiber: Women over 50 yrs - 21 grams per day  Personal Goals:  Personal Goals and Risk Factors at Admission - 06/14/22 1442       Core Components/Risk Factors/Patient Goals on Admission    Weight Management Yes    Intervention Weight Management: Develop a combined nutrition and exercise program designed to reach desired caloric intake, while maintaining appropriate intake of nutrient and fiber, sodium and fats, and appropriate energy expenditure required for the weight goal.;Weight Management: Provide education and appropriate resources to help participant work on and attain dietary goals.    Admit Weight 126 lb 11.2 oz (57.5 kg)    Goal Weight: Short Term 127 lb (57.6 kg)    Goal Weight: Long Term 127 lb (57.6 kg)    Expected Outcomes Short Term: Continue to assess and modify interventions until short term weight is achieved;Long Term: Adherence to nutrition and physical  activity/exercise program aimed toward attainment of established weight goal;Weight Maintenance: Understanding of the daily nutrition guidelines, which includes 25-35% calories from fat, 7% or less cal from saturated fats, less than  cholesterol, less than 1.5gm of sodium, & 5 or more servings of fruits and vegetables daily;Understanding recommendations for meals to include 15-35% energy as protein, 25-35% energy from fat, 35-60% energy from carbohydrates, less than  of dietary cholesterol, 20-35 gm of total fiber daily;Understanding of distribution of calorie intake throughout the day with the consumption of 4-5 meals/snacks    Heart Failure Yes    Intervention Provide a combined exercise and nutrition program that is supplemented with education, support and counseling about heart failure. Directed toward  relieving symptoms such as shortness of breath, decreased exercise tolerance, and extremity edema.    Expected Outcomes Improve functional capacity of life;Short term: Attendance in program 2-3 days a week with increased exercise capacity. Reported lower sodium intake. Reported increased fruit and vegetable intake. Reports medication compliance.;Short term: Daily weights obtained and reported for increase. Utilizing diuretic protocols set by physician.;Long term: Adoption of self-care skills and reduction of barriers for early signs and symptoms recognition and intervention leading to self-care maintenance.    Hypertension Yes    Intervention Provide education on lifestyle modifcations including regular physical activity/exercise, weight management, moderate sodium restriction and increased consumption of fresh fruit, vegetables, and low fat dairy, alcohol moderation, and smoking cessation.;Monitor prescription use compliance.    Expected Outcomes Short Term: Continued assessment and intervention until BP is < 140/89mm HG in hypertensive participants. < 130/81mm HG in hypertensive participants with diabetes, heart failure or chronic kidney disease.;Long Term: Maintenance of blood pressure at goal levels.    Lipids Yes    Intervention Provide education and support for participant on nutrition & aerobic/resistive exercise along with prescribed medications to achieve LDL 70mg , HDL >40mg .    Expected Outcomes Short Term: Participant states understanding of desired cholesterol values and is compliant with medications prescribed. Participant is following exercise prescription and nutrition guidelines.;Long Term: Cholesterol controlled with medications as prescribed, with individualized exercise RX and with personalized nutrition plan. Value goals: LDL < , HDL > 40 mg.             Tobacco Use Initial Evaluation: Social History   Tobacco Use  Smoking Status Never   Passive exposure: Past  Smokeless  Tobacco Never    Exercise Goals and Review:  Exercise Goals     Row Name 06/14/22 1346             Exercise Goals   Increase Physical Activity Yes       Intervention Provide advice, education, support and counseling about physical activity/exercise needs.;Develop an individualized exercise prescription for aerobic and resistive training based on initial evaluation findings, risk stratification, comorbidities and participant's personal goals.       Expected Outcomes Short Term: Attend rehab on a regular basis to increase amount of physical activity.;Long Term: Add in home exercise to make exercise part of routine and to increase amount of physical activity.;Long Term: Exercising regularly at least 3-5 days a week.       Increase Strength and Stamina Yes       Intervention Provide advice, education, support and counseling about physical activity/exercise needs.;Develop an individualized exercise prescription for aerobic and resistive training based on initial evaluation findings, risk stratification, comorbidities and participant's personal goals.       Expected Outcomes Short Term: Increase workloads from initial exercise prescription for resistance,  speed, and METs.;Short Term: Perform resistance training exercises routinely during rehab and add in resistance training at home;Long Term: Improve cardiorespiratory fitness, muscular endurance and strength as measured by increased METs and functional capacity ( )       Able to understand and use rate of perceived exertion (RPE) scale Yes       Intervention Provide education and explanation on how to use RPE scale       Expected Outcomes Short Term: Able to use RPE daily in rehab to express subjective intensity level;Long Term:  Able to use RPE to guide intensity level when exercising independently       Able to understand and use Dyspnea scale Yes       Intervention Provide education and explanation on how to use Dyspnea scale       Expected  Outcomes Short Term: Able to use Dyspnea scale daily in rehab to express subjective sense of shortness of breath during exertion;Long Term: Able to use Dyspnea scale to guide intensity level when exercising independently       Knowledge and understanding of Target Heart Rate Range (THRR) Yes       Intervention Provide education and explanation of THRR including how the numbers were predicted and where they are located for reference       Expected Outcomes Short Term: Able to state/look up THRR;Long Term: Able to use THRR to govern intensity when exercising independently;Short Term: Able to use daily as guideline for intensity in rehab       Able to check pulse independently Yes       Intervention Provide education and demonstration on how to check pulse in carotid and radial arteries.;Review the importance of being able to check your own pulse for safety during independent exercise       Expected Outcomes Short Term: Able to explain why pulse checking is important during independent exercise;Long Term: Able to check pulse independently and accurately       Understanding of Exercise Prescription Yes       Intervention Provide education, explanation, and written materials on patient's individual exercise prescription       Expected Outcomes Short Term: Able to explain program exercise prescription;Long Term: Able to explain home exercise prescription to exercise independently                Copy of goals given to participant.

## 2022-06-14 NOTE — Progress Notes (Signed)
Pulmonary Individual Treatment Plan  Patient Details  Name: [name] MRN: [id number] Date of Birth: 03/01/1940 Referring Provider:   Flowsheet Row Pulmonary Rehab from 06/14/2022 in [facility] Cardiac and Pulmonary Rehab  Referring Provider Bensimhon       Initial Encounter Date:  Flowsheet Row Pulmonary Rehab from 06/14/2022 in [facility] Cardiac and Pulmonary Rehab  Date 06/14/22       Visit Diagnosis: Pulmonary HTN  Patient's Home Medications on Admission:  Current Outpatient Medications:    albuterol (VENTOLIN HFA) 108 (90 Base) MCG/ACT inhaler, Inhale 1-2 puffs into the lungs every 6 (six) hours as needed., Disp: 8 g, Rfl: 2   amiodarone (PACERONE) 200 MG tablet, Take 0.5 tablets (100 mg total) by mouth daily., Disp: 45 tablet, Rfl: 1   amLODipine (NORVASC) 5 MG tablet, Take 1 tablet (5 mg total) by mouth daily., Disp: 90 tablet, Rfl: 1   apixaban (ELIQUIS) 2.5 MG TABS tablet, Take 1 tablet (2.5 mg total) by mouth 2 (two) times daily., Disp: 60 tablet, Rfl: 3   azelastine (ASTELIN) 0.1 % nasal spray, Place 1 spray into both nostrils 2 (two) times daily. Use in each nostril as directed, Disp: 30 mL, Rfl: 5   benzonatate (TESSALON) 200 MG capsule, Take 1 capsule (200 mg total) by mouth 3 (three) times daily as needed., Disp: 45 capsule, Rfl: 1   cetirizine (ZYRTEC) 10 MG tablet, Take 10 mg by mouth daily., Disp: , Rfl:    chlorpheniramine (CHLOR-TRIMETON) 4 MG tablet, Take 4 mg by mouth daily., Disp: , Rfl:    dapagliflozin propanediol (FARXIGA) 10 MG TABS tablet, Take 1 tablet (10 mg total) by mouth daily before breakfast., Disp: 30 tablet, Rfl: 3   doxycycline (VIBRA-TABS) 100 MG tablet, Take 1 tablet (100 mg total) by mouth 2 (two) times daily. (Patient not taking: Reported on 06/01/2022), Disp: 14 tablet, Rfl: 0   EPINEPHrine 0.3 mg/0.3 mL IJ SOAJ injection, Inject 0.3 mg into the muscle as needed for anaphylaxis., Disp: , Rfl:    famotidine (PEPCID) 20 MG tablet, Take 20 mg by mouth  daily., Disp: , Rfl:    fexofenadine (ALLEGRA) 180 MG tablet, Take 180 mg by mouth daily. (Patient not taking: Reported on 06/01/2022), Disp: , Rfl:    fluticasone furoate-vilanterol (BREO ELLIPTA) 100-25 MCG/ACT AEPB, Inhale 1 puff into the lungs daily., Disp: 30 each, Rfl: 5   furosemide (LASIX) 20 MG tablet, Take 1 tablet (20 mg total) by mouth daily., Disp: 180 tablet, Rfl: 2   glucosamine-chondroitin 500-400 MG tablet, Take 1 tablet by mouth 2 (two) times daily. 1 pm, Disp: , Rfl:    KLOR-CON M20 20 MEQ tablet, TAKE 1 TABLET (20 MEQ) TWICE EVERY OTHER DAY ALTERNATING WITH 1 TABLET (20 MEQ) ONCE EVERY OTHER DAY, Disp: 135 tablet, Rfl: 1   Krill Oil 500 MG CAPS, Take by mouth., Disp: , Rfl:    Multiple Vitamin (MULTIVITAMIN) capsule, Take 1 capsule by mouth daily., Disp: , Rfl:    pravastatin (PRAVACHOL) 40 MG tablet, TAKE 1 TABLET BY MOUTH EVERYDAY AT BEDTIME (Patient taking differently: Once a day with Lasix), Disp: 90 tablet, Rfl: 3   prednisoLONE acetate (PRED FORTE) 1 % ophthalmic suspension, Apply to eye., Disp: , Rfl:    predniSONE (DELTASONE) 20 MG tablet, Take 1 tablet (20 mg total) by mouth daily with breakfast., Disp: 5 tablet, Rfl: 0   sertraline (ZOLOFT) 25 MG tablet, TAKE 1 TABLET BY MOUTH EVERY DAY, Disp: 100 tablet, Rfl: 3   sodium chloride (  MURO 128) 5 % ophthalmic solution, PLACE 1 DROP INTO RIGHT EYE 4 TIMES A DAY, Disp: , Rfl:    triamcinolone (NASACORT ALLERGY 24HR) 55 MCG/ACT AERO nasal inhaler, Place 2 sprays into the nose daily. (Patient not taking: Reported on 06/01/2022), Disp: , Rfl:    valsartan (DIOVAN) 160 MG tablet, Take 1 tablet (160 mg total) by mouth 2 (two) times daily., Disp: 180 tablet, Rfl: 1   vitamin B-12 (CYANOCOBALAMIN) 500 MCG tablet, Take 500 mcg by mouth daily., Disp: , Rfl:   Past Medical History: Past Medical History:  Diagnosis Date   Adenomatous colon polyp 2005   Allergy    perennial ; shots from Dr Corinda Gubler   Aortic insufficiency     Hyperlipidemia    Hypertension    Murmur    a.  04/2011 Echo:  EF 55-60%, mild-mod ai/mr   Squamous cell carcinoma of skin 08/05/2020   left pretibia [facility]   Uterine cancer     Tobacco Use: Social History   Tobacco Use  Smoking Status Never   Passive exposure: Past  Smokeless Tobacco Never    Labs: Review Flowsheet  More data exists      Latest Ref Rng & Units 05/31/2016 06/21/2017 08/14/2018 12/03/2020 12/19/2021  Labs for ITP Cardiac and Pulmonary Rehab  Cholestrol 0 - 200 mg/dL 409  811  914  782  956   LDL (calc) 0 - 99 mg/dL 91  77  71  60  77   HDL-C >39.00 mg/dL 21.30  86.57  84.69  62.95  50.20   Trlycerides 0.0 - 149.0 mg/dL 28.4  13.2  44.0  10.2  68.0   Hemoglobin A1c 4.6 - 6.5 % 5.8  5.8  5.8  - -     Pulmonary Assessment Scores:  Pulmonary Assessment Scores     Row Name 06/14/22 1444         ADL UCSD   ADL Phase Entry     SOB Score total 7     Rest 0     Walk 0     Stairs 1     Bath 0     Dress 0     Shop 0       CAT Score   CAT Score 5       mMRC Score   mMRC Score 2              UCSD: Self-administered rating of dyspnea associated with activities of daily living (ADLs) 6-point scale (0 = "not at all" to 5 = "maximal or unable to do because of breathlessness")  Scoring Scores range from 0 to 120.  Minimally important difference is 5 units  CAT: CAT can identify the health impairment of COPD patients and is better correlated with disease progression.  CAT has a scoring range of zero to 40. The CAT score is classified into four groups of low (less than 10), medium (10 - 20), high (21-30) and very high (31-40) based on the impact level of disease on health status. A CAT score over 10 suggests significant symptoms.  A worsening CAT score could be explained by an exacerbation, poor medication adherence, poor inhaler technique, or progression of COPD or comorbid conditions.  CAT MCID is 2 points  mMRC: mMRC (Modified Medical Research Council)  Dyspnea Scale is used to assess the degree of baseline functional disability in patients of respiratory disease due to dyspnea. No minimal important difference is established. A decrease in score of  1 point or greater is considered a positive change.   Pulmonary Function Assessment:   Exercise Target Goals: Exercise Program Goal: Individual exercise prescription set using results from initial 6 min walk test and THRR while considering  patient's activity barriers and safety.   Exercise Prescription Goal: Initial exercise prescription builds to 30-45 minutes a day of aerobic activity, 2-3 days per week.  Home exercise guidelines will be given to patient during program as part of exercise prescription that the participant will acknowledge.  Education: Aerobic Exercise: - Group verbal and visual presentation on the components of exercise prescription. Introduces F.I.T.T principle from ACSM for exercise prescriptions.  Reviews F.I.T.T. principles of aerobic exercise including progression. Written material given at graduation.   Education: Resistance Exercise: - Group verbal and visual presentation on the components of exercise prescription. Introduces F.I.T.T principle from ACSM for exercise prescriptions  Reviews F.I.T.T. principles of resistance exercise including progression. Written material given at graduation.    Education: Exercise & Equipment Safety: - Individual verbal instruction and demonstration of equipment use and safety with use of the equipment. Flowsheet Row Pulmonary Rehab from 06/14/2022 in [facility] Cardiac and Pulmonary Rehab  Date 06/14/22  Educator [facility]  Instruction Review Code 1- Verbalizes Understanding       Education: Exercise Physiology & General Exercise Guidelines: - Group verbal and written instruction with models to review the exercise physiology of the cardiovascular system and associated critical values. Provides general exercise guidelines with specific  guidelines to those with heart or lung disease.    Education: Flexibility, Balance, Mind/Body Relaxation: - Group verbal and visual presentation with interactive activity on the components of exercise prescription. Introduces F.I.T.T principle from ACSM for exercise prescriptions. Reviews F.I.T.T. principles of flexibility and balance exercise training including progression. Also discusses the mind body connection.  Reviews various relaxation techniques to help reduce and manage stress (i.e. Deep breathing, progressive muscle relaxation, and visualization). Balance handout provided to take home. Written material given at graduation.   Activity Barriers & Risk Stratification:  Activity Barriers & Cardiac Risk Stratification - 06/14/22 1342       Activity Barriers & Cardiac Risk Stratification   Activity Barriers Back Problems             6 Minute Walk:  6 Minute Walk     Row Name 06/14/22 1321         6 Minute Walk   Phase Initial     Distance 1080 feet     Walk Time 6 minutes     # of Rest Breaks 0     MPH 2.05     METS 1.97     RPE 12     Perceived Dyspnea  0     VO2 Peak 6.88     Symptoms No     Resting HR 71 bpm     Resting BP 138/82     Resting Oxygen Saturation  96 %     Exercise Oxygen Saturation  during 6 min walk 94 %     Max Ex. HR 80 bpm     Max Ex. BP 160/62     2 Minute Post BP 142/72       Interval HR   1 Minute HR 77     2 Minute HR 78     3 Minute HR 78     4 Minute HR 79     5 Minute HR 79     6 Minute HR 80  2 Minute Post HR 69     Interval Heart Rate? Yes       Interval Oxygen   Interval Oxygen? Yes     Baseline Oxygen Saturation % 96 %     1 Minute Oxygen Saturation % 97 %     1 Minute Liters of Oxygen 0 L     2 Minute Oxygen Saturation % 94 %     2 Minute Liters of Oxygen 0 L     3 Minute Oxygen Saturation % 94 %     3 Minute Liters of Oxygen 0 L     4 Minute Oxygen Saturation % 95 %     4 Minute Liters of Oxygen 0 L     5  Minute Oxygen Saturation % 96 %     5 Minute Liters of Oxygen 0 L     6 Minute Oxygen Saturation % 97 %     6 Minute Liters of Oxygen 0 L     2 Minute Post Oxygen Saturation % 98 %     2 Minute Post Liters of Oxygen 0 L             Oxygen Initial Assessment:  Oxygen Initial Assessment - 06/14/22 1444       Home Oxygen   Home Oxygen Device None    Sleep Oxygen Prescription Continuous    Liters per minute 2    Home Exercise Oxygen Prescription None    Home Resting Oxygen Prescription None      Initial 6 min Walk   Oxygen Used None      Program Oxygen Prescription   Program Oxygen Prescription None      Intervention   Short Term Goals To learn and understand importance of monitoring SPO2 with pulse oximeter and demonstrate accurate use of the pulse oximeter.;To learn and understand importance of maintaining oxygen saturations>88%;To learn and demonstrate proper pursed lip breathing techniques or other breathing techniques. ;To learn and demonstrate proper use of respiratory medications    Long  Term Goals Verbalizes importance of monitoring SPO2 with pulse oximeter and return demonstration;Maintenance of O2 saturations>88%;Exhibits proper breathing techniques, such as pursed lip breathing or other method taught during program session;Compliance with respiratory medication;Demonstrates proper use of MDI's             Oxygen Re-Evaluation:   Oxygen Discharge (Final Oxygen Re-Evaluation):   Initial Exercise Prescription:  Initial Exercise Prescription - 06/14/22 1300       Date of Initial Exercise RX and Referring Provider   Date 06/14/22    Referring Provider Bensimhon      Oxygen   Maintain Oxygen Saturation 88% or higher      Recumbant Bike   Level 2    RPM 50    Watts 15    Minutes 15      NuStep   Level 2    SPM 80    Minutes 15    METs 1.97      REL-XR   Level 2    Speed 50    Minutes 15    METs 1.97      Track   Laps 30    Minutes 15     METs 2.63      Prescription Details   Frequency (times per week) 2    Duration Progress to 30 minutes of continuous aerobic without signs/symptoms of physical distress      Intensity   THRR 40-80% of Max Heartrate  97-124    Ratings of Perceived Exertion 11-13    Perceived Dyspnea 0-4      Progression   Progression Continue to progress workloads to maintain intensity without signs/symptoms of physical distress.      Resistance Training   Training Prescription Yes    Weight 2    Reps 10-15             Perform Capillary Blood Glucose checks as needed.  Exercise Prescription Changes:   Exercise Prescription Changes     Row Name 06/14/22 1300             Response to Exercise   Blood Pressure (Admit) 138/82       Blood Pressure (Exercise) 160/62       Blood Pressure (Exit) 142/72       Heart Rate (Admit) 71 bpm       Heart Rate (Exercise) 80 bpm       Heart Rate (Exit) 69 bpm       Oxygen Saturation (Admit) 96 %       Oxygen Saturation (Exercise) 94 %       Oxygen Saturation (Exit) 98 %       Rating of Perceived Exertion (Exercise) 12       Perceived Dyspnea (Exercise) 0       Symptoms none       Comments 6 MWT results                Exercise Comments:   Exercise Goals and Review:   Exercise Goals     Row Name 06/14/22 1346             Exercise Goals   Increase Physical Activity Yes       Intervention Provide advice, education, support and counseling about physical activity/exercise needs.;Develop an individualized exercise prescription for aerobic and resistive training based on initial evaluation findings, risk stratification, comorbidities and participant's personal goals.       Expected Outcomes Short Term: Attend rehab on a regular basis to increase amount of physical activity.;Long Term: Add in home exercise to make exercise part of routine and to increase amount of physical activity.;Long Term: Exercising regularly at least 3-5 days a week.        Increase Strength and Stamina Yes       Intervention Provide advice, education, support and counseling about physical activity/exercise needs.;Develop an individualized exercise prescription for aerobic and resistive training based on initial evaluation findings, risk stratification, comorbidities and participant's personal goals.       Expected Outcomes Short Term: Increase workloads from initial exercise prescription for resistance, speed, and METs.;Short Term: Perform resistance training exercises routinely during rehab and add in resistance training at home;Long Term: Improve cardiorespiratory fitness, muscular endurance and strength as measured by increased METs and functional capacity ( )       Able to understand and use rate of perceived exertion (RPE) scale Yes       Intervention Provide education and explanation on how to use RPE scale       Expected Outcomes Short Term: Able to use RPE daily in rehab to express subjective intensity level;Long Term:  Able to use RPE to guide intensity level when exercising independently       Able to understand and use Dyspnea scale Yes       Intervention Provide education and explanation on how to use Dyspnea scale       Expected Outcomes Short Term: Able to use  Dyspnea scale daily in rehab to express subjective sense of shortness of breath during exertion;Long Term: Able to use Dyspnea scale to guide intensity level when exercising independently       Knowledge and understanding of Target Heart Rate Range (THRR) Yes       Intervention Provide education and explanation of THRR including how the numbers were predicted and where they are located for reference       Expected Outcomes Short Term: Able to state/look up THRR;Long Term: Able to use THRR to govern intensity when exercising independently;Short Term: Able to use daily as guideline for intensity in rehab       Able to check pulse independently Yes       Intervention Provide education and  demonstration on how to check pulse in carotid and radial arteries.;Review the importance of being able to check your own pulse for safety during independent exercise       Expected Outcomes Short Term: Able to explain why pulse checking is important during independent exercise;Long Term: Able to check pulse independently and accurately       Understanding of Exercise Prescription Yes       Intervention Provide education, explanation, and written materials on patient's individual exercise prescription       Expected Outcomes Short Term: Able to explain program exercise prescription;Long Term: Able to explain home exercise prescription to exercise independently                Exercise Goals Re-Evaluation :   Discharge Exercise Prescription (Final Exercise Prescription Changes):  Exercise Prescription Changes - 06/14/22 1300       Response to Exercise   Blood Pressure (Admit) 138/82    Blood Pressure (Exercise) 160/62    Blood Pressure (Exit) 142/72    Heart Rate (Admit) 71 bpm    Heart Rate (Exercise) 80 bpm    Heart Rate (Exit) 69 bpm    Oxygen Saturation (Admit) 96 %    Oxygen Saturation (Exercise) 94 %    Oxygen Saturation (Exit) 98 %    Rating of Perceived Exertion (Exercise) 12    Perceived Dyspnea (Exercise) 0    Symptoms none    Comments 6 MWT results             Nutrition:  Target Goals: Understanding of nutrition guidelines, daily intake of sodium 1500mg , cholesterol 200mg , calories 30% from fat and 7% or less from saturated fats, daily to have 5 or more servings of fruits and vegetables.  Education: All About Nutrition: -Group instruction provided by verbal, written material, interactive activities, discussions, models, and posters to present general guidelines for heart healthy nutrition including fat, fiber, MyPlate, the role of sodium in heart healthy nutrition, utilization of the nutrition label, and utilization of this knowledge for meal planning. Follow up  email sent as well. Written material given at graduation. Flowsheet Row Pulmonary Rehab from 06/14/2022 in [facility] Cardiac and Pulmonary Rehab  Education need identified 06/14/22       Biometrics:  Pre Biometrics - 06/14/22 1346       Pre Biometrics   Height  (1.6 m)    Weight 126 lb 11.2 oz (57.5 kg)    Waist Circumference 32.5 inches    Hip Circumference 37 inches    Waist to Hip Ratio 0.88 %    BMI (Calculated) 22.45    Single Leg Stand 6.81 seconds              Nutrition Therapy Plan  and Nutrition Goals:  Nutrition Therapy & Goals - 06/14/22 1442       Intervention Plan   Intervention Prescribe, educate and counsel regarding individualized specific dietary modifications aiming towards targeted core components such as weight, hypertension, lipid management, diabetes, heart failure and other comorbidities.    Expected Outcomes Short Term Goal: Understand basic principles of dietary content, such as calories, fat, sodium, cholesterol and nutrients.;Short Term Goal: A plan has been developed with personal nutrition goals set during dietitian appointment.;Long Term Goal: Adherence to prescribed nutrition plan.             Nutrition Assessments:  MEDIFICTS Score Key: ?70 Need to make dietary changes  40-70 Heart Healthy Diet ? 40 Therapeutic Level Cholesterol Diet  Flowsheet Row Pulmonary Rehab from 06/14/2022 in [facility] Cardiac and Pulmonary Rehab  Picture Your Plate Total Score on Admission 68      Picture Your Plate Scores: <16 Unhealthy dietary pattern with much room for improvement. 41-50 Dietary pattern unlikely to meet recommendations for good health and room for improvement. 51-60 More healthful dietary pattern, with some room for improvement.  >60 Healthy dietary pattern, although there may be some specific behaviors that could be improved.   Nutrition Goals Re-Evaluation:   Nutrition Goals Discharge (Final Nutrition Goals  Re-Evaluation):   Psychosocial: Target Goals: Acknowledge presence or absence of significant depression and/or stress, maximize coping skills, provide positive support system. Participant is able to verbalize types and ability to use techniques and skills needed for reducing stress and depression.   Education: Stress, Anxiety, and Depression - Group verbal and visual presentation to define topics covered.  Reviews how body is impacted by stress, anxiety, and depression.  Also discusses healthy ways to reduce stress and to treat/manage anxiety and depression.  Written material given at graduation.   Education: Sleep Hygiene -Provides group verbal and written instruction about how sleep can affect your health.  Define sleep hygiene, discuss sleep cycles and impact of sleep habits. Review good sleep hygiene tips.    Initial Review & Psychosocial Screening:  Initial Psych Review & Screening - 06/07/22 1009       Initial Review   Current issues with Current Stress Concerns      Family Dynamics   Good Support System? Yes   2 daughters, neighbor     Barriers   Psychosocial barriers to participate in program There are no identifiable barriers or psychosocial needs.      Screening Interventions   Interventions Encouraged to exercise;To provide support and resources with identified psychosocial needs;Provide feedback about the scores to participant    Expected Outcomes Short Term goal: Utilizing psychosocial counselor, staff and physician to assist with identification of specific Stressors or current issues interfering with healing process. Setting desired goal for each stressor or current issue identified.;Long Term Goal: Stressors or current issues are controlled or eliminated.;Short Term goal: Identification and review with participant of any Quality of Life or Depression concerns found by scoring the questionnaire.;Long Term goal: The participant improves quality of Life and PHQ9 Scores as  seen by post scores and/or verbalization of changes             Quality of Life Scores:  Scores of 19 and below usually indicate a poorer quality of life in these areas.  A difference of  2-3 points is a clinically meaningful difference.  A difference of 2-3 points in the total score of the Quality of Life Index has been associated with significant improvement in  overall quality of life, self-image, physical symptoms, and general health in studies assessing change in quality of life.  PHQ-9: Review Flowsheet  More data exists      06/14/2022 12/19/2021 05/31/2016 05/28/2015 05/20/2014  Depression screen PHQ 2/9  Decreased Interest 0 0 0 0 0 0 0  Down, Depressed, Hopeless 0 0 0 0 0 0 0  PHQ - 2 Score 0 0 0 0 0 0 0  Altered sleeping 0 - - - -  Tired, decreased energy 1 - - - -  Change in appetite 1 - - - -  Feeling bad or failure about yourself  0 - - - -  Trouble concentrating 0 - - - -  Moving slowly or fidgety/restless 1 - - - -  Suicidal thoughts 0 - - - -  PHQ-9 Score 3 - - - -  Difficult doing work/chores Not difficult at all - - - -   Interpretation of Total Score  Total Score Depression Severity:  1-4 = Minimal depression, 5-9 = Mild depression, 10-14 = Moderate depression, 15-19 = Moderately severe depression, 20-27 = Severe depression   Psychosocial Evaluation and Intervention:  Psychosocial Evaluation - 06/07/22 1036       Psychosocial Evaluation & Interventions   Interventions Encouraged to exercise with the program and follow exercise prescription    Comments Taneshia is coming to pulmonary rehab with the diagnosis of pulmonary HTN. She has been managing her heart failure symptoms well. She has noticed her breathing has gotten better with fluid managment. She does report her biggest stressor coming to the program is "white coat syndrome," staff reassured her that we will take it day by day and keep that in mind with her blood pressure readings. She has a very supportive  family and neighbor who are there if she needs anything. She lives alone and does most of her own care and housekeeping.    Expected Outcomes Short: attend pulmonary rehab for education and exercise. Long: develop and maintain positive self care habits.    Continue Psychosocial Services  Follow up required by staff             Psychosocial Re-Evaluation:   Psychosocial Discharge (Final Psychosocial Re-Evaluation):   Education: Education Goals: Education classes will be provided on a weekly basis, covering required topics. Participant will state understanding/return demonstration of topics presented.  Learning Barriers/Preferences:  Learning Barriers/Preferences - 06/07/22 1006       Learning Barriers/Preferences   Learning Barriers Sight    Learning Preferences None             General Pulmonary Education Topics:  Infection Prevention: - Provides verbal and written material to individual with discussion of infection control including proper hand washing and proper equipment cleaning during exercise session. Flowsheet Row Pulmonary Rehab from 06/14/2022 in [facility] Cardiac and Pulmonary Rehab  Date 06/14/22  Educator [facility]  Instruction Review Code 1- Verbalizes Understanding       Falls Prevention: - Provides verbal and written material to individual with discussion of falls prevention and safety. Flowsheet Row Pulmonary Rehab from 06/14/2022 in [facility] Cardiac and Pulmonary Rehab  Date 06/14/22  Educator [facility]  Instruction Review Code 1- Verbalizes Understanding       Chronic Lung Disease Review: - Group verbal instruction with posters, models, PowerPoint presentations and videos,  to review new updates, new respiratory medications, new advancements in procedures and treatments. Providing information on websites and "800" numbers for continued self-education. Includes information about supplement oxygen, available  portable oxygen systems, continuous and intermittent flow  rates, oxygen safety, concentrators, and Medicare reimbursement for oxygen. Explanation of Pulmonary Drugs, including class, frequency, complications, importance of spacers, rinsing mouth after steroid MDI's, and proper cleaning methods for nebulizers. Review of basic lung anatomy and physiology related to function, structure, and complications of lung disease. Review of risk factors. Discussion about methods for diagnosing sleep apnea and types of masks and machines for OSA. Includes a review of the use of types of environmental controls: home humidity, furnaces, filters, dust mite/pet prevention, HEPA vacuums. Discussion about weather changes, air quality and the benefits of nasal washing. Instruction on Warning signs, infection symptoms, calling MD promptly, preventive modes, and value of vaccinations. Review of effective airway clearance, coughing and/or vibration techniques. Emphasizing that all should Create an Action Plan. Written material given at graduation. Flowsheet Row Pulmonary Rehab from 06/14/2022 in [facility] Cardiac and Pulmonary Rehab  Education need identified 06/14/22       AED/CPR: - Group verbal and written instruction with the use of models to demonstrate the basic use of the AED with the basic ABC's of resuscitation.    Anatomy and Cardiac Procedures: - Group verbal and visual presentation and models provide information about basic cardiac anatomy and function. Reviews the testing methods done to diagnose heart disease and the outcomes of the test results. Describes the treatment choices: Medical Management, Angioplasty, or Coronary Bypass Surgery for treating various heart conditions including Myocardial Infarction, Angina, Valve Disease, and Cardiac Arrhythmias.  Written material given at graduation.   Medication Safety: - Group verbal and visual instruction to review commonly prescribed medications for heart and lung disease. Reviews the medication, class of the drug, and side  effects. Includes the steps to properly store meds and maintain the prescription regimen.  Written material given at graduation.   Other: -Provides group and verbal instruction on various topics (see comments)   Knowledge Questionnaire Score:  Knowledge Questionnaire Score - 06/14/22 1440       Knowledge Questionnaire Score   Pre Score 8/18              Core Components/Risk Factors/Patient Goals at Admission:  Personal Goals and Risk Factors at Admission - 06/14/22 1442       Core Components/Risk Factors/Patient Goals on Admission    Weight Management Yes    Intervention Weight Management: Develop a combined nutrition and exercise program designed to reach desired caloric intake, while maintaining appropriate intake of nutrient and fiber, sodium and fats, and appropriate energy expenditure required for the weight goal.;Weight Management: Provide education and appropriate resources to help participant work on and attain dietary goals.    Admit Weight 126 lb 11.2 oz (57.5 kg)    Goal Weight: Short Term 127 lb (57.6 kg)    Goal Weight: Long Term 127 lb (57.6 kg)    Expected Outcomes Short Term: Continue to assess and modify interventions until short term weight is achieved;Long Term: Adherence to nutrition and physical activity/exercise program aimed toward attainment of established weight goal;Weight Maintenance: Understanding of the daily nutrition guidelines, which includes 25-35% calories from fat, 7% or less cal from saturated fats, less than 200mg  cholesterol, less than 1.5gm of sodium, & 5 or more servings of fruits and vegetables daily;Understanding recommendations for meals to include 15-35% energy as protein, 25-35% energy from fat, 35-60% energy from carbohydrates, less than 200mg  of dietary cholesterol, 20-35 gm of total fiber daily;Understanding of distribution of calorie intake throughout the day with the consumption of 4-5  meals/snacks    Heart Failure Yes    Intervention  Provide a combined exercise and nutrition program that is supplemented with education, support and counseling about heart failure. Directed toward relieving symptoms such as shortness of breath, decreased exercise tolerance, and extremity edema.    Expected Outcomes Improve functional capacity of life;Short term: Attendance in program 2-3 days a week with increased exercise capacity. Reported lower sodium intake. Reported increased fruit and vegetable intake. Reports medication compliance.;Short term: Daily weights obtained and reported for increase. Utilizing diuretic protocols set by physician.;Long term: Adoption of self-care skills and reduction of barriers for early signs and symptoms recognition and intervention leading to self-care maintenance.    Hypertension Yes    Intervention Provide education on lifestyle modifcations including regular physical activity/exercise, weight management, moderate sodium restriction and increased consumption of fresh fruit, vegetables, and low fat dairy, alcohol moderation, and smoking cessation.;Monitor prescription use compliance.    Expected Outcomes Short Term: Continued assessment and intervention until BP is < 140/2mm HG in hypertensive participants. < 130/69mm HG in hypertensive participants with diabetes, heart failure or chronic kidney disease.;Long Term: Maintenance of blood pressure at goal levels.    Lipids Yes    Intervention Provide education and support for participant on nutrition & aerobic/resistive exercise along with prescribed medications to achieve LDL 70mg , HDL >40mg .    Expected Outcomes Short Term: Participant states understanding of desired cholesterol values and is compliant with medications prescribed. Participant is following exercise prescription and nutrition guidelines.;Long Term: Cholesterol controlled with medications as prescribed, with individualized exercise RX and with personalized nutrition plan. Value goals: LDL < 70mg , HDL > 40  mg.             Education:Diabetes - Individual verbal and written instruction to review signs/symptoms of diabetes, desired ranges of glucose level fasting, after meals and with exercise. Acknowledge that pre and post exercise glucose checks will be done for 3 sessions at entry of program.   Know Your Numbers and Heart Failure: - Group verbal and visual instruction to discuss disease risk factors for cardiac and pulmonary disease and treatment options.  Reviews associated critical values for Overweight/Obesity, Hypertension, Cholesterol, and Diabetes.  Discusses basics of heart failure: signs/symptoms and treatments.  Introduces Heart Failure Zone chart for action plan for heart failure.  Written material given at graduation.   Core Components/Risk Factors/Patient Goals Review:    Core Components/Risk Factors/Patient Goals at Discharge (Final Review):    ITP Comments:  ITP Comments     Row Name 06/07/22 1048 06/14/22 1321         ITP Comments Initial phone call completed. Diagnosis can be found in [facility] 4/3. EP Orientation scheduled for Wednesday 4/17 at 10:30. Completed and gym orientation. Initial ITP created and sent for review to Dr. Jinny Sanders, Medical Director.               Comments: initial ITP

## 2022-06-14 NOTE — Telephone Encounter (Signed)
I faxed notes from 03/20/22, 05/12/22, 06/01/22 to fax # [phone] per request. I received confirmation that the fax was received

## 2022-06-19 ENCOUNTER — Ambulatory Visit: Payer: Medicare Other | Attending: Otolaryngology

## 2022-06-19 DIAGNOSIS — Z9981 Dependence on supplemental oxygen: Secondary | ICD-10-CM | POA: Diagnosis not present

## 2022-06-19 DIAGNOSIS — I509 Heart failure, unspecified: Secondary | ICD-10-CM | POA: Insufficient documentation

## 2022-06-19 DIAGNOSIS — J45909 Unspecified asthma, uncomplicated: Secondary | ICD-10-CM | POA: Insufficient documentation

## 2022-06-19 DIAGNOSIS — I110 Hypertensive heart disease with heart failure: Secondary | ICD-10-CM | POA: Diagnosis not present

## 2022-06-19 DIAGNOSIS — G4733 Obstructive sleep apnea (adult) (pediatric): Secondary | ICD-10-CM | POA: Insufficient documentation

## 2022-06-19 DIAGNOSIS — I4891 Unspecified atrial fibrillation: Secondary | ICD-10-CM | POA: Insufficient documentation

## 2022-06-20 ENCOUNTER — Encounter: Payer: Medicare Other | Admitting: *Deleted

## 2022-06-20 DIAGNOSIS — I480 Paroxysmal atrial fibrillation: Secondary | ICD-10-CM | POA: Diagnosis not present

## 2022-06-20 DIAGNOSIS — J3081 Allergic rhinitis due to animal (cat) (dog) hair and dander: Secondary | ICD-10-CM | POA: Diagnosis not present

## 2022-06-20 DIAGNOSIS — J3089 Other allergic rhinitis: Secondary | ICD-10-CM | POA: Diagnosis not present

## 2022-06-20 DIAGNOSIS — J301 Allergic rhinitis due to pollen: Secondary | ICD-10-CM | POA: Diagnosis not present

## 2022-06-20 DIAGNOSIS — I272 Pulmonary hypertension, unspecified: Secondary | ICD-10-CM | POA: Diagnosis not present

## 2022-06-20 DIAGNOSIS — J4530 Mild persistent asthma, uncomplicated: Secondary | ICD-10-CM | POA: Diagnosis not present

## 2022-06-20 NOTE — Progress Notes (Signed)
Daily Session Note  Patient Details  Name: [name] MRN: [id number] Date of Birth: 14-Dec-1940 Referring Provider:   Flowsheet Row Pulmonary Rehab from 06/14/2022 in [facility] Cardiac and Pulmonary Rehab  Referring Provider Bensimhon       Encounter Date: 06/20/2022  Check In:  Session Check In - 06/20/22 0959       Check-In   Supervising physician immediately available to respond to emergencies See telemetry face sheet for immediately available ER MD    Location [facility]-Cardiac & Pulmonary Rehab    Staff Present Lanny Hurst, RN, ADN;Jessica Juanetta Gosling, MA, RCEP, CCRP, Zackery Barefoot, MS, ACSM CEP, Exercise Physiologist    Virtual Visit No    Medication changes reported     No    Fall or balance concerns reported    No    Warm-up and Cool-down Performed on first and last piece of equipment    Resistance Training Performed Yes    VAD Patient? No    PAD/SET Patient? No      Pain Assessment   Currently in Pain? No/denies                Social History   Tobacco Use  Smoking Status Never   Passive exposure: Past  Smokeless Tobacco Never    Goals Met:  Independence with exercise equipment Exercise tolerated well No report of concerns or symptoms today Strength training completed today  Goals Unmet:  Not Applicable  Comments: First full day of exercise!  Patient was oriented to gym and equipment including functions, settings, policies, and procedures.  Patient's individual exercise prescription and treatment plan were reviewed.  All starting workloads were established based on the results of the 6 minute walk test done at initial orientation visit.  The plan for exercise progression was also introduced and progression will be customized based on patient's performance and goals.    Dr. Bethann Punches is Medical Director for [facility] Cardiac Rehabilitation.  Dr. Vida Rigger is Medical Director for [facility] Pulmonary Rehabilitation.

## 2022-06-22 ENCOUNTER — Encounter: Payer: Medicare Other | Admitting: *Deleted

## 2022-06-22 DIAGNOSIS — I272 Pulmonary hypertension, unspecified: Secondary | ICD-10-CM | POA: Diagnosis not present

## 2022-06-22 DIAGNOSIS — I480 Paroxysmal atrial fibrillation: Secondary | ICD-10-CM | POA: Diagnosis not present

## 2022-06-22 DIAGNOSIS — J4530 Mild persistent asthma, uncomplicated: Secondary | ICD-10-CM | POA: Diagnosis not present

## 2022-06-22 NOTE — Progress Notes (Signed)
Daily Session Note  Patient Details  Name: [name] MRN: [id number] Date of Birth: 1940/11/25 Referring Provider:   Flowsheet Row Pulmonary Rehab from 06/14/2022 in [facility] Cardiac and Pulmonary Rehab  Referring Provider Bensimhon       Encounter Date: 06/22/2022  Check In:  Session Check In - 06/22/22 1040       Check-In   Supervising physician immediately available to respond to emergencies See telemetry face sheet for immediately available ER MD    Location [facility]-Cardiac & Pulmonary Rehab    Staff Present Lanny Hurst, RN, ADN;Denise Rhew, PhD, RN, CNS, Josephine Cables, BS, Exercise Physiologist    Virtual Visit No    Medication changes reported     No    Fall or balance concerns reported    No    Warm-up and Cool-down Performed on first and last piece of equipment    Resistance Training Performed Yes    VAD Patient? No    PAD/SET Patient? No      Pain Assessment   Currently in Pain? No/denies                Social History   Tobacco Use  Smoking Status Never   Passive exposure: Past  Smokeless Tobacco Never    Goals Met:  Independence with exercise equipment Exercise tolerated well No report of concerns or symptoms today Strength training completed today  Goals Unmet:  Not Applicable  Comments: Pt able to follow exercise prescription today without complaint.  Will continue to monitor for progression.    Dr. Bethann Punches is Medical Director for [facility] Cardiac Rehabilitation.  Dr. Vida Rigger is Medical Director for [facility] Pulmonary Rehabilitation.

## 2022-06-26 DIAGNOSIS — J3089 Other allergic rhinitis: Secondary | ICD-10-CM | POA: Diagnosis not present

## 2022-06-27 ENCOUNTER — Encounter: Payer: Medicare Other | Admitting: *Deleted

## 2022-06-27 DIAGNOSIS — I272 Pulmonary hypertension, unspecified: Secondary | ICD-10-CM | POA: Diagnosis not present

## 2022-06-27 DIAGNOSIS — I480 Paroxysmal atrial fibrillation: Secondary | ICD-10-CM | POA: Diagnosis not present

## 2022-06-27 DIAGNOSIS — J4530 Mild persistent asthma, uncomplicated: Secondary | ICD-10-CM | POA: Diagnosis not present

## 2022-06-27 NOTE — Progress Notes (Signed)
Daily Session Note  Patient Details  Name: [name] MRN: [id number] Date of Birth: 08/13/1940 Referring Provider:   Flowsheet Row Pulmonary Rehab from 06/14/2022 in [facility] Cardiac and Pulmonary Rehab  Referring Provider Bensimhon       Encounter Date: 06/27/2022  Check In:  Session Check In - 06/27/22 1016       Check-In   Supervising physician immediately available to respond to emergencies See telemetry face sheet for immediately available ER MD    Location [facility]-Cardiac & Pulmonary Rehab    Staff Present Cora Collum, RN, BSN, CCRP;Jessica Dugger, MA, RCEP, CCRP, Zackery Barefoot, MS, ACSM CEP, Exercise Physiologist    Virtual Visit No    Medication changes reported     No    Fall or balance concerns reported    No    Warm-up and Cool-down Performed on first and last piece of equipment    Resistance Training Performed Yes    VAD Patient? No    PAD/SET Patient? No      Pain Assessment   Currently in Pain? No/denies                Social History   Tobacco Use  Smoking Status Never   Passive exposure: Past  Smokeless Tobacco Never    Goals Met:  Proper associated with RPD/PD & O2 Sat Independence with exercise equipment Exercise tolerated well No report of concerns or symptoms today  Goals Unmet:  Not Applicable  Comments: Pt able to follow exercise prescription today without complaint.  Will continue to monitor for progression.    Dr. Bethann Punches is Medical Director for [facility] Cardiac Rehabilitation.  Dr. Vida Rigger is Medical Director for [facility] Pulmonary Rehabilitation.

## 2022-06-29 ENCOUNTER — Encounter: Payer: Medicare Other | Attending: Internal Medicine | Admitting: *Deleted

## 2022-06-29 DIAGNOSIS — I272 Pulmonary hypertension, unspecified: Secondary | ICD-10-CM | POA: Diagnosis not present

## 2022-06-29 NOTE — Progress Notes (Signed)
Daily Session Note  Patient Details  Name: [name] MRN: [id number] Date of Birth: 1940-07-27 Referring Provider:   Flowsheet Row Pulmonary Rehab from 06/14/2022 in [facility] Cardiac and Pulmonary Rehab  Referring Provider Bensimhon       Encounter Date: 06/29/2022  Check In:  Session Check In - 06/29/22 1002       Check-In   Supervising physician immediately available to respond to emergencies See telemetry face sheet for immediately available ER MD    Location [facility]-Cardiac & Pulmonary Rehab    Staff Present Susann Givens, RN Genoveva Ill, MS, ACSM CEP, Exercise Physiologist;Laureen Manson Passey, BS, RRT, CPFT;Jessica Juanetta Gosling, MA, RCEP, CCRP, CCET    Virtual Visit No    Medication changes reported     No    Fall or balance concerns reported    No    Warm-up and Cool-down Performed on first and last piece of equipment    Resistance Training Performed Yes    VAD Patient? No    PAD/SET Patient? No      Pain Assessment   Currently in Pain? No/denies                Social History   Tobacco Use  Smoking Status Never   Passive exposure: Past  Smokeless Tobacco Never    Goals Met:  Independence with exercise equipment Exercise tolerated well No report of concerns or symptoms today Strength training completed today  Goals Unmet:  Not Applicable  Comments: Pt able to follow exercise prescription today without complaint.  Will continue to monitor for progression.    Dr. Bethann Punches is Medical Director for [facility] Cardiac Rehabilitation.  Dr. Vida Rigger is Medical Director for [facility] Pulmonary Rehabilitation.

## 2022-06-30 ENCOUNTER — Telehealth (HOSPITAL_BASED_OUTPATIENT_CLINIC_OR_DEPARTMENT_OTHER): Payer: Medicare Other | Admitting: Pulmonary Disease

## 2022-06-30 ENCOUNTER — Telehealth: Payer: Self-pay

## 2022-06-30 DIAGNOSIS — G4734 Idiopathic sleep related nonobstructive alveolar hypoventilation: Secondary | ICD-10-CM

## 2022-06-30 NOTE — Telephone Encounter (Signed)
Pt called to confirm date and time of Echo. Pt was instructed to arrive at 9:45 am and to check-in at the main entrance.

## 2022-06-30 NOTE — Telephone Encounter (Signed)
No sigificant OSA Needs 1 L O2

## 2022-06-30 NOTE — Telephone Encounter (Signed)
Per Dr. Vassie Loll, she does not have sleep apnea.  She does need oxygen nocturnally at 1 L/min.

## 2022-07-03 ENCOUNTER — Ambulatory Visit
Admission: RE | Admit: 2022-07-03 | Discharge: 2022-07-03 | Disposition: A | Discharge status: Home / Self Care | Payer: Medicare Other | Source: Ambulatory Visit | Attending: Internal Medicine | Admitting: Internal Medicine

## 2022-07-03 DIAGNOSIS — I509 Heart failure, unspecified: Secondary | ICD-10-CM | POA: Insufficient documentation

## 2022-07-03 DIAGNOSIS — I083 Combined rheumatic disorders of mitral, aortic and tricuspid valves: Secondary | ICD-10-CM | POA: Diagnosis not present

## 2022-07-03 DIAGNOSIS — I5032 Chronic diastolic (congestive) heart failure: Secondary | ICD-10-CM | POA: Diagnosis not present

## 2022-07-03 DIAGNOSIS — R0602 Shortness of breath: Secondary | ICD-10-CM | POA: Diagnosis not present

## 2022-07-03 DIAGNOSIS — I4891 Unspecified atrial fibrillation: Secondary | ICD-10-CM | POA: Diagnosis not present

## 2022-07-03 DIAGNOSIS — I2510 Atherosclerotic heart disease of native coronary artery without angina pectoris: Secondary | ICD-10-CM | POA: Diagnosis not present

## 2022-07-03 LAB — ECHOCARDIOGRAM COMPLETE
AR max vel: 1.87 cm2
AV Area VTI: 2 cm2
AV Area mean vel: 2.02 cm2
AV Mean grad: 9 mmHg
AV Peak grad: 18.5 mmHg
Ao pk vel: 2.15 m/s
Area-P 1/2: 3.3 cm2
MV M vel: 5.19 m/s
MV Peak grad: 107.7 mmHg
MV VTI: 2.62 cm2
P 1/2 time: 562 msec
Radius: 0.47 cm
S' Lateral: 2.8 cm

## 2022-07-03 NOTE — Progress Notes (Signed)
*  PRELIMINARY RESULTS* Echocardiogram 2D Echocardiogram has been performed.  Carolyne Fiscal 07/03/2022, 11:01 AM

## 2022-07-03 NOTE — Telephone Encounter (Signed)
Lm x1 for patient.  

## 2022-07-04 ENCOUNTER — Encounter: Payer: Medicare Other | Admitting: *Deleted

## 2022-07-04 DIAGNOSIS — I272 Pulmonary hypertension, unspecified: Secondary | ICD-10-CM | POA: Diagnosis not present

## 2022-07-04 NOTE — Progress Notes (Signed)
Daily Session Note  Patient Details  Name: [name] MRN: [id number] Date of Birth: [birth year] Referring Provider:   Flowsheet Row Pulmonary Rehab from 06/14/2022 in [facility] Cardiac and Pulmonary Rehab  Referring Provider Bensimhon       Encounter Date: 07/04/2022  Check In:  Session Check In - 07/04/22 1036       Check-In   Supervising physician immediately available to respond to emergencies See telemetry face sheet for immediately available ER MD    Location [facility]-Cardiac & Pulmonary Rehab    Staff Present Cora Collum, RN, BSN, CCRP;Jessica [place], MA, RCEP, CCRP, Zackery Barefoot, MS, ACSM CEP, Exercise Physiologist    Virtual Visit No    Medication changes reported     No    Fall or balance concerns reported    No    Warm-up and Cool-down Performed on first and last piece of equipment    Resistance Training Performed Yes    VAD Patient? No    PAD/SET Patient? No      Pain Assessment   Currently in Pain? No/denies                Social History   Tobacco Use  Smoking Status Never   Passive exposure: Past  Smokeless Tobacco Never    Goals Met:  Proper associated with RPD/PD & O2 Sat Independence with exercise equipment Exercise tolerated well No report of concerns or symptoms today  Goals Unmet:  Not Applicable  Comments: Pt able to follow exercise prescription today without complaint.  Will continue to monitor for progression.    Dr. Bethann Punches is Medical Director for [facility] Cardiac Rehabilitation.  Dr. Vida Rigger is Medical Director for [facility] Pulmonary Rehabilitation.

## 2022-07-04 NOTE — Telephone Encounter (Signed)
Lm for patient. Will call once more due to nature of call.  

## 2022-07-06 ENCOUNTER — Encounter: Payer: Medicare Other | Admitting: *Deleted

## 2022-07-06 DIAGNOSIS — I272 Pulmonary hypertension, unspecified: Secondary | ICD-10-CM

## 2022-07-06 NOTE — Progress Notes (Signed)
Daily Session Note  Patient Details  Name: [name] MRN: [id number] Date of Birth: [birth year] Referring Provider:   Flowsheet Row Pulmonary Rehab from 06/14/2022 in [facility] Cardiac and Pulmonary Rehab  Referring Provider Bensimhon       Encounter Date: 07/06/2022  Check In:  Session Check In - 07/06/22 0958       Check-In   Supervising physician immediately available to respond to emergencies See telemetry face sheet for immediately available ER MD    Location [facility]-Cardiac & Pulmonary Rehab    Staff Present Lanny Hurst, RN, ADN;Jessica Juanetta Gosling, MA, RCEP, CCRP, Zackery Barefoot, MS, ACSM CEP, Exercise Physiologist    Virtual Visit No    Medication changes reported     No    Fall or balance concerns reported    No    Warm-up and Cool-down Performed on first and last piece of equipment    Resistance Training Performed Yes    VAD Patient? No    PAD/SET Patient? No      Pain Assessment   Currently in Pain? No/denies                Social History   Tobacco Use  Smoking Status Never   Passive exposure: Past  Smokeless Tobacco Never    Goals Met:  Independence with exercise equipment Exercise tolerated well No report of concerns or symptoms today Strength training completed today  Goals Unmet:  Not Applicable  Comments: Pt able to follow exercise prescription today without complaint.  Will continue to monitor for progression.    Dr. Bethann Punches is Medical Director for [facility] Cardiac Rehabilitation.  Dr. Vida Rigger is Medical Director for [facility] Pulmonary Rehabilitation.

## 2022-07-06 NOTE — Telephone Encounter (Signed)
Patient is aware of results and voiced her understanding.  She currently wears 2L QHS. Order placed to [employer] to d/c to 1L.  Nothing further needed.

## 2022-07-06 NOTE — Addendum Note (Signed)
Addended by: Lajoyce Lauber A on: 07/06/2022 11:11 AM   Modules accepted: Orders

## 2022-07-11 ENCOUNTER — Encounter: Payer: Medicare Other | Admitting: *Deleted

## 2022-07-11 DIAGNOSIS — J301 Allergic rhinitis due to pollen: Secondary | ICD-10-CM | POA: Diagnosis not present

## 2022-07-11 DIAGNOSIS — I272 Pulmonary hypertension, unspecified: Secondary | ICD-10-CM | POA: Diagnosis not present

## 2022-07-11 DIAGNOSIS — J3081 Allergic rhinitis due to animal (cat) (dog) hair and dander: Secondary | ICD-10-CM | POA: Diagnosis not present

## 2022-07-11 DIAGNOSIS — J3089 Other allergic rhinitis: Secondary | ICD-10-CM | POA: Diagnosis not present

## 2022-07-11 NOTE — Progress Notes (Signed)
Daily Session Note  Patient Details  Name: [name] MRN: [id number] Date of Birth: [birth year] Referring Provider:   Flowsheet Row Pulmonary Rehab from 06/14/2022 in [facility] Cardiac and Pulmonary Rehab  Referring Provider Bensimhon       Encounter Date: 07/11/2022  Check In:  Session Check In - 07/11/22 1115       Check-In   Supervising physician immediately available to respond to emergencies See telemetry face sheet for immediately available ER MD    Location [facility]-Cardiac & Pulmonary Rehab    Staff Present Cora Collum, RN, BSN, CCRP;Jessica [place], MA, RCEP, CCRP, Zackery Barefoot, MS, ACSM CEP, Exercise Physiologist    Virtual Visit No    Medication changes reported     No    Fall or balance concerns reported    No    Warm-up and Cool-down Performed on first and last piece of equipment    Resistance Training Performed Yes    VAD Patient? No    PAD/SET Patient? No      Pain Assessment   Currently in Pain? No/denies                Social History   Tobacco Use  Smoking Status Never   Passive exposure: Past  Smokeless Tobacco Never    Goals Met:  Proper associated with RPD/PD & O2 Sat Independence with exercise equipment Exercise tolerated well No report of concerns or symptoms today  Goals Unmet:  Not Applicable  Comments: Pt able to follow exercise prescription today without complaint.  Will continue to monitor for progression.    Dr. Bethann Punches is Medical Director for [facility] Cardiac Rehabilitation.  Dr. Vida Rigger is Medical Director for [facility] Pulmonary Rehabilitation.

## 2022-07-12 ENCOUNTER — Encounter: Payer: Self-pay | Admitting: *Deleted

## 2022-07-12 DIAGNOSIS — I272 Pulmonary hypertension, unspecified: Secondary | ICD-10-CM

## 2022-07-12 NOTE — Progress Notes (Signed)
Pulmonary Individual Treatment Plan  Patient Details  Name: [name] MRN: [id number] Date of Birth: 1940-09-13 Referring Provider:   Flowsheet Row Pulmonary Rehab from 06/14/2022 in [facility] Cardiac and Pulmonary Rehab  Referring Provider Bensimhon       Initial Encounter Date:  Flowsheet Row Pulmonary Rehab from 06/14/2022 in [facility] Cardiac and Pulmonary Rehab  Date 06/14/22       Visit Diagnosis: Pulmonary HTN (HCC)  Patient's Home Medications on Admission:  Current Outpatient Medications:    albuterol (VENTOLIN HFA) 108 (90 Base) MCG/ACT inhaler, Inhale 1-2 puffs into the lungs every 6 (six) hours as needed., Disp: 8 g, Rfl: 2   amiodarone (PACERONE) 200 MG tablet, Take 0.5 tablets (100 mg total) by mouth daily., Disp: 45 tablet, Rfl: 1   amLODipine (NORVASC) 5 MG tablet, Take 1 tablet (5 mg total) by mouth daily., Disp: 90 tablet, Rfl: 1   apixaban (ELIQUIS) 2.5 MG TABS tablet, Take 1 tablet (2.5 mg total) by mouth 2 (two) times daily., Disp: 60 tablet, Rfl: 3   azelastine (ASTELIN) 0.1 % nasal spray, Place 1 spray into both nostrils 2 (two) times daily. Use in each nostril as directed, Disp: 30 mL, Rfl: 5   benzonatate (TESSALON) 200 MG capsule, Take 1 capsule (200 mg total) by mouth 3 (three) times daily as needed., Disp: 45 capsule, Rfl: 1   cetirizine (ZYRTEC) 10 MG tablet, Take 10 mg by mouth daily., Disp: , Rfl:    chlorpheniramine (CHLOR-TRIMETON) 4 MG tablet, Take 4 mg by mouth daily., Disp: , Rfl:    dapagliflozin propanediol (FARXIGA) 10 MG TABS tablet, Take 1 tablet (10 mg total) by mouth daily before breakfast., Disp: 30 tablet, Rfl: 3   doxycycline (VIBRA-TABS) 100 MG tablet, Take 1 tablet (100 mg total) by mouth 2 (two) times daily. (Patient not taking: Reported on 06/01/2022), Disp: 14 tablet, Rfl: 0   EPINEPHrine 0.3 mg/0.3 mL IJ SOAJ injection, Inject 0.3 mg into the muscle as needed for anaphylaxis., Disp: , Rfl:    famotidine (PEPCID) 20 MG tablet, Take 20 mg by  mouth daily., Disp: , Rfl:    fexofenadine (ALLEGRA) 180 MG tablet, Take 180 mg by mouth daily. (Patient not taking: Reported on 06/01/2022), Disp: , Rfl:    fluticasone furoate-vilanterol (BREO ELLIPTA) 100-25 MCG/ACT AEPB, Inhale 1 puff into the lungs daily., Disp: 30 each, Rfl: 5   furosemide (LASIX) 20 MG tablet, Take 1 tablet (20 mg total) by mouth daily., Disp: 180 tablet, Rfl: 2   glucosamine-chondroitin 500-400 MG tablet, Take 1 tablet by mouth 2 (two) times daily. 1 pm, Disp: , Rfl:    KLOR-CON M20 20 MEQ tablet, TAKE 1 TABLET (20 MEQ) TWICE EVERY OTHER DAY ALTERNATING WITH 1 TABLET (20 MEQ) ONCE EVERY OTHER DAY, Disp: 135 tablet, Rfl: 1   Krill Oil 500 MG CAPS, Take by mouth., Disp: , Rfl:    Multiple Vitamin (MULTIVITAMIN) capsule, Take 1 capsule by mouth daily., Disp: , Rfl:    pravastatin (PRAVACHOL) 40 MG tablet, TAKE 1 TABLET BY MOUTH EVERYDAY AT BEDTIME (Patient taking differently: Once a day with Lasix), Disp: 90 tablet, Rfl: 3   prednisoLONE acetate (PRED FORTE) 1 % ophthalmic suspension, Apply to eye., Disp: , Rfl:    predniSONE (DELTASONE) 20 MG tablet, Take 1 tablet (20 mg total) by mouth daily with breakfast., Disp: 5 tablet, Rfl: 0   sertraline (ZOLOFT) 25 MG tablet, TAKE 1 TABLET BY MOUTH EVERY DAY, Disp: 100 tablet, Rfl: 3   sodium  chloride (MURO 128) 5 % ophthalmic solution, PLACE 1 DROP INTO RIGHT EYE 4 TIMES A DAY, Disp: , Rfl:    triamcinolone (NASACORT ALLERGY 24HR) 55 MCG/ACT AERO nasal inhaler, Place 2 sprays into the nose daily. (Patient not taking: Reported on 06/01/2022), Disp: , Rfl:    valsartan (DIOVAN) 160 MG tablet, Take 1 tablet (160 mg total) by mouth 2 (two) times daily., Disp: 180 tablet, Rfl: 1   vitamin B-12 (CYANOCOBALAMIN) 500 MCG tablet, Take 500 mcg by mouth daily., Disp: , Rfl:   Past Medical History: Past Medical History:  Diagnosis Date   Adenomatous colon polyp 2005   Allergy    perennial ; shots from Dr Corinda Gubler   Aortic insufficiency     Hyperlipidemia    Hypertension    Murmur    a.  04/2011 Echo:  EF 55-60%, mild-mod ai/mr   Squamous cell carcinoma of skin 08/05/2020   left pretibia EDC   Uterine cancer (HCC)     Tobacco Use: Social History   Tobacco Use  Smoking Status Never   Passive exposure: Past  Smokeless Tobacco Never    Labs: Review Flowsheet  More data exists      Latest Ref Rng & Units 05/31/2016 06/21/2017 08/14/2018 12/03/2020 12/19/2021  Labs for ITP Cardiac and Pulmonary Rehab  Cholestrol 0 - 200 mg/dL 161  096  045  409  811   LDL (calc) 0 - 99 mg/dL 91  77  71  60  77   HDL-C >39.00 mg/dL 91.47  82.95  62.13  08.65  50.20   Trlycerides 0.0 - 149.0 mg/dL 78.4  69.6  29.5  28.4  68.0   Hemoglobin A1c 4.6 - 6.5 % 5.8  5.8  5.8  - -     Pulmonary Assessment Scores:  Pulmonary Assessment Scores     Row Name 06/14/22 1444         ADL UCSD   ADL Phase Entry     SOB Score total 7     Rest 0     Walk 0     Stairs 1     Bath 0     Dress 0     Shop 0       CAT Score   CAT Score 5       mMRC Score   mMRC Score 2              UCSD: Self-administered rating of dyspnea associated with activities of daily living (ADLs) 6-point scale (0 = "not at all" to 5 = "maximal or unable to do because of breathlessness")  Scoring Scores range from 0 to 120.  Minimally important difference is 5 units  CAT: CAT can identify the health impairment of COPD patients and is better correlated with disease progression.  CAT has a scoring range of zero to 40. The CAT score is classified into four groups of low (less than 10), medium (10 - 20), high (21-30) and very high (31-40) based on the impact level of disease on health status. A CAT score over 10 suggests significant symptoms.  A worsening CAT score could be explained by an exacerbation, poor medication adherence, poor inhaler technique, or progression of COPD or comorbid conditions.  CAT MCID is 2 points  mMRC: mMRC (Modified Medical Research  Council) Dyspnea Scale is used to assess the degree of baseline functional disability in patients of respiratory disease due to dyspnea. No minimal important difference is established. A decrease in  score of 1 point or greater is considered a positive change.   Pulmonary Function Assessment:   Exercise Target Goals: Exercise Program Goal: Individual exercise prescription set using results from initial 6 min walk test and THRR while considering  patient's activity barriers and safety.   Exercise Prescription Goal: Initial exercise prescription builds to 30-45 minutes a day of aerobic activity, 2-3 days per week.  Home exercise guidelines will be given to patient during program as part of exercise prescription that the participant will acknowledge.  Education: Aerobic Exercise: - Group verbal and visual presentation on the components of exercise prescription. Introduces F.I.T.T principle from ACSM for exercise prescriptions.  Reviews F.I.T.T. principles of aerobic exercise including progression. Written material given at graduation.   Education: Resistance Exercise: - Group verbal and visual presentation on the components of exercise prescription. Introduces F.I.T.T principle from ACSM for exercise prescriptions  Reviews F.I.T.T. principles of resistance exercise including progression. Written material given at graduation.    Education: Exercise & Equipment Safety: - Individual verbal instruction and demonstration of equipment use and safety with use of the equipment. Flowsheet Row Pulmonary Rehab from 06/14/2022 in [facility] Cardiac and Pulmonary Rehab  Date 06/14/22  Educator [facility]  Instruction Review Code 1- Verbalizes Understanding       Education: Exercise Physiology & General Exercise Guidelines: - Group verbal and written instruction with models to review the exercise physiology of the cardiovascular system and associated critical values. Provides general exercise guidelines with specific  guidelines to those with heart or lung disease.    Education: Flexibility, Balance, Mind/Body Relaxation: - Group verbal and visual presentation with interactive activity on the components of exercise prescription. Introduces F.I.T.T principle from ACSM for exercise prescriptions. Reviews F.I.T.T. principles of flexibility and balance exercise training including progression. Also discusses the mind body connection.  Reviews various relaxation techniques to help reduce and manage stress (i.e. Deep breathing, progressive muscle relaxation, and visualization). Balance handout provided to take home. Written material given at graduation.   Activity Barriers & Risk Stratification:  Activity Barriers & Cardiac Risk Stratification - 06/14/22 1342       Activity Barriers & Cardiac Risk Stratification   Activity Barriers Back Problems             6 Minute Walk:  6 Minute Walk     Row Name 06/14/22 1321         6 Minute Walk   Phase Initial     Distance 1080 feet     Walk Time 6 minutes     # of Rest Breaks 0     MPH 2.05     METS 1.97     RPE 12     Perceived Dyspnea  0     VO2 Peak 6.88     Symptoms No     Resting HR 71 bpm     Resting BP 138/82     Resting Oxygen Saturation  96 %     Exercise Oxygen Saturation  during 6 min walk 94 %     Max Ex. HR 80 bpm     Max Ex. BP 160/62     2 Minute Post BP 142/72       Interval HR   1 Minute HR 77     2 Minute HR 78     3 Minute HR 78     4 Minute HR 79     5 Minute HR 79     6 Minute HR 80  2 Minute Post HR 69     Interval Heart Rate? Yes       Interval Oxygen   Interval Oxygen? Yes     Baseline Oxygen Saturation % 96 %     1 Minute Oxygen Saturation % 97 %     1 Minute Liters of Oxygen 0 L     2 Minute Oxygen Saturation % 94 %     2 Minute Liters of Oxygen 0 L     3 Minute Oxygen Saturation % 94 %     3 Minute Liters of Oxygen 0 L     4 Minute Oxygen Saturation % 95 %     4 Minute Liters of Oxygen 0 L     5  Minute Oxygen Saturation % 96 %     5 Minute Liters of Oxygen 0 L     6 Minute Oxygen Saturation % 97 %     6 Minute Liters of Oxygen 0 L     2 Minute Post Oxygen Saturation % 98 %     2 Minute Post Liters of Oxygen 0 L             Oxygen Initial Assessment:  Oxygen Initial Assessment - 06/14/22 1444       Home Oxygen   Home Oxygen Device None    Sleep Oxygen Prescription Continuous    Liters per minute 2    Home Exercise Oxygen Prescription None    Home Resting Oxygen Prescription None      Initial 6 min Walk   Oxygen Used None      Program Oxygen Prescription   Program Oxygen Prescription None      Intervention   Short Term Goals To learn and understand importance of monitoring SPO2 with pulse oximeter and demonstrate accurate use of the pulse oximeter.;To learn and understand importance of maintaining oxygen saturations>88%;To learn and demonstrate proper pursed lip breathing techniques or other breathing techniques. ;To learn and demonstrate proper use of respiratory medications    Long  Term Goals Verbalizes importance of monitoring SPO2 with pulse oximeter and return demonstration;Maintenance of O2 saturations>88%;Exhibits proper breathing techniques, such as pursed lip breathing or other method taught during program session;Compliance with respiratory medication;Demonstrates proper use of MDI's             Oxygen Re-Evaluation:  Oxygen Re-Evaluation     Row Name 06/20/22 1003 07/04/22 1358           Program Oxygen Prescription   Program Oxygen Prescription -- None        Home Oxygen   Home Oxygen Device -- None      Sleep Oxygen Prescription -- Continuous      Liters per minute -- 2      Home Exercise Oxygen Prescription -- None      Home Resting Oxygen Prescription -- None        Goals/Expected Outcomes   Short Term Goals -- To learn and understand importance of monitoring SPO2 with pulse oximeter and demonstrate accurate use of the pulse oximeter.;To  learn and understand importance of maintaining oxygen saturations>88%;To learn and demonstrate proper pursed lip breathing techniques or other breathing techniques. ;To learn and demonstrate proper use of respiratory medications      Long  Term Goals -- Verbalizes importance of monitoring SPO2 with pulse oximeter and return demonstration;Maintenance of O2 saturations>88%;Exhibits proper breathing techniques, such as pursed lip breathing or other method taught during program session;Compliance with respiratory medication;Demonstrates  proper use of MDI's      Comments Reviewed PLB technique with pt.  Talked about how it works and it's importance in maintaining their exercise saturations. Genesi states she is compliant with using her sleep oxygen. She does have a pulse ox to monitor her HR and O2 levels and is aware to make sure her oxygen levels stays above 88%. We talked more about PLB today and she is going to practice it more. She experiences a lot of sinus issues which impacts her SOB at times, but continues to take medications for it to help relieve some symptoms.      Goals/Expected Outcomes Short: Become more profiecient at using PLB. Long: Become independent at using PLB. Short: Continue to monitor O2 saturations closely, notify of any concerns Long: Continue to become proficient at using PLB and monitoring oxygen               Oxygen Discharge (Final Oxygen Re-Evaluation):  Oxygen Re-Evaluation - 07/04/22 1358       Program Oxygen Prescription   Program Oxygen Prescription None      Home Oxygen   Home Oxygen Device None    Sleep Oxygen Prescription Continuous    Liters per minute 2    Home Exercise Oxygen Prescription None    Home Resting Oxygen Prescription None      Goals/Expected Outcomes   Short Term Goals To learn and understand importance of monitoring SPO2 with pulse oximeter and demonstrate accurate use of the pulse oximeter.;To learn and understand importance of  maintaining oxygen saturations>88%;To learn and demonstrate proper pursed lip breathing techniques or other breathing techniques. ;To learn and demonstrate proper use of respiratory medications    Long  Term Goals Verbalizes importance of monitoring SPO2 with pulse oximeter and return demonstration;Maintenance of O2 saturations>88%;Exhibits proper breathing techniques, such as pursed lip breathing or other method taught during program session;Compliance with respiratory medication;Demonstrates proper use of MDI's    Comments Skky states she is compliant with using her sleep oxygen. She does have a pulse ox to monitor her HR and O2 levels and is aware to make sure her oxygen levels stays above 88%. We talked more about PLB today and she is going to practice it more. She experiences a lot of sinus issues which impacts her SOB at times, but continues to take medications for it to help relieve some symptoms.    Goals/Expected Outcomes Short: Continue to monitor O2 saturations closely, notify of any concerns Long: Continue to become proficient at using PLB and monitoring oxygen             Initial Exercise Prescription:  Initial Exercise Prescription - 06/14/22 1300       Date of Initial Exercise RX and Referring Provider   Date 06/14/22    Referring Provider Bensimhon      Oxygen   Maintain Oxygen Saturation 88% or higher      Recumbant Bike   Level 2    RPM 50    Watts 15    Minutes 15      NuStep   Level 2    SPM 80    Minutes 15    METs 1.97      REL-XR   Level 2    Speed 50    Minutes 15    METs 1.97      Track   Laps 30    Minutes 15    METs 2.63      Prescription Details  Frequency (times per week) 2    Duration Progress to 30 minutes of continuous aerobic without signs/symptoms of physical distress      Intensity   THRR 40-80% of Max Heartrate 97-124    Ratings of Perceived Exertion 11-13    Perceived Dyspnea 0-4      Progression   Progression Continue  to progress workloads to maintain intensity without signs/symptoms of physical distress.      Resistance Training   Training Prescription Yes    Weight 2    Reps 10-15             Perform Capillary Blood Glucose checks as needed.  Exercise Prescription Changes:   Exercise Prescription Changes     Row Name 06/14/22 1300 07/03/22 1200 07/04/22 1100         Response to Exercise   Blood Pressure (Admit) 138/82 114/58 --     Blood Pressure (Exercise) 160/62 126/58 --     Blood Pressure (Exit) 142/72 118/60 --     Heart Rate (Admit) 71 bpm 65 bpm --     Heart Rate (Exercise) 80 bpm 98 bpm --     Heart Rate (Exit) 69 bpm 58 bpm --     Oxygen Saturation (Admit) 96 % 97 % --     Oxygen Saturation (Exercise) 94 % 91 % --     Oxygen Saturation (Exit) 98 % 94 % --     Rating of Perceived Exertion (Exercise) 12 13 --     Perceived Dyspnea (Exercise) 0 2 --     Symptoms none SOB --     Comments 6 MWT results 2nd full week of exercise --     Duration -- Progress to 30 minutes of  aerobic without signs/symptoms of physical distress --     Intensity -- THRR unchanged --       Progression   Progression -- Continue to progress workloads to maintain intensity without signs/symptoms of physical distress. --     Average METs -- 2.56 --       Resistance Training   Training Prescription -- Yes --     Weight -- 2 lb --     Reps -- 10-15 --       Interval Training   Interval Training -- No --       NuStep   Level -- 1 --     Minutes -- 15 --     METs -- 2.1 --       REL-XR   Level -- 1 --     Minutes -- 15 --     METs -- 3.3 --       Track   Laps -- 27 --     Minutes -- 15 --     METs -- 2.47 --       Home Exercise Plan   Plans to continue exercise at -- -- Home (comment)  walking     Frequency -- -- Add 2 additional days to program exercise sessions.     Initial Home Exercises Provided -- -- 07/04/22       Oxygen   Maintain Oxygen Saturation -- 88% or higher 88% or  higher              Exercise Comments:   Exercise Comments     Row Name 06/20/22 1002           Exercise Comments First full day of exercise!  Patient was oriented to  gym and equipment including functions, settings, policies, and procedures.  Patient's individual exercise prescription and treatment plan were reviewed.  All starting workloads were established based on the results of the 6 minute walk test done at initial orientation visit.  The plan for exercise progression was also introduced and progression will be customized based on patient's performance and goals.                Exercise Goals and Review:   Exercise Goals     Row Name 06/14/22 1346             Exercise Goals   Increase Physical Activity Yes       Intervention Provide advice, education, support and counseling about physical activity/exercise needs.;Develop an individualized exercise prescription for aerobic and resistive training based on initial evaluation findings, risk stratification, comorbidities and participant's personal goals.       Expected Outcomes Short Term: Attend rehab on a regular basis to increase amount of physical activity.;Long Term: Add in home exercise to make exercise part of routine and to increase amount of physical activity.;Long Term: Exercising regularly at least 3-5 days a week.       Increase Strength and Stamina Yes       Intervention Provide advice, education, support and counseling about physical activity/exercise needs.;Develop an individualized exercise prescription for aerobic and resistive training based on initial evaluation findings, risk stratification, comorbidities and participant's personal goals.       Expected Outcomes Short Term: Increase workloads from initial exercise prescription for resistance, speed, and METs.;Short Term: Perform resistance training exercises routinely during rehab and add in resistance training at home;Long Term: Improve cardiorespiratory  fitness, muscular endurance and strength as measured by increased METs and functional capacity ( )       Able to understand and use rate of perceived exertion (RPE) scale Yes       Intervention Provide education and explanation on how to use RPE scale       Expected Outcomes Short Term: Able to use RPE daily in rehab to express subjective intensity level;Long Term:  Able to use RPE to guide intensity level when exercising independently       Able to understand and use Dyspnea scale Yes       Intervention Provide education and explanation on how to use Dyspnea scale       Expected Outcomes Short Term: Able to use Dyspnea scale daily in rehab to express subjective sense of shortness of breath during exertion;Long Term: Able to use Dyspnea scale to guide intensity level when exercising independently       Knowledge and understanding of Target Heart Rate Range (THRR) Yes       Intervention Provide education and explanation of THRR including how the numbers were predicted and where they are located for reference       Expected Outcomes Short Term: Able to state/look up THRR;Long Term: Able to use THRR to govern intensity when exercising independently;Short Term: Able to use daily as guideline for intensity in rehab       Able to check pulse independently Yes       Intervention Provide education and demonstration on how to check pulse in carotid and radial arteries.;Review the importance of being able to check your own pulse for safety during independent exercise       Expected Outcomes Short Term: Able to explain why pulse checking is important during independent exercise;Long Term: Able to check pulse independently and accurately  Understanding of Exercise Prescription Yes       Intervention Provide education, explanation, and written materials on patient's individual exercise prescription       Expected Outcomes Short Term: Able to explain program exercise prescription;Long Term: Able to explain  home exercise prescription to exercise independently                Exercise Goals Re-Evaluation :  Exercise Goals Re-Evaluation     Row Name 06/20/22 1002 07/03/22 1236 07/04/22 1147         Exercise Goal Re-Evaluation   Exercise Goals Review Able to understand and use rate of perceived exertion (RPE) scale;Able to understand and use Dyspnea scale;Knowledge and understanding of Target Heart Rate Range (THRR);Understanding of Exercise Prescription Increase Physical Activity;Increase Strength and Stamina;Understanding of Exercise Prescription Increase Physical Activity;Increase Strength and Stamina;Understanding of Exercise Prescription     Comments Reviewed RPE scale, THR and program prescription with pt today.  Pt voiced understanding and was given a copy of goals to take home. Kimberlyann has done well for her first couple of weeks she has been at rehab. She has been able to reach 27 laps on the track- her goal is 30 and we hope to see her reach that! She also has exercised at level 1 on the both the XR and T4 Nustep. Her oxygen saturations are staying above 88% and RPEs are appropriate. Will continue to monitor. Reviewed home exercise with pt today.  Pt plans to walk for exercise at this time.  Yer was part of the [facility] back in 2019 and is thinking about re-joining again. Reviewed THR, pulse, RPE, sign and symptoms, pulse oximetery and when to call 911 or MD.  Also discussed weather considerations and indoor options.  Pt voiced understanding.     Expected Outcomes Short: Use RPE daily to regulate intensity. Long: Follow program prescription in THR. Short: Increase laps to 30 Long: Increase overall MET level and stamina Short: monitor HR and O2 closely when exercising Long: Continue to exercise routinely independently at home              Discharge Exercise Prescription (Final Exercise Prescription Changes):  Exercise Prescription Changes - 07/04/22 1100       Home Exercise Plan    Plans to continue exercise at Home (comment)   walking   Frequency Add 2 additional days to program exercise sessions.    Initial Home Exercises Provided 07/04/22      Oxygen   Maintain Oxygen Saturation 88% or higher             Nutrition:  Target Goals: Understanding of nutrition guidelines, daily intake of sodium 1500mg , cholesterol 200mg , calories 30% from fat and 7% or less from saturated fats, daily to have 5 or more servings of fruits and vegetables.  Education: All About Nutrition: -Group instruction provided by verbal, written material, interactive activities, discussions, models, and posters to present general guidelines for heart healthy nutrition including fat, fiber, MyPlate, the role of sodium in heart healthy nutrition, utilization of the nutrition label, and utilization of this knowledge for meal planning. Follow up email sent as well. Written material given at graduation. Flowsheet Row Pulmonary Rehab from [facility]/14/2022 in [facility] Cardiac and Pulmonary Rehab  Education need identified 06/14/22       Biometrics:  Pre Biometrics - 06/14/22 1346       Pre Biometrics   Height 5\' 3"  (1.6 m)    Weight 126 lb 11.2 oz (57.5 kg)  Waist Circumference 32.5 inches    Hip Circumference 37 inches    Waist to Hip Ratio 0.88 %    BMI (Calculated) 22.45    Single Leg Stand 6.81 seconds              Nutrition Therapy Plan and Nutrition Goals:  Nutrition Therapy & Goals - 06/14/22 1442       Intervention Plan   Intervention Prescribe, educate and counsel regarding individualized specific dietary modifications aiming towards targeted core components such as weight, hypertension, lipid management, diabetes, heart failure and other comorbidities.    Expected Outcomes Short Term Goal: Understand basic principles of dietary content, such as calories, fat, sodium, cholesterol and nutrients.;Short Term Goal: A plan has been developed with personal nutrition goals set during  dietitian appointment.;Long Term Goal: Adherence to prescribed nutrition plan.             Nutrition Assessments:  MEDIFICTS Score Key: ?70 Need to make dietary changes  40-70 Heart Healthy Diet ? 40 Therapeutic Level Cholesterol Diet  Flowsheet Row Pulmonary Rehab from 06/14/2022 in [facility] Cardiac and Pulmonary Rehab  Picture Your Plate Total Score on Admission 68      Picture Your Plate Scores: <82 Unhealthy dietary pattern with much room for improvement. 41-50 Dietary pattern unlikely to meet recommendations for good health and room for improvement. 51-60 More healthful dietary pattern, with some room for improvement.  >60 Healthy dietary pattern, although there may be some specific behaviors that could be improved.   Nutrition Goals Re-Evaluation:  Nutrition Goals Re-Evaluation     Row Name 07/04/22 1031             Goals   Nutrition Goal Patient is interested in meeting with the RD, we are currently in transition in hiring a new dietician and once they are on board, will meet with the patient. Patient would like to focus more on cooking at home and specifically for herself only.       Comment Aaliayh states she generally follows a "diabetic friendly diet" because her and her husband managed it together for him until he passed. She already focuses on smaller frequent meals. She admits she does go out and get food a lot which could consist of salads with chicken, bean salads. We talked about being mindful about sodium as restaurant food can have a higher sodium content. Now that she is living by herself, she struggles with cooking for herself only. She does freeze her leftovers to help not waste food. I did provide her a couple handouts on healthy budget eating and tips to review. She is interested in meeting with the RD, in which we are in transition with at this time.       Expected Outcome Short: Review paperwork Long: Continue to eat healthy pulmonary based diet                 Nutrition Goals Discharge (Final Nutrition Goals Re-Evaluation):  Nutrition Goals Re-Evaluation - 07/04/22 1031       Goals   Nutrition Goal Patient is interested in meeting with the RD, we are currently in transition in hiring a new dietician and once they are on board, will meet with the patient. Patient would like to focus more on cooking at home and specifically for herself only.    Comment Kaliah states she generally follows a "diabetic friendly diet" because her and her husband managed it together for him until he passed. She already focuses on smaller  frequent meals. She admits she does go out and get food a lot which could consist of salads with chicken, bean salads. We talked about being mindful about sodium as restaurant food can have a higher sodium content. Now that she is living by herself, she struggles with cooking for herself only. She does freeze her leftovers to help not waste food. I did provide her a couple handouts on healthy budget eating and tips to review. She is interested in meeting with the RD, in which we are in transition with at this time.    Expected Outcome Short: Review paperwork Long: Continue to eat healthy pulmonary based diet             Psychosocial: Target Goals: Acknowledge presence or absence of significant depression and/or stress, maximize coping skills, provide positive support system. Participant is able to verbalize types and ability to use techniques and skills needed for reducing stress and depression.   Education: Stress, Anxiety, and Depression - Group verbal and visual presentation to define topics covered.  Reviews how body is impacted by stress, anxiety, and depression.  Also discusses healthy ways to reduce stress and to treat/manage anxiety and depression.  Written material given at graduation.   Education: Sleep Hygiene -Provides group verbal and written instruction about how sleep can affect your health.  Define sleep  hygiene, discuss sleep cycles and impact of sleep habits. Review good sleep hygiene tips.    Initial Review & Psychosocial Screening:  Initial Psych Review & Screening - 06/07/22 1009       Initial Review   Current issues with Current Stress Concerns      Family Dynamics   Good Support System? Yes   2 daughters, neighbor     Barriers   Psychosocial barriers to participate in program There are no identifiable barriers or psychosocial needs.      Screening Interventions   Interventions Encouraged to exercise;To provide support and resources with identified psychosocial needs;Provide feedback about the scores to participant    Expected Outcomes Short Term goal: Utilizing psychosocial counselor, staff and physician to assist with identification of specific Stressors or current issues interfering with healing process. Setting desired goal for each stressor or current issue identified.;Long Term Goal: Stressors or current issues are controlled or eliminated.;Short Term goal: Identification and review with participant of any Quality of Life or Depression concerns found by scoring the questionnaire.;Long Term goal: The participant improves quality of Life and PHQ9 Scores as seen by post scores and/or verbalization of changes             Quality of Life Scores:  Scores of 19 and below usually indicate a poorer quality of life in these areas.  A difference of  2-3 points is a clinically meaningful difference.  A difference of 2-3 points in the total score of the Quality of Life Index has been associated with significant improvement in overall quality of life, self-image, physical symptoms, and general health in studies assessing change in quality of life.  PHQ-9: Review Flowsheet  More data exists      06/14/2022 12/19/2021 05/31/2016 05/28/2015 05/20/2014  Depression screen PHQ 2/9  Decreased Interest 0 0 0 0 0 0 0  Down, Depressed, Hopeless 0 0 0 0 0 0 0  PHQ - 2 Score 0 0 0 0 0 0 0   Altered sleeping 0 - - - -  Tired, decreased energy 1 - - - -  Change in appetite 1 - - - -  Feeling bad or failure about yourself  0 - - - -  Trouble concentrating 0 - - - -  Moving slowly or fidgety/restless 1 - - - -  Suicidal thoughts 0 - - - -  PHQ-9 Score 3 - - - -  Difficult doing work/chores Not difficult at all - - - -   Interpretation of Total Score  Total Score Depression Severity:  1-4 = Minimal depression, 5-9 = Mild depression, 10-14 = Moderate depression, 15-19 = Moderately severe depression, 20-27 = Severe depression   Psychosocial Evaluation and Intervention:  Psychosocial Evaluation - 06/07/22 1036       Psychosocial Evaluation & Interventions   Interventions Encouraged to exercise with the program and follow exercise prescription    Comments Deb is coming to pulmonary rehab with the diagnosis of pulmonary HTN. She has been managing her heart failure symptoms well. She has noticed her breathing has gotten better with fluid managment. She does report her biggest stressor coming to the program is "white coat syndrome," staff reassured her that we will take it day by day and keep that in mind with her blood pressure readings. She has a very supportive family and neighbor who are there if she needs anything. She lives alone and does most of her own care and housekeeping.    Expected Outcomes Short: attend pulmonary rehab for education and exercise. Long: develop and maintain positive self care habits.    Continue Psychosocial Services  Follow up required by staff             Psychosocial Re-Evaluation:  Psychosocial Re-Evaluation     Row Name 07/04/22 1052             Psychosocial Re-Evaluation   Current issues with Current Sleep Concerns       Comments Halynn is doing well mentally. She does have issues with sleep as she experiences bladder issues that causes her to get up multiple times during the night to use the bathroom. Her and her doctor talked  about it and they are not pursing anything at this time. She knows to speak up if it becomes a bigger issue. She stays compliant using her oxygen at night. Her doctor encouraged her to take naps during the day if that helps. Patient takes zoloft which helps. She has good support from her 2 of her daughters. She has been enjoying the program thus far and denies other concerns at this time.       Expected Outcomes Short: Continue rehab attendance for mood boost Long: Continue to maintain a positive attitude       Interventions Encouraged to attend Pulmonary Rehabilitation for the exercise       Continue Psychosocial Services  Follow up required by staff                Psychosocial Discharge (Final Psychosocial Re-Evaluation):  Psychosocial Re-Evaluation - 07/04/22 1052       Psychosocial Re-Evaluation   Current issues with Current Sleep Concerns    Comments Airlie is doing well mentally. She does have issues with sleep as she experiences bladder issues that causes her to get up multiple times during the night to use the bathroom. Her and her doctor talked about it and they are not pursing anything at this time. She knows to speak up if it becomes a bigger issue. She stays compliant using her oxygen at night. Her doctor encouraged her to take naps during the day if that helps. Patient takes  zoloft which helps. She has good support from her 2 of her daughters. She has been enjoying the program thus far and denies other concerns at this time.    Expected Outcomes Short: Continue rehab attendance for mood boost Long: Continue to maintain a positive attitude    Interventions Encouraged to attend Pulmonary Rehabilitation for the exercise    Continue Psychosocial Services  Follow up required by staff             Education: Education Goals: Education classes will be provided on a weekly basis, covering required topics. Participant will state understanding/return demonstration of topics  presented.  Learning Barriers/Preferences:  Learning Barriers/Preferences - 06/07/22 1006       Learning Barriers/Preferences   Learning Barriers Sight    Learning Preferences None             General Pulmonary Education Topics:  Infection Prevention: - Provides verbal and written material to individual with discussion of infection control including proper hand washing and proper equipment cleaning during exercise session. Flowsheet Row Pulmonary Rehab from 06/14/2022 in [facility] Cardiac and Pulmonary Rehab  Date 06/14/22  Educator [facility]  Instruction Review Code 1- Verbalizes Understanding       Falls Prevention: - Provides verbal and written material to individual with discussion of falls prevention and safety. Flowsheet Row Pulmonary Rehab from 06/14/2022 in [facility] Cardiac and Pulmonary Rehab  Date 06/14/22  Educator [facility]  Instruction Review Code 1- Verbalizes Understanding       Chronic Lung Disease Review: - Group verbal instruction with posters, models, PowerPoint presentations and videos,  to review new updates, new respiratory medications, new advancements in procedures and treatments. Providing information on websites and "800" numbers for continued self-education. Includes information about supplement oxygen, available portable oxygen systems, continuous and intermittent flow rates, oxygen safety, concentrators, and Medicare reimbursement for oxygen. Explanation of Pulmonary Drugs, including class, frequency, complications, importance of spacers, rinsing mouth after steroid MDI's, and proper cleaning methods for nebulizers. Review of basic lung anatomy and physiology related to function, structure, and complications of lung disease. Review of risk factors. Discussion about methods for diagnosing sleep apnea and types of masks and machines for OSA. Includes a review of the use of types of environmental controls: home humidity, furnaces, filters, dust mite/pet prevention, HEPA  vacuums. Discussion about weather changes, air quality and the benefits of nasal washing. Instruction on Warning signs, infection symptoms, calling MD promptly, preventive modes, and value of vaccinations. Review of effective airway clearance, coughing and/or vibration techniques. Emphasizing that all should Create an Action Plan. Written material given at graduation. Flowsheet Row Pulmonary Rehab from 06/14/2022 in [facility] Cardiac and Pulmonary Rehab  Education need identified 06/14/22       AED/CPR: - Group verbal and written instruction with the use of models to demonstrate the basic use of the AED with the basic ABC's of resuscitation.    Anatomy and Cardiac Procedures: - Group verbal and visual presentation and models provide information about basic cardiac anatomy and function. Reviews the testing methods done to diagnose heart disease and the outcomes of the test results. Describes the treatment choices: Medical Management, Angioplasty, or Coronary Bypass Surgery for treating various heart conditions including Myocardial Infarction, Angina, Valve Disease, and Cardiac Arrhythmias.  Written material given at graduation.   Medication Safety: - Group verbal and visual instruction to review commonly prescribed medications for heart and lung disease. Reviews the medication, class of the drug, and side effects. Includes the steps to properly store  meds and maintain the prescription regimen.  Written material given at graduation.   Other: -Provides group and verbal instruction on various topics (see comments)   Knowledge Questionnaire Score:  Knowledge Questionnaire Score - 06/14/22 1440       Knowledge Questionnaire Score   Pre Score 8/18              Core Components/Risk Factors/Patient Goals at Admission:  Personal Goals and Risk Factors at Admission - 06/14/22 1442       Core Components/Risk Factors/Patient Goals on Admission    Weight Management Yes    Intervention Weight  Management: Develop a combined nutrition and exercise program designed to reach desired caloric intake, while maintaining appropriate intake of nutrient and fiber, sodium and fats, and appropriate energy expenditure required for the weight goal.;Weight Management: Provide education and appropriate resources to help participant work on and attain dietary goals.    Admit Weight 126 lb 11.2 oz (57.5 kg)    Goal Weight: Short Term 127 lb (57.6 kg)    Goal Weight: Long Term 127 lb (57.6 kg)    Expected Outcomes Short Term: Continue to assess and modify interventions until short term weight is achieved;Long Term: Adherence to nutrition and physical activity/exercise program aimed toward attainment of established weight goal;Weight Maintenance: Understanding of the daily nutrition guidelines, which includes 25-35% calories from fat, 7% or less cal from saturated fats, less than 200mg  cholesterol, less than 1.5gm of sodium, & 5 or more servings of fruits and vegetables daily;Understanding recommendations for meals to include 15-35% energy as protein, 25-35% energy from fat, 35-60% energy from carbohydrates, less than 200mg  of dietary cholesterol, 20-35 gm of total fiber daily;Understanding of distribution of calorie intake throughout the day with the consumption of 4-5 meals/snacks    Heart Failure Yes    Intervention Provide a combined exercise and nutrition program that is supplemented with education, support and counseling about heart failure. Directed toward relieving symptoms such as shortness of breath, decreased exercise tolerance, and extremity edema.    Expected Outcomes Improve functional capacity of life;Short term: Attendance in program 2-3 days a week with increased exercise capacity. Reported lower sodium intake. Reported increased fruit and vegetable intake. Reports medication compliance.;Short term: Daily weights obtained and reported for increase. Utilizing diuretic protocols set by physician.;Long  term: Adoption of self-care skills and reduction of barriers for early signs and symptoms recognition and intervention leading to self-care maintenance.    Hypertension Yes    Intervention Provide education on lifestyle modifcations including regular physical activity/exercise, weight management, moderate sodium restriction and increased consumption of fresh fruit, vegetables, and low fat dairy, alcohol moderation, and smoking cessation.;Monitor prescription use compliance.    Expected Outcomes Short Term: Continued assessment and intervention until BP is < 140/43mm HG in hypertensive participants. < 130/73mm HG in hypertensive participants with diabetes, heart failure or chronic kidney disease.;Long Term: Maintenance of blood pressure at goal levels.    Lipids Yes    Intervention Provide education and support for participant on nutrition & aerobic/resistive exercise along with prescribed medications to achieve LDL 70mg , HDL >40mg .    Expected Outcomes Short Term: Participant states understanding of desired cholesterol values and is compliant with medications prescribed. Participant is following exercise prescription and nutrition guidelines.;Long Term: Cholesterol controlled with medications as prescribed, with individualized exercise RX and with personalized nutrition plan. Value goals: LDL < 70mg , HDL > 40 mg.             Education:Diabetes - Individual verbal  and written instruction to review signs/symptoms of diabetes, desired ranges of glucose level fasting, after meals and with exercise. Acknowledge that pre and post exercise glucose checks will be done for 3 sessions at entry of program.   Know Your Numbers and Heart Failure: - Group verbal and visual instruction to discuss disease risk factors for cardiac and pulmonary disease and treatment options.  Reviews associated critical values for Overweight/Obesity, Hypertension, Cholesterol, and Diabetes.  Discusses basics of heart failure:  signs/symptoms and treatments.  Introduces Heart Failure Zone chart for action plan for heart failure.  Written material given at graduation.   Core Components/Risk Factors/Patient Goals Review:   Goals and Risk Factor Review     Row Name 07/04/22 1030 07/04/22 1031           Core Components/Risk Factors/Patient Goals Review   Personal Goals Review Hypertension;Heart Failure;Weight Management/Obesity --      Review Keora is checking her BP at home and keeps a log of it at home. She usually runs 120-130s/60-70s. She is stable at rehab as well. She also watches her weight at home and is looking to maintain at this time, she usually ranges between 122-124 lb.  she is aware to be mindful of any sudden weight gain that could be potentially causes from fluid. She denies any heart failure symotoms. She is staying compliant taking all of her medications. --      Expected Outcomes Short: Continue to watch weight, look for any abnormal changes Long: Continue to manage lifestyle risk factors --               Core Components/Risk Factors/Patient Goals at Discharge (Final Review):   Goals and Risk Factor Review - 07/04/22 1031       Core Components/Risk Factors/Patient Goals Review   Personal Goals Review --             ITP Comments:  ITP Comments     Row Name 06/07/22 1048 06/14/22 1321 06/20/22 1001 07/12/22 0942     ITP Comments Initial phone call completed. Diagnosis can be found in [facility] 4/3. EP Orientation scheduled for Wednesday 4/17 at 10:30. Completed and gym orientation. Initial ITP created and sent for review to Dr. Jinny Sanders, Medical Director. First full day of exercise!  Patient was oriented to gym and equipment including functions, settings, policies, and procedures.  Patient's individual exercise prescription and treatment plan were reviewed.  All starting workloads were established based on the results of the 6 minute walk test done at initial orientation visit.   The plan for exercise progression was also introduced and progression will be customized based on patient's performance and goals. 30 Day review completed. Medical Director ITP review done, changes made as directed, and signed approval by Medical Director.    new to program             Comments:

## 2022-07-13 ENCOUNTER — Encounter: Payer: Medicare Other | Admitting: *Deleted

## 2022-07-13 DIAGNOSIS — I272 Pulmonary hypertension, unspecified: Secondary | ICD-10-CM

## 2022-07-13 NOTE — Progress Notes (Signed)
Daily Session Note  Patient Details  Name: Tierney Wolden MRN: [id number] Date of Birth: 04/27/1940 Referring Provider:   Flowsheet Row Pulmonary Rehab from 06/14/2022 in [facility] Cardiac and Pulmonary Rehab  Referring Provider Bensimhon       Encounter Date: 07/13/2022  Check In:  Session Check In - 07/13/22 1030       Check-In   Supervising physician immediately available to respond to emergencies See telemetry face sheet for immediately available ER MD    Location [facility]-Cardiac & Pulmonary Rehab    Staff Present Lanny Hurst, RN, ADN;Jessica Juanetta Gosling, MA, RCEP, CCRP, Zackery Barefoot, MS, ACSM CEP, Exercise Physiologist    Virtual Visit No    Medication changes reported     No    Fall or balance concerns reported    No    Warm-up and Cool-down Performed on first and last piece of equipment    Resistance Training Performed Yes    VAD Patient? No    PAD/SET Patient? No      Pain Assessment   Currently in Pain? No/denies                Social History   Tobacco Use  Smoking Status Never   Passive exposure: Past  Smokeless Tobacco Never    Goals Met:  Independence with exercise equipment Exercise tolerated well No report of concerns or symptoms today Strength training completed today  Goals Unmet:  Not Applicable  Comments: Pt able to follow exercise prescription today without complaint.  Will continue to monitor for progression.    Dr. Bethann Punches is Medical Director for [facility] Cardiac Rehabilitation.  Dr. Vida Rigger is Medical Director for [facility] Pulmonary Rehabilitation.

## 2022-07-18 ENCOUNTER — Encounter: Payer: Medicare Other | Admitting: *Deleted

## 2022-07-18 DIAGNOSIS — I272 Pulmonary hypertension, unspecified: Secondary | ICD-10-CM | POA: Diagnosis not present

## 2022-07-18 NOTE — Progress Notes (Signed)
Daily Session Note  Patient Details  Name: [name] MRN: [id number] Date of Birth: 10-01-1940 Referring Provider:   Flowsheet Row Pulmonary Rehab from 06/14/2022 in [facility] Cardiac and Pulmonary Rehab  Referring Provider Bensimhon       Encounter Date: 07/18/2022  Check In:  Session Check In - 07/18/22 1100       Check-In   Supervising physician immediately available to respond to emergencies See telemetry face sheet for immediately available ER MD    Location [facility]-Cardiac & Pulmonary Rehab    Staff Present Cora Collum, RN, BSN, CCRP;Jessica Kilgore, MA, RCEP, CCRP, Zackery Barefoot, MS, ACSM CEP, Exercise Physiologist;Noah Tickle, BS, Exercise Physiologist    Virtual Visit No    Medication changes reported     No    Fall or balance concerns reported    No    Warm-up and Cool-down Performed on first and last piece of equipment    Resistance Training Performed Yes    VAD Patient? No    PAD/SET Patient? No      Pain Assessment   Currently in Pain? No/denies                Social History   Tobacco Use  Smoking Status Never   Passive exposure: Past  Smokeless Tobacco Never    Goals Met:  Proper associated with RPD/PD & O2 Sat Independence with exercise equipment Exercise tolerated well No report of concerns or symptoms today  Goals Unmet:  Not Applicable  Comments: Pt able to follow exercise prescription today without complaint.  Will continue to monitor for progression.    Dr. Bethann Punches is Medical Director for [facility] Cardiac Rehabilitation.  Dr. Vida Rigger is Medical Director for [facility] Pulmonary Rehabilitation.

## 2022-07-20 ENCOUNTER — Encounter: Payer: Medicare Other | Admitting: *Deleted

## 2022-07-20 DIAGNOSIS — I272 Pulmonary hypertension, unspecified: Secondary | ICD-10-CM | POA: Diagnosis not present

## 2022-07-20 NOTE — Progress Notes (Signed)
Daily Session Note  Patient Details  Name: [name] MRN: [id number] Date of Birth: 11-26-1940 Referring Provider:   Flowsheet Row Pulmonary Rehab from 06/14/2022 in [facility] Cardiac and Pulmonary Rehab  Referring Provider Bensimhon       Encounter Date: 07/20/2022  Check In:  Session Check In - 07/20/22 0958       Check-In   Supervising physician immediately available to respond to emergencies See telemetry face sheet for immediately available ER MD    Location [facility]-Cardiac & Pulmonary Rehab    Staff Present Lanny Hurst, RN, ADN;Jessica Juanetta Gosling, MA, RCEP, CCRP, CCET;Joseph Pioneer, RCP,RRT,BSRT;Other   [place], [place]   Virtual Visit No    Medication changes reported     No    Fall or balance concerns reported    No    Warm-up and Cool-down Performed on first and last piece of equipment    Resistance Training Performed Yes    VAD Patient? No    PAD/SET Patient? No      Pain Assessment   Currently in Pain? No/denies                Social History   Tobacco Use  Smoking Status Never   Passive exposure: Past  Smokeless Tobacco Never    Goals Met:  Independence with exercise equipment Exercise tolerated well No report of concerns or symptoms today Strength training completed today  Goals Unmet:  Not Applicable  Comments: Pt able to follow exercise prescription today without complaint.  Will continue to monitor for progression.    Dr. Bethann Punches is Medical Director for [facility] Cardiac Rehabilitation.  Dr. Vida Rigger is Medical Director for [facility] Pulmonary Rehabilitation.

## 2022-07-25 ENCOUNTER — Encounter: Payer: Medicare Other | Admitting: *Deleted

## 2022-07-25 DIAGNOSIS — I272 Pulmonary hypertension, unspecified: Secondary | ICD-10-CM

## 2022-07-25 NOTE — Progress Notes (Signed)
Daily Session Note  Patient Details  Name: [name] MRN: [id number] Date of Birth: 1940/07/13 Referring Provider:   Flowsheet Row Pulmonary Rehab from 06/14/2022 in [facility] Cardiac and Pulmonary Rehab  Referring Provider Bensimhon       Encounter Date: 07/25/2022  Check In:  Session Check In - 07/25/22 1026       Check-In   Supervising physician immediately available to respond to emergencies See telemetry face sheet for immediately available ER MD    Location [facility]-Cardiac & Pulmonary Rehab    Staff Present Cora Collum, RN, BSN, CCRP;Laureen Manson Passey, BS, RRT, CPFT;Noah Tickle, BS, Exercise Physiologist    Virtual Visit No    Medication changes reported     No    Fall or balance concerns reported    No    Warm-up and Cool-down Performed on first and last piece of equipment    Resistance Training Performed Yes    VAD Patient? No    PAD/SET Patient? No      Pain Assessment   Currently in Pain? No/denies                Social History   Tobacco Use  Smoking Status Never   Passive exposure: Past  Smokeless Tobacco Never    Goals Met:  Proper associated with RPD/PD & O2 Sat Independence with exercise equipment Exercise tolerated well No report of concerns or symptoms today  Goals Unmet:  Not Applicable  Comments: Pt able to follow exercise prescription today without complaint.  Will continue to monitor for progression.    Dr. Bethann Punches is Medical Director for [facility] Cardiac Rehabilitation.  Dr. Vida Rigger is Medical Director for [facility] Pulmonary Rehabilitation.

## 2022-07-27 ENCOUNTER — Encounter: Payer: Medicare Other | Admitting: *Deleted

## 2022-07-27 DIAGNOSIS — I272 Pulmonary hypertension, unspecified: Secondary | ICD-10-CM | POA: Diagnosis not present

## 2022-07-27 NOTE — Progress Notes (Signed)
Daily Session Note  Patient Details  Name: Keyanah Over MRN: [id number] Date of Birth: Jun 13, 1940 Referring Provider:   Flowsheet Row Pulmonary Rehab from 06/14/2022 in [facility] Cardiac and Pulmonary Rehab  Referring Provider Bensimhon       Encounter Date: 07/27/2022  Check In:  Session Check In - 07/27/22 1036       Check-In   Supervising physician immediately available to respond to emergencies See telemetry face sheet for immediately available ER MD    Location [facility]-Cardiac & Pulmonary Rehab    Staff Present Lanny Hurst, RN, ADN;Meredith Jewel Baize, RN BSN;Denise Rhew, PhD, RN, CNS, CEN;Other   [place], [place]   Virtual Visit No    Medication changes reported     No    Fall or balance concerns reported    No    Warm-up and Cool-down Performed on first and last piece of equipment    Resistance Training Performed Yes    VAD Patient? No    PAD/SET Patient? No      Pain Assessment   Currently in Pain? No/denies                Social History   Tobacco Use  Smoking Status Never   Passive exposure: Past  Smokeless Tobacco Never    Goals Met:  Independence with exercise equipment Exercise tolerated well No report of concerns or symptoms today Strength training completed today  Goals Unmet:  Not Applicable  Comments: Pt able to follow exercise prescription today without complaint.  Will continue to monitor for progression.    Dr. Bethann Punches is Medical Director for [facility] Cardiac Rehabilitation.  Dr. Vida Rigger is Medical Director for [facility] Pulmonary Rehabilitation.

## 2022-07-30 NOTE — Progress Notes (Incomplete)
ADVANCED HF CLINIC NOTE  Referring Physician: Dr. Mariah Milling Primary Care: Karie Schwalbe, MD Primary Cardiologist: Dr. Lorenz Coaster   HPI:  [name] is a 82 year old woman with chronic AF, HTN, diastolic HF, moderate MR. PAD referred by Dr. Mariah Milling for further evaluation of her pulmonary HTN   Previously followed at [facility] Previous echos with normal systolic function and severe MR. Transferred care to Dr. Mariah Milling in 2023.   Developed SOB in 11/23. Saw Dr. Alphonsus Sias. Felt to have HF. CXR showed pulmonary edema.   Saw Dr. Marcos Eke and walk test on 01/31/22 with sats in the 80s. Did ONOX with sats down to 75% Started on nighttime O2 and lasix and Kcl. BNP 501   Saw Dr. Mariah Milling on 02/06/22 and 03/01/21. Lasix increased to 20 bid. Lopressor decreased.  Echo 12/23 EF 60-65% LA moderately dilated. Moderate MR. G3 DD. RV normal. RVSP 63mm HG  Non smoker. Husband was a smoker. No h/o DVT/PE. No h/o connective tissue disorder.  I ordered hi-res CT at last visit (2/24)  Hi res CT 3/24: No ILD. Air trapping c/w small airway disease. + lung nodules. + CAC  Echo 5/24 EF 55-60% G2DD RV normal RVSP 44 Personally reviewed Sleep study 5/24 AHI 3.7  Stopped Jardiance due to bad rash on face and neck.   At last visit I started [place]. Had GI symptoms and early April so stopped med. Now back on it and tolerating.  Here with her daughter. Keeping fluid off. Says she can do anything she wants to. Can go to store and do housework. Doing well with Pulmonary Rehab. No edema, orthopnea or PND BP at Pulmonary Rehab 120-130s. Occasional 150-160s. At home SBP 120s     PFTs 03/28/22 FEV1 1.21 (68%) FEVC 1.35 (56%) DLCO 60%  Past Medical History:  Diagnosis Date   Adenomatous colon polyp 2005   Allergy    perennial ; shots from Dr Corinda Gubler   Aortic insufficiency    Hyperlipidemia    Hypertension    Murmur    a.  04/2011 Echo:  EF 55-60%, mild-mod ai/mr   Squamous cell carcinoma of skin 08/05/2020    left pretibia EDC   Uterine cancer (HCC)     Current Outpatient Medications  Medication Sig Dispense Refill   albuterol (VENTOLIN HFA) 108 (90 Base) MCG/ACT inhaler Inhale 1-2 puffs into the lungs every 6 (six) hours as needed. 8 g 2   amiodarone (PACERONE) 200 MG tablet Take 0.5 tablets (100 mg total) by mouth daily. 45 tablet 1   amLODipine (NORVASC) 5 MG tablet Take 1 tablet (5 mg total) by mouth daily. 90 tablet 1   apixaban (ELIQUIS) 2.5 MG TABS tablet Take 1 tablet (2.5 mg total) by mouth 2 (two) times daily. 60 tablet 3   azelastine (ASTELIN) 0.1 % nasal spray Place 1 spray into both nostrils 2 (two) times daily. Use in each nostril as directed 30 mL 5   benzonatate (TESSALON) 200 MG capsule Take 1 capsule (200 mg total) by mouth 3 (three) times daily as needed. 45 capsule 1   cetirizine (ZYRTEC) 10 MG tablet Take 10 mg by mouth daily.     chlorpheniramine (CHLOR-TRIMETON) 4 MG tablet Take 4 mg by mouth daily.     dapagliflozin propanediol (FARXIGA) 10 MG TABS tablet Take 1 tablet (10 mg total) by mouth daily before breakfast. 30 tablet 3   doxycycline (VIBRA-TABS) 100 MG tablet Take 1 tablet (100 mg total) by mouth 2 (two) times daily. 14  tablet 0   EPINEPHrine 0.3 mg/0.3 mL IJ SOAJ injection Inject 0.3 mg into the muscle as needed for anaphylaxis.     famotidine (PEPCID) 20 MG tablet Take 20 mg by mouth daily.     fexofenadine (ALLEGRA) 180 MG tablet Take 180 mg by mouth daily.     fluticasone furoate-vilanterol (BREO ELLIPTA) 100-25 MCG/ACT AEPB Inhale 1 puff into the lungs daily. 30 each 5   furosemide (LASIX) 20 MG tablet Take 1 tablet (20 mg total) by mouth daily. 180 tablet 2   glucosamine-chondroitin 500-400 MG tablet Take 1 tablet by mouth 2 (two) times daily. 1 pm     KLOR-CON M20 20 MEQ tablet TAKE 1 TABLET (20 MEQ) TWICE EVERY OTHER DAY ALTERNATING WITH 1 TABLET (20 MEQ) ONCE EVERY OTHER DAY 135 tablet 1   Krill Oil 500 MG CAPS Take by mouth.     Multiple Vitamin  (MULTIVITAMIN) capsule Take 1 capsule by mouth daily.     pravastatin (PRAVACHOL) 40 MG tablet TAKE 1 TABLET BY MOUTH EVERYDAY AT BEDTIME (Patient taking differently: Once a day with Lasix) 90 tablet 3   prednisoLONE acetate (PRED FORTE) 1 % ophthalmic suspension Apply to eye.     predniSONE (DELTASONE) 20 MG tablet Take 1 tablet (20 mg total) by mouth daily with breakfast. 5 tablet 0   sertraline (ZOLOFT) 25 MG tablet TAKE 1 TABLET BY MOUTH EVERY DAY 100 tablet 3   sodium chloride (MURO 128) 5 % ophthalmic solution PLACE 1 DROP INTO RIGHT EYE 4 TIMES A DAY     triamcinolone (NASACORT ALLERGY 24HR) 55 MCG/ACT AERO nasal inhaler Place 2 sprays into the nose daily.     valsartan (DIOVAN) 160 MG tablet Take 1 tablet (160 mg total) by mouth 2 (two) times daily. 180 tablet 1   vitamin B-12 (CYANOCOBALAMIN) 500 MCG tablet Take 500 mcg by mouth daily.     No current facility-administered medications for this visit.    Allergies  Allergen Reactions   Amlodipine Besy-Benazepril Hcl Swelling    Lotrel caused swelling of feet & facial swelling unilaterally   Amlodipine Besy-Benazepril Hcl Other (See Comments)   Lotrel [Amlodipine Besy-Benazepril Hcl]    Molds & Smuts Other (See Comments)   Wasp Venom Swelling    Allergic to wasps      Social History   Socioeconomic History   Marital status: Widowed    Spouse name: Not on file   Number of children: 2   Years of education: Not on file   Highest education level: Not on file  Occupational History   Occupation: Warehouse manager for [employer]    Comment: Retired  Tobacco Use   Smoking status: Never    Passive exposure: Past   Smokeless tobacco: Never  Vaping Use   Vaping Use: Never used  Substance and Sexual Activity   Alcohol use: Yes    Comment: special events   Drug use: No   Sexual activity: Never  Other Topics Concern   Not on file  Social History Narrative   Widowed 12/19      Has living will   Children now would be health care POA    Would accept resuscitation but no prolonged ventilation   No tube feeds if cognitively unaware      2 daughters, 4 grandsons 1 great-grandson   Retired Warehouse manager from [employer]   Social Determinants of Corporate investment banker Strain: Not on [employer] Insecurity: Not on [employer]  Needs: Not on file  Physical Activity: Not on file  Stress: Not on file  Social Connections: Not on file  Intimate Partner Violence: Not on file      Family History  Problem Relation Age of Onset   Stroke Mother 2   Heart failure Mother    Heart attack Father 61   Lung cancer Father    Cancer Maternal Aunt         X 2; Gyn & ? primary   Diabetes Maternal Grandmother    Esophageal cancer Neg Hx    Colon cancer Neg Hx    Stomach cancer Neg Hx    Rectal cancer Neg Hx     Vitals:   07/31/22 1124  BP: (!) 155/51  Pulse: 71  SpO2: 99%  Weight: 125 lb (56.7 kg)  Height: 5\' 3"  (1.6 m)   Wt Readings from Last 3 Encounters:  07/31/22 125 lb (56.7 kg)  06/14/22 126 lb 11.2 oz (57.5 kg)  06/01/22 126 lb 6.4 oz (57.3 kg)    PHYSICAL EXAM: General:  Well appearing. No resp difficulty HEENT: normal Neck: supple. no JVD. Carotids 2+ bilat; no bruits. No lymphadenopathy or thryomegaly appreciated. Cor: PMI nondisplaced. Regular rate & rhythm. 2/6 SEM at RUSB S2 ok  Lungs: clear Abdomen: soft, nontender, nondistended. No hepatosplenomegaly. No bruits or masses. Good bowel sounds. Extremities: no cyanosis, clubbing, rash, edema Neuro: alert & orientedx3, cranial nerves grossly intact. moves all 4 extremities w/o difficulty. Affect pleasant  ASSESSMENT & PLAN:   1. 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 RV is normal - mainstay of therapy is volume control and BP management - However, recent PFTs with DLCO show restrictive physiology with a diffusion defect suggestive of interstitial process  - Echo 12/23 EF 60-65% LA  moderately dilated. Moderate MR. G3 DD. RV normal. RVSP 63mm HG - Hi res CT 3/24: No ILD. Air trapping c/w small airway disease. + lung nodules. + CAC - Echo 5/24 EF 55-60% G2DD RV normal RVSP 44 Personally reviewed - Functional capacity much improved with diuresis. NYHA I-II - Volume status ok. Continue lasix 20 daily. - Jardiance stopped due to rash/allergy.  - Windell Moment so far.  - Chest CT 3/24 no ILD  - Echo 5/24 EF 55-60% G2DD RV normal RVSP 44 Personally reviewed - Sleep study 5/24 AHI 3.7  - No role for  R +/- L heart cath at this time   2. Diastolic HF - Doing well. Plan as above  3.  Paroxysmal AF - In NSR today. Continue amio 100 and Eliquis - Managed by Dr. Mariah Milling - Given normal EF and underlying lung disease, I wonder if it would be possible to switch off amio (? Flecainide)   4. Hypoxemia - followed by Dr. Marcos Eke - likely multifactorial - Sleep study 5/24 AHI 3.7 - plan as above  5. HTN - Blood pressure high today here.But BP at home well controlled   RTC in 3 months  Arvilla Meres, MD  11:36 AM

## 2022-07-31 ENCOUNTER — Ambulatory Visit: Payer: Medicare Other | Attending: Internal Medicine | Admitting: Internal Medicine

## 2022-07-31 ENCOUNTER — Telehealth: Payer: Self-pay

## 2022-07-31 VITALS — BP 155/51 | HR 71 | Ht 63.0 in | Wt 125.0 lb

## 2022-07-31 DIAGNOSIS — I480 Paroxysmal atrial fibrillation: Secondary | ICD-10-CM | POA: Diagnosis not present

## 2022-07-31 DIAGNOSIS — I10 Essential (primary) hypertension: Secondary | ICD-10-CM

## 2022-07-31 DIAGNOSIS — I5032 Chronic diastolic (congestive) heart failure: Secondary | ICD-10-CM

## 2022-07-31 DIAGNOSIS — I272 Pulmonary hypertension, unspecified: Secondary | ICD-10-CM | POA: Diagnosis not present

## 2022-07-31 NOTE — Patient Instructions (Signed)
  Special Instructions // Education:  Do the following things EVERYDAY: Weigh yourself in the morning before breakfast. Write it down and keep it in a log. Take your medicines as prescribed Eat low salt foods--Limit salt (sodium) to 2000 mg per day.  Stay as active as you can everyday Limit all fluids for the day to less than 2 liters   Follow-Up in: follow up in 3 months with Dr. Gala Romney. Please call Dr. Mariah Milling to schedule a follow up appointment.    If you have any questions or concerns before your next appointment please send [place] a message through [place] or call our office at [phone] Monday-Friday 8 am-5 pm.   If you have an urgent need after hours on the weekend please call your Primary Cardiologist or the Advanced [facility] Failure [facility] in [place] at [phone].

## 2022-08-01 ENCOUNTER — Encounter: Payer: Medicare Other | Attending: Internal Medicine | Admitting: *Deleted

## 2022-08-01 DIAGNOSIS — I272 Pulmonary hypertension, unspecified: Secondary | ICD-10-CM | POA: Diagnosis not present

## 2022-08-01 DIAGNOSIS — J3081 Allergic rhinitis due to animal (cat) (dog) hair and dander: Secondary | ICD-10-CM | POA: Diagnosis not present

## 2022-08-01 DIAGNOSIS — J3089 Other allergic rhinitis: Secondary | ICD-10-CM | POA: Diagnosis not present

## 2022-08-01 DIAGNOSIS — J301 Allergic rhinitis due to pollen: Secondary | ICD-10-CM | POA: Diagnosis not present

## 2022-08-01 NOTE — Progress Notes (Signed)
Daily Session Note  Patient Details  Name: [name] MRN: [id number] Date of Birth: [birth year] Referring Provider:   Flowsheet Row Pulmonary Rehab from 06/14/2022 in [facility] Cardiac and Pulmonary Rehab  Referring Provider Bensimhon       Encounter Date: 08/01/2022  Check In:  Session Check In - 08/01/22 1122       Check-In   Supervising physician immediately available to respond to emergencies See telemetry face sheet for immediately available ER MD    Location [facility]-Cardiac & Pulmonary Rehab    Staff Present Cyndia Diver, RN, BSN, MA;Susanne Bice, RN, BSN, CCRP;Jessica Hawkins, MA, RCEP, CCRP, CCET    Virtual Visit No    Medication changes reported     No    Fall or balance concerns reported    No    Tobacco Cessation No Change    Warm-up and Cool-down Performed on first and last piece of equipment    Resistance Training Performed Yes    VAD Patient? No    PAD/SET Patient? No      Pain Assessment   Currently in Pain? No/denies                Social History   Tobacco Use  Smoking Status Never   Passive exposure: Past  Smokeless Tobacco Never    Goals Met:  Independence with exercise equipment Exercise tolerated well No report of concerns or symptoms today  Goals Unmet:  Not Applicable  Comments: Pt able to follow exercise prescription today without complaint.  Will continue to monitor for progression.    Dr. Bethann Punches is Medical Director for [facility] Cardiac Rehabilitation.  Dr. Vida Rigger is Medical Director for [facility] Pulmonary Rehabilitation.

## 2022-08-03 ENCOUNTER — Encounter: Payer: Medicare Other | Admitting: *Deleted

## 2022-08-03 DIAGNOSIS — I272 Pulmonary hypertension, unspecified: Secondary | ICD-10-CM | POA: Diagnosis not present

## 2022-08-03 NOTE — Progress Notes (Signed)
Daily Session Note  Patient Details  Name: [name] MRN: [id number] Date of Birth: [birth year] Referring Provider:   Flowsheet Row Pulmonary Rehab from 06/14/2022 in [facility] Cardiac and Pulmonary Rehab  Referring Provider Bensimhon       Encounter Date: 08/03/2022  Check In:  Session Check In - 08/03/22 1137       Check-In   Supervising physician immediately available to respond to emergencies See telemetry face sheet for immediately available ER MD    Location [facility]-Cardiac & Pulmonary Rehab    Staff Present Melissa Collum, RN, BSN, CCRP;[name], RCP,RRT,BSRT;Other   [name]   Virtual Visit No    Medication changes reported     No    Fall or balance concerns reported    No    Warm-up and Cool-down Performed on first and last piece of equipment    Resistance Training Performed Yes    VAD Patient? No    PAD/SET Patient? No      Pain Assessment   Currently in Pain? No/denies                Social History   Tobacco Use  Smoking Status Never   Passive exposure: Past  Smokeless Tobacco Never    Goals Met:  Proper associated with RPD/PD & O2 Sat Independence with exercise equipment Exercise tolerated well No report of concerns or symptoms today  Goals Unmet:  Not Applicable  Comments: Pt able to follow exercise prescription today without complaint.  Will continue to monitor for progression.    Dr. Bethann Punches is Medical Director for [facility] Cardiac Rehabilitation.  Dr. Vida Rigger is Medical Director for [facility] Pulmonary Rehabilitation.

## 2022-08-05 ENCOUNTER — Other Ambulatory Visit: Payer: Self-pay | Admitting: Cardiovascular Disease

## 2022-08-08 ENCOUNTER — Encounter: Payer: Medicare Other | Admitting: *Deleted

## 2022-08-08 DIAGNOSIS — I272 Pulmonary hypertension, unspecified: Secondary | ICD-10-CM | POA: Diagnosis not present

## 2022-08-08 NOTE — Progress Notes (Signed)
Daily Session Note  Patient Details  Name: [name] MRN: [id number] Date of Birth: October 26, 1940 Referring Provider:   Flowsheet Row Pulmonary Rehab from 06/14/2022 in [facility] Cardiac and Pulmonary Rehab  Referring Provider Bensimhon       Encounter Date: 08/08/2022  Check In:  Session Check In - 08/08/22 1025       Check-In   Supervising physician immediately available to respond to emergencies See telemetry face sheet for immediately available ER MD    Location [facility]-Cardiac & Pulmonary Rehab    Staff Present Susann Givens, RN BSN;Susanne Bice, RN, BSN, CCRP;Joseph Zion, RCP,RRT,BSRT    Virtual Visit No    Medication changes reported     No    Fall or balance concerns reported    No    Warm-up and Cool-down Performed on first and last piece of equipment    Resistance Training Performed Yes    VAD Patient? No    PAD/SET Patient? No      Pain Assessment   Currently in Pain? No/denies                Social History   Tobacco Use  Smoking Status Never   Passive exposure: Past  Smokeless Tobacco Never    Goals Met:  Independence with exercise equipment Exercise tolerated well No report of concerns or symptoms today Strength training completed today  Goals Unmet:  Not Applicable  Comments: Pt able to follow exercise prescription today without complaint.  Will continue to monitor for progression.    Dr. Bethann Punches is Medical Director for [facility] Cardiac Rehabilitation.  Dr. Vida Rigger is Medical Director for [facility] Pulmonary Rehabilitation.

## 2022-08-09 ENCOUNTER — Encounter: Payer: Self-pay | Admitting: *Deleted

## 2022-08-09 DIAGNOSIS — I272 Pulmonary hypertension, unspecified: Secondary | ICD-10-CM

## 2022-08-09 NOTE — Progress Notes (Signed)
Pulmonary Individual Treatment Plan  Patient Details  Name: Ocelia Paslay MRN: [id number] Date of Birth: 1940-06-10 Referring Provider:   Flowsheet Row Pulmonary Rehab from 06/14/2022 in [facility] Cardiac and Pulmonary Rehab  Referring Provider Bensimhon       Initial Encounter Date:  Flowsheet Row Pulmonary Rehab from 06/14/2022 in [facility] Cardiac and Pulmonary Rehab  Date 06/14/22       Visit Diagnosis: Pulmonary HTN (HCC)  Patient's Home Medications on Admission:  Current Outpatient Medications:    albuterol (VENTOLIN HFA) 108 (90 Base) MCG/ACT inhaler, Inhale 1-2 puffs into the lungs every 6 (six) hours as needed., Disp: 8 g, Rfl: 2   amiodarone (PACERONE) 200 MG tablet, Take 0.5 tablets (100 mg total) by mouth daily., Disp: 45 tablet, Rfl: 1   amLODipine (NORVASC) 5 MG tablet, TAKE 1 TABLET (5 MG TOTAL) BY MOUTH DAILY., Disp: 90 tablet, Rfl: 0   apixaban (ELIQUIS) 2.5 MG TABS tablet, Take 1 tablet (2.5 mg total) by mouth 2 (two) times daily., Disp: 60 tablet, Rfl: 3   azelastine (ASTELIN) 0.1 % nasal spray, Place 1 spray into both nostrils 2 (two) times daily. Use in each nostril as directed, Disp: 30 mL, Rfl: 5   benzonatate (TESSALON) 200 MG capsule, Take 1 capsule (200 mg total) by mouth 3 (three) times daily as needed., Disp: 45 capsule, Rfl: 1   cetirizine (ZYRTEC) 10 MG tablet, Take 10 mg by mouth daily., Disp: , Rfl:    chlorpheniramine (CHLOR-TRIMETON) 4 MG tablet, Take 4 mg by mouth daily., Disp: , Rfl:    dapagliflozin propanediol (FARXIGA) 10 MG TABS tablet, Take 1 tablet (10 mg total) by mouth daily before breakfast., Disp: 30 tablet, Rfl: 3   doxycycline (VIBRA-TABS) 100 MG tablet, Take 1 tablet (100 mg total) by mouth 2 (two) times daily., Disp: 14 tablet, Rfl: 0   EPINEPHrine 0.3 mg/0.3 mL IJ SOAJ injection, Inject 0.3 mg into the muscle as needed for anaphylaxis., Disp: , Rfl:    famotidine (PEPCID) 20 MG tablet, Take 20 mg by mouth daily., Disp: , Rfl:     fexofenadine (ALLEGRA) 180 MG tablet, Take 180 mg by mouth daily., Disp: , Rfl:    fluticasone furoate-vilanterol (BREO ELLIPTA) 100-25 MCG/ACT AEPB, Inhale 1 puff into the lungs daily., Disp: 30 each, Rfl: 5   furosemide (LASIX) 20 MG tablet, Take 1 tablet (20 mg total) by mouth daily., Disp: 180 tablet, Rfl: 2   glucosamine-chondroitin 500-400 MG tablet, Take 1 tablet by mouth 2 (two) times daily. 1 pm, Disp: , Rfl:    KLOR-CON M20 20 MEQ tablet, TAKE 1 TABLET (20 MEQ) TWICE EVERY OTHER DAY ALTERNATING WITH 1 TABLET (20 MEQ) ONCE EVERY OTHER DAY, Disp: 135 tablet, Rfl: 1   Krill Oil 500 MG CAPS, Take by mouth., Disp: , Rfl:    Multiple Vitamin (MULTIVITAMIN) capsule, Take 1 capsule by mouth daily., Disp: , Rfl:    pravastatin (PRAVACHOL) 40 MG tablet, TAKE 1 TABLET BY MOUTH EVERYDAY AT BEDTIME (Patient taking differently: Once a day with Lasix), Disp: 90 tablet, Rfl: 3   prednisoLONE acetate (PRED FORTE) 1 % ophthalmic suspension, Apply to eye., Disp: , Rfl:    predniSONE (DELTASONE) 20 MG tablet, Take 1 tablet (20 mg total) by mouth daily with breakfast., Disp: 5 tablet, Rfl: 0   sertraline (ZOLOFT) 25 MG tablet, TAKE 1 TABLET BY MOUTH EVERY DAY, Disp: 100 tablet, Rfl: 3   sodium chloride (MURO 128) 5 % ophthalmic solution, PLACE 1 DROP INTO RIGHT  EYE 4 TIMES A DAY, Disp: , Rfl:    triamcinolone (NASACORT ALLERGY 24HR) 55 MCG/ACT AERO nasal inhaler, Place 2 sprays into the nose daily., Disp: , Rfl:    valsartan (DIOVAN) 160 MG tablet, Take 1 tablet (160 mg total) by mouth 2 (two) times daily., Disp: 180 tablet, Rfl: 1   vitamin B-12 (CYANOCOBALAMIN) 500 MCG tablet, Take 500 mcg by mouth daily., Disp: , Rfl:   Past Medical History: Past Medical History:  Diagnosis Date   Adenomatous colon polyp 2005   Allergy    perennial ; shots from Dr Corinda Gubler   Aortic insufficiency    Hyperlipidemia    Hypertension    Murmur    a.  04/2011 Echo:  EF 55-60%, mild-mod ai/mr   Squamous cell carcinoma of  skin 08/05/2020   left pretibia EDC   Uterine cancer (HCC)     Tobacco Use: Social History   Tobacco Use  Smoking Status Never   Passive exposure: Past  Smokeless Tobacco Never    Labs: Review Flowsheet  More data exists      Latest Ref Rng & Units 05/31/2016 06/21/2017 08/14/2018 12/03/2020 12/19/2021  Labs for ITP Cardiac and Pulmonary Rehab  Cholestrol 0 - 200 mg/dL 409  811  914  782  956   LDL (calc) 0 - 99 mg/dL 91  77  71  60  77   HDL-C >39.00 mg/dL 21.30  86.57  84.69  62.95  50.20   Trlycerides 0.0 - 149.0 mg/dL 28.4  13.2  44.0  10.2  68.0   Hemoglobin A1c 4.6 - 6.5 % 5.8  5.8  5.8  - -     Pulmonary Assessment Scores:  Pulmonary Assessment Scores     Row Name 06/14/22 1444         ADL UCSD   ADL Phase Entry     SOB Score total 7     Rest 0     Walk 0     Stairs 1     Bath 0     Dress 0     Shop 0       CAT Score   CAT Score 5       mMRC Score   mMRC Score 2              UCSD: Self-administered rating of dyspnea associated with activities of daily living (ADLs) 6-point scale (0 = "not at all" to 5 = "maximal or unable to do because of breathlessness")  Scoring Scores range from 0 to 120.  Minimally important difference is 5 units  CAT: CAT can identify the health impairment of COPD patients and is better correlated with disease progression.  CAT has a scoring range of zero to 40. The CAT score is classified into four groups of low (less than 10), medium (10 - 20), high (21-30) and very high (31-40) based on the impact level of disease on health status. A CAT score over 10 suggests significant symptoms.  A worsening CAT score could be explained by an exacerbation, poor medication adherence, poor inhaler technique, or progression of COPD or comorbid conditions.  CAT MCID is 2 points  mMRC: mMRC (Modified Medical Research Council) Dyspnea Scale is used to assess the degree of baseline functional disability in patients of respiratory disease due to  dyspnea. No minimal important difference is established. A decrease in score of 1 point or greater is considered a positive change.   Pulmonary Function Assessment:  Exercise Target Goals: Exercise Program Goal: Individual exercise prescription set using results from initial 6 min walk test and THRR while considering  patient's activity barriers and safety.   Exercise Prescription Goal: Initial exercise prescription builds to 30-45 minutes a day of aerobic activity, 2-3 days per week.  Home exercise guidelines will be given to patient during program as part of exercise prescription that the participant will acknowledge.  Education: Aerobic Exercise: - Group verbal and visual presentation on the components of exercise prescription. Introduces F.I.T.T principle from ACSM for exercise prescriptions.  Reviews F.I.T.T. principles of aerobic exercise including progression. Written material given at graduation.   Education: Resistance Exercise: - Group verbal and visual presentation on the components of exercise prescription. Introduces F.I.T.T principle from ACSM for exercise prescriptions  Reviews F.I.T.T. principles of resistance exercise including progression. Written material given at graduation.    Education: Exercise & Equipment Safety: - Individual verbal instruction and demonstration of equipment use and safety with use of the equipment. Flowsheet Row Pulmonary Rehab from 07/13/2022 in [facility] Cardiac and Pulmonary Rehab  Date 06/14/22  Educator [facility]  Instruction Review Code 1- Verbalizes Understanding       Education: Exercise Physiology & General Exercise Guidelines: - Group verbal and written instruction with models to review the exercise physiology of the cardiovascular system and associated critical values. Provides general exercise guidelines with specific guidelines to those with heart or lung disease.    Education: Flexibility, Balance, Mind/Body Relaxation: - Group verbal  and visual presentation with interactive activity on the components of exercise prescription. Introduces F.I.T.T principle from ACSM for exercise prescriptions. Reviews F.I.T.T. principles of flexibility and balance exercise training including progression. Also discusses the mind body connection.  Reviews various relaxation techniques to help reduce and manage stress (i.e. Deep breathing, progressive muscle relaxation, and visualization). Balance handout provided to take home. Written material given at graduation.   Activity Barriers & Risk Stratification:  Activity Barriers & Cardiac Risk Stratification - 06/14/22 1342       Activity Barriers & Cardiac Risk Stratification   Activity Barriers Back Problems             6 Minute Walk:  6 Minute Walk     Row Name 06/14/22 1321         6 Minute Walk   Phase Initial     Distance 1080 feet     Walk Time 6 minutes     # of Rest Breaks 0     MPH 2.05     METS 1.97     RPE 12     Perceived Dyspnea  0     VO2 Peak 6.88     Symptoms No     Resting HR 71 bpm     Resting BP 138/82     Resting Oxygen Saturation  96 %     Exercise Oxygen Saturation  during 6 min walk 94 %     Max Ex. HR 80 bpm     Max Ex. BP 160/62     2 Minute Post BP 142/72       Interval HR   1 Minute HR 77     2 Minute HR 78     3 Minute HR 78     4 Minute HR 79     5 Minute HR 79     6 Minute HR 80     2 Minute Post HR 69     Interval Heart Rate? Yes  Interval Oxygen   Interval Oxygen? Yes     Baseline Oxygen Saturation % 96 %     1 Minute Oxygen Saturation % 97 %     1 Minute Liters of Oxygen 0 L     2 Minute Oxygen Saturation % 94 %     2 Minute Liters of Oxygen 0 L     3 Minute Oxygen Saturation % 94 %     3 Minute Liters of Oxygen 0 L     4 Minute Oxygen Saturation % 95 %     4 Minute Liters of Oxygen 0 L     5 Minute Oxygen Saturation % 96 %     5 Minute Liters of Oxygen 0 L     6 Minute Oxygen Saturation % 97 %     6 Minute Liters of  Oxygen 0 L     2 Minute Post Oxygen Saturation % 98 %     2 Minute Post Liters of Oxygen 0 L             Oxygen Initial Assessment:  Oxygen Initial Assessment - 06/14/22 1444       Home Oxygen   Home Oxygen Device None    Sleep Oxygen Prescription Continuous    Liters per minute 2    Home Exercise Oxygen Prescription None    Home Resting Oxygen Prescription None      Initial 6 min Walk   Oxygen Used None      Program Oxygen Prescription   Program Oxygen Prescription None      Intervention   Short Term Goals To learn and understand importance of monitoring SPO2 with pulse oximeter and demonstrate accurate use of the pulse oximeter.;To learn and understand importance of maintaining oxygen saturations>88%;To learn and demonstrate proper pursed lip breathing techniques or other breathing techniques. ;To learn and demonstrate proper use of respiratory medications    Long  Term Goals Verbalizes importance of monitoring SPO2 with pulse oximeter and return demonstration;Maintenance of O2 saturations>88%;Exhibits proper breathing techniques, such as pursed lip breathing or other method taught during program session;Compliance with respiratory medication;Demonstrates proper use of MDI's             Oxygen Re-Evaluation:  Oxygen Re-Evaluation     Row Name 06/20/22 1003 07/04/22 1358 07/27/22 1019         Program Oxygen Prescription   Program Oxygen Prescription -- None None       Home Oxygen   Home Oxygen Device -- None Home Concentrator     Sleep Oxygen Prescription -- Continuous Continuous     Liters per minute -- 2 1     Home Exercise Oxygen Prescription -- None None     Home Resting Oxygen Prescription -- None None     Compliance with Home Oxygen Use -- -- Yes       Goals/Expected Outcomes   Short Term Goals -- To learn and understand importance of monitoring SPO2 with pulse oximeter and demonstrate accurate use of the pulse oximeter.;To learn and understand importance  of maintaining oxygen saturations>88%;To learn and demonstrate proper pursed lip breathing techniques or other breathing techniques. ;To learn and demonstrate proper use of respiratory medications To learn and understand importance of maintaining oxygen saturations>88%;To learn and understand importance of monitoring SPO2 with pulse oximeter and demonstrate accurate use of the pulse oximeter.     Long  Term Goals -- Verbalizes importance of monitoring SPO2 with pulse oximeter and return demonstration;Maintenance of  O2 saturations>88%;Exhibits proper breathing techniques, such as pursed lip breathing or other method taught during program session;Compliance with respiratory medication;Demonstrates proper use of MDI's Verbalizes importance of monitoring SPO2 with pulse oximeter and return demonstration;Maintenance of O2 saturations>88%     Comments Reviewed PLB technique with pt.  Talked about how it works and it's importance in maintaining their exercise saturations. Winifred states she is compliant with using her sleep oxygen. She does have a pulse ox to monitor her HR and O2 levels and is aware to make sure her oxygen levels stays above 88%. We talked more about PLB today and she is going to practice it more. She experiences a lot of sinus issues which impacts her SOB at times, but continues to take medications for it to help relieve some symptoms. She has a pulse oximeter to check her oxygen saturation at home. Informed him/her where to get one and explained why it is important to have one. Reviewed that oxygen saturations should be 88 percent and above.     Goals/Expected Outcomes Short: Become more profiecient at using PLB. Long: Become independent at using PLB. Short: Continue to monitor O2 saturations closely, notify of any concerns Long: Continue to become proficient at using PLB and monitoring oxygen Short: monitor oxygen at home with exertion. Long: maintain oxygen saturations above 88 percent  independently.              Oxygen Discharge (Final Oxygen Re-Evaluation):  Oxygen Re-Evaluation - 07/27/22 1019       Program Oxygen Prescription   Program Oxygen Prescription None      Home Oxygen   Home Oxygen Device Home Concentrator    Sleep Oxygen Prescription Continuous    Liters per minute 1    Home Exercise Oxygen Prescription None    Home Resting Oxygen Prescription None    Compliance with Home Oxygen Use Yes      Goals/Expected Outcomes   Short Term Goals To learn and understand importance of maintaining oxygen saturations>88%;To learn and understand importance of monitoring SPO2 with pulse oximeter and demonstrate accurate use of the pulse oximeter.    Long  Term Goals Verbalizes importance of monitoring SPO2 with pulse oximeter and return demonstration;Maintenance of O2 saturations>88%    Comments She has a pulse oximeter to check her oxygen saturation at home. Informed him/her where to get one and explained why it is important to have one. Reviewed that oxygen saturations should be 88 percent and above.    Goals/Expected Outcomes Short: monitor oxygen at home with exertion. Long: maintain oxygen saturations above 88 percent independently.             Initial Exercise Prescription:  Initial Exercise Prescription - 06/14/22 1300       Date of Initial Exercise RX and Referring Provider   Date 06/14/22    Referring Provider Bensimhon      Oxygen   Maintain Oxygen Saturation 88% or higher      Recumbant Bike   Level 2    RPM 50    Watts 15    Minutes 15      NuStep   Level 2    SPM 80    Minutes 15    METs 1.97      REL-XR   Level 2    Speed 50    Minutes 15    METs 1.97      Track   Laps 30    Minutes 15    METs 2.63  Prescription Details   Frequency (times per week) 2    Duration Progress to 30 minutes of continuous aerobic without signs/symptoms of physical distress      Intensity   THRR 40-80% of Max Heartrate 97-124     Ratings of Perceived Exertion 11-13    Perceived Dyspnea 0-4      Progression   Progression Continue to progress workloads to maintain intensity without signs/symptoms of physical distress.      Resistance Training   Training Prescription Yes    Weight 2    Reps 10-15             Perform Capillary Blood Glucose checks as needed.  Exercise Prescription Changes:   Exercise Prescription Changes     Row Name 06/14/22 1300 07/03/22 1200 07/04/22 1100 07/17/22 1400 08/01/22 0700     Response to Exercise   Blood Pressure (Admit) 138/82 114/58 -- 130/62 138/60   Blood Pressure (Exercise) 160/62 126/58 -- 134/62 158/60   Blood Pressure (Exit) 142/72 118/60 -- 120/58 132/58   Heart Rate (Admit) 71 bpm 65 bpm -- 71 bpm 63 bpm   Heart Rate (Exercise) 80 bpm 98 bpm -- 98 bpm 80 bpm   Heart Rate (Exit) 69 bpm 58 bpm -- 72 bpm 65 bpm   Oxygen Saturation (Admit) 96 % 97 % -- 94 % 92 %   Oxygen Saturation (Exercise) 94 % 91 % -- 90 % 97 %   Oxygen Saturation (Exit) 98 % 94 % -- 94 % 97 %   Rating of Perceived Exertion (Exercise) 12 13 -- 13 13   Perceived Dyspnea (Exercise) 0 2 -- 2 --   Symptoms none SOB -- SOB none   Comments 6 MWT results 2nd full week of exercise -- -- --   Duration -- Progress to 30 minutes of  aerobic without signs/symptoms of physical distress -- Continue with 30 min of aerobic exercise without signs/symptoms of physical distress. Continue with 30 min of aerobic exercise without signs/symptoms of physical distress.   Intensity -- THRR unchanged -- THRR unchanged THRR unchanged     Progression   Progression -- Continue to progress workloads to maintain intensity without signs/symptoms of physical distress. -- Continue to progress workloads to maintain intensity without signs/symptoms of physical distress. Continue to progress workloads to maintain intensity without signs/symptoms of physical distress.   Average METs -- 2.56 -- 2.3 2.69     Resistance Training    Training Prescription -- Yes -- Yes Yes   Weight -- 2 lb -- 2 lb 3 lb   Reps -- 10-15 -- 10-15 10-15     Interval Training   Interval Training -- No -- No No     NuStep   Level -- 1 -- 2 2   Minutes -- 15 -- 15 15   METs -- 2.1 -- 2.3 2.2     REL-XR   Level -- 1 -- 1 2   Minutes -- 15 -- 15 15   METs -- 3.3 -- 2.5 --     Track   Laps -- 27 -- 30 40   Minutes -- 15 -- 15 15   METs -- 2.47 -- 2.63 3.18     Home Exercise Plan   Plans to continue exercise at -- -- Home (comment)  walking Home (comment)  walking Home (comment)  walking   Frequency -- -- Add 2 additional days to program exercise sessions. Add 2 additional days to program exercise sessions. Add  2 additional days to program exercise sessions.   Initial Home Exercises Provided -- -- 07/04/22 07/04/22 07/04/22     Oxygen   Maintain Oxygen Saturation -- 88% or higher 88% or higher 88% or higher 88% or higher            Exercise Comments:   Exercise Comments     Row Name 06/20/22 1002           Exercise Comments First full day of exercise!  Patient was oriented to gym and equipment including functions, settings, policies, and procedures.  Patient's individual exercise prescription and treatment plan were reviewed.  All starting workloads were established based on the results of the 6 minute walk test done at initial orientation visit.  The plan for exercise progression was also introduced and progression will be customized based on patient's performance and goals.                Exercise Goals and Review:   Exercise Goals     Row Name 06/14/22 1346             Exercise Goals   Increase Physical Activity Yes       Intervention Provide advice, education, support and counseling about physical activity/exercise needs.;Develop an individualized exercise prescription for aerobic and resistive training based on initial evaluation findings, risk stratification, comorbidities and participant's personal  goals.       Expected Outcomes Short Term: Attend rehab on a regular basis to increase amount of physical activity.;Long Term: Add in home exercise to make exercise part of routine and to increase amount of physical activity.;Long Term: Exercising regularly at least 3-5 days a week.       Increase Strength and Stamina Yes       Intervention Provide advice, education, support and counseling about physical activity/exercise needs.;Develop an individualized exercise prescription for aerobic and resistive training based on initial evaluation findings, risk stratification, comorbidities and participant's personal goals.       Expected Outcomes Short Term: Increase workloads from initial exercise prescription for resistance, speed, and METs.;Short Term: Perform resistance training exercises routinely during rehab and add in resistance training at home;Long Term: Improve cardiorespiratory fitness, muscular endurance and strength as measured by increased METs and functional capacity ( )       Able to understand and use rate of perceived exertion (RPE) scale Yes       Intervention Provide education and explanation on how to use RPE scale       Expected Outcomes Short Term: Able to use RPE daily in rehab to express subjective intensity level;Long Term:  Able to use RPE to guide intensity level when exercising independently       Able to understand and use Dyspnea scale Yes       Intervention Provide education and explanation on how to use Dyspnea scale       Expected Outcomes Short Term: Able to use Dyspnea scale daily in rehab to express subjective sense of shortness of breath during exertion;Long Term: Able to use Dyspnea scale to guide intensity level when exercising independently       Knowledge and understanding of Target Heart Rate Range (THRR) Yes       Intervention Provide education and explanation of THRR including how the numbers were predicted and where they are located for reference       Expected  Outcomes Short Term: Able to state/look up THRR;Long Term: Able to use THRR to govern intensity when exercising independently;Short  Term: Able to use daily as guideline for intensity in rehab       Able to check pulse independently Yes       Intervention Provide education and demonstration on how to check pulse in carotid and radial arteries.;Review the importance of being able to check your own pulse for safety during independent exercise       Expected Outcomes Short Term: Able to explain why pulse checking is important during independent exercise;Long Term: Able to check pulse independently and accurately       Understanding of Exercise Prescription Yes       Intervention Provide education, explanation, and written materials on patient's individual exercise prescription       Expected Outcomes Short Term: Able to explain program exercise prescription;Long Term: Able to explain home exercise prescription to exercise independently                Exercise Goals Re-Evaluation :  Exercise Goals Re-Evaluation     Row Name 06/20/22 1002 07/03/22 1236 07/04/22 1147 07/17/22 1435 08/01/22 0734     Exercise Goal Re-Evaluation   Exercise Goals Review Able to understand and use rate of perceived exertion (RPE) scale;Able to understand and use Dyspnea scale;Knowledge and understanding of Target Heart Rate Range (THRR);Understanding of Exercise Prescription Increase Physical Activity;Increase Strength and Stamina;Understanding of Exercise Prescription Increase Physical Activity;Increase Strength and Stamina;Understanding of Exercise Prescription Increase Physical Activity;Increase Strength and Stamina;Understanding of Exercise Prescription Increase Physical Activity;Increase Strength and Stamina;Understanding of Exercise Prescription   Comments Reviewed RPE scale, THR and program prescription with pt today.  Pt voiced understanding and was given a copy of goals to take home. Damonie has done well for her  first couple of weeks she has been at rehab. She has been able to reach 27 laps on the track- her goal is 30 and we hope to see her reach that! She also has exercised at level 1 on the both the XR and T4 Nustep. Her oxygen saturations are staying above 88% and RPEs are appropriate. Will continue to monitor. Reviewed home exercise with pt today.  Pt plans to walk for exercise at this time.  Belisa was part of the [employer] back in 2019 and is thinking about re-joining again. Reviewed THR, pulse, RPE, sign and symptoms, pulse oximetery and when to call 911 or MD.  Also discussed weather considerations and indoor options.  Pt voiced understanding. Soon is doing well in the program. She recently increased her laps on the track to 30 laps. She also improved to level 2 on the T4 nustep. She has continued to use 3 lb hand weights for resistance training as well. We will continue to monitor her progress in the program. Athalee continues to do well in rehab.  She is now up to 40 laps aiming to get to a mile!  We will conitnue to monitor her progress.   Expected Outcomes Short: Use RPE daily to regulate intensity. Long: Follow program prescription in [facility]. Short: Increase laps to 30 Long: Increase overall MET level and stamina Short: monitor HR and O2 closely when exercising Long: Continue to exercise routinely independently at home Short: Continue to push for more laps on the track. Long: Continue to improve strength and stamina. Short: Aim for a mile walking Long: conitnue to improve stamina            Discharge Exercise Prescription (Final Exercise Prescription Changes):  Exercise Prescription Changes - 08/01/22 0700       Response  to Exercise   Blood Pressure (Admit) 138/60    Blood Pressure (Exercise) 158/60    Blood Pressure (Exit) 132/58    Heart Rate (Admit) 63 bpm    Heart Rate (Exercise) 80 bpm    Heart Rate (Exit) 65 bpm    Oxygen Saturation (Admit) 92 %    Oxygen Saturation (Exercise) 97  %    Oxygen Saturation (Exit) 97 %    Rating of Perceived Exertion (Exercise) 13    Symptoms none    Duration Continue with 30 min of aerobic exercise without signs/symptoms of physical distress.    Intensity THRR unchanged      Progression   Progression Continue to progress workloads to maintain intensity without signs/symptoms of physical distress.    Average METs 2.69      Resistance Training   Training Prescription Yes    Weight 3 lb    Reps 10-15      Interval Training   Interval Training No      NuStep   Level 2    Minutes 15    METs 2.2      REL-XR   Level 2    Minutes 15      Track   Laps 40    Minutes 15    METs 3.18      Home Exercise Plan   Plans to continue exercise at Home (comment)   walking   Frequency Add 2 additional days to program exercise sessions.    Initial Home Exercises Provided 07/04/22      Oxygen   Maintain Oxygen Saturation 88% or higher             Nutrition:  Target Goals: Understanding of nutrition guidelines, daily intake of sodium 1500mg , cholesterol 200mg , calories 30% from fat and 7% or less from saturated fats, daily to have 5 or more servings of fruits and vegetables.  Education: All About Nutrition: -Group instruction provided by verbal, written material, interactive activities, discussions, models, and posters to present general guidelines for heart healthy nutrition including fat, fiber, MyPlate, the role of sodium in heart healthy nutrition, utilization of the nutrition label, and utilization of this knowledge for meal planning. Follow up email sent as well. Written material given at graduation. Flowsheet Row Pulmonary Rehab from 07/13/2022 in [facility] Card[facility] Pulmonary Rehab  Education need identified 06/14/22       Biometrics:  Pre Biometrics - 06/14/22 1346       Pre Biometrics   Height 5\' 3"  (1.6 m)    Weight 126 lb 11.2 oz (57.5 kg)    Waist Circumference 32.5 inches    Hip Circumference 37 inches     Waist to Hip Ratio 0.88 %    BMI (Calculated) 22.45    Single Leg Stand 6.81 seconds              Nutrition Therapy Plan and Nutrition Goals:  Nutrition Therapy & Goals - 06/14/22 1442       Intervention Plan   Intervention Prescribe, educate and counsel regarding individualized specific dietary modifications aiming towards targeted core components such as weight, hypertension, lipid management, diabetes, heart failure and other comorbidities.    Expected Outcomes Short Term Goal: Understand basic principles of dietary content, such as calories, fat, sodium, cholesterol and nutrients.;Short Term Goal: A plan has been developed with personal nutrition goals set during dietitian appointment.;Long Term Goal: Adherence to prescribed nutrition plan.  Nutrition Assessments:  MEDIFICTS Score Key: ?70 Need to make dietary changes  40-70 Heart Healthy Diet ? 40 Therapeutic Level Cholesterol Diet  Flowsheet Row Pulmonary Rehab from 06/14/2022 in [facility] Cardiac and Pulmonary Rehab  Picture Your Plate Total Score on Admission 68      Picture Your Plate Scores: <16 Unhealthy dietary pattern with much room for improvement. 41-50 Dietary pattern unlikely to meet recommendations for good health and room for improvement. 51-60 More healthful dietary pattern, with some room for improvement.  >60 Healthy dietary pattern, although there may be some specific behaviors that could be improved.   Nutrition Goals Re-Evaluation:  Nutrition Goals Re-Evaluation     Row Name 07/04/22 1031 07/27/22 1022           Goals   Current Weight -- 126 lb (57.2 kg)      Nutrition Goal Patient is interested in meeting with the RD, we are currently in transition in hiring a new dietician and once they are on board, will meet with the patient. Patient would like to focus more on cooking at home and specifically for herself only. Eat smaller more frequent portion sizes.      Comment Radha states  she generally follows a "diabetic friendly diet" because her and her husband managed it together for him until he passed. She already focuses on smaller frequent meals. She admits she does go out and get food a lot which could consist of salads with chicken, bean salads. We talked about being mindful about sodium as restaurant food can have a higher sodium content. Now that she is living by herself, she struggles with cooking for herself only. She does freeze her leftovers to help not waste food. I did provide her a couple handouts on healthy budget eating and tips to review. She is interested in meeting with the RD, in which we are in transition with at this time. Patient was informed on why it is important to maintain a balanced diet when dealing with Respiratory issues. Explained that it takes a lot of energy to breath and when they are short of breath often they will need to have a good diet to help keep up with the calories they are expending for breathing.      Expected Outcome Short: Review paperwork Long: Continue to eat healthy pulmonary based diet Short: Choose and plan snacks accordingly to patients caloric intake to improve breathing. Long: Maintain a diet independently that meets their caloric intake to aid in daily shortness of breath.               Nutrition Goals Discharge (Final Nutrition Goals Re-Evaluation):  Nutrition Goals Re-Evaluation - 07/27/22 1022       Goals   Current Weight 126 lb (57.2 kg)    Nutrition Goal Eat smaller more frequent portion sizes.    Comment Patient was informed on why it is important to maintain a balanced diet when dealing with Respiratory issues. Explained that it takes a lot of energy to breath and when they are short of breath often they will need to have a good diet to help keep up with the calories they are expending for breathing.    Expected Outcome Short: Choose and plan snacks accordingly to patients caloric intake to improve breathing. Long:  Maintain a diet independently that meets their caloric intake to aid in daily shortness of breath.             Psychosocial: Target Goals: Acknowledge presence or  absence of significant depression and/or stress, maximize coping skills, provide positive support system. Participant is able to verbalize types and ability to use techniques and skills needed for reducing stress and depression.   Education: Stress, Anxiety, and Depression - Group verbal and visual presentation to define topics covered.  Reviews how body is impacted by stress, anxiety, and depression.  Also discusses healthy ways to reduce stress and to treat/manage anxiety and depression.  Written material given at graduation.   Education: Sleep Hygiene -Provides group verbal and written instruction about how sleep can affect your health.  Define sleep hygiene, discuss sleep cycles and impact of sleep habits. Review good sleep hygiene tips.    Initial Review & Psychosocial Screening:  Initial Psych Review & Screening - 06/07/22 1009       Initial Review   Current issues with Current Stress Concerns      Family Dynamics   Good Support System? Yes   2 daughters, neighbor     Barriers   Psychosocial barriers to participate in program There are no identifiable barriers or psychosocial needs.      Screening Interventions   Interventions Encouraged to exercise;To provide support and resources with identified psychosocial needs;Provide feedback about the scores to participant    Expected Outcomes Short Term goal: Utilizing psychosocial counselor, staff and physician to assist with identification of specific Stressors or current issues interfering with healing process. Setting desired goal for each stressor or current issue identified.;Long Term Goal: Stressors or current issues are controlled or eliminated.;Short Term goal: Identification and review with participant of any Quality of Life or Depression concerns found by scoring  the questionnaire.;Long Term goal: The participant improves quality of Life and PHQ9 Scores as seen by post scores and/or verbalization of changes             Quality of Life Scores:  Scores of 19 and below usually indicate a poorer quality of life in these areas.  A difference of  2-3 points is a clinically meaningful difference.  A difference of 2-3 points in the total score of the Quality of Life Index has been associated with significant improvement in overall quality of life, self-image, physical symptoms, and general health in studies assessing change in quality of life.  PHQ-9: Review Flowsheet  More data exists      06/14/2022 12/19/2021 05/31/2016 05/28/2015 05/20/2014  Depression screen PHQ 2/9  Decreased Interest 0 0 0 0 0 0 0  Down, Depressed, Hopeless 0 0 0 0 0 0 0  PHQ - 2 Score 0 0 0 0 0 0 0  Altered sleeping 0 - - - -  Tired, decreased energy 1 - - - -  Change in appetite 1 - - - -  Feeling bad or failure about yourself  0 - - - -  Trouble concentrating 0 - - - -  Moving slowly or fidgety/restless 1 - - - -  Suicidal thoughts 0 - - - -  PHQ-9 Score 3 - - - -  Difficult doing work/chores Not difficult at all - - - -   Interpretation of Total Score  Total Score Depression Severity:  1-4 = Minimal depression, 5-9 = Mild depression, 10-14 = Moderate depression, 15-19 = Moderately severe depression, 20-27 = Severe depression   Psychosocial Evaluation and Intervention:  Psychosocial Evaluation - 06/07/22 1036       Psychosocial Evaluation & Interventions   Interventions Encouraged to exercise with the program and follow exercise prescription  Comments Jd is coming to pulmonary rehab with the diagnosis of pulmonary HTN. She has been managing her heart failure symptoms well. She has noticed her breathing has gotten better with fluid managment. She does report her biggest stressor coming to the program is "white coat syndrome," staff reassured her that we will take  it day by day and keep that in mind with her blood pressure readings. She has a very supportive family and neighbor who are there if she needs anything. She lives alone and does most of her own care and housekeeping.    Expected Outcomes Short: attend pulmonary rehab for education and exercise. Long: develop and maintain positive self care habits.    Continue Psychosocial Services  Follow up required by staff             Psychosocial Re-Evaluation:  Psychosocial Re-Evaluation     Row Name 07/04/22 1052 07/27/22 1020           Psychosocial Re-Evaluation   Current issues with Current Sleep Concerns None Identified;Current Psychotropic Meds      Comments Amarachi is doing well mentally. She does have issues with sleep as she experiences bladder issues that causes her to get up multiple times during the night to use the bathroom. Her and her doctor talked about it and they are not pursing anything at this time. She knows to speak up if it becomes a bigger issue. She stays compliant using her oxygen at night. Her doctor encouraged her to take naps during the day if that helps. Patient takes zoloft which helps. She has good support from her 2 of her daughters. She has been enjoying the program thus far and denies other concerns at this time. Patient reports no issues with their current mental states, sleep, stress, depression or anxiety. Will follow up with patient in a few weeks for any changes.      Expected Outcomes Short: Continue rehab attendance for mood boost Long: Continue to maintain a positive attitude Short: Continue to exercise regularly to support mental health and notify staff of any changes. Long: maintain mental health and well being through teaching of rehab or prescribed medications independently.      Interventions Encouraged to attend Pulmonary Rehabilitation for the exercise Encouraged to attend Pulmonary Rehabilitation for the exercise      Continue Psychosocial Services   Follow up required by staff Follow up required by staff               Psychosocial Discharge (Final Psychosocial Re-Evaluation):  Psychosocial Re-Evaluation - 07/27/22 1020       Psychosocial Re-Evaluation   Current issues with None Identified;Current Psychotropic Meds    Comments Patient reports no issues with their current mental states, sleep, stress, depression or anxiety. Will follow up with patient in a few weeks for any changes.    Expected Outcomes Short: Continue to exercise regularly to support mental health and notify staff of any changes. Long: maintain mental health and well being through teaching of rehab or prescribed medications independently.    Interventions Encouraged to attend Pulmonary Rehabilitation for the exercise    Continue Psychosocial Services  Follow up required by staff             Education: Education Goals: Education classes will be provided on a weekly basis, covering required topics. Participant will state understanding/return demonstration of topics presented.  Learning Barriers/Preferences:  Learning Barriers/Preferences - 06/07/22 1006       Learning Barriers/Preferences   Learning  Barriers Sight    Learning Preferences None             General Pulmonary Education Topics:  Infection Prevention: - Provides verbal and written material to individual with discussion of infection control including proper hand washing and proper equipment cleaning during exercise session. Flowsheet Row Pulmonary Rehab from 07/13/2022 in [facility] Cardiac and Pulmonary Rehab  Date 06/14/22  Educator [facility]  Instruction Review Code 1- Verbalizes Understanding       Falls Prevention: - Provides verbal and written material to individual with discussion of falls prevention and safety. Flowsheet Row Pulmonary Rehab from 07/13/2022 in [facility] Cardiac and Pulmonary Rehab  Date 06/14/22  Educator [facility]  Instruction Review Code 1- Verbalizes Understanding        Chronic Lung Disease Review: - Group verbal instruction with posters, models, PowerPoint presentations and videos,  to review new updates, new respiratory medications, new advancements in procedures and treatments. Providing information on websites and "800" numbers for continued self-education. Includes information about supplement oxygen, available portable oxygen systems, continuous and intermittent flow rates, oxygen safety, concentrators, and Medicare reimbursement for oxygen. Explanation of Pulmonary Drugs, including class, frequency, complications, importance of spacers, rinsing mouth after steroid MDI's, and proper cleaning methods for nebulizers. Review of basic lung anatomy and physiology related to function, structure, and complications of lung disease. Review of risk factors. Discussion about methods for diagnosing sleep apnea and types of masks and machines for OSA. Includes a review of the use of types of environmental controls: home humidity, furnaces, filters, dust mite/pet prevention, HEPA vacuums. Discussion about weather changes, air quality and the benefits of nasal washing. Instruction on Warning signs, infection symptoms, calling MD promptly, preventive modes, and value of vaccinations. Review of effective airway clearance, coughing and/or vibration techniques. Emphasizing that all should Create an Action Plan. Written material given at graduation. Flowsheet Row Pulmonary Rehab from 07/13/2022 in [facility] Cardiac and Pulmonary Rehab  Education need identified 06/14/22  Date 07/13/22  Educator [facility]  Instruction Review Code 1- Verbalizes Understanding       AED/CPR: - Group verbal and written instruction with the use of models to demonstrate the basic use of the AED with the basic ABC's of resuscitation.    Anatomy and Cardiac Procedures: - Group verbal and visual presentation and models provide information about basic cardiac anatomy and function. Reviews the testing methods done  to diagnose heart disease and the outcomes of the test results. Describes the treatment choices: Medical Management, Angioplasty, or Coronary Bypass Surgery for treating various heart conditions including Myocardial Infarction, Angina, Valve Disease, and Cardiac Arrhythmias.  Written material given at graduation.   Medication Safety: - Group verbal and visual instruction to review commonly prescribed medications for heart and lung disease. Reviews the medication, class of the drug, and side effects. Includes the steps to properly store meds and maintain the prescription regimen.  Written material given at graduation.   Other: -Provides group and verbal instruction on various topics (see comments)   Knowledge Questionnaire Score:  Knowledge Questionnaire Score - 06/14/22 1440       Knowledge Questionnaire Score   Pre Score 8/18              Core Components/Risk Factors/Patient Goals at Admission:  Personal Goals and Risk Factors at Admission - 06/14/22 1442       Core Components/Risk Factors/Patient Goals on Admission    Weight Management Yes    Intervention Weight Management: Develop a combined nutrition and exercise program designed  to reach desired caloric intake, while maintaining appropriate intake of nutrient and fiber, sodium and fats, and appropriate energy expenditure required for the weight goal.;Weight Management: Provide education and appropriate resources to help participant work on and attain dietary goals.    Admit Weight 126 lb 11.2 oz (57.5 kg)    Goal Weight: Short Term 127 lb (57.6 kg)    Goal Weight: Long Term 127 lb (57.6 kg)    Expected Outcomes Short Term: Continue to assess and modify interventions until short term weight is achieved;Long Term: Adherence to nutrition and physical activity/exercise program aimed toward attainment of established weight goal;Weight Maintenance: Understanding of the daily nutrition guidelines, which includes 25-35% calories from  fat, 7% or less cal from saturated fats, less than 200mg  cholesterol, less than 1.5gm of sodium, & 5 or more servings of fruits and vegetables daily;Understanding recommendations for meals to include 15-35% energy as protein, 25-35% energy from fat, 35-60% energy from carbohydrates, less than 200mg  of dietary cholesterol, 20-35 gm of total fiber daily;Understanding of distribution of calorie intake throughout the day with the consumption of 4-5 meals/snacks    Heart Failure Yes    Intervention Provide a combined exercise and nutrition program that is supplemented with education, support and counseling about heart failure. Directed toward relieving symptoms such as shortness of breath, decreased exercise tolerance, and extremity edema.    Expected Outcomes Improve functional capacity of life;Short term: Attendance in program 2-3 days a week with increased exercise capacity. Reported lower sodium intake. Reported increased fruit and vegetable intake. Reports medication compliance.;Short term: Daily weights obtained and reported for increase. Utilizing diuretic protocols set by physician.;Long term: Adoption of self-care skills and reduction of barriers for early signs and symptoms recognition and intervention leading to self-care maintenance.    Hypertension Yes    Intervention Provide education on lifestyle modifcations including regular physical activity/exercise, weight management, moderate sodium restriction and increased consumption of fresh fruit, vegetables, and low fat dairy, alcohol moderation, and smoking cessation.;Monitor prescription use compliance.    Expected Outcomes Short Term: Continued assessment and intervention until BP is < 140/14mm HG in hypertensive participants. < 130/38mm HG in hypertensive participants with diabetes, heart failure or chronic kidney disease.;Long Term: Maintenance of blood pressure at goal levels.    Lipids Yes    Intervention Provide education and support for  participant on nutrition & aerobic/resistive exercise along with prescribed medications to achieve LDL 70mg , HDL >40mg .    Expected Outcomes Short Term: Participant states understanding of desired cholesterol values and is compliant with medications prescribed. Participant is following exercise prescription and nutrition guidelines.;Long Term: Cholesterol controlled with medications as prescribed, with individualized exercise RX and with personalized nutrition plan. Value goals: LDL < 70mg , HDL > 40 mg.             Education:Diabetes - Individual verbal and written instruction to review signs/symptoms of diabetes, desired ranges of glucose level fasting, after meals and with exercise. Acknowledge that pre and post exercise glucose checks will be done for 3 sessions at entry of program.   Know Your Numbers and Heart Failure: - Group verbal and visual instruction to discuss disease risk factors for cardiac and pulmonary disease and treatment options.  Reviews associated critical values for Overweight/Obesity, Hypertension, Cholesterol, and Diabetes.  Discusses basics of heart failure: signs/symptoms and treatments.  Introduces Heart Failure Zone chart for action plan for heart failure.  Written material given at graduation.   Core Components/Risk Factors/Patient Goals Review:   Goals and  Risk Factor Review     Row Name 07/04/22 1030 07/04/22 1031 07/27/22 1021         Core Components/Risk Factors/Patient Goals Review   Personal Goals Review Hypertension;Heart Failure;Weight Management/Obesity -- Improve shortness of breath with ADL's     Review Salice is checking her BP at home and keeps a log of it at home. She usually runs 120-130s/60-70s. She is stable at rehab as well. She also watches her weight at home and is looking to maintain at this time, she usually ranges between 122-124 lb.  she is aware to be mindful of any sudden weight gain that could be potentially causes from fluid. She  denies any heart failure symotoms. She is staying compliant taking all of her medications. -- Spoke to patient about their shortness of breath and what they can do to improve. Patient has been informed of breathing techniques when starting the program. Patient is informed to tell staff if they have had any med changes and that certain meds they are taking or not taking can be causing shortness of breath.     Expected Outcomes Short: Continue to watch weight, look for any abnormal changes Long: Continue to manage lifestyle risk factors -- Short: Attend [facility] regularly to improve shortness of breath with ADL's. Long: maintain independence with ADL's              Core Components/Risk Factors/Patient Goals at Discharge (Final Review):   Goals and Risk Factor Review - 07/27/22 1021       Core Components/Risk Factors/Patient Goals Review   Personal Goals Review Improve shortness of breath with ADL's    Review Spoke to patient about their shortness of breath and what they can do to improve. Patient has been informed of breathing techniques when starting the program. Patient is informed to tell staff if they have had any med changes and that certain meds they are taking or not taking can be causing shortness of breath.    Expected Outcomes Short: Attend [facility] regularly to improve shortness of breath with ADL's. Long: maintain independence with ADL's             ITP Comments:  ITP Comments     Row Name 06/07/22 1048 06/14/22 1321 06/20/22 1001 07/12/22 0942 08/09/22 1134   ITP Comments Initial phone call completed. Diagnosis can be found in [facility] 4/3. EP Orientation scheduled for Wednesday 4/17 at 10:30. Completed and gym orientation. Initial ITP created and sent for review to Dr. Jinny Sanders, Medical Director. First full day of exercise!  Patient was oriented to gym and equipment including functions, settings, policies, and procedures.  Patient's individual exercise prescription and  treatment plan were reviewed.  All starting workloads were established based on the results of the 6 minute walk test done at initial orientation visit.  The plan for exercise progression was also introduced and progression will be customized based on patient's performance and goals. 30 Day review completed. Medical Director ITP review done, changes made as directed, and signed approval by Medical Director.    new to program 30 Day review completed. Medical Director ITP review done, changes made as directed, and signed approval by Medical Director.            Comments:

## 2022-08-10 ENCOUNTER — Encounter: Payer: Medicare Other | Admitting: *Deleted

## 2022-08-10 DIAGNOSIS — J3081 Allergic rhinitis due to animal (cat) (dog) hair and dander: Secondary | ICD-10-CM | POA: Diagnosis not present

## 2022-08-10 DIAGNOSIS — J3089 Other allergic rhinitis: Secondary | ICD-10-CM | POA: Diagnosis not present

## 2022-08-10 DIAGNOSIS — I272 Pulmonary hypertension, unspecified: Secondary | ICD-10-CM | POA: Diagnosis not present

## 2022-08-10 DIAGNOSIS — J301 Allergic rhinitis due to pollen: Secondary | ICD-10-CM | POA: Diagnosis not present

## 2022-08-10 NOTE — Progress Notes (Signed)
Daily Session Note  Patient Details  Name: [name] MRN: [id number] Date of Birth: March 14, 1940 Referring Provider:   Flowsheet Row Pulmonary Rehab from 06/14/2022 in [facility] Cardiac and Pulmonary Rehab  Referring Provider Bensimhon       Encounter Date: 08/10/2022  Check In:  Session Check In - 08/10/22 1037       Check-In   Supervising physician immediately available to respond to emergencies See telemetry face sheet for immediately available ER MD    Location [facility]-Cardiac & Pulmonary Rehab    Staff Present Cora Collum, RN, BSN, CCRP;Jessica [place], MA, RCEP, CCRP, CCET;Joseph Echo, RCP,RRT,BSRT;Noah Leona, [place], Exercise Physiologist    Virtual Visit No    Medication changes reported     No    Fall or balance concerns reported    No    Warm-up and Cool-down Performed on first and last piece of equipment    Resistance Training Performed Yes    VAD Patient? No    PAD/SET Patient? No      Pain Assessment   Currently in Pain? No/denies                Social History   Tobacco Use  Smoking Status Never   Passive exposure: Past  Smokeless Tobacco Never    Goals Met:  Proper associated with RPD/PD & O2 Sat Independence with exercise equipment Exercise tolerated well No report of concerns or symptoms today  Goals Unmet:  Not Applicable  Comments: Pt able to follow exercise prescription today without complaint.  Will continue to monitor for progression.    Dr. Bethann Punches is Medical Director for [facility] Cardiac Rehabilitation.  Dr. Vida Rigger is Medical Director for [facility] Pulmonary Rehabilitation.

## 2022-08-15 DIAGNOSIS — I272 Pulmonary hypertension, unspecified: Secondary | ICD-10-CM

## 2022-08-15 DIAGNOSIS — J301 Allergic rhinitis due to pollen: Secondary | ICD-10-CM | POA: Diagnosis not present

## 2022-08-15 DIAGNOSIS — J3089 Other allergic rhinitis: Secondary | ICD-10-CM | POA: Diagnosis not present

## 2022-08-15 DIAGNOSIS — J3081 Allergic rhinitis due to animal (cat) (dog) hair and dander: Secondary | ICD-10-CM | POA: Diagnosis not present

## 2022-08-15 NOTE — Progress Notes (Signed)
Daily Session Note  Patient Details  Name: Tylin Sagert MRN: [id number] Date of Birth: 1941-02-09 Referring Provider:   Flowsheet Row Pulmonary Rehab from 06/14/2022 in [facility] Cardiac and Pulmonary Rehab  Referring Provider Bensimhon       Encounter Date: 08/15/2022  Check In:  Session Check In - 08/15/22 0948       Check-In   Supervising physician immediately available to respond to emergencies See telemetry face sheet for immediately available ER MD    Location [facility]-Cardiac & Pulmonary Rehab    Staff Present Darcel Bayley, RN,BC,MSN;Susanne Bice, RN, BSN, CCRP    Virtual Visit No    Medication changes reported     No    Fall or balance concerns reported    No    Tobacco Cessation No Change    Warm-up and Cool-down Performed on first and last piece of equipment    Resistance Training Performed Yes    VAD Patient? No    PAD/SET Patient? No      Pain Assessment   Currently in Pain? No/denies    Multiple Pain Sites No                Social History   Tobacco Use  Smoking Status Never   Passive exposure: Past  Smokeless Tobacco Never    Goals Met:  Independence with exercise equipment Exercise tolerated well No report of concerns or symptoms today  Goals Unmet:  Not Applicable  Comments: Pt able to follow exercise prescription today without complaint.  Will continue to monitor for progression.    Dr. Bethann Punches is Medical Director for [facility] Cardiac Rehabilitation.  Dr. Vida Rigger is Medical Director for [facility] Pulmonary Rehabilitation.

## 2022-08-17 ENCOUNTER — Ambulatory Visit: Payer: Medicare Other | Attending: Nurse Practitioner | Admitting: Cardiology

## 2022-08-17 ENCOUNTER — Encounter: Payer: Self-pay | Admitting: Cardiology

## 2022-08-17 ENCOUNTER — Encounter: Payer: Medicare Other | Admitting: *Deleted

## 2022-08-17 VITALS — BP 154/66 | HR 69 | Ht 63.0 in | Wt 126.2 lb

## 2022-08-17 DIAGNOSIS — I5032 Chronic diastolic (congestive) heart failure: Secondary | ICD-10-CM | POA: Diagnosis not present

## 2022-08-17 DIAGNOSIS — I340 Nonrheumatic mitral (valve) insufficiency: Secondary | ICD-10-CM

## 2022-08-17 DIAGNOSIS — I272 Pulmonary hypertension, unspecified: Secondary | ICD-10-CM | POA: Diagnosis not present

## 2022-08-17 DIAGNOSIS — I10 Essential (primary) hypertension: Secondary | ICD-10-CM | POA: Diagnosis not present

## 2022-08-17 DIAGNOSIS — I480 Paroxysmal atrial fibrillation: Secondary | ICD-10-CM | POA: Diagnosis not present

## 2022-08-17 MED ORDER — POTASSIUM CHLORIDE CRYS ER 20 MEQ PO TBCR: 20.0000 meq | EXTENDED_RELEASE_TABLET | Freq: Every day | ORAL | 3 refills | Status: DC

## 2022-08-17 NOTE — Progress Notes (Signed)
Daily Session Note  Patient Details  Name: [name] MRN: [id number] Date of Birth: [birth year] Referring Provider:   Flowsheet Row Pulmonary Rehab from 06/14/2022 in [facility] Cardiac and Pulmonary Rehab  Referring Provider Bensimhon       Encounter Date: 08/17/2022  Check In:  Session Check In - 08/17/22 1007       Check-In   Supervising physician immediately available to respond to emergencies See telemetry face sheet for immediately available ER MD    Location [facility]-Cardiac & Pulmonary Rehab    Staff Present Lanny Hurst, RN, ADN;Susanne Bice, RN, BSN, CCRP;Other   [place] Bigelow, MS   Virtual Visit No    Medication changes reported     No    Fall or balance concerns reported    No    Warm-up and Cool-down Performed on first and last piece of equipment    Resistance Training Performed Yes    VAD Patient? No    PAD/SET Patient? No      Pain Assessment   Currently in Pain? No/denies                Social History   Tobacco Use  Smoking Status Never   Passive exposure: Past  Smokeless Tobacco Never    Goals Met:  Independence with exercise equipment Exercise tolerated well No report of concerns or symptoms today Strength training completed today  Goals Unmet:  Not Applicable  Comments: Pt able to follow exercise prescription today without complaint.  Will continue to monitor for progression.    Dr. Bethann Punches is Medical Director for [facility] [place] Cardiac Rehabilitation.  Dr. Vida Rigger is Medical Director for [facility] Pulmonary Rehabilitation.

## 2022-08-17 NOTE — Progress Notes (Signed)
Cardiology Office Note:  .   Date:  08/28/2022  ID:  Audrienne Kimpton, DOB [birth year], MRN [id number] PCP: Karie Schwalbe, MD  [facility] Providers Cardiologist:  None    History of Present Illness: .   Armanii Fietz is a 82 y.o. female with a past medical history of chronic atrial fibrillation, hypertension, diastolic heart failure, moderate MR, PAD, pulmonary hypertension, who is here today for follow-up.  She previously been followed by [facility] clinic with previous echoes with normal systolic function and severe MR.  Care was transferred to Dr. Mariah Milling in 2023.  01/16/2022 she developed worsening shortness of breath.  Primary care provider felt that she had heart failure chest x-ray revealed pulmonary edema.  She followed up with pulmonary Dr. Jayme Cloud in 01/2022 with sats in the 80s.  Did have anaphylaxis at 75% and started on nighttime oxygen Lasix and potassium replacements with a BNP of 501.  She then followed up with Dr. Ailene Rud 01/31/2022 at 03/18/2022 Lasix was increased to 20 mg twice daily and Lopressor was decreased.  Echocardiogram revealed an LVEF of 60 to 65%, LA was moderately dilated, moderate MR, G3 DD, RV was normal, and RVSP of 63 mmHg.  Referred to heart failure clinic where she had resolution CT that was ordered in 2/24 which revealed no ILD, air trapping with small airway disease plus lung nodules and CAC.  Repeat echocardiogram 5/24 revealed an LVEF of 55-60% with G2 DD, RV was normal, RVSP 44.  Sleep study in 06/2022 AHI 3.7.  Unfortunately had to stop Jardiance due to rash on her face and neck.  She tried [place] and had GI symptoms early April and had to stop the medication she was back on a small period of time and was tolerating at that time.  She is also continued with pulmonary rehab.  She was last seen by advanced heart failure clinic Dr. Gala Romney 07/31/2022.  That time she did continue with pulmonary rehab and was feeling well keeping fluid.  Stated that she  did not do any of the things that she wants to was going to the store and able to complete her housework.  She had no complaints of edema, orthopnea, or PND.  She returns clinic today accompanied by her daughters.  She states that overall she has been doing well.  Her weight has been stable.  She did bring in her blood pressure log with her from home as she states that she has whitecoat syndrome when it comes to her blood pressure.  Blood pressures have been maintaining 120-130 systolic even in pulmonary rehab on chart review.  She denies any chest pain, worsening shortness of breath, peripheral edema, orthopnea, or PND.  She is compliant with her medication regimen and does not require refills today.  She has not noted any bleeding or blood in her urine or stool and has been continued on apixaban without incident.  She denies any hospitalizations or visits to the emergency department  ROS: Point review of systems has been completed and considered negative with exception of what is listed in the HPI  Studies Reviewed: Marland Kitchen      EKG: Normal sinus rhythm with rate of 69, left axis deviation, LVH, no acute changes from prior studies  TTE 07/03/22  1. Left ventricular ejection fraction, by estimation, is 55 to 60%. The  left ventricle has normal function. The left ventricle has no regional  wall motion abnormalities. There is mild left ventricular hypertrophy.  Left ventricular diastolic parameters  are consistent with Grade II diastolic dysfunction (pseudonormalization).   2. Right ventricular systolic function is normal. The right ventricular  size is normal. There is mildly elevated pulmonary artery systolic  pressure.   3. Left atrial size was moderately dilated.   4. Right atrial size was mildly dilated.   5. The mitral valve is normal in structure. Moderate to severe mitral  valve regurgitation. No evidence of mitral stenosis.   6. Tricuspid valve regurgitation is moderate.   7. The aortic valve  is normal in structure. Aortic valve regurgitation is  mild. Mild aortic valve stenosis. Aortic valve area, by VTI measures 2.00  cm. Aortic valve mean gradient measures 9.0 mmHg.   8. Aortic dilatation noted. There is borderline dilatation of the  ascending aorta, measuring 38 mm.   9. Mildly dilated pulmonary artery.  10. The inferior vena cava is normal in size with greater than 50%  respiratory variability, suggesting right atrial pressure of 3 mmHg.  Risk Assessment/Calculations:    CHA2DS2-VASc Score = 6   This indicates a 9.7% annual risk of stroke. The patient's score is based upon: CHF History: 1 HTN History: 1 Diabetes History: 0 Stroke History: 0 Vascular Disease History: 1 Age Score: 2 Gender Score: 1         Physical Exam:   VS:  BP (!) 154/66 (BP Location: Left Arm, Patient Position: Sitting, Cuff Size: Normal)   Pulse 69   Ht 5\' 3"  (1.6 m)   Wt 126 lb 3.2 oz (57.2 kg)   SpO2 99%   BMI 22.36 kg/m    Wt Readings from Last 3 Encounters:  08/17/22 126 lb 3.2 oz (57.2 kg)  07/31/22 125 lb (56.7 kg)  06/14/22 126 lb 11.2 oz (57.5 kg)    GEN: Well nourished, well developed in no acute distress NECK: No JVD; No carotid bruits CARDIAC: RRR, II/VI systolic murmur, without rubs or gallops RESPIRATORY:  Clear to auscultation without rales, wheezing or rhonchi  ABDOMEN: Soft, non-tender, non-distended EXTREMITIES:  No edema; No deformity   ASSESSMENT AND PLAN: .   Paroxysmal atrial fibrillation where she is maintaining normal sinus rhythm today.  She is continued on amiodarone 100 mg daily and apixaban 2.5 mg twice daily for CHA2DS2-VASc score 6 for stroke prophylaxis.  There was questioning with underlying lung disease about switching her off of amiodarone to questionable flecainide.  With her chest CT revealing no interstitial lung disease with her being asymptomatic at this time she has been maintained on amiodarone.  Previously had been taking metoprolol with dose  reduction and discontinuation due to bradycardia.  She has a follow-up appointment that is upcoming with her primary cardiologist who can rediscuss this with her at that time.  Diastolic heart failure with a last LVEF 55 to 60%, no regional wall motion abnormalities, G2 DD, moderate to severe failure mitral valve regurgitation, moderate tricuspid regurgitation, mild aortic stenosis.  Her weights have remained stable.  She does not appear to be volume today.  She is continued on Lasix 20 mg daily, Farxiga 10 mg daily, she is encouraged to continue to her weights at home.  Functional capacity is improved with the diuresis and NYHA I-II symptoms.  She is encouraged to continue with follow-ups with advanced heart failure.  Hypertension with blood pressure today 168/56.  She did provide a log from home which revealed blood pressures of 120s 130s systolic.  Classic whitecoat hypertension.  She is encouraged to continue to monitor pressures at home.  She is continued on Norvasc 5 mg daily, furosemide 20 mg daily, and valsartan 160 mg daily.  Mitral valve disorder details severe back in 2020, 2022, and again in 2024.  This is also associated with tricuspid valve regurgitation.  Continue to monitor with surveillance studies.  Pulmonary hypertension with the mainstay of therapy with volume control and blood pressure management.  High-resolution CT in 04/2022 revealed no interstitial lung disease.  Small airway disease positive with lung nodules positive CAC.  Echocardiogram in 5/24 revealed LVEF 55 to 60%, G2 DD, RV is normal with RVSP 44.  Sleep study 06/2022 AHI 3.7.  His symptoms have improved with diureses.  She is encouraged to continue to follow-up with pulmonary Dr. Jayme Cloud as well.       Dispo: Patient return to clinic to see a primary cardiologist Dr. Mariah Milling in August 2024 or sooner if needed.  Signed, Neftali Abair, NP

## 2022-08-17 NOTE — Patient Instructions (Addendum)
Medication Instructions:  Your physician has recommended you make the following change in your medication:   TAKE Potassium 20 mEq once daily    *If you need a refill on your cardiac medications before your next appointment, please call your pharmacy*   Lab Work: None  If you have labs (blood work) drawn today and your tests are completely normal, you will receive your results only by: MyChart Message (if you have MyChart) OR A paper copy in the mail If you have any lab test that is abnormal or we need to change your treatment, we will call you to review the results.   Testing/Procedures: None   Follow-Up: At [facility], you and your health needs are our priority.  As part of our continuing mission to provide you with exceptional heart care, we have created designated Provider Care Teams.  These Care Teams include your primary Cardiologist (physician) and Advanced Practice Providers (APPs -  Physician Assistants and Nurse Practitioners) who all work together to provide you with the care you need, when you need it.     Your next appointment:   Keep scheduled appointment with Dr. Mariah Milling on 10/16/22 at 4:00 pm  Provider:   Julien Nordmann, MD

## 2022-08-18 ENCOUNTER — Ambulatory Visit: Payer: Medicare Other | Admitting: Nurse Practitioner

## 2022-08-22 ENCOUNTER — Encounter: Payer: Medicare Other | Admitting: *Deleted

## 2022-08-22 DIAGNOSIS — J3081 Allergic rhinitis due to animal (cat) (dog) hair and dander: Secondary | ICD-10-CM | POA: Diagnosis not present

## 2022-08-22 DIAGNOSIS — J3089 Other allergic rhinitis: Secondary | ICD-10-CM | POA: Diagnosis not present

## 2022-08-22 DIAGNOSIS — I272 Pulmonary hypertension, unspecified: Secondary | ICD-10-CM | POA: Diagnosis not present

## 2022-08-22 DIAGNOSIS — J301 Allergic rhinitis due to pollen: Secondary | ICD-10-CM | POA: Diagnosis not present

## 2022-08-22 NOTE — Progress Notes (Signed)
Daily Session Note  Patient Details  Name: [name] MRN: [id number] Date of Birth: [birth year] Referring Provider:   Flowsheet Row Pulmonary Rehab from 06/14/2022 in [facility] Cardiac and Pulmonary Rehab  Referring Provider Bensimhon       Encounter Date: 08/22/2022  Check In:  Session Check In - 08/22/22 1029       Check-In   Supervising physician immediately available to respond to emergencies See telemetry face sheet for immediately available ER MD    Location [facility]-Cardiac & Pulmonary Rehab    Staff Present Susann Givens, RN BSN;Susanne Bice, RN, BSN, Tyrone Apple RN, BSN    Virtual Visit No    Medication changes reported     No    Fall or balance concerns reported    No    Warm-up and Cool-down Performed on first and last piece of Public house manager Performed Yes    VAD Patient? No    PAD/SET Patient? No      Pain Assessment   Currently in Pain? No/denies                Social History   Tobacco Use  Smoking Status Never   Passive exposure: Past  Smokeless Tobacco Never    Goals Met:  Independence with exercise equipment Exercise tolerated well No report of concerns or symptoms today Strength training completed today  Goals Unmet:  Not Applicable  Comments: Pt able to follow exercise prescription today without complaint.  Will continue to monitor for progression.    Dr. Bethann Punches is Medical Director for [facility] Cardiac Rehabilitation.  Dr. Vida Rigger is Medical Director for [facility] Pulmonary Rehabilitation.

## 2022-08-24 ENCOUNTER — Encounter: Payer: Medicare Other | Admitting: *Deleted

## 2022-08-24 DIAGNOSIS — I272 Pulmonary hypertension, unspecified: Secondary | ICD-10-CM | POA: Diagnosis not present

## 2022-08-24 NOTE — Progress Notes (Signed)
Daily Session Note  Patient Details  Name: [name] MRN: [id number] Date of Birth: 1940/04/17 Referring Provider:   Flowsheet Row Pulmonary Rehab from 06/14/2022 in [facility] Cardiac and Pulmonary Rehab  Referring Provider Bensimhon       Encounter Date: 08/24/2022  Check In:  Session Check In - 08/24/22 1005       Check-In   Supervising physician immediately available to respond to emergencies See telemetry face sheet for immediately available ER MD    Location [facility]-Cardiac & Pulmonary Rehab    Staff Present Lanny Hurst, RN, Bobetta Lime, RCP,RRT,BSRT;Other;Meredith Jewel Baize, RN BSN   [place]el[place], MS   Virtual Visit No    Medication changes reported     No    Fall or balance concerns reported    No    Warm-up and Cool-down Performed on first and last piece of equipment    Resistance Training Performed Yes    VAD Patient? No    PAD/SET Patient? No      Pain Assessment   Currently in Pain? No/denies                Social History   Tobacco Use  Smoking Status Never   Passive exposure: Past  Smokeless Tobacco Never    Goals Met:  Independence with exercise equipment Exercise tolerated well No report of concerns or symptoms today Strength training completed today  Goals Unmet:  Not Applicable  Comments: Pt able to follow exercise prescription today without complaint.  Will continue to monitor for progression.    Dr. Bethann Punches is Medical Director for [facility] Cardiac Rehabilitation.  Dr. Vida Rigger is Medical Director for [facility] Pulmonary Rehabilitation.

## 2022-08-28 ENCOUNTER — Other Ambulatory Visit: Payer: Self-pay | Admitting: Cardiovascular Disease

## 2022-08-29 ENCOUNTER — Encounter: Payer: Medicare Other | Attending: Internal Medicine | Admitting: *Deleted

## 2022-08-29 DIAGNOSIS — I272 Pulmonary hypertension, unspecified: Secondary | ICD-10-CM | POA: Insufficient documentation

## 2022-08-29 NOTE — Progress Notes (Signed)
Daily Session Note  Patient Details  Name: [name] MRN: [id number] Date of Birth: 11/20/1940 Referring Provider:   Flowsheet Row Pulmonary Rehab from 06/14/2022 in [facility] Cardiac and Pulmonary Rehab  Referring Provider Bensimhon       Encounter Date: 08/29/2022  Check In:  Session Check In - 08/29/22 0956       Check-In   Supervising physician immediately available to respond to emergencies See telemetry face sheet for immediately available ER MD    Location [facility]-Cardiac & Pulmonary Rehab    Staff Present Lanny Hurst, RN, ADN;Krista Karleen Hampshire RN, BSN;Meredith Jewel Baize, RN BSN;Other   [place], MS   Virtual Visit No    Medication changes reported     No    Fall or balance concerns reported    No    Warm-up and Cool-down Performed on first and last piece of equipment    Resistance Training Performed Yes    VAD Patient? No    PAD/SET Patient? No      Pain Assessment   Currently in Pain? No/denies                Social History   Tobacco Use  Smoking Status Never   Passive exposure: Past  Smokeless Tobacco Never    Goals Met:  Independence with exercise equipment Exercise tolerated well No report of concerns or symptoms today Strength training completed today  Goals Unmet:  Not Applicable  Comments: Pt able to follow exercise prescription today without complaint.  Will continue to monitor for progression.    Dr. Bethann Punches is Medical Director for [facility] Cardiac Rehabilitation.  Dr. Vida Rigger is Medical Director for [facility] Pulmonary Rehabilitation.

## 2022-08-30 DIAGNOSIS — H26492 Other secondary cataract, left eye: Secondary | ICD-10-CM | POA: Diagnosis not present

## 2022-08-30 DIAGNOSIS — H35371 Puckering of macula, right eye: Secondary | ICD-10-CM | POA: Diagnosis not present

## 2022-09-05 ENCOUNTER — Encounter: Payer: Self-pay | Admitting: *Deleted

## 2022-09-05 ENCOUNTER — Encounter: Payer: Medicare Other | Admitting: *Deleted

## 2022-09-05 DIAGNOSIS — I272 Pulmonary hypertension, unspecified: Secondary | ICD-10-CM

## 2022-09-05 NOTE — Progress Notes (Signed)
Daily Session Note  Patient Details  Name: [name] MRN: [id number] Date of Birth: Jan 21, 1941 Referring Provider:   Flowsheet Row Pulmonary Rehab from 06/14/2022 in [facility] Cardiac and Pulmonary Rehab  Referring Provider Bensimhon       Encounter Date: 09/05/2022  Check In:  Session Check In - 09/05/22 1029       Check-In   Supervising physician immediately available to respond to emergencies See telemetry face sheet for immediately available ER MD    Location [facility]-Cardiac & Pulmonary Rehab    Staff Present Cora Collum, RN, BSN, CCRP;Meredith Jewel Baize, RN Mabeline Caras, BS, ACSM CEP, Exercise Physiologist    Virtual Visit No    Medication changes reported     No    Fall or balance concerns reported    No    Warm-up and Cool-down Performed on first and last piece of equipment    Resistance Training Performed Yes    VAD Patient? No    PAD/SET Patient? No      Pain Assessment   Currently in Pain? No/denies                Social History   Tobacco Use  Smoking Status Never   Passive exposure: Past  Smokeless Tobacco Never    Goals Met:  Proper associated with RPD/PD & O2 Sat Independence with exercise equipment Exercise tolerated well No report of concerns or symptoms today  Goals Unmet:  Not Applicable  Comments: Pt able to follow exercise prescription today without complaint.  Will continue to monitor for progression.    Dr. Bethann Punches is Medical Director for [facility] Cardiac Rehabilitation.  Dr. Vida Rigger is Medical Director for [facility] Pulmonary Rehabilitation.

## 2022-09-05 NOTE — Progress Notes (Signed)
Pulmonary Individual Treatment Plan  Patient Details  Name: [name] MRN: [id number] Date of Birth: 1940-03-22 Referring Provider:   Flowsheet Row Pulmonary Rehab from 06/14/2022 in [facility] Cardiac and Pulmonary Rehab  Referring Provider Bensimhon       Initial Encounter Date:  Flowsheet Row Pulmonary Rehab from 06/14/2022 in [facility] Cardiac and Pulmonary Rehab  Date 06/14/22       Visit Diagnosis: Pulmonary HTN (HCC)  Patient's Home Medications on Admission:  Current Outpatient Medications:    albuterol (VENTOLIN HFA) 108 (90 Base) MCG/ACT inhaler, Inhale 1-2 puffs into the lungs every 6 (six) hours as needed. (Patient not taking: Reported on 08/17/2022), Disp: 8 g, Rfl: 2   amiodarone (PACERONE) 200 MG tablet, Take 0.5 tablets (100 mg total) by mouth daily., Disp: 45 tablet, Rfl: 1   amLODipine (NORVASC) 5 MG tablet, TAKE 1 TABLET (5 MG TOTAL) BY MOUTH DAILY., Disp: 90 tablet, Rfl: 0   apixaban (ELIQUIS) 2.5 MG TABS tablet, Take 1 tablet (2.5 mg total) by mouth 2 (two) times daily., Disp: 60 tablet, Rfl: 3   azelastine (ASTELIN) 0.1 % nasal spray, Place 1 spray into both nostrils 2 (two) times daily. Use in each nostril as directed, Disp: 30 mL, Rfl: 5   benzonatate (TESSALON) 200 MG capsule, Take 1 capsule (200 mg total) by mouth 3 (three) times daily as needed. (Patient not taking: Reported on 08/17/2022), Disp: 45 capsule, Rfl: 1   cetirizine (ZYRTEC) 10 MG tablet, Take 10 mg by mouth daily. (Patient not taking: Reported on 08/17/2022), Disp: , Rfl:    chlorpheniramine (CHLOR-TRIMETON) 4 MG tablet, Take 4 mg by mouth daily. (Patient not taking: Reported on 08/17/2022), Disp: , Rfl:    dapagliflozin propanediol (FARXIGA) 10 MG TABS tablet, Take 1 tablet (10 mg total) by mouth daily before breakfast., Disp: 30 tablet, Rfl: 3   doxycycline (VIBRA-TABS) 100 MG tablet, Take 1 tablet (100 mg total) by mouth 2 (two) times daily. (Patient not taking: Reported on 08/17/2022), Disp: 14 tablet,  Rfl: 0   EPINEPHrine 0.3 mg/0.3 mL IJ SOAJ injection, Inject 0.3 mg into the muscle as needed for anaphylaxis., Disp: , Rfl:    famotidine (PEPCID) 20 MG tablet, Take 20 mg by mouth daily., Disp: , Rfl:    fexofenadine (ALLEGRA) 180 MG tablet, Take 180 mg by mouth daily. (Patient not taking: Reported on 08/17/2022), Disp: , Rfl:    fluticasone furoate-vilanterol (BREO ELLIPTA) 100-25 MCG/ACT AEPB, Inhale 1 puff into the lungs daily., Disp: 30 each, Rfl: 5   furosemide (LASIX) 20 MG tablet, Take 1 tablet (20 mg total) by mouth daily., Disp: 180 tablet, Rfl: 2   glucosamine-chondroitin 500-400 MG tablet, Take 1 tablet by mouth 2 (two) times daily. 1 pm, Disp: , Rfl:    Krill Oil 500 MG CAPS, Take by mouth., Disp: , Rfl:    Multiple Vitamin (MULTIVITAMIN) capsule, Take 1 capsule by mouth daily., Disp: , Rfl:    potassium chloride SA (KLOR-CON M20) 20 MEQ tablet, Take 1 tablet (20 mEq total) by mouth daily., Disp: 90 tablet, Rfl: 3   pravastatin (PRAVACHOL) 40 MG tablet, TAKE 1 TABLET BY MOUTH EVERYDAY AT BEDTIME (Patient taking differently: Once a day with Lasix), Disp: 90 tablet, Rfl: 3   prednisoLONE acetate (PRED FORTE) 1 % ophthalmic suspension, Apply to eye., Disp: , Rfl:    predniSONE (DELTASONE) 20 MG tablet, Take 1 tablet (20 mg total) by mouth daily with breakfast. (Patient not taking: Reported on 08/17/2022), Disp: 5 tablet, Rfl:  0   sertraline (ZOLOFT) 25 MG tablet, TAKE 1 TABLET BY MOUTH EVERY DAY, Disp: 100 tablet, Rfl: 3   sodium chloride (MURO 128) 5 % ophthalmic solution, PLACE 1 DROP INTO RIGHT EYE 4 TIMES A DAY, Disp: , Rfl:    triamcinolone (NASACORT ALLERGY 24HR) 55 MCG/ACT AERO nasal inhaler, Place 2 sprays into the nose daily. (Patient not taking: Reported on 08/17/2022), Disp: , Rfl:    valsartan (DIOVAN) 160 MG tablet, TAKE 1 TABLET BY MOUTH 2 TIMES DAILY., Disp: 180 tablet, Rfl: 0   vitamin B-12 (CYANOCOBALAMIN) 500 MCG tablet, Take 500 mcg by mouth daily., Disp: , Rfl:   Past  Medical History: Past Medical History:  Diagnosis Date   Adenomatous colon polyp 2005   Allergy    perennial ; shots from Dr Corinda Gubler   Aortic insufficiency    Hyperlipidemia    Hypertension    Murmur    a.  04/2011 Echo:  EF 55-60%, mild-mod ai/mr   Squamous cell carcinoma of skin 08/05/2020   left pretibia EDC   Uterine cancer (HCC)     Tobacco Use: Social History   Tobacco Use  Smoking Status Never   Passive exposure: Past  Smokeless Tobacco Never    Labs: Review Flowsheet  More data exists      Latest Ref Rng & Units 05/31/2016 06/21/2017 08/14/2018 12/03/2020 12/19/2021  Labs for ITP Cardiac and Pulmonary Rehab  Cholestrol 0 - 200 mg/dL 161  096  045  409  811   LDL (calc) 0 - 99 mg/dL 91  77  71  60  77   HDL-C >39.00 mg/dL 91.47  82.95  62.13  08.65  50.20   Trlycerides 0.0 - 149.0 mg/dL 78.4  69.6  29.5  28.4  68.0   Hemoglobin A1c 4.6 - 6.5 % 5.8  5.8  5.8  - -     Pulmonary Assessment Scores:  Pulmonary Assessment Scores     Row Name 06/14/22 1444         ADL UCSD   ADL Phase Entry     SOB Score total 7     Rest 0     Walk 0     Stairs 1     Bath 0     Dress 0     Shop 0       CAT Score   CAT Score 5       mMRC Score   mMRC Score 2              UCSD: Self-administered rating of dyspnea associated with activities of daily living (ADLs) 6-point scale (0 = "not at all" to 5 = "maximal or unable to do because of breathlessness")  Scoring Scores range from 0 to 120.  Minimally important difference is 5 units  CAT: CAT can identify the health impairment of COPD patients and is better correlated with disease progression.  CAT has a scoring range of zero to 40. The CAT score is classified into four groups of low (less than 10), medium (10 - 20), high (21-30) and very high (31-40) based on the impact level of disease on health status. A CAT score over 10 suggests significant symptoms.  A worsening CAT score could be explained by an exacerbation,  poor medication adherence, poor inhaler technique, or progression of COPD or comorbid conditions.  CAT MCID is 2 points  mMRC: mMRC (Modified Medical Research Council) Dyspnea Scale is used to assess the degree of baseline  functional disability in patients of respiratory disease due to dyspnea. No minimal important difference is established. A decrease in score of 1 point or greater is considered a positive change.   Pulmonary Function Assessment:   Exercise Target Goals: Exercise Program Goal: Individual exercise prescription set using results from initial 6 min walk test and THRR while considering  patient's activity barriers and safety.   Exercise Prescription Goal: Initial exercise prescription builds to 30-45 minutes a day of aerobic activity, 2-3 days per week.  Home exercise guidelines will be given to patient during program as part of exercise prescription that the participant will acknowledge.  Education: Aerobic Exercise: - Group verbal and visual presentation on the components of exercise prescription. Introduces F.I.T.T principle from ACSM for exercise prescriptions.  Reviews F.I.T.T. principles of aerobic exercise including progression. Written material given at graduation.   Education: Resistance Exercise: - Group verbal and visual presentation on the components of exercise prescription. Introduces F.I.T.T principle from ACSM for exercise prescriptions  Reviews F.I.T.T. principles of resistance exercise including progression. Written material given at graduation.    Education: Exercise & Equipment Safety: - Individual verbal instruction and demonstration of equipment use and safety with use of the equipment. Flowsheet Row Pulmonary Rehab from 08/10/2022 in [facility] Cardiac and Pulmonary Rehab  Date 06/14/22  Educator [facility]  Instruction Review Code 1- Verbalizes Understanding       Education: Exercise Physiology & General Exercise Guidelines: - Group verbal and written  instruction with models to review the exercise physiology of the cardiovascular system and associated critical values. Provides general exercise guidelines with specific guidelines to those with heart or lung disease.    Education: Flexibility, Balance, Mind/Body Relaxation: - Group verbal and visual presentation with interactive activity on the components of exercise prescription. Introduces F.I.T.T principle from ACSM for exercise prescriptions. Reviews F.I.T.T. principles of flexibility and balance exercise training including progression. Also discusses the mind body connection.  Reviews various relaxation techniques to help reduce and manage stress (i.e. Deep breathing, progressive muscle relaxation, and visualization). Balance handout provided to take home. Written material given at graduation.   Activity Barriers & Risk Stratification:  Activity Barriers & Cardiac Risk Stratification - 06/14/22 1342       Activity Barriers & Cardiac Risk Stratification   Activity Barriers Back Problems             6 Minute Walk:  6 Minute Walk     Row Name 06/14/22 1321         6 Minute Walk   Phase Initial     Distance 1080 feet     Walk Time 6 minutes     # of Rest Breaks 0     MPH 2.05     METS 1.97     RPE 12     Perceived Dyspnea  0     VO2 Peak 6.88     Symptoms No     Resting HR 71 bpm     Resting BP 138/82     Resting Oxygen Saturation  96 %     Exercise Oxygen Saturation  during 6 min walk 94 %     Max Ex. HR 80 bpm     Max Ex. BP 160/62     2 Minute Post BP 142/72       Interval HR   1 Minute HR 77     2 Minute HR 78     3 Minute HR 78     4  Minute HR 79     5 Minute HR 79     6 Minute HR 80     2 Minute Post HR 69     Interval Heart Rate? Yes       Interval Oxygen   Interval Oxygen? Yes     Baseline Oxygen Saturation % 96 %     1 Minute Oxygen Saturation % 97 %     1 Minute Liters of Oxygen 0 L     2 Minute Oxygen Saturation % 94 %     2 Minute Liters of  Oxygen 0 L     3 Minute Oxygen Saturation % 94 %     3 Minute Liters of Oxygen 0 L     4 Minute Oxygen Saturation % 95 %     4 Minute Liters of Oxygen 0 L     5 Minute Oxygen Saturation % 96 %     5 Minute Liters of Oxygen 0 L     6 Minute Oxygen Saturation % 97 %     6 Minute Liters of Oxygen 0 L     2 Minute Post Oxygen Saturation % 98 %     2 Minute Post Liters of Oxygen 0 L             Oxygen Initial Assessment:  Oxygen Initial Assessment - 06/14/22 1444       Home Oxygen   Home Oxygen Device None    Sleep Oxygen Prescription Continuous    Liters per minute 2    Home Exercise Oxygen Prescription None    Home Resting Oxygen Prescription None      Initial 6 min Walk   Oxygen Used None      Program Oxygen Prescription   Program Oxygen Prescription None      Intervention   Short Term Goals To learn and understand importance of monitoring SPO2 with pulse oximeter and demonstrate accurate use of the pulse oximeter.;To learn and understand importance of maintaining oxygen saturations>88%;To learn and demonstrate proper pursed lip breathing techniques or other breathing techniques. ;To learn and demonstrate proper use of respiratory medications    Long  Term Goals Verbalizes importance of monitoring SPO2 with pulse oximeter and return demonstration;Maintenance of O2 saturations>88%;Exhibits proper breathing techniques, such as pursed lip breathing or other method taught during program session;Compliance with respiratory medication;Demonstrates proper use of MDI's             Oxygen Re-Evaluation:  Oxygen Re-Evaluation     Row Name 06/20/22 1003 07/04/22 1358 07/27/22 1019         Program Oxygen Prescription   Program Oxygen Prescription -- None None       Home Oxygen   Home Oxygen Device -- None Home Concentrator     Sleep Oxygen Prescription -- Continuous Continuous     Liters per minute -- 2 1     Home Exercise Oxygen Prescription -- None None     Home Resting  Oxygen Prescription -- None None     Compliance with Home Oxygen Use -- -- Yes       Goals/Expected Outcomes   Short Term Goals -- To learn and understand importance of monitoring SPO2 with pulse oximeter and demonstrate accurate use of the pulse oximeter.;To learn and understand importance of maintaining oxygen saturations>88%;To learn and demonstrate proper pursed lip breathing techniques or other breathing techniques. ;To learn and demonstrate proper use of respiratory medications To learn and understand importance of  maintaining oxygen saturations>88%;To learn and understand importance of monitoring SPO2 with pulse oximeter and demonstrate accurate use of the pulse oximeter.     Long  Term Goals -- Verbalizes importance of monitoring SPO2 with pulse oximeter and return demonstration;Maintenance of O2 saturations>88%;Exhibits proper breathing techniques, such as pursed lip breathing or other method taught during program session;Compliance with respiratory medication;Demonstrates proper use of MDI's Verbalizes importance of monitoring SPO2 with pulse oximeter and return demonstration;Maintenance of O2 saturations>88%     Comments Reviewed PLB technique with pt.  Talked about how it works and it's importance in maintaining their exercise saturations. [name] states she is compliant with using her sleep oxygen. She does have a pulse ox to monitor her HR and O2 levels and is aware to make sure her oxygen levels stays above 88%. We talked more about PLB today and she is going to practice it more. She experiences a lot of sinus issues which impacts her SOB at times, but continues to take medications for it to help relieve some symptoms. She has a pulse oximeter to check her oxygen saturation at home. Informed him/her where to get one and explained why it is important to have one. Reviewed that oxygen saturations should be 88 percent and above.     Goals/Expected Outcomes Short: Become more profiecient at using  PLB. Long: Become independent at using PLB. Short: Continue to monitor O2 saturations closely, notify of any concerns Long: Continue to become proficient at using PLB and monitoring oxygen Short: monitor oxygen at home with exertion. Long: maintain oxygen saturations above 88 percent independently.              Oxygen Discharge (Final Oxygen Re-Evaluation):  Oxygen Re-Evaluation - 07/27/22 1019       Program Oxygen Prescription   Program Oxygen Prescription None      Home Oxygen   Home Oxygen Device Home Concentrator    Sleep Oxygen Prescription Continuous    Liters per minute 1    Home Exercise Oxygen Prescription None    Home Resting Oxygen Prescription None    Compliance with Home Oxygen Use Yes      Goals/Expected Outcomes   Short Term Goals To learn and understand importance of maintaining oxygen saturations>88%;To learn and understand importance of monitoring SPO2 with pulse oximeter and demonstrate accurate use of the pulse oximeter.    Long  Term Goals Verbalizes importance of monitoring SPO2 with pulse oximeter and return demonstration;Maintenance of O2 saturations>88%    Comments She has a pulse oximeter to check her oxygen saturation at home. Informed him/her where to get one and explained why it is important to have one. Reviewed that oxygen saturations should be 88 percent and above.    Goals/Expected Outcomes Short: monitor oxygen at home with exertion. Long: maintain oxygen saturations above 88 percent independently.             Initial Exercise Prescription:  Initial Exercise Prescription - 06/14/22 1300       Date of Initial Exercise RX and Referring Provider   Date 06/14/22    Referring Provider Bensimhon      Oxygen   Maintain Oxygen Saturation 88% or higher      Recumbant Bike   Level 2    RPM 50    Watts 15    Minutes 15      NuStep   Level 2    SPM 80    Minutes 15    METs 1.97  REL-XR   Level 2    Speed 50    Minutes 15    METs  1.97      Track   Laps 30    Minutes 15    METs 2.63      Prescription Details   Frequency (times per week) 2    Duration Progress to 30 minutes of continuous aerobic without signs/symptoms of physical distress      Intensity   THRR 40-80% of Max Heartrate 97-124    Ratings of Perceived Exertion 11-13    Perceived Dyspnea 0-4      Progression   Progression Continue to progress workloads to maintain intensity without signs/symptoms of physical distress.      Resistance Training   Training Prescription Yes    Weight 2    Reps 10-15             Perform Capillary Blood Glucose checks as needed.  Exercise Prescription Changes:   Exercise Prescription Changes     Row Name 06/14/22 1300 07/03/22 1200 07/04/22 1100 07/17/22 1400 08/01/22 0700     Response to Exercise   Blood Pressure (Admit) 138/82 114/58 -- 130/62 138/60   Blood Pressure (Exercise) 160/62 126/58 -- 134/62 158/60   Blood Pressure (Exit) 142/72 118/60 -- 120/58 132/58   Heart Rate (Admit) 71 bpm 65 bpm -- 71 bpm 63 bpm   Heart Rate (Exercise) 80 bpm 98 bpm -- 98 bpm 80 bpm   Heart Rate (Exit) 69 bpm 58 bpm -- 72 bpm 65 bpm   Oxygen Saturation (Admit) 96 % 97 % -- 94 % 92 %   Oxygen Saturation (Exercise) 94 % 91 % -- 90 % 97 %   Oxygen Saturation (Exit) 98 % 94 % -- 94 % 97 %   Rating of Perceived Exertion (Exercise) 12 13 -- 13 13   Perceived Dyspnea (Exercise) 0 2 -- 2 --   Symptoms none SOB -- SOB none   Comments 6 MWT results 2nd full week of exercise -- -- --   Duration -- Progress to 30 minutes of  aerobic without signs/symptoms of physical distress -- Continue with 30 min of aerobic exercise without signs/symptoms of physical distress. Continue with 30 min of aerobic exercise without signs/symptoms of physical distress.   Intensity -- THRR unchanged -- THRR unchanged THRR unchanged     Progression   Progression -- Continue to progress workloads to maintain intensity without signs/symptoms of  physical distress. -- Continue to progress workloads to maintain intensity without signs/symptoms of physical distress. Continue to progress workloads to maintain intensity without signs/symptoms of physical distress.   Average METs -- 2.56 -- 2.3 2.69     Resistance Training   Training Prescription -- Yes -- Yes Yes   Weight -- 2 lb -- 2 lb 3 lb   Reps -- 10-15 -- 10-15 10-15     Interval Training   Interval Training -- No -- No No     NuStep   Level -- 1 -- 2 2   Minutes -- 15 -- 15 15   METs -- 2.1 -- 2.3 2.2     REL-XR   Level -- 1 -- 1 2   Minutes -- 15 -- 15 15   METs -- 3.3 -- 2.5 --     Track   Laps -- 27 -- 30 40   Minutes -- 15 -- 15 15   METs -- 2.47 -- 2.63 3.18     Home  Exercise Plan   Plans to continue exercise at -- -- Home (comment)  walking Home (comment)  walking Home (comment)  walking   Frequency -- -- Add 2 additional days to program exercise sessions. Add 2 additional days to program exercise sessions. Add 2 additional days to program exercise sessions.   Initial Home Exercises Provided -- -- 07/04/22 07/04/22 07/04/22     Oxygen   Maintain Oxygen Saturation -- 88% or higher 88% or higher 88% or higher 88% or higher    Row Name 08/14/22 1400 09/01/22 0700           Response to Exercise   Blood Pressure (Admit) 118/62 126/52      Blood Pressure (Exit) 100/56 134/72      Heart Rate (Admit) 65 bpm 62 bpm      Heart Rate (Exercise) 75 bpm 89 bpm      Heart Rate (Exit) 63 bpm 64 bpm      Oxygen Saturation (Admit) 94 % 95 %      Oxygen Saturation (Exercise) 90 % 90 %      Oxygen Saturation (Exit) 96 % 94 %      Rating of Perceived Exertion (Exercise) 13 14      Symptoms none none      Duration Continue with 30 min of aerobic exercise without signs/symptoms of physical distress. Continue with 30 min of aerobic exercise without signs/symptoms of physical distress.      Intensity THRR unchanged THRR unchanged        Progression   Progression Continue  to progress workloads to maintain intensity without signs/symptoms of physical distress. Continue to progress workloads to maintain intensity without signs/symptoms of physical distress.      Average METs 3.16 3.18        Resistance Training   Training Prescription Yes Yes      Weight 3 lb 3 lb      Reps 10-15 10-15        Interval Training   Interval Training No No        NuStep   Level 5 5      Minutes 15 15      METs 2.6 --        REL-XR   Level 3 1      Minutes 15 15      METs 4.5 4        Track   Laps 42 45      Minutes 15 15      METs 3.28 3.45        Home Exercise Plan   Plans to continue exercise at Home (comment)  walking Home (comment)  walking      Frequency Add 2 additional days to program exercise sessions. Add 2 additional days to program exercise sessions.      Initial Home Exercises Provided 07/04/22 07/04/22        Oxygen   Maintain Oxygen Saturation 88% or higher 88% or higher               Exercise Comments:   Exercise Comments     Row Name 06/20/22 1002           Exercise Comments First full day of exercise!  Patient was oriented to gym and equipment including functions, settings, policies, and procedures.  Patient's individual exercise prescription and treatment plan were reviewed.  All starting workloads were established based on the results of the 6 minute walk test done  at initial orientation visit.  The plan for exercise progression was also introduced and progression will be customized based on patient's performance and goals.                Exercise Goals and Review:   Exercise Goals     Row Name 06/14/22 1346             Exercise Goals   Increase Physical Activity Yes       Intervention Provide advice, education, support and counseling about physical activity/exercise needs.;Develop an individualized exercise prescription for aerobic and resistive training based on initial evaluation findings, risk stratification,  comorbidities and participant's personal goals.       Expected Outcomes Short Term: Attend rehab on a regular basis to increase amount of physical activity.;Long Term: Add in home exercise to make exercise part of routine and to increase amount of physical activity.;Long Term: Exercising regularly at least 3-5 days a week.       Increase Strength and Stamina Yes       Intervention Provide advice, education, support and counseling about physical activity/exercise needs.;Develop an individualized exercise prescription for aerobic and resistive training based on initial evaluation findings, risk stratification, comorbidities and participant's personal goals.       Expected Outcomes Short Term: Increase workloads from initial exercise prescription for resistance, speed, and METs.;Short Term: Perform resistance training exercises routinely during rehab and add in resistance training at home;Long Term: Improve cardiorespiratory fitness, muscular endurance and strength as measured by increased METs and functional capacity ( )       Able to understand and use rate of perceived exertion (RPE) scale Yes       Intervention Provide education and explanation on how to use RPE scale       Expected Outcomes Short Term: Able to use RPE daily in rehab to express subjective intensity level;Long Term:  Able to use RPE to guide intensity level when exercising independently       Able to understand and use Dyspnea scale Yes       Intervention Provide education and explanation on how to use Dyspnea scale       Expected Outcomes Short Term: Able to use Dyspnea scale daily in rehab to express subjective sense of shortness of breath during exertion;Long Term: Able to use Dyspnea scale to guide intensity level when exercising independently       Knowledge and understanding of Target Heart Rate Range (THRR) Yes       Intervention Provide education and explanation of THRR including how the numbers were predicted and where they  are located for reference       Expected Outcomes Short Term: Able to state/look up THRR;Long Term: Able to use THRR to govern intensity when exercising independently;Short Term: Able to use daily as guideline for intensity in rehab       Able to check pulse independently Yes       Intervention Provide education and demonstration on how to check pulse in carotid and radial arteries.;Review the importance of being able to check your own pulse for safety during independent exercise       Expected Outcomes Short Term: Able to explain why pulse checking is important during independent exercise;Long Term: Able to check pulse independently and accurately       Understanding of Exercise Prescription Yes       Intervention Provide education, explanation, and written materials on patient's individual exercise prescription       Expected  Outcomes Short Term: Able to explain program exercise prescription;Long Term: Able to explain home exercise prescription to exercise independently                Exercise Goals Re-Evaluation :  Exercise Goals Re-Evaluation     Row Name 06/20/22 1002 07/03/22 1236 07/04/22 1147 07/17/22 1435 08/01/22 0734     Exercise Goal Re-Evaluation   Exercise Goals Review Able to understand and use rate of perceived exertion (RPE) scale;Able to understand and use Dyspnea scale;Knowledge and understanding of Target Heart Rate Range (THRR);Understanding of Exercise Prescription Increase Physical Activity;Increase Strength and Stamina;Understanding of Exercise Prescription Increase Physical Activity;Increase Strength and Stamina;Understanding of Exercise Prescription Increase Physical Activity;Increase Strength and Stamina;Understanding of Exercise Prescription Increase Physical Activity;Increase Strength and Stamina;Understanding of Exercise Prescription   Comments Reviewed RPE scale, THR and program prescription with pt today.  Pt voiced understanding and was given a copy of goals to  take home. [name] has done well for her first couple of weeks she has been at rehab. She has been able to reach 27 laps on the track- her goal is 30 and we hope to see her reach that! She also has exercised at level 1 on the both the XR and T4 Nustep. Her oxygen saturations are staying above 88% and RPEs are appropriate. Will continue to monitor. Reviewed home exercise with pt today.  Pt plans to walk for exercise at this time.  [name] was part of the [facility] back in 2019 and is thinking about re-joining again. Reviewed THR, pulse, RPE, sign and symptoms, pulse oximetery and when to call 911 or MD.  Also discussed weather considerations and indoor options.  Pt voiced understanding. [name] is doing well in the program. She recently increased her laps on the track to 30 laps. She also improved to level 2 on the T4 nustep. She has continued to use 3 lb hand weights for resistance training as well. We will continue to monitor her progress in the program. [name] continues to do well in rehab.  She is now up to 40 laps aiming to get to a mile!  We will conitnue to monitor her progress.   Expected Outcomes Short: Use RPE daily to regulate intensity. Long: Follow program prescription in THR. Short: Increase laps to 30 Long: Increase overall MET level and stamina Short: monitor HR and O2 closely when exercising Long: Continue to exercise routinely independently at home Short: Continue to push for more laps on the track. Long: Continue to improve strength and stamina. Short: Aim for a mile walking Long: conitnue to improve stamina    Row Name 08/14/22 1454 09/01/22 0737           Exercise Goal Re-Evaluation   Exercise Goals Review Increase Physical Activity;Increase Strength and Stamina;Understanding of Exercise Prescription Increase Physical Activity;Increase Strength and Stamina;Understanding of Exercise Prescription      Comments [name] continues to do well in rehab. She is now walking up to 42 laps on  the track. She also improved to level 5 on the T4 nustep and level 3 on the XR. She increased her overall average MET level to 3.16 METs as well. We will conitnue to monitor her progress. [name] is doing well in rehab. She continues to walk above 40 laps on the track with a max of 45 laps. She also has continued to do well with 3 lb hand weights and may benefit from increasing to 4 lb hand weights. We will continue to monitor her progress  in the program.      Expected Outcomes Short: Continue to push for more laps on the track. Long: Continue to improve strength and stamina. Short: Try 4 lb hand weights for resistance training. Long: Continue to improve strength and stamina.               Discharge Exercise Prescription (Final Exercise Prescription Changes):  Exercise Prescription Changes - 09/01/22 0700       Response to Exercise   Blood Pressure (Admit) 126/52    Blood Pressure (Exit) 134/72    Heart Rate (Admit) 62 bpm    Heart Rate (Exercise) 89 bpm    Heart Rate (Exit) 64 bpm    Oxygen Saturation (Admit) 95 %    Oxygen Saturation (Exercise) 90 %    Oxygen Saturation (Exit) 94 %    Rating of Perceived Exertion (Exercise) 14    Symptoms none    Duration Continue with 30 min of aerobic exercise without signs/symptoms of physical distress.    Intensity THRR unchanged      Progression   Progression Continue to progress workloads to maintain intensity without signs/symptoms of physical distress.    Average METs 3.18      Resistance Training   Training Prescription Yes    Weight 3 lb    Reps 10-15      Interval Training   Interval Training No      NuStep   Level 5    Minutes 15      REL-XR   Level 1    Minutes 15    METs 4      Track   Laps 45    Minutes 15    METs 3.45      Home Exercise Plan   Plans to continue exercise at Home (comment)   walking   Frequency Add 2 additional days to program exercise sessions.    Initial Home Exercises Provided 07/04/22       Oxygen   Maintain Oxygen Saturation 88% or higher             Nutrition:  Target Goals: Understanding of nutrition guidelines, daily intake of sodium 1500mg , cholesterol 200mg , calories 30% from fat and 7% or less from saturated fats, daily to have 5 or more servings of fruits and vegetables.  Education: All About Nutrition: -Group instruction provided by verbal, written material, interactive activities, discussions, models, and posters to present general guidelines for heart healthy nutrition including fat, fiber, MyPlate, the role of sodium in heart healthy nutrition, utilization of the nutrition label, and utilization of this knowledge for meal planning. Follow up email sent as well. Written material given at graduation. Flowsheet Row Pulmonary Rehab from 08/10/2022 in [facility] Pul[facility]  Education need identified 06/14/22       Biometrics:  Pre Biometrics - 06/14/22 1346       Pre Biometrics   Height 5\' 3"  (1.6 m)    Weight 126 lb 11.2 oz (57.5 kg)    Waist Circumference 32.5 inches    Hip Circumference 37 inches    Waist to Hip Ratio 0.88 %    BMI (Calculated) 22.45    Single Leg Stand 6.81 seconds              Nutrition Therapy Plan and Nutrition Goals:  Nutrition Therapy & Goals - 06/14/22 1442       Intervention Plan   Intervention Prescribe, educate and counsel regarding individualized specific dietary modifications aiming towards  targeted core components such as weight, hypertension, lipid management, diabetes, heart failure and other comorbidities.    Expected Outcomes Short Term Goal: Understand basic principles of dietary content, such as calories, fat, sodium, cholesterol and nutrients.;Short Term Goal: A plan has been developed with personal nutrition goals set during dietitian appointment.;Long Term Goal: Adherence to prescribed nutrition plan.             Nutrition Assessments:  MEDIFICTS Score Key: ?70 Need to make dietary  changes  40-70 Heart Healthy Diet ? 40 Therapeutic Level Cholesterol Diet  Flowsheet Row Pulmonary Rehab from 06/14/2022 in [facility] Cardiac and Pulmonary Rehab  Picture Your Plate Total Score on Admission 68      Picture Your Plate Scores: <16 Unhealthy dietary pattern with much room for improvement. 41-50 Dietary pattern unlikely to meet recommendations for good health and room for improvement. 51-60 More healthful dietary pattern, with some room for improvement.  >60 Healthy dietary pattern, although there may be some specific behaviors that could be improved.   Nutrition Goals Re-Evaluation:  Nutrition Goals Re-Evaluation     Row Name 07/04/22 1031 07/27/22 1022           Goals   Current Weight -- 126 lb (57.2 kg)      Nutrition Goal Patient is interested in meeting with the RD, we are currently in transition in hiring a new dietician and once they are on board, will meet with the patient. Patient would like to focus more on cooking at home and specifically for herself only. Eat smaller more frequent portion sizes.      Comment [name] states she generally follows a "diabetic friendly diet" because her and her husband managed it together for him until he passed. She already focuses on smaller frequent meals. She admits she does go out and get food a lot which could consist of salads with chicken, bean salads. We talked about being mindful about sodium as restaurant food can have a higher sodium content. Now that she is living by herself, she struggles with cooking for herself only. She does freeze her leftovers to help not waste food. I did provide her a couple handouts on healthy budget eating and tips to review. She is interested in meeting with the RD, in which we are in transition with at this time. Patient was informed on why it is important to maintain a balanced diet when dealing with Respiratory issues. Explained that it takes a lot of energy to breath and when they are short of  breath often they will need to have a good diet to help keep up with the calories they are expending for breathing.      Expected Outcome Short: Review paperwork Long: Continue to eat healthy pulmonary based diet Short: Choose and plan snacks accordingly to patients caloric intake to improve breathing. Long: Maintain a diet independently that meets their caloric intake to aid in daily shortness of breath.               Nutrition Goals Discharge (Final Nutrition Goals Re-Evaluation):  Nutrition Goals Re-Evaluation - 07/27/22 1022       Goals   Current Weight 126 lb (57.2 kg)    Nutrition Goal Eat smaller more frequent portion sizes.    Comment Patient was informed on why it is important to maintain a balanced diet when dealing with Respiratory issues. Explained that it takes a lot of energy to breath and when they are short of breath often they will  need to have a good diet to help keep up with the calories they are expending for breathing.    Expected Outcome Short: Choose and plan snacks accordingly to patients caloric intake to improve breathing. Long: Maintain a diet independently that meets their caloric intake to aid in daily shortness of breath.             Psychosocial: Target Goals: Acknowledge presence or absence of significant depression and/or stress, maximize coping skills, provide positive support system. Participant is able to verbalize types and ability to use techniques and skills needed for reducing stress and depression.   Education: Stress, Anxiety, and Depression - Group verbal and visual presentation to define topics covered.  Reviews how body is impacted by stress, anxiety, and depression.  Also discusses healthy ways to reduce stress and to treat/manage anxiety and depression.  Written material given at graduation.   Education: Sleep Hygiene -Provides group verbal and written instruction about how sleep can affect your health.  Define sleep hygiene, discuss  sleep cycles and impact of sleep habits. Review good sleep hygiene tips.    Initial Review & Psychosocial Screening:  Initial Psych Review & Screening - 06/07/22 1009       Initial Review   Current issues with Current Stress Concerns      Family Dynamics   Good Support System? Yes   2 daughters, neighbor     Barriers   Psychosocial barriers to participate in program There are no identifiable barriers or psychosocial needs.      Screening Interventions   Interventions Encouraged to exercise;To provide support and resources with identified psychosocial needs;Provide feedback about the scores to participant    Expected Outcomes Short Term goal: Utilizing psychosocial counselor, staff and physician to assist with identification of specific Stressors or current issues interfering with healing process. Setting desired goal for each stressor or current issue identified.;Long Term Goal: Stressors or current issues are controlled or eliminated.;Short Term goal: Identification and review with participant of any Quality of Life or Depression concerns found by scoring the questionnaire.;Long Term goal: The participant improves quality of Life and PHQ9 Scores as seen by post scores and/or verbalization of changes             Quality of Life Scores:  Scores of 19 and below usually indicate a poorer quality of life in these areas.  A difference of  2-3 points is a clinically meaningful difference.  A difference of 2-3 points in the total score of the Quality of Life Index has been associated with significant improvement in overall quality of life, self-image, physical symptoms, and general health in studies assessing change in quality of life.  PHQ-9: Review Flowsheet  More data exists      06/14/2022 12/19/2021 05/31/2016 05/28/2015 05/20/2014  Depression screen PHQ 2/9  Decreased Interest 0 0 0 0 0 0 0  Down, Depressed, Hopeless 0 0 0 0 0 0 0  PHQ - 2 Score 0 0 0 0 0 0 0  Altered sleeping 0 - -  - -  Tired, decreased energy 1 - - - -  Change in appetite 1 - - - -  Feeling bad or failure about yourself  0 - - - -  Trouble concentrating 0 - - - -  Moving slowly or fidgety/restless 1 - - - -  Suicidal thoughts 0 - - - -  PHQ-9 Score 3 - - - -  Difficult doing work/chores Not difficult at all - - - -  Interpretation of Total Score  Total Score Depression Severity:  1-4 = Minimal depression, 5-9 = Mild depression, 10-14 = Moderate depression, 15-19 = Moderately severe depression, 20-27 = Severe depression   Psychosocial Evaluation and Intervention:  Psychosocial Evaluation - 06/07/22 1036       Psychosocial Evaluation & Interventions   Interventions Encouraged to exercise with the program and follow exercise prescription    Comments [name] is coming to pulmonary rehab with the diagnosis of pulmonary HTN. She has been managing her heart failure symptoms well. She has noticed her breathing has gotten better with fluid managment. She does report her biggest stressor coming to the program is "white coat syndrome," staff reassured her that we will take it day by day and keep that in mind with her blood pressure readings. She has a very supportive family and neighbor who are there if she needs anything. She lives alone and does most of her own care and housekeeping.    Expected Outcomes Short: attend pulmonary rehab for education and exercise. Long: develop and maintain positive self care habits.    Continue Psychosocial Services  Follow up required by staff             Psychosocial Re-Evaluation:  Psychosocial Re-Evaluation     Row Name 07/04/22 1052 07/27/22 1020           Psychosocial Re-Evaluation   Current issues with Current Sleep Concerns None Identified;Current Psychotropic Meds      Comments [name] is doing well mentally. She does have issues with sleep as she experiences bladder issues that causes her to get up multiple times during the night to use the bathroom.  Her and her doctor talked about it and they are not pursing anything at this time. She knows to speak up if it becomes a bigger issue. She stays compliant using her oxygen at night. Her doctor encouraged her to take naps during the day if that helps. Patient takes zoloft which helps. She has good support from her 2 of her daughters. She has been enjoying the program thus far and denies other concerns at this time. Patient reports no issues with their current mental states, sleep, stress, depression or anxiety. Will follow up with patient in a few weeks for any changes.      Expected Outcomes Short: Continue rehab attendance for mood boost Long: Continue to maintain a positive attitude Short: Continue to exercise regularly to support mental health and notify staff of any changes. Long: maintain mental health and well being through teaching of rehab or prescribed medications independently.      Interventions Encouraged to attend Pulmonary Rehabilitation for the exercise Encouraged to attend Pulmonary Rehabilitation for the exercise      Continue Psychosocial Services  Follow up required by staff Follow up required by staff               Psychosocial Discharge (Final Psychosocial Re-Evaluation):  Psychosocial Re-Evaluation - 07/27/22 1020       Psychosocial Re-Evaluation   Current issues with None Identified;Current Psychotropic Meds    Comments Patient reports no issues with their current mental states, sleep, stress, depression or anxiety. Will follow up with patient in a few weeks for any changes.    Expected Outcomes Short: Continue to exercise regularly to support mental health and notify staff of any changes. Long: maintain mental health and well being through teaching of rehab or prescribed medications independently.    Interventions Encouraged to attend Pulmonary Rehabilitation for the  exercise    Continue Psychosocial Services  Follow up required by staff              Education: Education Goals: Education classes will be provided on a weekly basis, covering required topics. Participant will state understanding/return demonstration of topics presented.  Learning Barriers/Preferences:  Learning Barriers/Preferences - 06/07/22 1006       Learning Barriers/Preferences   Learning Barriers Sight    Learning Preferences None             General Pulmonary Education Topics:  Infection Prevention: - Provides verbal and written material to individual with discussion of infection control including proper hand washing and proper equipment cleaning during exercise session. Flowsheet Row Pulmonary Rehab from 08/10/2022 in [facility] Cardiac and Pulmonary Rehab  Date 06/14/22  Educator [facility]  Instruction Review Code 1- Verbalizes Understanding       Falls Prevention: - Provides verbal and written material to individual with discussion of falls prevention and safety. Flowsheet Row Pulmonary Rehab from 08/10/2022 in [facility] Cardiac and Pulmonary Rehab  Date 06/14/22  Educator [facility]  Instruction Review Code 1- Verbalizes Understanding       Chronic Lung Disease Review: - Group verbal instruction with posters, models, PowerPoint presentations and videos,  to review new updates, new respiratory medications, new advancements in procedures and treatments. Providing information on websites and "800" numbers for continued self-education. Includes information about supplement oxygen, available portable oxygen systems, continuous and intermittent flow rates, oxygen safety, concentrators, and Medicare reimbursement for oxygen. Explanation of Pulmonary Drugs, including class, frequency, complications, importance of spacers, rinsing mouth after steroid MDI's, and proper cleaning methods for nebulizers. Review of basic lung anatomy and physiology related to function, structure, and complications of lung disease. Review of risk factors. Discussion about methods for diagnosing sleep  apnea and types of masks and machines for OSA. Includes a review of the use of types of environmental controls: home humidity, furnaces, filters, dust mite/pet prevention, HEPA vacuums. Discussion about weather changes, air quality and the benefits of nasal washing. Instruction on Warning signs, infection symptoms, calling MD promptly, preventive modes, and value of vaccinations. Review of effective airway clearance, coughing and/or vibration techniques. Emphasizing that all should Create an Action Plan. Written material given at graduation. Flowsheet Row Pulmonary Rehab from 08/10/2022 in [facility] Pulmonary Rehab  Education need identified 06/14/22  Date 07/13/22  Educator [facility]  Instruction Review Code 1- Verbalizes Understanding       AED/CPR: - Group verbal and written instruction with the use of models to demonstrate the basic use of the AED with the basic ABC's of resuscitation.    Anatomy and Cardiac Procedures: - Group verbal and visual presentation and models provide information about basic cardiac anatomy and function. Reviews the testing methods done to diagnose heart disease and the outcomes of the test results. Describes the treatment choices: Medical Management, Angioplasty, or Coronary Bypass Surgery for treating various heart conditions including Myocardial Infarction, Angina, Valve Disease, and Cardiac Arrhythmias.  Written material given at graduation.   Medication Safety: - Group verbal and visual instruction to review commonly prescribed medications for heart and lung disease. Reviews the medication, class of the drug, and side effects. Includes the steps to properly store meds and maintain the prescription regimen.  Written material given at graduation.   Other: -Provides group and verbal instruction on various topics (see comments) Flowsheet Row Pulmonary Rehab from 08/10/2022 in [facility] Cardiac and Pulmonary Rehab  Date 08/10/22  Educator [facility]  Instruction  Review Code 1- Verbalizes Understanding       Knowledge Questionnaire Score:  Knowledge Questionnaire Score - 06/14/22 1440       Knowledge Questionnaire Score   Pre Score 8/18              Core Components/Risk Factors/Patient Goals at Admission:  Personal Goals and Risk Factors at Admission - 06/14/22 1442       Core Components/Risk Factors/Patient Goals on Admission    Weight Management Yes    Intervention Weight Management: Develop a combined nutrition and exercise program designed to reach desired caloric intake, while maintaining appropriate intake of nutrient and fiber, sodium and fats, and appropriate energy expenditure required for the weight goal.;Weight Management: Provide education and appropriate resources to help participant work on and attain dietary goals.    Admit Weight 126 lb 11.2 oz (57.5 kg)    Goal Weight: Short Term 127 lb (57.6 kg)    Goal Weight: Long Term 127 lb (57.6 kg)    Expected Outcomes Short Term: Continue to assess and modify interventions until short term weight is achieved;Long Term: Adherence to nutrition and physical activity/exercise program aimed toward attainment of established weight goal;Weight Maintenance: Understanding of the daily nutrition guidelines, which includes 25-35% calories from fat, 7% or less cal from saturated fats, less than 200mg  cholesterol, less than 1.5gm of sodium, & 5 or more servings of fruits and vegetables daily;Understanding recommendations for meals to include 15-35% energy as protein, 25-35% energy from fat, 35-60% energy from carbohydrates, less than 200mg  of dietary cholesterol, 20-35 gm of total fiber daily;Understanding of distribution of calorie intake throughout the day with the consumption of 4-5 meals/snacks    Heart Failure Yes    Intervention Provide a combined exercise and nutrition program that is supplemented with education, support and counseling about heart failure. Directed toward relieving symptoms  such as shortness of breath, decreased exercise tolerance, and extremity edema.    Expected Outcomes Improve functional capacity of life;Short term: Attendance in program 2-3 days a week with increased exercise capacity. Reported lower sodium intake. Reported increased fruit and vegetable intake. Reports medication compliance.;Short term: Daily weights obtained and reported for increase. Utilizing diuretic protocols set by physician.;Long term: Adoption of self-care skills and reduction of barriers for early signs and symptoms recognition and intervention leading to self-care maintenance.    Hypertension Yes    Intervention Provide education on lifestyle modifcations including regular physical activity/exercise, weight management, moderate sodium restriction and increased consumption of fresh fruit, vegetables, and low fat dairy, alcohol moderation, and smoking cessation.;Monitor prescription use compliance.    Expected Outcomes Short Term: Continued assessment and intervention until BP is < 140/32mm HG in hypertensive participants. < 130/7mm HG in hypertensive participants with diabetes, heart failure or chronic kidney disease.;Long Term: Maintenance of blood pressure at goal levels.    Lipids Yes    Intervention Provide education and support for participant on nutrition & aerobic/resistive exercise along with prescribed medications to achieve LDL 70mg , HDL >40mg .    Expected Outcomes Short Term: Participant states understanding of desired cholesterol values and is compliant with medications prescribed. Participant is following exercise prescription and nutrition guidelines.;Long Term: Cholesterol controlled with medications as prescribed, with individualized exercise RX and with personalized nutrition plan. Value goals: LDL < 70mg , HDL > 40 mg.             Education:Diabetes - Individual verbal and written instruction to review signs/symptoms of diabetes, desired ranges of glucose level fasting,  after meals and with exercise. Acknowledge that pre and post exercise glucose checks will be done for 3 sessions at entry of program.   Know Your Numbers and Heart Failure: - Group verbal and visual instruction to discuss disease risk factors for cardiac and pulmonary disease and treatment options.  Reviews associated critical values for Overweight/Obesity, Hypertension, Cholesterol, and Diabetes.  Discusses basics of heart failure: signs/symptoms and treatments.  Introduces Heart Failure Zone chart for action plan for heart failure.  Written material given at graduation.   Core Components/Risk Factors/Patient Goals Review:   Goals and Risk Factor Review     Row Name 07/04/22 1030 07/04/22 1031 07/27/22 1021         Core Components/Risk Factors/Patient Goals Review   Personal Goals Review Hypertension;Heart Failure;Weight Management/Obesity -- Improve shortness of breath with ADL's     Review [name] is checking her BP at home and keeps a log of it at home. She usually runs 120-130s/60-70s. She is stable at rehab as well. She also watches her weight at home and is looking to maintain at this time, she usually ranges between 122-124 lb.  she is aware to be mindful of any sudden weight gain that could be potentially causes from fluid. She denies any heart failure symotoms. She is staying compliant taking all of her medications. -- Spoke to patient about their shortness of breath and what they can do to improve. Patient has been informed of breathing techniques when starting the program. Patient is informed to tell staff if they have had any med changes and that certain meds they are taking or not taking can be causing shortness of breath.     Expected Outcomes Short: Continue to watch weight, look for any abnormal changes Long: Continue to manage lifestyle risk factors -- Short: Attend [facility] regularly to improve shortness of breath with ADL's. Long: maintain independence with ADL's               Core Components/Risk Factors/Patient Goals at Discharge (Final Review):   Goals and Risk Factor Review - 07/27/22 1021       Core Components/Risk Factors/Patient Goals Review   Personal Goals Review Improve shortness of breath with ADL's    Review Spoke to patient about their shortness of breath and what they can do to improve. Patient has been informed of breathing techniques when starting the program. Patient is informed to tell staff if they have had any med changes and that certain meds they are taking or not taking can be causing shortness of breath.    Expected Outcomes Short: Attend [facility] regularly to improve shortness of breath with ADL's. Long: maintain independence with ADL's             ITP Comments:  ITP Comments     Row Name 06/07/22 1048 06/14/22 1321 06/20/22 1001 07/12/22 0942 08/09/22 1134   ITP Comments Initial phone call completed. Diagnosis can be found in [facility] 4/3. EP Orientation scheduled for Wednesday 4/17 at 10:30. Completed and gym orientation. Initial ITP created and sent for review to Dr. Jinny Sanders, Medical Director. First full day of exercise!  Patient was oriented to gym and equipment including functions, settings, policies, and procedures.  Patient's individual exercise prescription and treatment plan were reviewed.  All starting workloads were established based on the results of the 6 minute walk test done at initial orientation visit.  The plan for exercise progression was also introduced and progression will be customized based on patient's performance  and goals. 30 Day review completed. Medical Director ITP review done, changes made as directed, and signed approval by Medical Director.    new to program 30 Day review completed. Medical Director ITP review done, changes made as directed, and signed approval by Medical Director.    Row Name 09/05/22 1508           ITP Comments 30 Day review completed. Medical Director ITP review done,  changes made as directed, and signed approval by Medical Director.                Comments:

## 2022-09-07 ENCOUNTER — Encounter: Payer: Medicare Other | Admitting: *Deleted

## 2022-09-07 DIAGNOSIS — I272 Pulmonary hypertension, unspecified: Secondary | ICD-10-CM | POA: Diagnosis not present

## 2022-09-07 NOTE — Progress Notes (Signed)
Daily Session Note  Patient Details  Name: [name] MRN: [id number] Date of Birth: Jul 19, 1940 Referring Provider:   Flowsheet Row Pulmonary Rehab from 06/14/2022 in [facility] Cardiac and Pulmonary Rehab  Referring Provider Bensimhon       Encounter Date: 09/07/2022  Check In:  Session Check In - 09/07/22 1000       Check-In   Supervising physician immediately available to respond to emergencies See telemetry face sheet for immediately available ER MD    Location [facility]-Cardiac & Pulmonary Rehab    Staff Present Lanny Hurst, RN, ADN;Joseph Alexandria, RCP,RRT,BSRT;Other   Rory Percy, MS   Virtual Visit No    Medication changes reported     No    Fall or balance concerns reported    No    Warm-up and Cool-down Performed on first and last piece of equipment    Resistance Training Performed Yes    VAD Patient? No    PAD/SET Patient? No      Pain Assessment   Currently in Pain? No/denies                Social History   Tobacco Use  Smoking Status Never   Passive exposure: Past  Smokeless Tobacco Never    Goals Met:  Independence with exercise equipment Exercise tolerated well No report of concerns or symptoms today Strength training completed today  Goals Unmet:  Not Applicable  Comments: Pt able to follow exercise prescription today without complaint.  Will continue to monitor for progression.    Dr. Bethann Punches is Medical Director for [facility] Cardiac Rehabilitation.  Dr. Vida Rigger is Medical Director for [facility] Pulmonary Rehabilitation.

## 2022-09-08 NOTE — Telephone Encounter (Signed)
Pt left appt before shceudling 3 mo f/u. Calle dpt to schedule. LVM to return call.

## 2022-09-12 ENCOUNTER — Encounter: Payer: Medicare Other | Admitting: *Deleted

## 2022-09-12 DIAGNOSIS — J3081 Allergic rhinitis due to animal (cat) (dog) hair and dander: Secondary | ICD-10-CM | POA: Diagnosis not present

## 2022-09-12 DIAGNOSIS — J3089 Other allergic rhinitis: Secondary | ICD-10-CM | POA: Diagnosis not present

## 2022-09-12 DIAGNOSIS — I272 Pulmonary hypertension, unspecified: Secondary | ICD-10-CM

## 2022-09-12 DIAGNOSIS — J301 Allergic rhinitis due to pollen: Secondary | ICD-10-CM | POA: Diagnosis not present

## 2022-09-12 NOTE — Progress Notes (Signed)
Daily Session Note  Patient Details  Name: [name] MRN: [id number] Date of Birth: 17-Nov-1940 Referring Provider:   Flowsheet Row Pulmonary Rehab from 06/14/2022 in [facility] Cardiac and Pulmonary Rehab  Referring Provider Bensimhon       Encounter Date: 09/12/2022  Check In:  Session Check In - 09/12/22 1023       Check-In   Supervising physician immediately available to respond to emergencies See telemetry face sheet for immediately available ER MD    Location [facility]-Cardiac & Pulmonary Rehab    Staff Present Susann Givens, RN Mabeline Caras, BS, ACSM CEP, Exercise Physiologist;Susanne Bice, RN, BSN, CCRP    Virtual Visit No    Medication changes reported     No    Fall or balance concerns reported    No    Warm-up and Cool-down Performed on first and last piece of equipment    Resistance Training Performed Yes    VAD Patient? No    PAD/SET Patient? No      Pain Assessment   Currently in Pain? No/denies                Social History   Tobacco Use  Smoking Status Never   Passive exposure: Past  Smokeless Tobacco Never    Goals Met:  Independence with exercise equipment Exercise tolerated well No report of concerns or symptoms today Strength training completed today  Goals Unmet:  Not Applicable  Comments: Pt able to follow exercise prescription today without complaint.  Will continue to monitor for progression.    Dr. Bethann Punches is Medical Director for [facility] Cardiac Rehabilitation.  Dr. Vida Rigger is Medical Director for [facility] Pulmonary Rehabilitation.

## 2022-09-14 ENCOUNTER — Encounter: Payer: Medicare Other | Admitting: *Deleted

## 2022-09-14 DIAGNOSIS — I272 Pulmonary hypertension, unspecified: Secondary | ICD-10-CM

## 2022-09-14 NOTE — Progress Notes (Signed)
Daily Session Note  Patient Details  Name: Oakley Orban MRN: [id number] Date of Birth: [birth year] Referring Provider:   Flowsheet Row Pulmonary Rehab from 06/14/2022 in [facility] Cardiac and Pulmonary Rehab  Referring Provider Bensimhon       Encounter Date: 09/14/2022  Check In:  Session Check In - 09/14/22 1041       Check-In   Supervising physician immediately available to respond to emergencies See telemetry face sheet for immediately available ER MD    Location [facility]-Cardiac & Pulmonary Rehab    Staff Present Lanny Hurst, RN, ADN;Joseph Reino Kent, RCP,RRT,BSRT;Noah Tickle, BS, Exercise Physiologist    Virtual Visit No    Medication changes reported     No    Fall or balance concerns reported    No    Warm-up and Cool-down Performed on first and last piece of equipment    Resistance Training Performed Yes    VAD Patient? No    PAD/SET Patient? No      Pain Assessment   Currently in Pain? No/denies                Social History   Tobacco Use  Smoking Status Never   Passive exposure: Past  Smokeless Tobacco Never    Goals Met:  Independence with exercise equipment Exercise tolerated well No report of concerns or symptoms today Strength training completed today  Goals Unmet:  Not Applicable  Comments: Pt able to follow exercise prescription today without complaint.  Will continue to monitor for progression.    Dr. Bethann Punches is Medical Director for [facility] Cardiac Rehabilitation.  Dr. Vida Rigger is Medical Director for [facility] Pulmonary Rehabilitation.

## 2022-09-18 ENCOUNTER — Encounter: Payer: Self-pay | Admitting: Pulmonary Disease

## 2022-09-18 ENCOUNTER — Ambulatory Visit (INDEPENDENT_AMBULATORY_CARE_PROVIDER_SITE_OTHER): Payer: Medicare Other | Admitting: Pulmonary Disease

## 2022-09-18 VITALS — BP 168/60 | HR 71 | Temp 97.8°F | Ht 63.0 in | Wt 127.0 lb

## 2022-09-18 DIAGNOSIS — I2729 Other secondary pulmonary hypertension: Secondary | ICD-10-CM | POA: Diagnosis not present

## 2022-09-18 DIAGNOSIS — I5032 Chronic diastolic (congestive) heart failure: Secondary | ICD-10-CM

## 2022-09-18 DIAGNOSIS — J4530 Mild persistent asthma, uncomplicated: Secondary | ICD-10-CM

## 2022-09-18 DIAGNOSIS — J3089 Other allergic rhinitis: Secondary | ICD-10-CM

## 2022-09-18 DIAGNOSIS — G4736 Sleep related hypoventilation in conditions classified elsewhere: Secondary | ICD-10-CM

## 2022-09-18 DIAGNOSIS — G4734 Idiopathic sleep related nonobstructive alveolar hypoventilation: Secondary | ICD-10-CM | POA: Diagnosis not present

## 2022-09-18 DIAGNOSIS — I340 Nonrheumatic mitral (valve) insufficiency: Secondary | ICD-10-CM

## 2022-09-18 DIAGNOSIS — I272 Pulmonary hypertension, unspecified: Secondary | ICD-10-CM | POA: Diagnosis not present

## 2022-09-18 MED ORDER — MOMETASONE FUROATE 50 MCG/ACT NA SUSP: 1.0000 | Freq: Two times a day (BID) | NASAL | 2 refills | Status: DC

## 2022-09-18 NOTE — Patient Instructions (Signed)
Stop taking the Allegra.  Try Claritin (loratadine) over-the-counter.  1 tablet daily.  We sent in a prescription for a different nasal spray, it is 1 spray to each nostril twice a day.  Continue oxygen at 1 L/min nocturnally.  Please take your Breo daily.  This is 1 puff daily, rinse your mouth well after you use it.  We will see you in follow-up in 3 to 4 months time call sooner should any new problems arise.

## 2022-09-18 NOTE — Progress Notes (Unsigned)
Subjective:    Patient ID: [name], female    DOB: 18-Jun-1940, 82 y.o.   MRN: [id number]  Patient Care Team: Karie Schwalbe, MD as PCP - General (Internal Medicine) Salena Saner, MD as Consulting Physician (Pulmonary Disease) Bensimhon, Bevelyn Buckles, MD as Consulting Physician (Cardiology)  Chief Complaint  Patient presents with   Follow-up    No SOB, wheezing or cough.     HPI [name] is an 82 year old lifelong never smoker, albeit with secondhand exposure in the past, and a history as noted below, who presents for follow-up of dyspnea.  Recall she was initially seen here on 31 January 2022 and at that time was seen in more of an acute evaluation due to severe dyspnea.  For the details of that visit please refer to that note.  We instituted diuretic therapy with potassium supplementation after her evaluation was more consistent with heart failure.  Laboratory data at the time showed a BNP of 501.  Subsequent clinical evaluation was consistent with severe pulmonary hypertension which had previously been noted (cardiology at [facility]) and mitral valve regurgitation.  Patient had developed atrial fibrillation and is likely worsened her volume status issues.  Since that time she has been followed by cardiology with Dr. Mariah Milling and by Dr. Nicholes Mango for management of her pulmonary hypertension.  She has had studies as noted below.  There is no evidence of ILD.  Her PFTs are a combination of restriction/obstruction she has a significant asthmatic component.  She will need repeat PFTs once she is better compensated from the asthma/CHF issues.   Patient was seen on 12 May 2022 by Rubye Oaks, NP and Breo 100/25 was instituted.  This was given her asthmatic component on PFTs.  Since she started that she feels that she has had no cough or sputum production and no wheezing.  She is now however only using it 2-3 times a week.  I reminded her that this should be dosed daily. Her  dyspnea is better with Lasix and Breo.  The patient does not note any palpitations and no paroxysmal nocturnal dyspnea.  Previously noted orthopnea but this has resolved.  She has not noted any fevers, chills or sweats.  No cough or sputum production, no hemoptysis.  She does complain of nasal congestion.  She had been using Afrin which her daughter had her discontinue due to potential for habituation.  I stressed to the patient that her daughter was correct.  She states that she was on Astelin previously but this is no longer helpful to her.  She uses Allegra on a daily basis.  Has also quit working.  She states the nasal congestion is usually an issue during allergy season.  She has been participating in pulmonary rehab and notes that this has been very helpful.   She does have nocturnal hypoxemia likely on the basis of her pulmonary hypertension and is on nocturnal oxygen and is compliant.  She does not endorse any other symptomatology today.  Overall she feels well and looks well.    DATA 12/14/2020 echocardiogram [facility]): LVEF over 55% VH, severe enlargement of the left and right atria.  Moderate to severe MR/TR, severe pulmonary hypertension. 01/26/2022 chest x-ray PA and lateral: Cardiomegaly, interstitial edema, small bilateral pleural effusions, perihilar bandlike atelectasis. 02/09/2022 echocardiogram: LVEF 60 to 65%, grade II DD, normal RV systolic function RV size normal.  Severely elevated pulmonary artery systolic pressure, RV systolic pressure estimated at 62.8 mm Hg, left atrial size  moderately dilated, moderate MR, mild to moderate AR, no AS. 03/28/2022 PFTs: FEV1 1.21 L or 68% predicted FVC 1.35 L or 56% predicted, FEV1/FVC 90%.  There is significant bronchodilator response.  Mild to moderate restriction, moderate diffusion defect.  Combination of obstructive and restrictive physiology, obstructive physiology with bronchodilator response consistent with asthmatic response. 05/02/2022 CT  chest high-resolution: No evidence of fibrotic interstitial lung disease, air trapping noted suggestive of small airways disease, few small bilateral pulmonary nodules less than or equal to 4 mm in diameter. 06/19/2022 sleep study: Overall RDI was 4.4 indicating no significant OSA.  Overall AHI 3.7.  SpO2 nadir was 82%.  Review of Systems A 10 point review of systems was performed and it is as noted above otherwise negative.   Patient Active Problem List   Diagnosis Date Noted   Acute bronchitis 05/12/2022   Chronic respiratory failure with hypoxia (HCC) 05/12/2022   Asthma 05/12/2022   Allergic reaction 05/11/2022   Acute respiratory failure with hypoxia (HCC) 01/31/2022   Congestive heart failure (HCC) 01/31/2022   Severe pulmonary hypertension (HCC) 01/31/2022   SOB (shortness of breath) 01/26/2022   Carotid artery disease (HCC) 12/03/2019   Moderate mitral insufficiency 09/24/2018   Atrial fibrillation (HCC) 08/14/2018   Mood disorder (HCC) 06/21/2017   Preventative health care 05/31/2016   Advance directive discussed with patient 05/31/2016   Aortic insufficiency 06/08/2011   GERD 04/20/2009   RAYNAUD'S SYNDROME, HX OF 04/20/2009   History of colonic polyps 01/07/2008   HYPERLIPIDEMIA 12/14/2006   Essential hypertension 12/14/2006   Allergic rhinitis due to pollen 12/14/2006   SYMPTOM, INCONTINENCE, MIXED, URGE/STRESS 12/14/2006    Social History   Tobacco Use   Smoking status: Never    Passive exposure: Past   Smokeless tobacco: Never  Substance Use Topics   Alcohol use: Yes    Comment: special events    Allergies  Allergen Reactions   Amlodipine Besy-Benazepril Hcl Swelling    Lotrel caused swelling of feet & facial swelling unilaterally   Amlodipine Besy-Benazepril Hcl Other (See Comments)   Lotrel [Amlodipine Besy-Benazepril Hcl]    Molds & Smuts Other (See Comments)   Wasp Venom Swelling    Allergic to wasps    Current Meds  Medication Sig   albuterol  (VENTOLIN HFA) 108 (90 Base) MCG/ACT inhaler Inhale 1-2 puffs into the lungs every 6 (six) hours as needed.   amiodarone (PACERONE) 200 MG tablet Take 0.5 tablets (100 mg total) by mouth daily.   amLODipine (NORVASC) 5 MG tablet TAKE 1 TABLET (5 MG TOTAL) BY MOUTH DAILY.   apixaban (ELIQUIS) 2.5 MG TABS tablet Take 1 tablet (2.5 mg total) by mouth 2 (two) times daily.   dapagliflozin propanediol (FARXIGA) 10 MG TABS tablet Take 1 tablet (10 mg total) by mouth daily before breakfast.   EPINEPHrine 0.3 mg/0.3 mL IJ SOAJ injection Inject 0.3 mg into the muscle as needed for anaphylaxis.   famotidine (PEPCID) 20 MG tablet Take 20 mg by mouth daily.   fexofenadine (ALLEGRA) 180 MG tablet Take 180 mg by mouth daily.   fluticasone furoate-vilanterol (BREO ELLIPTA) 100-25 MCG/ACT AEPB Inhale 1 puff into the lungs daily. (Patient taking differently: Inhale 1 puff into the lungs as needed.)   furosemide (LASIX) 20 MG tablet Take 1 tablet (20 mg total) by mouth daily.   glucosamine-chondroitin 500-400 MG tablet Take 1 tablet by mouth 2 (two) times daily. 1 pm   Krill Oil 500 MG CAPS Take by mouth.   mometasone (  NASONEX) 50 MCG/ACT nasal spray Place 1 spray into the nose 2 (two) times daily.   Multiple Vitamin (MULTIVITAMIN) capsule Take 1 capsule by mouth daily.   oxymetazoline (AFRIN) 0.05 % nasal spray Place 1 spray into both nostrils 2 (two) times daily.   potassium chloride SA (KLOR-CON M20) 20 MEQ tablet Take 1 tablet (20 mEq total) by mouth daily.   pravastatin (PRAVACHOL) 40 MG tablet TAKE 1 TABLET BY MOUTH EVERYDAY AT BEDTIME (Patient taking differently: Once a day with Lasix)   prednisoLONE acetate (PRED FORTE) 1 % ophthalmic suspension Apply to eye.   sertraline (ZOLOFT) 25 MG tablet TAKE 1 TABLET BY MOUTH EVERY DAY   sodium chloride (MURO 128) 5 % ophthalmic solution PLACE 1 DROP INTO RIGHT EYE 4 TIMES A DAY   valsartan (DIOVAN) 160 MG tablet TAKE 1 TABLET BY MOUTH 2 TIMES DAILY.   vitamin B-12  (CYANOCOBALAMIN) 500 MCG tablet Take 500 mcg by mouth daily.    Immunization History  Administered Date(s) Administered   Fluad Quad(high Dose 65+) 12/03/2020, 12/19/2021   Influenza Split 01/03/2011, 12/28/2011   Influenza Whole 12/14/2006, 01/07/2008, 12/16/2008, 12/15/2009   Influenza, High Dose Seasonal PF 12/25/2012, 03/15/2021   Influenza,inj,Quad PF,6+ Mos 11/28/2013, 12/04/2014, 12/08/2015, 11/27/2016, 11/30/2017, 12/05/2018, 11/29/2019   PFIZER(Purple Top)SARS-COV-2 Vaccination 03/19/2019, 04/12/2019, 06/24/2019, 12/22/2019   Pneumococcal Conjugate-13 05/28/2015   Pneumococcal Polysaccharide-23 05/01/2012, 03/15/2021   Td 04/20/2009        Objective:     BP (!) 168/60 (BP Location: Left Arm, Cuff Size: Normal)   Pulse 71   Temp 97.8 F (36.6 C)   Ht 5\' 3"  (1.6 m)   Wt 127 lb (57.6 kg)   SpO2 98%   BMI 22.50 kg/m   SpO2: 98 % O2 Device: None (Room air)  GENERAL: Patient is a well-developed, well-nourished elderly woman in no acute respiratory distress no tachypnea. She is fully ambulatory, no conversational dyspnea. HEAD: Normocephalic, atraumatic.  EYES: Pupils equal, round, reactive to light.  No scleral icterus.  MOUTH: Caps and crowns present.  Oral mucosa moist.  No thrush. NECK: Supple. No thyromegaly. Trachea midline. no JVD.  No adenopathy. PULMONARY: Good air entry bilaterally.  No adventitious sounds. CARDIOVASCULAR: S1 and S2.  Currently in NSR with CVR.  AR murmur 2/6, mitral regurgitation murmur 2/6 (stable).  ABDOMEN: Mildly protuberant otherwise benign. MUSCULOSKELETAL: No joint deformity, no clubbing, no lower extremity edema.  NEUROLOGIC: Grossly nonfocal. SKIN: Intact,warm,dry. PSYCH: Mood and behavior normal.      Assessment & Plan:     ICD-10-CM   1. Mild persistent asthma without complication  J45.30    Advised to take Breo daily Continue as needed albuterol No recent exacerbations    2. Severe pulmonary hypertension (HCC)  I27.20     Being managed at the advanced heart failure clinic Continue follow-up    3. Chronic diastolic congestive heart failure (HCC)  I50.32    This issue adds complexity to her management Follows with cardiology    4. Nocturnal hypoxia  G47.34    Continue oxygen at 2 L/min Compliant    5. Nocturnal hypoxemia due to pulmonary hypertension (HCC)  I27.29    G47.36    06/19/2022 sleep study negative for OSA Nocturnal hypoxemia present, nadir 82% Continue supplemental O2 @ 1 LPM Patient compliant    6. Moderate mitral insufficiency  I34.0    This issue adds complexity to her management Follows with cardiology    7. Perennial allergic rhinitis  J30.89  Discontinue Allegra Loratadine (Claritin) as needed daily Nasonex 1 spray to each nostril twice a day      Meds ordered this encounter  Medications   mometasone (NASONEX) 50 MCG/ACT nasal spray    Sig: Place 1 spray into the nose 2 (two) times daily.    Dispense:  17 g    Refill:  2   Will see the patient in follow-up in 3 to 4 months time she is to contact us prior to that time should any new difficulties arise.   Gailen Shelter, MD Advanced Bronchoscopy PCCM [facility]    *This note was dictated using voice recognition software/Dragon.  Despite best efforts to proofread, errors can occur which can change the meaning. Any transcriptional errors that result from this process are unintentional and may not be fully corrected at the time of dictation.

## 2022-09-19 ENCOUNTER — Encounter: Payer: Medicare Other | Admitting: *Deleted

## 2022-09-19 DIAGNOSIS — I272 Pulmonary hypertension, unspecified: Secondary | ICD-10-CM | POA: Diagnosis not present

## 2022-09-19 NOTE — Progress Notes (Signed)
Daily Session Note  Patient Details  Name: [name] MRN: [id number] Date of Birth: Jan 11, 1941 Referring Provider:   Flowsheet Row Pulmonary Rehab from 06/14/2022 in [facility] Cardiac and Pulmonary Rehab  Referring Provider Bensimhon       Encounter Date: 09/19/2022  Check In:  Session Check In - 09/19/22 1039       Check-In   Supervising physician immediately available to respond to emergencies See telemetry face sheet for immediately available ER MD    Location [facility]-Cardiac & Pulmonary Rehab    Staff Present Cora Collum, RN, BSN, CCRP;Other   Rory Percy MS, Exercies Physiologist; Maxon Conetta BS Exercise Physiologist   Virtual Visit No    Medication changes reported     No    Fall or balance concerns reported    No    Warm-up and Cool-down Performed on first and last piece of equipment    Resistance Training Performed Yes    VAD Patient? No    PAD/SET Patient? No      Pain Assessment   Currently in Pain? No/denies                Social History   Tobacco Use  Smoking Status Never   Passive exposure: Past  Smokeless Tobacco Never    Goals Met:  Proper associated with RPD/PD & O2 Sat Independence with exercise equipment Exercise tolerated well No report of concerns or symptoms today  Goals Unmet:  Not Applicable  Comments: Pt able to follow exercise prescription today without complaint.  Will continue to monitor for progression.    Dr. Bethann Punches is Medical Director for [facility] Cardiac Rehabilitation.  Dr. Vida Rigger is Medical Director for [facility] Pulmonary Rehabilitation.

## 2022-09-21 ENCOUNTER — Encounter: Payer: Medicare Other | Admitting: *Deleted

## 2022-09-21 DIAGNOSIS — I272 Pulmonary hypertension, unspecified: Secondary | ICD-10-CM | POA: Diagnosis not present

## 2022-09-21 NOTE — Progress Notes (Signed)
Daily Session Note  Patient Details  Name: [name] MRN: [id number] Date of Birth: [birth year] Referring Provider:   Flowsheet Row Pulmonary Rehab from 06/14/2022 in [facility] Cardiac and Pulmonary Rehab  Referring Provider Bensimhon       Encounter Date: 09/21/2022  Check In:  Session Check In - 09/21/22 1105       Check-In   Supervising physician immediately available to respond to emergencies See telemetry face sheet for immediately available ER MD    Location [facility]-Cardiac & Pulmonary Rehab    Staff Present Lanny Hurst, RN, [place];Other   Rory Percy, MS / Jetta Lout, BS   Virtual Visit No    Medication changes reported     No    Fall or balance concerns reported    No    Warm-up and Cool-down Performed on first and last piece of equipment    Resistance Training Performed Yes    VAD Patient? No    PAD/SET Patient? No      Pain Assessment   Currently in Pain? No/denies                Social History   Tobacco Use  Smoking Status Never   Passive exposure: Past  Smokeless Tobacco Never    Goals Met:  Independence with exercise equipment Exercise tolerated well No report of concerns or symptoms today Strength training completed today  Goals Unmet:  Not Applicable  Comments: Pt able to follow exercise prescription today without complaint.  Will continue to monitor for progression.    Dr. Bethann Punches is Medical Director for [facility] Cardiac Rehabilitation.  Dr. Vida Rigger is Medical Director for [facility] Pulmonary Rehabilitation.

## 2022-09-26 ENCOUNTER — Encounter: Payer: Medicare Other | Admitting: *Deleted

## 2022-09-26 DIAGNOSIS — I272 Pulmonary hypertension, unspecified: Secondary | ICD-10-CM

## 2022-09-26 NOTE — Progress Notes (Signed)
Daily Session Note  Patient Details  Name: Rodneshia Petak MRN: [id number] Date of Birth: 1940-04-04 Referring Provider:   Flowsheet Row Pulmonary Rehab from 06/14/2022 in [facility] Cardiac and Pulmonary Rehab  Referring Provider Bensimhon       Encounter Date: 09/26/2022  Check In:  Session Check In - 09/26/22 1025       Check-In   Supervising physician immediately available to respond to emergencies See telemetry face sheet for immediately available ER MD    Location [facility]-Cardiac & Pulmonary Rehab    Staff Present Cora Collum, RN, BSN, CCRP;Meredith Jewel Baize, RN BSN;Other   Rory Percy MS Exercise physiologist, Maxon Conetta BS Exercise Physiologist   Virtual Visit No    Medication changes reported     No    Fall or balance concerns reported    No    Warm-up and Cool-down Performed on first and last piece of equipment    Resistance Training Performed No    VAD Patient? No    PAD/SET Patient? No      Pain Assessment   Currently in Pain? No/denies                Social History   Tobacco Use  Smoking Status Never   Passive exposure: Past  Smokeless Tobacco Never    Goals Met:  Proper associated with RPD/PD & O2 Sat Independence with exercise equipment Exercise tolerated well No report of concerns or symptoms today  Goals Unmet:  Not Applicable  Comments: Pt able to follow exercise prescription today without complaint.  Will continue to monitor for progression.    Dr. Bethann Punches is Medical Director for [facility] Cardiac Rehabilitation.  Dr. Vida Rigger is Medical Director for [facility] Pulmonary Rehabilitation.

## 2022-09-28 ENCOUNTER — Encounter: Payer: Medicare Other | Attending: Internal Medicine | Admitting: *Deleted

## 2022-09-28 DIAGNOSIS — I272 Pulmonary hypertension, unspecified: Secondary | ICD-10-CM | POA: Insufficient documentation

## 2022-09-28 NOTE — Progress Notes (Signed)
Daily Session Note  Patient Details  Name: [name] MRN: [id number] Date of Birth: [birth year] Referring Provider:   Flowsheet Row Pulmonary Rehab from 06/14/2022 in [facility] Cardiac and Pulmonary Rehab  Referring Provider Bensimhon       Encounter Date: 09/28/2022  Check In:  Session Check In - 09/28/22 1010       Check-In   Supervising physician immediately available to respond to emergencies See telemetry face sheet for immediately available ER MD    Location [facility]-Cardiac & Pulmonary Rehab    Staff Present Lanny Hurst, RN, ADN;Joseph Reino Kent, RCP,RRT,BSRT;Other   Max [place], EP BS / Girtha Rm, EP MS   Virtual Visit No    Medication changes reported     No    Fall or balance concerns reported    No    Warm-up and Cool-down Performed on first and last piece of equipment    Resistance Training Performed Yes    VAD Patient? No    PAD/SET Patient? No      Pain Assessment   Currently in Pain? No/denies                Social History   Tobacco Use  Smoking Status Never   Passive exposure: Past  Smokeless Tobacco Never    Goals Met:  Independence with exercise equipment Exercise tolerated well No report of concerns or symptoms today Strength training completed today  Goals Unmet:  Not Applicable  Comments: Pt able to follow exercise prescription today without complaint.  Will continue to monitor for progression.    Dr. Bethann Punches is Medical Director for [facility] Cardiac Rehabilitation.  Dr. Vida Rigger is Medical Director for [facility] Pulmonary Rehabilitation.

## 2022-10-02 ENCOUNTER — Encounter: Payer: Self-pay | Admitting: Internal Medicine

## 2022-10-03 ENCOUNTER — Encounter: Payer: Medicare Other | Admitting: *Deleted

## 2022-10-03 VITALS — Ht 63.0 in | Wt 125.9 lb

## 2022-10-03 DIAGNOSIS — J3081 Allergic rhinitis due to animal (cat) (dog) hair and dander: Secondary | ICD-10-CM | POA: Diagnosis not present

## 2022-10-03 DIAGNOSIS — I272 Pulmonary hypertension, unspecified: Secondary | ICD-10-CM

## 2022-10-03 DIAGNOSIS — J3089 Other allergic rhinitis: Secondary | ICD-10-CM | POA: Diagnosis not present

## 2022-10-03 DIAGNOSIS — J301 Allergic rhinitis due to pollen: Secondary | ICD-10-CM | POA: Diagnosis not present

## 2022-10-03 NOTE — Patient Instructions (Addendum)
Discharge Patient Instructions  Patient Details  Name: [name] MRN: [id number] Date of Birth: Jul 08, 1940 Referring Provider:  Karie Schwalbe, MD   Number of Visits: 53  Reason for Discharge:  Patient reached a stable level of exercise. Patient independent in their exercise. Patient has met program and personal goals.    Diagnosis:  Pulmonary HTN  Initial Exercise Prescription:  Initial Exercise Prescription - 06/14/22 1300       Date of Initial Exercise RX and Referring Provider   Date 06/14/22    Referring Provider Bensimhon      Oxygen   Maintain Oxygen Saturation 88% or higher      Recumbant Bike   Level 2    RPM 50    Watts 15    Minutes 15      NuStep   Level 2    SPM 80    Minutes 15    METs 1.97      REL-XR   Level 2    Speed 50    Minutes 15    METs 1.97      Track   Laps 30    Minutes 15    METs 2.63      Prescription Details   Frequency (times per week) 2    Duration Progress to 30 minutes of continuous aerobic without signs/symptoms of physical distress      Intensity   THRR 40-80% of Max Heartrate 97-124    Ratings of Perceived Exertion 11-13    Perceived Dyspnea 0-4      Progression   Progression Continue to progress workloads to maintain intensity without signs/symptoms of physical distress.      Resistance Training   Training Prescription Yes    Weight 2    Reps 10-15             Discharge Exercise Prescription (Final Exercise Prescription Changes):  Exercise Prescription Changes - 09/28/22 1100       Response to Exercise   Blood Pressure (Admit) 140/52    Blood Pressure (Exit) 128/50    Heart Rate (Admit) 67 bpm    Heart Rate (Exercise) 88 bpm    Heart Rate (Exit) 65 bpm    Oxygen Saturation (Admit) 95 %    Oxygen Saturation (Exercise) 90 %    Oxygen Saturation (Exit) 96 %    Rating of Perceived Exertion (Exercise) 15    Perceived Dyspnea (Exercise) 0    Symptoms none    Duration Continue with 30  min of aerobic exercise without signs/symptoms of physical distress.    Intensity THRR unchanged      Progression   Progression Continue to progress workloads to maintain intensity without signs/symptoms of physical distress.    Average METs 3.45      Resistance Training   Training Prescription Yes    Weight 2 lb    Reps 10-15      Interval Training   Interval Training No      Recumbant Bike   Level 2    Watts 19    Minutes 15      REL-XR   Level 1    Minutes 15    METs 4.5      Track   Laps 47    Minutes 15    METs 3.56      Home Exercise Plan   Plans to continue exercise at Home (comment)   walking   Frequency Add 2 additional days to program exercise sessions.  Initial Home Exercises Provided 07/04/22      Oxygen   Maintain Oxygen Saturation 88% or higher             Functional Capacity:  6 Minute Walk     Row Name 06/14/22 1321 10/03/22 1013       6 Minute Walk   Phase Initial Discharge    Distance 1080 feet 1555 feet    Distance % Change -- 44 %    Distance Feet Change -- 475 ft    Walk Time 6 minutes 6 minutes    # of Rest Breaks 0 0    MPH 2.05 2.95    METS 1.97 2.94    RPE 12 13    Perceived Dyspnea  0 0    VO2 Peak 6.88 10.29    Symptoms No No    Resting HR 71 bpm 69 bpm    Resting BP 138/82 138/50    Resting Oxygen Saturation  96 % 95 %    Exercise Oxygen Saturation  during 6 min walk 94 % 90 %    Max Ex. HR 80 bpm 102 bpm    Max Ex. BP 160/62 144/60    2 Minute Post BP 142/72 140/56      Interval HR   1 Minute HR 77 87    2 Minute HR 78 96    3 Minute HR 78 96    4 Minute HR 79 100    5 Minute HR 79 102    6 Minute HR 80 100    2 Minute Post HR 69 75    Interval Heart Rate? Yes Yes      Interval Oxygen   Interval Oxygen? Yes Yes    Baseline Oxygen Saturation % 96 % 95 %    1 Minute Oxygen Saturation % 97 % 95 %    1 Minute Liters of Oxygen 0 L 0 L  RA    2 Minute Oxygen Saturation % 94 % 91 %    2 Minute Liters of  Oxygen 0 L 0 L    3 Minute Oxygen Saturation % 94 % 92 %    3 Minute Liters of Oxygen 0 L 0 L    4 Minute Oxygen Saturation % 95 % 91 %    4 Minute Liters of Oxygen 0 L 0 L    5 Minute Oxygen Saturation % 96 % 90 %    5 Minute Liters of Oxygen 0 L 0 L    6 Minute Oxygen Saturation % 97 % 93 %    6 Minute Liters of Oxygen 0 L 0 L    2 Minute Post Oxygen Saturation % 98 % 96 %    2 Minute Post Liters of Oxygen 0 L 0 L            Nutrition & Weight - Outcomes:  Pre Biometrics - 06/14/22 1346       Pre Biometrics   Height 5\' 3"  (1.6 m)    Weight 126 lb 11.2 oz (57.5 kg)    Waist Circumference 32.5 inches    Hip Circumference 37 inches    Waist to Hip Ratio 0.88 %    BMI (Calculated) 22.45    Single Leg Stand 6.81 seconds             Post Biometrics - 10/03/22 1018        Post  Biometrics   Height 5\' 3"  (1.6 m)  Weight 125 lb 14.4 oz (57.1 kg)    Waist Circumference 31 inches    Hip Circumference 35.5 inches    Waist to Hip Ratio 0.87 %    BMI (Calculated) 22.31    Single Leg Stand 8.88 seconds             Nutrition:  Nutrition Therapy & Goals - 06/14/22 1442       Intervention Plan   Intervention Prescribe, educate and counsel regarding individualized specific dietary modifications aiming towards targeted core components such as weight, hypertension, lipid management, diabetes, heart failure and other comorbidities.    Expected Outcomes Short Term Goal: Understand basic principles of dietary content, such as calories, fat, sodium, cholesterol and nutrients.;Short Term Goal: A plan has been developed with personal nutrition goals set during dietitian appointment.;Long Term Goal: Adherence to prescribed nutrition plan.

## 2022-10-03 NOTE — Progress Notes (Signed)
Daily Session Note  Patient Details  Name: [name] MRN: [id number] Date of Birth: 20-Sep-1940 Referring Provider:   Flowsheet Row Pulmonary Rehab from 06/14/2022 in [facility] Cardiac and Pulmonary Rehab  Referring Provider Bensimhon       Encounter Date: 10/03/2022  Check In:  Session Check In - 10/03/22 1039       Check-In   Supervising physician immediately available to respond to emergencies See telemetry face sheet for immediately available ER MD    Location [facility]-Cardiac & Pulmonary Rehab    Staff Present Cora Collum, RN, BSN, CCRP;Other   [employer], Exercise Physiologist, Rory Percy MS, Exercise Physiologist   Virtual Visit No    Medication changes reported     No    Fall or balance concerns reported    No    Warm-up and Cool-down Performed on first and last piece of equipment    Resistance Training Performed No    VAD Patient? No    PAD/SET Patient? No      Pain Assessment   Currently in Pain? No/denies              6 Minute Walk     Row Name 06/14/22 1321 10/03/22 1013       6 Minute Walk   Phase Initial Discharge    Distance 1080 feet 1555 feet    Distance % Change -- 44 %    Distance Feet Change -- 475 ft    Walk Time 6 minutes 6 minutes    # of Rest Breaks 0 0    MPH 2.05 2.95    METS 1.97 2.94    RPE 12 13    Perceived Dyspnea  0 0    VO2 Peak 6.88 10.29    Symptoms No No    Resting HR 71 bpm 69 bpm    Resting BP 138/82 138/50    Resting Oxygen Saturation  96 % 95 %    Exercise Oxygen Saturation  during 6 min walk 94 % 90 %    Max Ex. HR 80 bpm 102 bpm    Max Ex. BP 160/62 144/60    2 Minute Post BP 142/72 140/56      Interval HR   1 Minute HR 77 87    2 Minute HR 78 96    3 Minute HR 78 96    4 Minute HR 79 100    5 Minute HR 79 102    6 Minute HR 80 100    2 Minute Post HR 69 75    Interval Heart Rate? Yes Yes      Interval Oxygen   Interval Oxygen? Yes Yes    Baseline Oxygen Saturation % 96 % 95 %    1 Minute Oxygen  Saturation % 97 % 95 %    1 Minute Liters of Oxygen 0 L 0 L  RA    2 Minute Oxygen Saturation % 94 % 91 %    2 Minute Liters of Oxygen 0 L 0 L    3 Minute Oxygen Saturation % 94 % 92 %    3 Minute Liters of Oxygen 0 L 0 L    4 Minute Oxygen Saturation % 95 % 91 %    4 Minute Liters of Oxygen 0 L 0 L    5 Minute Oxygen Saturation % 96 % 90 %    5 Minute Liters of Oxygen 0 L 0 L    6 Minute  Oxygen Saturation % 97 % 93 %    6 Minute Liters of Oxygen 0 L 0 L    2 Minute Post Oxygen Saturation % 98 % 96 %    2 Minute Post Liters of Oxygen 0 L 0 L                Social History   Tobacco Use  Smoking Status Never   Passive exposure: Past  Smokeless Tobacco Never    Goals Met:  Proper associated with RPD/PD & O2 Sat Independence with exercise equipment Exercise tolerated well No report of concerns or symptoms today  Goals Unmet:  Not Applicable  Comments: Pt able to follow exercise prescription today without complaint.  Will continue to monitor for progression.    Dr. Bethann Punches is Medical Director for [facility] Cardiac Rehabilitation.  Dr. Vida Rigger is Medical Director for [facility] Pulmonary Rehabilitation.

## 2022-10-04 ENCOUNTER — Encounter: Payer: Self-pay | Admitting: *Deleted

## 2022-10-04 DIAGNOSIS — I272 Pulmonary hypertension, unspecified: Secondary | ICD-10-CM

## 2022-10-04 NOTE — Progress Notes (Signed)
Pulmonary Individual Treatment Plan  Patient Details  Name: [name] MRN: [id number] Date of Birth: 1940/09/29 Referring Provider:   Flowsheet Row Pulmonary Rehab from 06/14/2022 in [facility] Cardiac and Pulmonary Rehab  Referring Provider Bensimhon       Initial Encounter Date:  Flowsheet Row Pulmonary Rehab from 06/14/2022 in [facility] Cardiac and Pulmonary Rehab  Date 06/14/22       Visit Diagnosis: Pulmonary HTN (HCC)  Patient's Home Medications on Admission:  Current Outpatient Medications:    albuterol (VENTOLIN HFA) 108 (90 Base) MCG/ACT inhaler, Inhale 1-2 puffs into the lungs every 6 (six) hours as needed., Disp: 8 g, Rfl: 2   amiodarone (PACERONE) 200 MG tablet, Take 0.5 tablets (100 mg total) by mouth daily., Disp: 45 tablet, Rfl: 1   amLODipine (NORVASC) 5 MG tablet, TAKE 1 TABLET (5 MG TOTAL) BY MOUTH DAILY., Disp: 90 tablet, Rfl: 0   apixaban (ELIQUIS) 2.5 MG TABS tablet, Take 1 tablet (2.5 mg total) by mouth 2 (two) times daily., Disp: 60 tablet, Rfl: 3   azelastine (ASTELIN) 0.1 % nasal spray, Place 1 spray into both nostrils 2 (two) times daily. Use in each nostril as directed (Patient not taking: Reported on 09/18/2022), Disp: 30 mL, Rfl: 5   benzonatate (TESSALON) 200 MG capsule, Take 1 capsule (200 mg total) by mouth 3 (three) times daily as needed. (Patient not taking: Reported on 08/17/2022), Disp: 45 capsule, Rfl: 1   cetirizine (ZYRTEC) 10 MG tablet, Take 10 mg by mouth daily. (Patient not taking: Reported on 08/17/2022), Disp: , Rfl:    chlorpheniramine (CHLOR-TRIMETON) 4 MG tablet, Take 4 mg by mouth daily. (Patient not taking: Reported on 08/17/2022), Disp: , Rfl:    dapagliflozin propanediol (FARXIGA) 10 MG TABS tablet, Take 1 tablet (10 mg total) by mouth daily before breakfast., Disp: 30 tablet, Rfl: 3   doxycycline (VIBRA-TABS) 100 MG tablet, Take 1 tablet (100 mg total) by mouth 2 (two) times daily. (Patient not taking: Reported on 08/17/2022), Disp: 14 tablet,  Rfl: 0   EPINEPHrine 0.3 mg/0.3 mL IJ SOAJ injection, Inject 0.3 mg into the muscle as needed for anaphylaxis., Disp: , Rfl:    famotidine (PEPCID) 20 MG tablet, Take 20 mg by mouth daily., Disp: , Rfl:    fexofenadine (ALLEGRA) 180 MG tablet, Take 180 mg by mouth daily., Disp: , Rfl:    fluticasone furoate-vilanterol (BREO ELLIPTA) 100-25 MCG/ACT AEPB, Inhale 1 puff into the lungs daily. (Patient taking differently: Inhale 1 puff into the lungs as needed.), Disp: 30 each, Rfl: 5   furosemide (LASIX) 20 MG tablet, Take 1 tablet (20 mg total) by mouth daily., Disp: 180 tablet, Rfl: 2   glucosamine-chondroitin 500-400 MG tablet, Take 1 tablet by mouth 2 (two) times daily. 1 pm, Disp: , Rfl:    Krill Oil 500 MG CAPS, Take by mouth., Disp: , Rfl:    mometasone (NASONEX) 50 MCG/ACT nasal spray, Place 1 spray into the nose 2 (two) times daily., Disp: 17 g, Rfl: 2   Multiple Vitamin (MULTIVITAMIN) capsule, Take 1 capsule by mouth daily., Disp: , Rfl:    oxymetazoline (AFRIN) 0.05 % nasal spray, Place 1 spray into both nostrils 2 (two) times daily., Disp: , Rfl:    potassium chloride SA (KLOR-CON M20) 20 MEQ tablet, Take 1 tablet (20 mEq total) by mouth daily., Disp: 90 tablet, Rfl: 3   pravastatin (PRAVACHOL) 40 MG tablet, TAKE 1 TABLET BY MOUTH EVERYDAY AT BEDTIME (Patient taking differently: Once a day with Lasix),  Disp: 90 tablet, Rfl: 3   prednisoLONE acetate (PRED FORTE) 1 % ophthalmic suspension, Apply to eye., Disp: , Rfl:    predniSONE (DELTASONE) 20 MG tablet, Take 1 tablet (20 mg total) by mouth daily with breakfast. (Patient not taking: Reported on 08/17/2022), Disp: 5 tablet, Rfl: 0   sertraline (ZOLOFT) 25 MG tablet, TAKE 1 TABLET BY MOUTH EVERY DAY, Disp: 100 tablet, Rfl: 3   sodium chloride (MURO 128) 5 % ophthalmic solution, PLACE 1 DROP INTO RIGHT EYE 4 TIMES A DAY, Disp: , Rfl:    valsartan (DIOVAN) 160 MG tablet, TAKE 1 TABLET BY MOUTH 2 TIMES DAILY., Disp: 180 tablet, Rfl: 0   vitamin  B-12 (CYANOCOBALAMIN) 500 MCG tablet, Take 500 mcg by mouth daily., Disp: , Rfl:   Past Medical History: Past Medical History:  Diagnosis Date   Adenomatous colon polyp 2005   Allergy    perennial ; shots from Dr Corinda Gubler   Aortic insufficiency    Hyperlipidemia    Hypertension    Murmur    a.  04/2011 Echo:  EF 55-60%, mild-mod ai/mr   Squamous cell carcinoma of skin 08/05/2020   left pretibia EDC   Uterine cancer (HCC)     Tobacco Use: Social History   Tobacco Use  Smoking Status Never   Passive exposure: Past  Smokeless Tobacco Never    Labs: Review Flowsheet  More data exists      Latest Ref Rng & Units 05/31/2016 06/21/2017 08/14/2018 12/03/2020 12/19/2021  Labs for ITP Cardiac and Pulmonary Rehab  Cholestrol 0 - 200 mg/dL 161  096  045  409  811   LDL (calc) 0 - 99 mg/dL 91  77  71  60  77   HDL-C >39.00 mg/dL 91.47  82.95  62.13  08.65  50.20   Trlycerides 0.0 - 149.0 mg/dL 78.4  69.6  29.5  28.4  68.0   Hemoglobin A1c 4.6 - 6.5 % 5.8  5.8  5.8  - -    Details             Pulmonary Assessment Scores:  Pulmonary Assessment Scores     Row Name 06/14/22 1444         ADL UCSD   ADL Phase Entry     SOB Score total 7     Rest 0     Walk 0     Stairs 1     Bath 0     Dress 0     Shop 0       CAT Score   CAT Score 5       mMRC Score   mMRC Score 2              UCSD: Self-administered rating of dyspnea associated with activities of daily living (ADLs) 6-point scale (0 = "not at all" to 5 = "maximal or unable to do because of breathlessness")  Scoring Scores range from 0 to 120.  Minimally important difference is 5 units  CAT: CAT can identify the health impairment of COPD patients and is better correlated with disease progression.  CAT has a scoring range of zero to 40. The CAT score is classified into four groups of low (less than 10), medium (10 - 20), high (21-30) and very high (31-40) based on the impact level of disease on health status. A  CAT score over 10 suggests significant symptoms.  A worsening CAT score could be explained by an exacerbation, poor medication  adherence, poor inhaler technique, or progression of COPD or comorbid conditions.  CAT MCID is 2 points  mMRC: mMRC (Modified Medical Research Council) Dyspnea Scale is used to assess the degree of baseline functional disability in patients of respiratory disease due to dyspnea. No minimal important difference is established. A decrease in score of 1 point or greater is considered a positive change.   Pulmonary Function Assessment:   Exercise Target Goals: Exercise Program Goal: Individual exercise prescription set using results from initial 6 min walk test and THRR while considering  patient's activity barriers and safety.   Exercise Prescription Goal: Initial exercise prescription builds to 30-45 minutes a day of aerobic activity, 2-3 days per week.  Home exercise guidelines will be given to patient during program as part of exercise prescription that the participant will acknowledge.  Education: Aerobic Exercise: - Group verbal and visual presentation on the components of exercise prescription. Introduces F.I.T.T principle from ACSM for exercise prescriptions.  Reviews F.I.T.T. principles of aerobic exercise including progression. Written material given at graduation.   Education: Resistance Exercise: - Group verbal and visual presentation on the components of exercise prescription. Introduces F.I.T.T principle from ACSM for exercise prescriptions  Reviews F.I.T.T. principles of resistance exercise including progression. Written material given at graduation.    Education: Exercise & Equipment Safety: - Individual verbal instruction and demonstration of equipment use and safety with use of the equipment. Flowsheet Row Pulmonary Rehab from 08/10/2022 in [facility] Cardiac and Pulmonary Rehab  Date 06/14/22  Educator [facility]  Instruction Review Code 1- Verbalizes  Understanding       Education: Exercise Physiology & General Exercise Guidelines: - Group verbal and written instruction with models to review the exercise physiology of the cardiovascular system and associated critical values. Provides general exercise guidelines with specific guidelines to those with heart or lung disease.    Education: Flexibility, Balance, Mind/Body Relaxation: - Group verbal and visual presentation with interactive activity on the components of exercise prescription. Introduces F.I.T.T principle from ACSM for exercise prescriptions. Reviews F.I.T.T. principles of flexibility and balance exercise training including progression. Also discusses the mind body connection.  Reviews various relaxation techniques to help reduce and manage stress (i.e. Deep breathing, progressive muscle relaxation, and visualization). Balance handout provided to take home. Written material given at graduation.   Activity Barriers & Risk Stratification:  Activity Barriers & Cardiac Risk Stratification - 06/14/22 1342       Activity Barriers & Cardiac Risk Stratification   Activity Barriers Back Problems             6 Minute Walk:  6 Minute Walk     Row Name 06/14/22 1321 10/03/22 1013       6 Minute Walk   Phase Initial Discharge    Distance 1080 feet 1555 feet    Distance % Change -- 44 %    Distance Feet Change -- 475 ft    Walk Time 6 minutes 6 minutes    # of Rest Breaks 0 0    MPH 2.05 2.95    METS 1.97 2.94    RPE 12 13    Perceived Dyspnea  0 0    VO2 Peak 6.88 10.29    Symptoms No No    Resting HR 71 bpm 69 bpm    Resting BP 138/82 138/50    Resting Oxygen Saturation  96 % 95 %    Exercise Oxygen Saturation  during 6 min walk 94 % 90 %  Max Ex. HR 80 bpm 102 bpm    Max Ex. BP 160/62 144/60    2 Minute Post BP 142/72 140/56      Interval HR   1 Minute HR 77 87    2 Minute HR 78 96    3 Minute HR 78 96    4 Minute HR 79 100    5 Minute HR 79 102    6  Minute HR 80 100    2 Minute Post HR 69 75    Interval Heart Rate? Yes Yes      Interval Oxygen   Interval Oxygen? Yes Yes    Baseline Oxygen Saturation % 96 % 95 %    1 Minute Oxygen Saturation % 97 % 95 %    1 Minute Liters of Oxygen 0 L 0 L  RA    2 Minute Oxygen Saturation % 94 % 91 %    2 Minute Liters of Oxygen 0 L 0 L    3 Minute Oxygen Saturation % 94 % 92 %    3 Minute Liters of Oxygen 0 L 0 L    4 Minute Oxygen Saturation % 95 % 91 %    4 Minute Liters of Oxygen 0 L 0 L    5 Minute Oxygen Saturation % 96 % 90 %    5 Minute Liters of Oxygen 0 L 0 L    6 Minute Oxygen Saturation % 97 % 93 %    6 Minute Liters of Oxygen 0 L 0 L    2 Minute Post Oxygen Saturation % 98 % 96 %    2 Minute Post Liters of Oxygen 0 L 0 L            Oxygen Initial Assessment:  Oxygen Initial Assessment - 06/14/22 1444       Home Oxygen   Home Oxygen Device None    Sleep Oxygen Prescription Continuous    Liters per minute 2    Home Exercise Oxygen Prescription None    Home Resting Oxygen Prescription None      Initial 6 min Walk   Oxygen Used None      Program Oxygen Prescription   Program Oxygen Prescription None      Intervention   Short Term Goals To learn and understand importance of monitoring SPO2 with pulse oximeter and demonstrate accurate use of the pulse oximeter.;To learn and understand importance of maintaining oxygen saturations>88%;To learn and demonstrate proper pursed lip breathing techniques or other breathing techniques. ;To learn and demonstrate proper use of respiratory medications    Long  Term Goals Verbalizes importance of monitoring SPO2 with pulse oximeter and return demonstration;Maintenance of O2 saturations>88%;Exhibits proper breathing techniques, such as pursed lip breathing or other method taught during program session;Compliance with respiratory medication;Demonstrates proper use of MDI's             Oxygen Re-Evaluation:  Oxygen Re-Evaluation      Row Name 06/20/22 1003 07/04/22 1358 07/27/22 1019 09/14/22 1016       Program Oxygen Prescription   Program Oxygen Prescription -- None None None      Home Oxygen   Home Oxygen Device -- None Home Concentrator Home Concentrator    Sleep Oxygen Prescription -- Continuous Continuous Continuous    Liters per minute -- 2 1 1     Home Exercise Oxygen Prescription -- None None None    Home Resting Oxygen Prescription -- None None None  Compliance with Home Oxygen Use -- -- Yes Yes      Goals/Expected Outcomes   Short Term Goals -- To learn and understand importance of monitoring SPO2 with pulse oximeter and demonstrate accurate use of the pulse oximeter.;To learn and understand importance of maintaining oxygen saturations>88%;To learn and demonstrate proper pursed lip breathing techniques or other breathing techniques. ;To learn and demonstrate proper use of respiratory medications To learn and understand importance of maintaining oxygen saturations>88%;To learn and understand importance of monitoring SPO2 with pulse oximeter and demonstrate accurate use of the pulse oximeter. To learn and demonstrate proper pursed lip breathing techniques or other breathing techniques.     Long  Term Goals -- Verbalizes importance of monitoring SPO2 with pulse oximeter and return demonstration;Maintenance of O2 saturations>88%;Exhibits proper breathing techniques, such as pursed lip breathing or other method taught during program session;Compliance with respiratory medication;Demonstrates proper use of MDI's Verbalizes importance of monitoring SPO2 with pulse oximeter and return demonstration;Maintenance of O2 saturations>88% Exhibits proper breathing techniques, such as pursed lip breathing or other method taught during program session    Comments Reviewed PLB technique with pt.  Talked about how it works and it's importance in maintaining their exercise saturations. Arrayah states she is compliant with using  her sleep oxygen. She does have a pulse ox to monitor her HR and O2 levels and is aware to make sure her oxygen levels stays above 88%. We talked more about PLB today and she is going to practice it more. She experiences a lot of sinus issues which impacts her SOB at times, but continues to take medications for it to help relieve some symptoms. She has a pulse oximeter to check her oxygen saturation at home. Informed him/her where to get one and explained why it is important to have one. Reviewed that oxygen saturations should be 88 percent and above. Diaphragmatic and PLB breathing explained and performed with patient. Patient has a better understanding of how to do these exercises to help with breathing performance and relaxation. Patient performed breathing techniques adequately and to practice further at home.    Goals/Expected Outcomes Short: Become more profiecient at using PLB. Long: Become independent at using PLB. Short: Continue to monitor O2 saturations closely, notify of any concerns Long: Continue to become proficient at using PLB and monitoring oxygen Short: monitor oxygen at home with exertion. Long: maintain oxygen saturations above 88 percent independently. Short: practice PLB and diaphragmatic breathing at home. Long: Use PLB and diaphragmatic breathing independently post [employer].             Oxygen Discharge (Final Oxygen Re-Evaluation):  Oxygen Re-Evaluation - 09/14/22 1016       Program Oxygen Prescription   Program Oxygen Prescription None      Home Oxygen   Home Oxygen Device Home Concentrator    Sleep Oxygen Prescription Continuous    Liters per minute 1    Home Exercise Oxygen Prescription None    Home Resting Oxygen Prescription None    Compliance with Home Oxygen Use Yes      Goals/Expected Outcomes   Short Term Goals To learn and demonstrate proper pursed lip breathing techniques or other breathing techniques.     Long  Term Goals Exhibits proper breathing  techniques, such as pursed lip breathing or other method taught during program session    Comments Diaphragmatic and PLB breathing explained and performed with patient. Patient has a better understanding of how to do these exercises to help with breathing performance  and relaxation. Patient performed breathing techniques adequately and to practice further at home.    Goals/Expected Outcomes Short: practice PLB and diaphragmatic breathing at home. Long: Use PLB and diaphragmatic breathing independently post [employer].             Initial Exercise Prescription:  Initial Exercise Prescription - 06/14/22 1300       Date of Initial Exercise RX and Referring Provider   Date 06/14/22    Referring Provider Bensimhon      Oxygen   Maintain Oxygen Saturation 88% or higher      Recumbant Bike   Level 2    RPM 50    Watts 15    Minutes 15      NuStep   Level 2    SPM 80    Minutes 15    METs 1.97      REL-XR   Level 2    Speed 50    Minutes 15    METs 1.97      Track   Laps 30    Minutes 15    METs 2.63      Prescription Details   Frequency (times per week) 2    Duration Progress to 30 minutes of continuous aerobic without signs/symptoms of physical distress      Intensity   THRR 40-80% of Max Heartrate 97-124    Ratings of Perceived Exertion 11-13    Perceived Dyspnea 0-4      Progression   Progression Continue to progress workloads to maintain intensity without signs/symptoms of physical distress.      Resistance Training   Training Prescription Yes    Weight 2    Reps 10-15             Perform Capillary Blood Glucose checks as needed.  Exercise Prescription Changes:   Exercise Prescription Changes     Row Name 06/14/22 1300 07/03/22 1200 07/04/22 1100 07/17/22 1400 08/01/22 0700     Response to Exercise   Blood Pressure (Admit) 138/82 114/58 -- 130/62 138/60   Blood Pressure (Exercise) 160/62 126/58 -- 134/62 158/60   Blood Pressure (Exit) 142/72  118/60 -- 120/58 132/58   Heart Rate (Admit) 71 bpm 65 bpm -- 71 bpm 63 bpm   Heart Rate (Exercise) 80 bpm 98 bpm -- 98 bpm 80 bpm   Heart Rate (Exit) 69 bpm 58 bpm -- 72 bpm 65 bpm   Oxygen Saturation (Admit) 96 % 97 % -- 94 % 92 %   Oxygen Saturation (Exercise) 94 % 91 % -- 90 % 97 %   Oxygen Saturation (Exit) 98 % 94 % -- 94 % 97 %   Rating of Perceived Exertion (Exercise) 12 13 -- 13 13   Perceived Dyspnea (Exercise) 0 2 -- 2 --   Symptoms none SOB -- SOB none   Comments 6 MWT results 2nd full week of exercise -- -- --   Duration -- Progress to 30 minutes of  aerobic without signs/symptoms of physical distress -- Continue with 30 min of aerobic exercise without signs/symptoms of physical distress. Continue with 30 min of aerobic exercise without signs/symptoms of physical distress.   Intensity -- THRR unchanged -- THRR unchanged THRR unchanged     Progression   Progression -- Continue to progress workloads to maintain intensity without signs/symptoms of physical distress. -- Continue to progress workloads to maintain intensity without signs/symptoms of physical distress. Continue to progress workloads to maintain intensity without signs/symptoms of physical distress.  Average METs -- 2.56 -- 2.3 2.69     Resistance Training   Training Prescription -- Yes -- Yes Yes   Weight -- 2 lb -- 2 lb 3 lb   Reps -- 10-15 -- 10-15 10-15     Interval Training   Interval Training -- No -- No No     NuStep   Level -- 1 -- 2 2   Minutes -- 15 -- 15 15   METs -- 2.1 -- 2.3 2.2     REL-XR   Level -- 1 -- 1 2   Minutes -- 15 -- 15 15   METs -- 3.3 -- 2.5 --     Track   Laps -- 27 -- 30 40   Minutes -- 15 -- 15 15   METs -- 2.47 -- 2.63 3.18     Home Exercise Plan   Plans to continue exercise at -- -- Home (comment)  walking Home (comment)  walking Home (comment)  walking   Frequency -- -- Add 2 additional days to program exercise sessions. Add 2 additional days to program exercise  sessions. Add 2 additional days to program exercise sessions.   Initial Home Exercises Provided -- -- 07/04/22 07/04/22 07/04/22     Oxygen   Maintain Oxygen Saturation -- 88% or higher 88% or higher 88% or higher 88% or higher    Row Name 08/14/22 1400 09/01/22 0700 09/14/22 0700 09/28/22 1100       Response to Exercise   Blood Pressure (Admit) 118/62 126/52 140/52 140/52    Blood Pressure (Exit) 100/56 134/72 126/40 128/50    Heart Rate (Admit) 65 bpm 62 bpm 63 bpm 67 bpm    Heart Rate (Exercise) 75 bpm 89 bpm 84 bpm 88 bpm    Heart Rate (Exit) 63 bpm 64 bpm 62 bpm 65 bpm    Oxygen Saturation (Admit) 94 % 95 % 96 % 95 %    Oxygen Saturation (Exercise) 90 % 90 % 90 % 90 %    Oxygen Saturation (Exit) 96 % 94 % 94 % 96 %    Rating of Perceived Exertion (Exercise) 13 14 13 15     Perceived Dyspnea (Exercise) -- -- 0 0    Symptoms none none none none    Duration Continue with 30 min of aerobic exercise without signs/symptoms of physical distress. Continue with 30 min of aerobic exercise without signs/symptoms of physical distress. Continue with 30 min of aerobic exercise without signs/symptoms of physical distress. Continue with 30 min of aerobic exercise without signs/symptoms of physical distress.    Intensity THRR unchanged THRR unchanged THRR unchanged THRR unchanged      Progression   Progression Continue to progress workloads to maintain intensity without signs/symptoms of physical distress. Continue to progress workloads to maintain intensity without signs/symptoms of physical distress. Continue to progress workloads to maintain intensity without signs/symptoms of physical distress. Continue to progress workloads to maintain intensity without signs/symptoms of physical distress.    Average METs 3.16 3.18 3.25 3.45      Resistance Training   Training Prescription Yes Yes Yes Yes    Weight 3 lb 3 lb 3 lb 2 lb    Reps 10-15 10-15 10-15 10-15      Interval Training   Interval  Training No No No No      Recumbant Bike   Level -- -- -- 2    Watts -- -- -- 19    Minutes -- -- --  15      NuStep   Level 5 5 -- --    Minutes 15 15 -- --    METs 2.6 -- -- --      REL-XR   Level 3 1 2 1     Minutes 15 15 15 15     METs 4.5 4 4.1 4.5      Track   Laps 42 45 43 47    Minutes 15 15 15 15     METs 3.28 3.45 3.34 3.56      Home Exercise Plan   Plans to continue exercise at Home (comment)  walking Home (comment)  walking Home (comment)  walking Home (comment)  walking    Frequency Add 2 additional days to program exercise sessions. Add 2 additional days to program exercise sessions. Add 2 additional days to program exercise sessions. Add 2 additional days to program exercise sessions.    Initial Home Exercises Provided 07/04/22 07/04/22 07/04/22 07/04/22      Oxygen   Maintain Oxygen Saturation 88% or higher 88% or higher 88% or higher 88% or higher             Exercise Comments:   Exercise Comments     Row Name 06/20/22 1002           Exercise Comments First full day of exercise!  Patient was oriented to gym and equipment including functions, settings, policies, and procedures.  Patient's individual exercise prescription and treatment plan were reviewed.  All starting workloads were established based on the results of the 6 minute walk test done at initial orientation visit.  The plan for exercise progression was also introduced and progression will be customized based on patient's performance and goals.                Exercise Goals and Review:   Exercise Goals     Row Name 06/14/22 1346             Exercise Goals   Increase Physical Activity Yes       Intervention Provide advice, education, support and counseling about physical activity/exercise needs.;Develop an individualized exercise prescription for aerobic and resistive training based on initial evaluation findings, risk stratification, comorbidities and participant's personal goals.        Expected Outcomes Short Term: Attend rehab on a regular basis to increase amount of physical activity.;Long Term: Add in home exercise to make exercise part of routine and to increase amount of physical activity.;Long Term: Exercising regularly at least 3-5 days a week.       Increase Strength and Stamina Yes       Intervention Provide advice, education, support and counseling about physical activity/exercise needs.;Develop an individualized exercise prescription for aerobic and resistive training based on initial evaluation findings, risk stratification, comorbidities and participant's personal goals.       Expected Outcomes Short Term: Increase workloads from initial exercise prescription for resistance, speed, and METs.;Short Term: Perform resistance training exercises routinely during rehab and add in resistance training at home;Long Term: Improve cardiorespiratory fitness, muscular endurance and strength as measured by increased METs and functional capacity ( )       Able to understand and use rate of perceived exertion (RPE) scale Yes       Intervention Provide education and explanation on how to use RPE scale       Expected Outcomes Short Term: Able to use RPE daily in rehab to express subjective intensity level;Long Term:  Able to use  RPE to guide intensity level when exercising independently       Able to understand and use Dyspnea scale Yes       Intervention Provide education and explanation on how to use Dyspnea scale       Expected Outcomes Short Term: Able to use Dyspnea scale daily in rehab to express subjective sense of shortness of breath during exertion;Long Term: Able to use Dyspnea scale to guide intensity level when exercising independently       Knowledge and understanding of Target Heart Rate Range (THRR) Yes       Intervention Provide education and explanation of THRR including how the numbers were predicted and where they are located for reference       Expected  Outcomes Short Term: Able to state/look up THRR;Long Term: Able to use THRR to govern intensity when exercising independently;Short Term: Able to use daily as guideline for intensity in rehab       Able to check pulse independently Yes       Intervention Provide education and demonstration on how to check pulse in carotid and radial arteries.;Review the importance of being able to check your own pulse for safety during independent exercise       Expected Outcomes Short Term: Able to explain why pulse checking is important during independent exercise;Long Term: Able to check pulse independently and accurately       Understanding of Exercise Prescription Yes       Intervention Provide education, explanation, and written materials on patient's individual exercise prescription       Expected Outcomes Short Term: Able to explain program exercise prescription;Long Term: Able to explain home exercise prescription to exercise independently                Exercise Goals Re-Evaluation :  Exercise Goals Re-Evaluation     Row Name 06/20/22 1002 07/03/22 1236 07/04/22 1147 07/17/22 1435 08/01/22 0734     Exercise Goal Re-Evaluation   Exercise Goals Review Able to understand and use rate of perceived exertion (RPE) scale;Able to understand and use Dyspnea scale;Knowledge and understanding of Target Heart Rate Range (THRR);Understanding of Exercise Prescription Increase Physical Activity;Increase Strength and Stamina;Understanding of Exercise Prescription Increase Physical Activity;Increase Strength and Stamina;Understanding of Exercise Prescription Increase Physical Activity;Increase Strength and Stamina;Understanding of Exercise Prescription Increase Physical Activity;Increase Strength and Stamina;Understanding of Exercise Prescription   Comments Reviewed RPE scale, THR and program prescription with pt today.  Pt voiced understanding and was given a copy of goals to take home. Diep has done well for her  first couple of weeks she has been at rehab. She has been able to reach 27 laps on the track- her goal is 30 and we hope to see her reach that! She also has exercised at level 1 on the both the XR and T4 Nustep. Her oxygen saturations are staying above 88% and RPEs are appropriate. Will continue to monitor. Reviewed home exercise with pt today.  Pt plans to walk for exercise at this time.  Myara was part of the [place] back in 2019 and is thinking about re-joining again. Reviewed THR, pulse, RPE, sign and symptoms, pulse oximetery and when to call 911 or MD.  Also discussed weather considerations and indoor options.  Pt voiced understanding. Kajsa is doing well in the program. She recently increased her laps on the track to 30 laps. She also improved to level 2 on the T4 nustep. She has continued to use 3 lb hand weights  for resistance training as well. We will continue to monitor her progress in the program. Jacari continues to do well in rehab.  She is now up to 40 laps aiming to get to a mile!  We will conitnue to monitor her progress.   Expected Outcomes Short: Use RPE daily to regulate intensity. Long: Follow program prescription in THR. Short: Increase laps to 30 Long: Increase overall MET level and stamina Short: monitor HR and O2 closely when exercising Long: Continue to exercise routinely independently at home Short: Continue to push for more laps on the track. Long: Continue to improve strength and stamina. Short: Aim for a mile walking Long: conitnue to improve stamina    Row Name 08/14/22 1454 09/01/22 0737 09/14/22 0722 09/28/22 1152       Exercise Goal Re-Evaluation   Exercise Goals Review Increase Physical Activity;Increase Strength and Stamina;Understanding of Exercise Prescription Increase Physical Activity;Increase Strength and Stamina;Understanding of Exercise Prescription Increase Physical Activity;Increase Strength and Stamina;Understanding of Exercise Prescription Increase  Physical Activity;Increase Strength and Stamina;Understanding of Exercise Prescription    Comments Leyda continues to do well in rehab. She is now walking up to 42 laps on the track. She also improved to level 5 on the T4 nustep and level 3 on the XR. She increased her overall average MET level to 3.16 METs as well. We will conitnue to monitor her progress. Carlita is doing well in rehab. She continues to walk above 40 laps on the track with a max of 45 laps. She also has continued to do well with 3 lb hand weights and may benefit from increasing to 4 lb hand weights. We will continue to monitor her progress in the program. Jalanda continues to do well in rehab. She recently increased her overall average MET level to 3.25 METs. She improved back up to level 2 on the XR and continues to walk more than 40 laps on the track. We will continue to monitor her progress in the program. Edi continues to do well in rehab. She increased her laps on the track to 47 laps. She also has stayed consistent at level 1 on both the XR anf T5 nustep. She is due for her post as well, and will look to improve on it. We will continue to monitor her progress in the program.    Expected Outcomes Short: Continue to push for more laps on the track. Long: Continue to improve strength and stamina. Short: Try 4 lb hand weights for resistance training. Long: Continue to improve strength and stamina. Short: Continue to push for more laps on track. Long: Continue exercise to improve strength and stamina. Short: Improve on post . Long: Continue exercise to improve strength and stamina.             Discharge Exercise Prescription (Final Exercise Prescription Changes):  Exercise Prescription Changes - 09/28/22 1100       Response to Exercise   Blood Pressure (Admit) 140/52    Blood Pressure (Exit) 128/50    Heart Rate (Admit) 67 bpm    Heart Rate (Exercise) 88 bpm    Heart Rate (Exit) 65 bpm    Oxygen Saturation  (Admit) 95 %    Oxygen Saturation (Exercise) 90 %    Oxygen Saturation (Exit) 96 %    Rating of Perceived Exertion (Exercise) 15    Perceived Dyspnea (Exercise) 0    Symptoms none    Duration Continue with 30 min of aerobic exercise without signs/symptoms of physical  distress.    Intensity THRR unchanged      Progression   Progression Continue to progress workloads to maintain intensity without signs/symptoms of physical distress.    Average METs 3.45      Resistance Training   Training Prescription Yes    Weight 2 lb    Reps 10-15      Interval Training   Interval Training No      Recumbant Bike   Level 2    Watts 19    Minutes 15      REL-XR   Level 1    Minutes 15    METs 4.5      Track   Laps 47    Minutes 15    METs 3.56      Home Exercise Plan   Plans to continue exercise at Home (comment)   walking   Frequency Add 2 additional days to program exercise sessions.    Initial Home Exercises Provided 07/04/22      Oxygen   Maintain Oxygen Saturation 88% or higher             Nutrition:  Target Goals: Understanding of nutrition guidelines, daily intake of sodium 1500mg , cholesterol 200mg , calories 30% from fat and 7% or less from saturated fats, daily to have 5 or more servings of fruits and vegetables.  Education: All About Nutrition: -Group instruction provided by verbal, written material, interactive activities, discussions, models, and posters to present general guidelines for heart healthy nutrition including fat, fiber, MyPlate, the role of sodium in heart healthy nutrition, utilization of the nutrition label, and utilization of this knowledge for meal planning. Follow up email sent as well. Written material given at graduation. Flowsheet Row Pulmonary Rehab from 08/10/2022 in [facility] Cardiac and Pulmonary Rehab  Education need identified 06/14/22       Biometrics:  Pre Biometrics - 06/14/22 1346       Pre Biometrics   Height 5\' 3"  (1.6 m)     Weight 126 lb 11.2 oz (57.5 kg)    Waist Circumference 32.5 inches    Hip Circumference 37 inches    Waist to Hip Ratio 0.88 %    BMI (Calculated) 22.45    Single Leg Stand 6.81 seconds             Post Biometrics - 10/03/22 1018        Post  Biometrics   Height 5\' 3"  (1.6 m)    Weight 125 lb 14.4 oz (57.1 kg)    Waist Circumference 31 inches    Hip Circumference 35.5 inches    Waist to Hip Ratio 0.87 %    BMI (Calculated) 22.31    Single Leg Stand 8.88 seconds             Nutrition Therapy Plan and Nutrition Goals:  Nutrition Therapy & Goals - 06/14/22 1442       Intervention Plan   Intervention Prescribe, educate and counsel regarding individualized specific dietary modifications aiming towards targeted core components such as weight, hypertension, lipid management, diabetes, heart failure and other comorbidities.    Expected Outcomes Short Term Goal: Understand basic principles of dietary content, such as calories, fat, sodium, cholesterol and nutrients.;Short Term Goal: A plan has been developed with personal nutrition goals set during dietitian appointment.;Long Term Goal: Adherence to prescribed nutrition plan.             Nutrition Assessments:  MEDIFICTS Score Key: ?70 Need to make dietary changes  40-70 Heart Healthy Diet ? 40 Therapeutic Level Cholesterol Diet  Flowsheet Row Pulmonary Rehab from 06/14/2022 in [facility] Cardiac and Pulmonary Rehab  Picture Your Plate Total Score on Admission 68      Picture Your Plate Scores: <16 Unhealthy dietary pattern with much room for improvement. 41-50 Dietary pattern unlikely to meet recommendations for good health and room for improvement. 51-60 More healthful dietary pattern, with some room for improvement.  >60 Healthy dietary pattern, although there may be some specific behaviors that could be improved.   Nutrition Goals Re-Evaluation:  Nutrition Goals Re-Evaluation     Row Name 07/04/22 1031 07/27/22  1022 09/14/22 1019         Goals   Current Weight -- 126 lb (57.2 kg) --     Nutrition Goal Patient is interested in meeting with the RD, we are currently in transition in hiring a new dietician and once they are on board, will meet with the patient. Patient would like to focus more on cooking at home and specifically for herself only. Eat smaller more frequent portion sizes. --     Comment Azriella states she generally follows a "diabetic friendly diet" because her and her husband managed it together for him until he passed. She already focuses on smaller frequent meals. She admits she does go out and get food a lot which could consist of salads with chicken, bean salads. We talked about being mindful about sodium as restaurant food can have a higher sodium content. Now that she is living by herself, she struggles with cooking for herself only. She does freeze her leftovers to help not waste food. I did provide her a couple handouts on healthy budget eating and tips to review. She is interested in meeting with the RD, in which we are in transition with at this time. Patient was informed on why it is important to maintain a balanced diet when dealing with Respiratory issues. Explained that it takes a lot of energy to breath and when they are short of breath often they will need to have a good diet to help keep up with the calories they are expending for breathing. Patient defers RD appointment     Expected Outcome Short: Review paperwork Long: Continue to eat healthy pulmonary based diet Short: Choose and plan snacks accordingly to patients caloric intake to improve breathing. Long: Maintain a diet independently that meets their caloric intake to aid in daily shortness of breath. --              Nutrition Goals Discharge (Final Nutrition Goals Re-Evaluation):  Nutrition Goals Re-Evaluation - 09/14/22 1019       Goals   Comment Patient defers RD appointment              Psychosocial: Target Goals: Acknowledge presence or absence of significant depression and/or stress, maximize coping skills, provide positive support system. Participant is able to verbalize types and ability to use techniques and skills needed for reducing stress and depression.   Education: Stress, Anxiety, and Depression - Group verbal and visual presentation to define topics covered.  Reviews how body is impacted by stress, anxiety, and depression.  Also discusses healthy ways to reduce stress and to treat/manage anxiety and depression.  Written material given at graduation.   Education: Sleep Hygiene -Provides group verbal and written instruction about how sleep can affect your health.  Define sleep hygiene, discuss sleep cycles and impact of sleep habits. Review good sleep hygiene tips.  Initial Review & Psychosocial Screening:  Initial Psych Review & Screening - 06/07/22 1009       Initial Review   Current issues with Current Stress Concerns      Family Dynamics   Good Support System? Yes   2 daughters, neighbor     Barriers   Psychosocial barriers to participate in program There are no identifiable barriers or psychosocial needs.      Screening Interventions   Interventions Encouraged to exercise;To provide support and resources with identified psychosocial needs;Provide feedback about the scores to participant    Expected Outcomes Short Term goal: Utilizing psychosocial counselor, staff and physician to assist with identification of specific Stressors or current issues interfering with healing process. Setting desired goal for each stressor or current issue identified.;Long Term Goal: Stressors or current issues are controlled or eliminated.;Short Term goal: Identification and review with participant of any Quality of Life or Depression concerns found by scoring the questionnaire.;Long Term goal: The participant improves quality of Life and PHQ9 Scores as seen by post  scores and/or verbalization of changes             Quality of Life Scores:  Scores of 19 and below usually indicate a poorer quality of life in these areas.  A difference of  2-3 points is a clinically meaningful difference.  A difference of 2-3 points in the total score of the Quality of Life Index has been associated with significant improvement in overall quality of life, self-image, physical symptoms, and general health in studies assessing change in quality of life.  PHQ-9: Review Flowsheet  More data exists      06/14/2022 12/19/2021 05/31/2016 05/28/2015 05/20/2014  Depression screen PHQ 2/9  Decreased Interest 0 0 0 0 0 0 0  Down, Depressed, Hopeless 0 0 0 0 0 0 0  PHQ - 2 Score 0 0 0 0 0 0 0  Altered sleeping 0 - - - -  Tired, decreased energy 1 - - - -  Change in appetite 1 - - - -  Feeling bad or failure about yourself  0 - - - -  Trouble concentrating 0 - - - -  Moving slowly or fidgety/restless 1 - - - -  Suicidal thoughts 0 - - - -  PHQ-9 Score 3 - - - -  Difficult doing work/chores Not difficult at all - - - -    Details       Multiple values from one day are sorted in reverse-chronological order        Interpretation of Total Score  Total Score Depression Severity:  1-4 = Minimal depression, 5-9 = Mild depression, 10-14 = Moderate depression, 15-19 = Moderately severe depression, 20-27 = Severe depression   Psychosocial Evaluation and Intervention:  Psychosocial Evaluation - 06/07/22 1036       Psychosocial Evaluation & Interventions   Interventions Encouraged to exercise with the program and follow exercise prescription    Comments Ambreen is coming to pulmonary rehab with the diagnosis of pulmonary HTN. She has been managing her heart failure symptoms well. She has noticed her breathing has gotten better with fluid managment. She does report her biggest stressor coming to the program is "white coat syndrome," staff reassured her that we will take it day  by day and keep that in mind with her blood pressure readings. She has a very supportive family and neighbor who are there if she needs anything. She lives alone and does most of her own  care and housekeeping.    Expected Outcomes Short: attend pulmonary rehab for education and exercise. Long: develop and maintain positive self care habits.    Continue Psychosocial Services  Follow up required by staff             Psychosocial Re-Evaluation:  Psychosocial Re-Evaluation     Row Name 07/04/22 1052 07/27/22 1020 09/14/22 1018         Psychosocial Re-Evaluation   Current issues with Current Sleep Concerns None Identified;Current Psychotropic Meds None Identified;Current Psychotropic Meds     Comments Kenika is doing well mentally. She does have issues with sleep as she experiences bladder issues that causes her to get up multiple times during the night to use the bathroom. Her and her doctor talked about it and they are not pursing anything at this time. She knows to speak up if it becomes a bigger issue. She stays compliant using her oxygen at night. Her doctor encouraged her to take naps during the day if that helps. Patient takes zoloft which helps. She has good support from her 2 of her daughters. She has been enjoying the program thus far and denies other concerns at this time. Patient reports no issues with their current mental states, sleep, stress, depression or anxiety. Will follow up with patient in a few weeks for any changes. Patient reports no issues with their current mental states, sleep, stress, depression or anxiety. Will follow up with patient in a few weeks for any changes.     Expected Outcomes Short: Continue rehab attendance for mood boost Long: Continue to maintain a positive attitude Short: Continue to exercise regularly to support mental health and notify staff of any changes. Long: maintain mental health and well being through teaching of rehab or prescribed medications  independently. Short: Continue to exercise regularly to support mental health and notify staff of any changes. Long: maintain mental health and well being through teaching of rehab or prescribed medications independently.     Interventions Encouraged to attend Pulmonary Rehabilitation for the exercise Encouraged to attend Pulmonary Rehabilitation for the exercise Encouraged to attend Pulmonary Rehabilitation for the exercise     Continue Psychosocial Services  Follow up required by staff Follow up required by staff Follow up required by staff              Psychosocial Discharge (Final Psychosocial Re-Evaluation):  Psychosocial Re-Evaluation - 09/14/22 1018       Psychosocial Re-Evaluation   Current issues with None Identified;Current Psychotropic Meds    Comments Patient reports no issues with their current mental states, sleep, stress, depression or anxiety. Will follow up with patient in a few weeks for any changes.    Expected Outcomes Short: Continue to exercise regularly to support mental health and notify staff of any changes. Long: maintain mental health and well being through teaching of rehab or prescribed medications independently.    Interventions Encouraged to attend Pulmonary Rehabilitation for the exercise    Continue Psychosocial Services  Follow up required by staff             Education: Education Goals: Education classes will be provided on a weekly basis, covering required topics. Participant will state understanding/return demonstration of topics presented.  Learning Barriers/Preferences:  Learning Barriers/Preferences - 06/07/22 1006       Learning Barriers/Preferences   Learning Barriers Sight    Learning Preferences None             General Pulmonary Education Topics:  Infection Prevention: - Provides verbal and written material to individual with discussion of infection control including proper hand washing and proper equipment cleaning during  exercise session. Flowsheet Row Pulmonary Rehab from 08/10/2022 in [facility] Cardiac and Pulmonary Rehab  Date 06/14/22  Educator [facility]  Instruction Review Code 1- Verbalizes Understanding       Falls Prevention: - Provides verbal and written material to individual with discussion of falls prevention and safety. Flowsheet Row Pulmonary Rehab from 08/10/2022 in [facility] Cardiac and Pulmonary Rehab  Date 06/14/22  Educator [facility]  Instruction Review Code 1- Verbalizes Understanding       Chronic Lung Disease Review: - Group verbal instruction with posters, models, PowerPoint presentations and videos,  to review new updates, new respiratory medications, new advancements in procedures and treatments. Providing information on websites and "800" numbers for continued self-education. Includes information about supplement oxygen, available portable oxygen systems, continuous and intermittent flow rates, oxygen safety, concentrators, and Medicare reimbursement for oxygen. Explanation of Pulmonary Drugs, including class, frequency, complications, importance of spacers, rinsing mouth after steroid MDI's, and proper cleaning methods for nebulizers. Review of basic lung anatomy and physiology related to function, structure, and complications of lung disease. Review of risk factors. Discussion about methods for diagnosing sleep apnea and types of masks and machines for OSA. Includes a review of the use of types of environmental controls: home humidity, furnaces, filters, dust mite/pet prevention, HEPA vacuums. Discussion about weather changes, air quality and the benefits of nasal washing. Instruction on Warning signs, infection symptoms, calling MD promptly, preventive modes, and value of vaccinations. Review of effective airway clearance, coughing and/or vibration techniques. Emphasizing that all should Create an Action Plan. Written material given at graduation. Flowsheet Row Pulmonary Rehab from 08/10/2022 in [facility] Cardiac  and Pulmonary Rehab  Education need identified 06/14/22  Date 07/13/22  Educator [facility]  Instruction Review Code 1- Verbalizes Understanding       AED/CPR: - Group verbal and written instruction with the use of models to demonstrate the basic use of the AED with the basic ABC's of resuscitation.    Anatomy and Cardiac Procedures: - Group verbal and visual presentation and models provide information about basic cardiac anatomy and function. Reviews the testing methods done to diagnose heart disease and the outcomes of the test results. Describes the treatment choices: Medical Management, Angioplasty, or Coronary Bypass Surgery for treating various heart conditions including Myocardial Infarction, Angina, Valve Disease, and Cardiac Arrhythmias.  Written material given at graduation.   Medication Safety: - Group verbal and visual instruction to review commonly prescribed medications for heart and lung disease. Reviews the medication, class of the drug, and side effects. Includes the steps to properly store meds and maintain the prescription regimen.  Written material given at graduation.   Other: -Provides group and verbal instruction on various topics (see comments) Flowsheet Row Pulmonary Rehab from 08/10/2022 in [facility] Cardiac and Pulmonary Rehab  Date 08/10/22  Educator [facility]  Instruction Review Code 1- Verbalizes Understanding       Knowledge Questionnaire Score:  Knowledge Questionnaire Score - 06/14/22 1440       Knowledge Questionnaire Score   Pre Score 8/18              Core Components/Risk Factors/Patient Goals at Admission:  Personal Goals and Risk Factors at Admission - 06/14/22 1442       Core Components/Risk Factors/Patient Goals on Admission    Weight Management Yes    Intervention Weight Management: Develop a combined  nutrition and exercise program designed to reach desired caloric intake, while maintaining appropriate intake of nutrient and fiber,  sodium and fats, and appropriate energy expenditure required for the weight goal.;Weight Management: Provide education and appropriate resources to help participant work on and attain dietary goals.    Admit Weight 126 lb 11.2 oz (57.5 kg)    Goal Weight: Short Term 127 lb (57.6 kg)    Goal Weight: Long Term 127 lb (57.6 kg)    Expected Outcomes Short Term: Continue to assess and modify interventions until short term weight is achieved;Long Term: Adherence to nutrition and physical activity/exercise program aimed toward attainment of established weight goal;Weight Maintenance: Understanding of the daily nutrition guidelines, which includes 25-35% calories from fat, 7% or less cal from saturated fats, less than 200mg  cholesterol, less than 1.5gm of sodium, & 5 or more servings of fruits and vegetables daily;Understanding recommendations for meals to include 15-35% energy as protein, 25-35% energy from fat, 35-60% energy from carbohydrates, less than 200mg  of dietary cholesterol, 20-35 gm of total fiber daily;Understanding of distribution of calorie intake throughout the day with the consumption of 4-5 meals/snacks    Heart Failure Yes    Intervention Provide a combined exercise and nutrition program that is supplemented with education, support and counseling about heart failure. Directed toward relieving symptoms such as shortness of breath, decreased exercise tolerance, and extremity edema.    Expected Outcomes Improve functional capacity of life;Short term: Attendance in program 2-3 days a week with increased exercise capacity. Reported lower sodium intake. Reported increased fruit and vegetable intake. Reports medication compliance.;Short term: Daily weights obtained and reported for increase. Utilizing diuretic protocols set by physician.;Long term: Adoption of self-care skills and reduction of barriers for early signs and symptoms recognition and intervention leading to self-care maintenance.     Hypertension Yes    Intervention Provide education on lifestyle modifcations including regular physical activity/exercise, weight management, moderate sodium restriction and increased consumption of fresh fruit, vegetables, and low fat dairy, alcohol moderation, and smoking cessation.;Monitor prescription use compliance.    Expected Outcomes Short Term: Continued assessment and intervention until BP is < 140/19mm HG in hypertensive participants. < 130/62mm HG in hypertensive participants with diabetes, heart failure or chronic kidney disease.;Long Term: Maintenance of blood pressure at goal levels.    Lipids Yes    Intervention Provide education and support for participant on nutrition & aerobic/resistive exercise along with prescribed medications to achieve LDL 70mg , HDL >40mg .    Expected Outcomes Short Term: Participant states understanding of desired cholesterol values and is compliant with medications prescribed. Participant is following exercise prescription and nutrition guidelines.;Long Term: Cholesterol controlled with medications as prescribed, with individualized exercise RX and with personalized nutrition plan. Value goals: LDL < 70mg , HDL > 40 mg.             Education:Diabetes - Individual verbal and written instruction to review signs/symptoms of diabetes, desired ranges of glucose level fasting, after meals and with exercise. Acknowledge that pre and post exercise glucose checks will be done for 3 sessions at entry of program.   Know Your Numbers and Heart Failure: - Group verbal and visual instruction to discuss disease risk factors for cardiac and pulmonary disease and treatment options.  Reviews associated critical values for Overweight/Obesity, Hypertension, Cholesterol, and Diabetes.  Discusses basics of heart failure: signs/symptoms and treatments.  Introduces Heart Failure Zone chart for action plan for heart failure.  Written material given at graduation.   Core  Components/Risk Factors/Patient  Goals Review:   Goals and Risk Factor Review     Row Name 07/04/22 1030 07/04/22 1031 07/27/22 1021 09/14/22 1020       Core Components/Risk Factors/Patient Goals Review   Personal Goals Review Hypertension;Heart Failure;Weight Management/Obesity -- Improve shortness of breath with ADL's Weight Management/Obesity    Review Ronneisha is checking her BP at home and keeps a log of it at home. She usually runs 120-130s/60-70s. She is stable at rehab as well. She also watches her weight at home and is looking to maintain at this time, she usually ranges between 122-124 lb.  she is aware to be mindful of any sudden weight gain that could be potentially causes from fluid. She denies any heart failure symotoms. She is staying compliant taking all of her medications. -- Spoke to patient about their shortness of breath and what they can do to improve. Patient has been informed of breathing techniques when starting the program. Patient is informed to tell staff if they have had any med changes and that certain meds they are taking or not taking can be causing shortness of breath. Aubyn wants to stay the same weight. Her blood pressure has been doing well and has been checking her blood pressure at home. Amiodarone is her only medications that she is concerned about. Her doctors want her to stay on it. She has reduced it to 100 and is ok with it and knows it helps her. She was concerned about having labs. Infomred her that the Doctors dont always do labs after med changes.    Expected Outcomes Short: Continue to watch weight, look for any abnormal changes Long: Continue to manage lifestyle risk factors -- Short: Attend [facility] regularly to improve shortness of breath with ADL's. Long: maintain independence with ADL's Short: take antirhythmic medications. Long: maintain medications independently.             Core Components/Risk Factors/Patient Goals at Discharge (Final  Review):   Goals and Risk Factor Review - 09/14/22 1020       Core Components/Risk Factors/Patient Goals Review   Personal Goals Review Weight Management/Obesity    Review Ineze wants to stay the same weight. Her blood pressure has been doing well and has been checking her blood pressure at home. Amiodarone is her only medications that she is concerned about. Her doctors want her to stay on it. She has reduced it to 100 and is ok with it and knows it helps her. She was concerned about having labs. Infomred her that the Doctors dont always do labs after med changes.    Expected Outcomes Short: take antirhythmic medications. Long: maintain medications independently.             ITP Comments:  ITP Comments     Row Name 06/07/22 1048 06/14/22 1321 06/20/22 1001 07/12/22 0942 08/09/22 1134   ITP Comments Initial phone call completed. Diagnosis can be found in [facility] 4/3. EP Orientation scheduled for Wednesday 4/17 at 10:30. Completed and gym orientation. Initial ITP created and sent for review to Dr. Jinny Sanders, Medical Director. First full day of exercise!  Patient was oriented to gym and equipment including functions, settings, policies, and procedures.  Patient's individual exercise prescription and treatment plan were reviewed.  All starting workloads were established based on the results of the 6 minute walk test done at initial orientation visit.  The plan for exercise progression was also introduced and progression will be customized based on patient's performance and goals. 30  Day review completed. Medical Director ITP review done, changes made as directed, and signed approval by Medical Director.    new to program 30 Day review completed. Medical Director ITP review done, changes made as directed, and signed approval by Medical Director.    Row Name 09/05/22 1508 10/04/22 1208         ITP Comments 30 Day review completed. Medical Director ITP review done, changes made as  directed, and signed approval by Medical Director. 30 Day review completed. Medical Director ITP review done, changes made as directed, and signed approval by Medical Director.               Comments:

## 2022-10-10 ENCOUNTER — Encounter: Payer: Medicare Other | Admitting: *Deleted

## 2022-10-10 DIAGNOSIS — I272 Pulmonary hypertension, unspecified: Secondary | ICD-10-CM | POA: Diagnosis not present

## 2022-10-10 NOTE — Progress Notes (Signed)
Daily Session Note  Patient Details  Name: [name] MRN: [id number] Date of Birth: 08-19-1940 Referring Provider:   Flowsheet Row Pulmonary Rehab from 06/14/2022 in [facility] Cardiac and Pulmonary Rehab  Referring Provider Bensimhon       Encounter Date: 10/10/2022  Check In:  Session Check In - 10/10/22 1020       Check-In   Supervising physician immediately available to respond to emergencies See telemetry face sheet for immediately available ER MD    Location [facility]-Cardiac & Pulmonary Rehab    Staff Present Cora Collum, RN, BSN, CCRP;Meredith Jewel Baize, RN BSN;Other   Maxon Conetta BS, Exercise Physiologist   Virtual Visit No    Medication changes reported     No    Fall or balance concerns reported    No    Warm-up and Cool-down Performed on first and last piece of equipment    Resistance Training Performed Yes    VAD Patient? No    PAD/SET Patient? No      Pain Assessment   Currently in Pain? No/denies                Social History   Tobacco Use  Smoking Status Never   Passive exposure: Past  Smokeless Tobacco Never    Goals Met:  Proper associated with RPD/PD & O2 Sat Independence with exercise equipment Exercise tolerated well No report of concerns or symptoms today  Goals Unmet:  Not Applicable  Comments: Pt able to follow exercise prescription today without complaint.  Will continue to monitor for progression.    Dr. Bethann Punches is Medical Director for [facility] Cardiac Rehabilitation.  Dr. Vida Rigger is Medical Director for [facility] Pulmonary Rehabilitation.

## 2022-10-12 ENCOUNTER — Encounter: Payer: Medicare Other | Admitting: *Deleted

## 2022-10-12 DIAGNOSIS — I272 Pulmonary hypertension, unspecified: Secondary | ICD-10-CM | POA: Diagnosis not present

## 2022-10-12 NOTE — Progress Notes (Signed)
Daily Session Note  Patient Details  Name: [name] MRN: [id number] Date of Birth: 1941/01/08 Referring Provider:   Flowsheet Row Pulmonary Rehab from 06/14/2022 in [facility] Cardiac and Pulmonary Rehab  Referring Provider Bensimhon       Encounter Date: 10/12/2022  Check In:  Session Check In - 10/12/22 0935       Check-In   Supervising physician immediately available to respond to emergencies See telemetry face sheet for immediately available ER MD    Location [facility]-Cardiac & Pulmonary Rehab    Staff Present Cora Collum, RN, BSN, CCRP;Maxon Conetta BS, , Exercise Physiologist; Katrinka Blazing, RN, ADN    Virtual Visit No    Medication changes reported     No    Fall or balance concerns reported    No    Warm-up and Cool-down Performed on first and last piece of equipment    Resistance Training Performed Yes    VAD Patient? No    PAD/SET Patient? No      Pain Assessment   Currently in Pain? No/denies                Social History   Tobacco Use  Smoking Status Never   Passive exposure: Past  Smokeless Tobacco Never    Goals Met:  Independence with exercise equipment Exercise tolerated well No report of concerns or symptoms today Strength training completed today  Goals Unmet:  Not Applicable  Comments: Pt able to follow exercise prescription today without complaint.  Will continue to monitor for progression.    Dr. Bethann Punches is Medical Director for [facility] Cardiac Rehabilitation.  Dr. Vida Rigger is Medical Director for [facility] Pulmonary Rehabilitation.

## 2022-10-13 ENCOUNTER — Other Ambulatory Visit: Payer: Self-pay | Admitting: Internal Medicine

## 2022-10-15 ENCOUNTER — Other Ambulatory Visit: Payer: Self-pay | Admitting: Internal Medicine

## 2022-10-15 NOTE — Progress Notes (Unsigned)
Cardiology Office Note  Date:  10/16/2022   ID:  [name], DOB [birth year], MRN [id number]  PCP:  Karie Schwalbe, MD   Chief Complaint  Patient presents with   Follow-up    6 mo f/u. Follow up echo, sleepy study. Meds reviewed. Pt feels well today.     HPI:  [name] is a 82 year old woman with past medical history of Persistent atrial fibrillation Pulmonary hypertension, nonsmoker, wears oxygen at night Followed by pulmonary Shortness of breath Moderate mitral valve regurgitation, severely dilated left atrium PAD with mild bilateral carotid disease Pulmonary pressures on echo initially estimated in the 60s now running low 30s on repeat echo May 2024 Who presents for follow-up of her persistent atrial fibrillation, pulmonary hypertension  Last seen in clinic January 2024 Since then has been seen several times in the CHF clinic and by one of our providers last in June 2024  Echo 12/23  EF 60-65% LA moderately dilated. Moderate MR. G3 DD. RV normal. RVSP 63mm HG   On nighttime oxygen for hypoxia on exertion documented on walk test December 2023 down to 80s, and overnight oximetry down to 75%  hi-res CT at last visit (2/24)  Hi res CT 3/24: No ILD. Air trapping c/w small airway disease. + lung nodules. + CAC   Echo 5/24 EF 55-60% G2DD RV normal RVSP 44 Personally reviewed Sleep study 5/24 AHI 3.7  Did not tolerate [place], able to tolerate [place] Doing pulmonary rehab would like to continue  Sleep study 5/24 AHI 3.7   Home BP: 122-130s systolic on home check in the mornings Blood pressure elevated today 180 systolic over 60 even on my recheck at end of visit  Reports he is taking Lasix 20 daily Feels her breathing is doing great, denies leg swelling, no PND orthopnea Walks the dogs,  [place]  Other past medical history reviewed Echo December 2023  1. Left ventricular ejection fraction, by estimation, is 60 to 65%. The  left  ventricle has normal function. The left ventricle has no regional  wall motion abnormalities. Left ventricular diastolic parameters are  consistent with Grade II diastolic  dysfunction (pseudonormalization).   2. Right ventricular systolic function is normal. The right ventricular  size is normal. There is severely elevated pulmonary artery systolic  pressure. The estimated right ventricular systolic pressure is 62.8 mmHg.   3. Left atrial size was moderately dilated.   seen by [facility] cardiology January 16, 2022 was continued on amiodarone 200 twice daily for her atrial fibrillation, On Eliquis She decided against cardioversion at the time Converted to normal sinus rhythm on her own on evaluation February 06, 2022  Prior imaging studies reviewed Echocardiogram 2020/stress echo showing severe MR Echocardiogram 2021 with mild to moderate MR  Echocardiogram October 2022 NORMAL LEFT VENTRICULAR SYSTOLIC FUNCTION   WITH MILD LVH  NORMAL RIGHT VENTRICULAR SYSTOLIC FUNCTION  TRIVIAL STENOSIS NOTED (See above)  MODERATE to SEVERE MR, TR  AVA(VTI)= 1.34cm^2  SCLEROTIC AoV  MILD AR, PR  EF 55%  SEVERE PULMONARY HTN    PMH:   has a past medical history of Adenomatous colon polyp (2005), Allergy, Aortic insufficiency, Hyperlipidemia, Hypertension, Murmur, Squamous cell carcinoma of skin (08/05/2020), and Uterine cancer (HCC).  PSH:    Past Surgical History:  Procedure Laterality Date   BLADDER SUSPENSION     CARPAL TUNNEL RELEASE Left    COLONOSCOPY  multiple   GLAUCOMA SURGERY     "for narrow lines" (?  narrow angle?)   INTRAOCULAR LENS INSERTION Bilateral 10/22/2014; 11/26/2014   TOTAL ABDOMINAL HYSTERECTOMY W/ BILATERAL SALPINGOOPHORECTOMY     abnormal PAP    Current Outpatient Medications  Medication Sig Dispense Refill   amiodarone (PACERONE) 200 MG tablet Take 0.5 tablets (100 mg total) by mouth daily. 45 tablet 1   amLODipine (NORVASC) 5 MG tablet TAKE 1 TABLET (5 MG  TOTAL) BY MOUTH DAILY. 90 tablet 0   apixaban (ELIQUIS) 2.5 MG TABS tablet Take 1 tablet (2.5 mg total) by mouth 2 (two) times daily. 60 tablet 3   EPINEPHrine 0.3 mg/0.3 mL IJ SOAJ injection Inject 0.3 mg into the muscle as needed for anaphylaxis.     famotidine (PEPCID) 20 MG tablet Take 20 mg by mouth daily.     FARXIGA 10 MG TABS tablet TAKE 1 TABLET BY MOUTH DAILY BEFORE BREAKFAST. 30 tablet 3   fexofenadine (ALLEGRA) 180 MG tablet Take 180 mg by mouth daily.     fluticasone furoate-vilanterol (BREO ELLIPTA) 100-25 MCG/ACT AEPB Inhale 1 puff into the lungs daily. (Patient taking differently: Inhale 1 puff into the lungs as needed.) 30 each 5   furosemide (LASIX) 20 MG tablet Take 1 tablet (20 mg total) by mouth daily. 180 tablet 2   glucosamine-chondroitin 500-400 MG tablet Take 1 tablet by mouth 2 (two) times daily. 1 pm     Krill Oil 500 MG CAPS Take by mouth.     mometasone (NASONEX) 50 MCG/ACT nasal spray Place 1 spray into the nose 2 (two) times daily. 17 g 2   Multiple Vitamin (MULTIVITAMIN) capsule Take 1 capsule by mouth daily.     OXYGEN Inhale into the lungs. 1 liter at bedtime     oxymetazoline (AFRIN) 0.05 % nasal spray Place 1 spray into both nostrils 2 (two) times daily.     potassium chloride SA (KLOR-CON M20) 20 MEQ tablet Take 1 tablet (20 mEq total) by mouth daily. 90 tablet 3   pravastatin (PRAVACHOL) 40 MG tablet TAKE 1 TABLET BY MOUTH EVERYDAY AT BEDTIME (Patient taking differently: Once a day with Lasix) 90 tablet 3   prednisoLONE acetate (PRED FORTE) 1 % ophthalmic suspension Apply to eye.     predniSONE (DELTASONE) 20 MG tablet Take 1 tablet (20 mg total) by mouth daily with breakfast. 5 tablet 0   sertraline (ZOLOFT) 25 MG tablet TAKE 1 TABLET BY MOUTH EVERY DAY 90 tablet 3   sodium chloride (MURO 128) 5 % ophthalmic solution PLACE 1 DROP INTO RIGHT EYE 4 TIMES A DAY     valsartan (DIOVAN) 160 MG tablet TAKE 1 TABLET BY MOUTH 2 TIMES DAILY. 180 tablet 0   vitamin  B-12 (CYANOCOBALAMIN) 500 MCG tablet Take 500 mcg by mouth daily.     albuterol (VENTOLIN HFA) 108 (90 Base) MCG/ACT inhaler Inhale 1-2 puffs into the lungs every 6 (six) hours as needed. (Patient not taking: Reported on 10/16/2022) 8 g 2   doxycycline (VIBRA-TABS) 100 MG tablet Take 1 tablet (100 mg total) by mouth 2 (two) times daily. (Patient not taking: Reported on 08/17/2022) 14 tablet 0   No current facility-administered medications for this visit.     Allergies:   Amlodipine besy-benazepril hcl, Amlodipine besy-benazepril hcl, Lotrel [amlodipine besy-benazepril hcl], Molds & smuts, and Wasp venom   Social History:  The patient  reports that she has never smoked. She has been exposed to tobacco smoke. She has never used smokeless tobacco. She reports current alcohol use. She reports that she does  not use drugs.   Family History:   family history includes Cancer in her maternal aunt; Diabetes in her maternal grandmother; Heart attack (age of onset: 82) in her father; Heart failure in her mother; Lung cancer in her father; Stroke (age of onset: 82) in her mother.    Review of Systems: Review of Systems  Constitutional: Negative.   HENT: Negative.    Respiratory: Negative.    Cardiovascular: Negative.   Gastrointestinal: Negative.   Musculoskeletal: Negative.   Neurological: Negative.   Psychiatric/Behavioral: Negative.    All other systems reviewed and are negative.   PHYSICAL EXAM: VS:  BP (!) 180/62 (BP Location: Left Arm, Patient Position: Sitting)   Pulse 61   Ht 5\' 3"  (1.6 m)   Wt 122 lb (55.3 kg)   SpO2 96%   BMI 21.61 kg/m  , BMI Body mass index is 21.61 kg/m. Constitutional:  oriented to person, place, and time. No distress.  HENT:  Head: Grossly normal Eyes:  no discharge. No scleral icterus.  Neck: No JVD, 1-2 + left greater than right carotid bruits  Cardiovascular: Regular rate and rhythm, no murmurs appreciated Pulmonary/Chest: Clear to auscultation  bilaterally, no wheezes or rails Abdominal: Soft.  no distension.  no tenderness.  Musculoskeletal: Normal range of motion Neurological:  normal muscle tone. Coordination normal. No atrophy Skin: Skin warm and dry Psychiatric: normal affect, pleasant   Recent Labs: 12/19/2021: ALT 14; TSH 2.72 01/31/2022: Hemoglobin 10.7; Platelets 283 04/18/2022: B Natriuretic Peptide 213.3; BUN 20; Creatinine, Ser 0.73; Potassium 3.8; Sodium 138    Lipid Panel Lab Results  Component Value Date   CHOL 141 12/19/2021   HDL 50.20 12/19/2021   LDLCALC 77 12/19/2021   TRIG 68.0 12/19/2021      Wt Readings from Last 3 Encounters:  10/16/22 122 lb (55.3 kg)  10/03/22 125 lb 14.4 oz (57.1 kg)  09/18/22 127 lb (57.6 kg)       ASSESSMENT AND PLAN:  Problem List Items Addressed This Visit       Cardiology Problems   Severe pulmonary hypertension (HCC)   Essential hypertension   Carotid artery disease (HCC)   HYPERLIPIDEMIA     Other   SOB (shortness of breath)   Other Visit Diagnoses     PAF (paroxysmal atrial fibrillation) (HCC)    -  Primary   Chronic diastolic heart failure (HCC)       Pulmonary hypertension, unspecified (HCC)       Mitral valve insufficiency, unspecified etiology       Abnormal PFT          Persistent atrial fibrillation Continues in sinus rhythm Long discussion with her today concerning coming off amiodarone and transitioning to flecainide She prefers no changes at this time given uncertainty with medication change Beta-blocker held for bradycardia  Mitral valve disorder Detailed as severe back in 2020 and 2022 Also with tricuspid valve regurgitation Most recent echo with moderate to severe MR, will continue to closely monitor  Pulmonary hypertension Right heart pressures reduced on recent echo Tolerating Farxiga, Lasix daily, valsartan twice daily maintaining normal sinus rhythm  Essential hypertension Blood pressure markedly elevated on today even  on my manual recheck Numbers that she records at home typically well-controlled with pressure 1 20-1 30 in the mornings Recommend she closely monitor blood pressure in the late afternoon early evening Would take extra amlodipine for systolic pressure over 140   Total encounter time more than 40 minutes  Greater than 50%  was spent in counseling and coordination of care with the patient    Signed, Dossie Arbour, M.D., Ph.D. [facility] [place], [place] [phone]

## 2022-10-16 ENCOUNTER — Ambulatory Visit: Payer: Medicare Other | Attending: Cardiovascular Disease | Admitting: Cardiovascular Disease

## 2022-10-16 ENCOUNTER — Encounter: Payer: Self-pay | Admitting: Cardiovascular Disease

## 2022-10-16 VITALS — BP 180/62 | HR 61 | Ht 63.0 in | Wt 122.0 lb

## 2022-10-16 DIAGNOSIS — I10 Essential (primary) hypertension: Secondary | ICD-10-CM | POA: Diagnosis not present

## 2022-10-16 DIAGNOSIS — I5032 Chronic diastolic (congestive) heart failure: Secondary | ICD-10-CM

## 2022-10-16 DIAGNOSIS — R0989 Other specified symptoms and signs involving the circulatory and respiratory systems: Secondary | ICD-10-CM

## 2022-10-16 DIAGNOSIS — R942 Abnormal results of pulmonary function studies: Secondary | ICD-10-CM

## 2022-10-16 DIAGNOSIS — I340 Nonrheumatic mitral (valve) insufficiency: Secondary | ICD-10-CM

## 2022-10-16 DIAGNOSIS — I6523 Occlusion and stenosis of bilateral carotid arteries: Secondary | ICD-10-CM

## 2022-10-16 DIAGNOSIS — E782 Mixed hyperlipidemia: Secondary | ICD-10-CM

## 2022-10-16 DIAGNOSIS — I480 Paroxysmal atrial fibrillation: Secondary | ICD-10-CM

## 2022-10-16 DIAGNOSIS — I272 Pulmonary hypertension, unspecified: Secondary | ICD-10-CM | POA: Diagnosis not present

## 2022-10-16 DIAGNOSIS — R0602 Shortness of breath: Secondary | ICD-10-CM

## 2022-10-16 MED ORDER — AMLODIPINE BESYLATE 5 MG PO TABS: 5.0000 mg | ORAL_TABLET | Freq: Every day | ORAL | Status: DC

## 2022-10-16 NOTE — Patient Instructions (Addendum)
Medication Instructions:  Monitor Blood pressure at night IF elevated >140, take extra amlodipine 5 mg  Read about changing to flecainide  If you need a refill on your cardiac medications before your next appointment, please call your pharmacy.   Lab work: No new labs needed  Testing/Procedures: Your physician has requested that you have a carotid duplex. This test is an ultrasound of the carotid arteries in your neck. It looks at blood flow through these arteries that supply the brain with blood.   Allow one hour for this exam.  There are no restrictions or special instructions.  This will take place at [address] [address], [place] [address]    Follow-Up: At [facility], you and your health needs are our priority.  As part of our continuing mission to provide you with exceptional heart care, we have created designated Provider Care Teams.  These Care Teams include your primary Cardiologist (physician) and Advanced Practice Providers (APPs -  Physician Assistants and Nurse Practitioners) who all work together to provide you with the care you need, when you need it.  You will need a follow up appointment in 6 months  Providers on your designated Care Team:   Nicolasa Ducking, NP Eula Listen, PA-C Cadence Fransico Michael, [place]  COVID-19 Vaccine Information can be found at: [email or web address] For questions related to vaccine distribution or appointments, please email [email or web address] or call [phone].

## 2022-10-17 ENCOUNTER — Encounter: Payer: Medicare Other | Admitting: *Deleted

## 2022-10-17 DIAGNOSIS — I272 Pulmonary hypertension, unspecified: Secondary | ICD-10-CM | POA: Diagnosis not present

## 2022-10-17 DIAGNOSIS — J3089 Other allergic rhinitis: Secondary | ICD-10-CM | POA: Diagnosis not present

## 2022-10-17 DIAGNOSIS — J3081 Allergic rhinitis due to animal (cat) (dog) hair and dander: Secondary | ICD-10-CM | POA: Diagnosis not present

## 2022-10-17 DIAGNOSIS — J301 Allergic rhinitis due to pollen: Secondary | ICD-10-CM | POA: Diagnosis not present

## 2022-10-17 NOTE — Progress Notes (Signed)
Daily Session Note  Patient Details  Name: Sujata Tartaglione MRN: [id number] Date of Birth: 07-Mar-1940 Referring Provider:   Flowsheet Row Pulmonary Rehab from 06/14/2022 in [facility] Cardiac and Pulmonary Rehab  Referring Provider Bensimhon       Encounter Date: 10/17/2022  Check In:  Session Check In - 10/17/22 1007       Check-In   Supervising physician immediately available to respond to emergencies See telemetry face sheet for immediately available ER MD    Location [facility]-Cardiac & Pulmonary Rehab    Staff Present Maxon Suzzette Righter, , Exercise Physiologist;Lulla Linville Jewel Baize, RN BSN;Jason Wallace Cullens, RDN, Luiz Iron, BS, Exercise Physiologist    Virtual Visit No    Medication changes reported     No    Fall or balance concerns reported    No    Tobacco Cessation No Change    Warm-up and Cool-down Performed on first and last piece of equipment    Resistance Training Performed Yes    VAD Patient? No    PAD/SET Patient? No      Pain Assessment   Currently in Pain? No/denies                Social History   Tobacco Use  Smoking Status Never   Passive exposure: Past  Smokeless Tobacco Never    Goals Met:  Independence with exercise equipment Exercise tolerated well No report of concerns or symptoms today Strength training completed today  Goals Unmet:  Not Applicable  Comments: Pt able to follow exercise prescription today without complaint.  Will continue to monitor for progression.    Dr. Bethann Punches is Medical Director for [facility] Cardiac Rehabilitation.  Dr. Vida Rigger is Medical Director for [facility] Pulmonary Rehabilitation.

## 2022-10-19 ENCOUNTER — Encounter: Payer: Medicare Other | Admitting: *Deleted

## 2022-10-19 DIAGNOSIS — I272 Pulmonary hypertension, unspecified: Secondary | ICD-10-CM | POA: Diagnosis not present

## 2022-10-19 NOTE — Progress Notes (Signed)
Daily Session Note  Patient Details  Name: Hinata Horace MRN: [id number] Date of Birth: [birth year] Referring Provider:   Flowsheet Row Pulmonary Rehab from 06/14/2022 in [facility] Cardiac and Pulmonary Rehab  Referring Provider Bensimhon       Encounter Date: 10/19/2022  Check In:  Session Check In - 10/19/22 1005       Check-In   Supervising physician immediately available to respond to emergencies See telemetry face sheet for immediately available ER MD    Location [facility]-Cardiac & Pulmonary Rehab    Staff Present Cora Collum, RN, BSN, CCRP;Noah Tickle, BS, Exercise Physiologist;Maxon Conetta BS, , Exercise Physiologist    Virtual Visit No    Medication changes reported     No    Fall or balance concerns reported    No    Warm-up and Cool-down Performed on first and last piece of equipment    Resistance Training Performed Yes    VAD Patient? No    PAD/SET Patient? No      Pain Assessment   Currently in Pain? No/denies                Social History   Tobacco Use  Smoking Status Never   Passive exposure: Past  Smokeless Tobacco Never    Goals Met:  Proper associated with RPD/PD & O2 Sat Independence with exercise equipment Exercise tolerated well No report of concerns or symptoms today  Goals Unmet:  Not Applicable  Comments: Pt able to follow exercise prescription today without complaint.  Will continue to monitor for progression.    Dr. Bethann Punches is Medical Director for [facility] Cardiac Rehabilitation.  Dr. Vida Rigger is Medical Director for [facility] Pulmonary Rehabilitation.

## 2022-10-24 ENCOUNTER — Encounter: Payer: Medicare Other | Admitting: *Deleted

## 2022-10-24 DIAGNOSIS — I272 Pulmonary hypertension, unspecified: Secondary | ICD-10-CM

## 2022-10-24 NOTE — Progress Notes (Signed)
Daily Session Note  Patient Details  Name: [name] MRN: [id number] Date of Birth: [birth year] Referring Provider:   Flowsheet Row Pulmonary Rehab from 06/14/2022 in [facility] Cardiac and Pulmonary Rehab  Referring Provider Bensimhon       Encounter Date: 10/24/2022  Check In:  Session Check In - 10/24/22 1000       Check-In   Supervising physician immediately available to respond to emergencies See telemetry face sheet for immediately available ER MD    Location [facility]-Cardiac & Pulmonary Rehab    Staff Present Cora Collum, RN, BSN, CCRP;Noah Tickle, BS, Exercise Physiologist;Maxon Conetta BS, , Exercise Physiologist    Virtual Visit No    Medication changes reported     No    Fall or balance concerns reported    No    Warm-up and Cool-down Performed on first and last piece of equipment    Resistance Training Performed Yes    VAD Patient? No    PAD/SET Patient? No      Pain Assessment   Currently in Pain? No/denies                Social History   Tobacco Use  Smoking Status Never   Passive exposure: Past  Smokeless Tobacco Never    Goals Met:  Independence with exercise equipment Exercise tolerated well Personal goals reviewed No report of concerns or symptoms today  Goals Unmet:  Not Applicable  Comments:  [name] graduated today from  rehab with 36 sessions completed.  Details of the patient's exercise prescription and what She needs to do in order to continue the prescription and progress were discussed with patient.  Patient was given a copy of prescription and goals.  Patient verbalized understanding. [name] plans to continue to exercise by walking at home.    Dr. Bethann Punches is Medical Director for [facility] Cardiac Rehabilitation.  Dr. Vida Rigger is Medical Director for [facility] Pulmonary Rehabilitation.

## 2022-10-24 NOTE — Progress Notes (Signed)
Discharge Note for  [name]     09-05-1940          Para March graduated today from  rehab with 36 sessions completed.  Details of the patient's exercise prescription and what She needs to do in order to continue the prescription and progress were discussed with patient.  Patient was given a copy of prescription and goals.  Patient verbalized understanding. Mosella plans to continue to exercise by walking at home.    6 Minute Walk     Row Name 06/14/22 1321 10/03/22 1013       6 Minute Walk   Phase Initial Discharge    Distance 1080 feet 1555 feet    Distance % Change -- 44 %    Distance Feet Change -- 475 ft    Walk Time 6 minutes 6 minutes    # of Rest Breaks 0 0    MPH 2.05 2.95    METS 1.97 2.94    RPE 12 13    Perceived Dyspnea  0 0    VO2 Peak 6.88 10.29    Symptoms No No    Resting HR 71 bpm 69 bpm    Resting BP 138/82 138/50    Resting Oxygen Saturation  96 % 95 %    Exercise Oxygen Saturation  during 6 min walk 94 % 90 %    Max Ex. HR 80 bpm 102 bpm    Max Ex. BP 160/62 144/60    2 Minute Post BP 142/72 140/56      Interval HR   1 Minute HR 77 87    2 Minute HR 78 96    3 Minute HR 78 96    4 Minute HR 79 100    5 Minute HR 79 102    6 Minute HR 80 100    2 Minute Post HR 69 75    Interval Heart Rate? Yes Yes      Interval Oxygen   Interval Oxygen? Yes Yes    Baseline Oxygen Saturation % 96 % 95 %    1 Minute Oxygen Saturation % 97 % 95 %    1 Minute Liters of Oxygen 0 L 0 L  RA    2 Minute Oxygen Saturation % 94 % 91 %    2 Minute Liters of Oxygen 0 L 0 L    3 Minute Oxygen Saturation % 94 % 92 %    3 Minute Liters of Oxygen 0 L 0 L    4 Minute Oxygen Saturation % 95 % 91 %    4 Minute Liters of Oxygen 0 L 0 L    5 Minute Oxygen Saturation % 96 % 90 %    5 Minute Liters of Oxygen 0 L 0 L    6 Minute Oxygen Saturation % 97 % 93 %    6 Minute Liters of Oxygen 0 L 0 L    2 Minute Post Oxygen Saturation % 98 % 96 %    2 Minute Post Liters of  Oxygen 0 L 0 L

## 2022-10-24 NOTE — Progress Notes (Signed)
Pulmonary Individual Treatment Plan  Patient Details  Name: Jhenna Enquist MRN: [id number] Date of Birth: [birth year] Referring Provider:   Flowsheet Row Pulmonary Rehab from 06/14/2022 in [facility] Cardiac and Pulmonary Rehab  Referring Provider Bensimhon       Initial Encounter Date:  Flowsheet Row Pulmonary Rehab from 06/14/2022 in [facility] Cardiac and Pulmonary Rehab  Date 06/14/22       Visit Diagnosis: Pulmonary HTN (HCC)  Patient's Home Medications on Admission:  Current Outpatient Medications:    albuterol (VENTOLIN HFA) 108 (90 Base) MCG/ACT inhaler, Inhale 1-2 puffs into the lungs every 6 (six) hours as needed. (Patient not taking: Reported on 10/16/2022), Disp: 8 g, Rfl: 2   amiodarone (PACERONE) 200 MG tablet, Take 0.5 tablets (100 mg total) by mouth daily., Disp: 45 tablet, Rfl: 1   amLODipine (NORVASC) 5 MG tablet, Take 1 tablet (5 mg total) by mouth daily. Take additional tablet for BP greater than 140, Disp: , Rfl:    apixaban (ELIQUIS) 2.5 MG TABS tablet, Take 1 tablet (2.5 mg total) by mouth 2 (two) times daily., Disp: 60 tablet, Rfl: 3   doxycycline (VIBRA-TABS) 100 MG tablet, Take 1 tablet (100 mg total) by mouth 2 (two) times daily. (Patient not taking: Reported on 08/17/2022), Disp: 14 tablet, Rfl: 0   EPINEPHrine 0.3 mg/0.3 mL IJ SOAJ injection, Inject 0.3 mg into the muscle as needed for anaphylaxis., Disp: , Rfl:    famotidine (PEPCID) 20 MG tablet, Take 20 mg by mouth daily., Disp: , Rfl:    FARXIGA 10 MG TABS tablet, TAKE 1 TABLET BY MOUTH DAILY BEFORE BREAKFAST., Disp: 30 tablet, Rfl: 3   fexofenadine (ALLEGRA) 180 MG tablet, Take 180 mg by mouth daily., Disp: , Rfl:    fluticasone furoate-vilanterol (BREO ELLIPTA) 100-25 MCG/ACT AEPB, Inhale 1 puff into the lungs daily. (Patient taking differently: Inhale 1 puff into the lungs as needed.), Disp: 30 each, Rfl: 5   furosemide (LASIX) 20 MG tablet, Take 1 tablet (20 mg total) by mouth daily., Disp: 180 tablet, Rfl: 2    glucosamine-chondroitin 500-400 MG tablet, Take 1 tablet by mouth 2 (two) times daily. 1 pm, Disp: , Rfl:    Krill Oil 500 MG CAPS, Take by mouth., Disp: , Rfl:    mometasone (NASONEX) 50 MCG/ACT nasal spray, Place 1 spray into the nose 2 (two) times daily., Disp: 17 g, Rfl: 2   Multiple Vitamin (MULTIVITAMIN) capsule, Take 1 capsule by mouth daily., Disp: , Rfl:    OXYGEN, Inhale into the lungs. 1 liter at bedtime, Disp: , Rfl:    oxymetazoline (AFRIN) 0.05 % nasal spray, Place 1 spray into both nostrils 2 (two) times daily., Disp: , Rfl:    potassium chloride SA (KLOR-CON M20) 20 MEQ tablet, Take 1 tablet (20 mEq total) by mouth daily., Disp: 90 tablet, Rfl: 3   pravastatin (PRAVACHOL) 40 MG tablet, TAKE 1 TABLET BY MOUTH EVERYDAY AT BEDTIME (Patient taking differently: Once a day with Lasix), Disp: 90 tablet, Rfl: 3   prednisoLONE acetate (PRED FORTE) 1 % ophthalmic suspension, Apply to eye., Disp: , Rfl:    predniSONE (DELTASONE) 20 MG tablet, Take 1 tablet (20 mg total) by mouth daily with breakfast., Disp: 5 tablet, Rfl: 0   sertraline (ZOLOFT) 25 MG tablet, TAKE 1 TABLET BY MOUTH EVERY DAY, Disp: 90 tablet, Rfl: 3   sodium chloride (MURO 128) 5 % ophthalmic solution, PLACE 1 DROP INTO RIGHT EYE 4 TIMES A DAY, Disp: , Rfl:  valsartan (DIOVAN) 160 MG tablet, TAKE 1 TABLET BY MOUTH 2 TIMES DAILY., Disp: 180 tablet, Rfl: 0   vitamin B-12 (CYANOCOBALAMIN) 500 MCG tablet, Take 500 mcg by mouth daily., Disp: , Rfl:   Past Medical History: Past Medical History:  Diagnosis Date   Adenomatous colon polyp 2005   Allergy    perennial ; shots from Dr Corinda Gubler   Aortic insufficiency    Hyperlipidemia    Hypertension    Murmur    a.  04/2011 Echo:  EF 55-60%, mild-mod ai/mr   Squamous cell carcinoma of skin 08/05/2020   left pretibia EDC   Uterine cancer (HCC)     Tobacco Use: Social History   Tobacco Use  Smoking Status Never   Passive exposure: Past  Smokeless Tobacco Never     Labs: Review Flowsheet  More data exists      Latest Ref Rng & Units 05/31/2016 06/21/2017 08/14/2018 12/03/2020 12/19/2021  Labs for ITP Cardiac and Pulmonary Rehab  Cholestrol 0 - 200 mg/dL 710  626  948  546  270   LDL (calc) 0 - 99 mg/dL 91  77  71  60  77   HDL-C >39.00 mg/dL 35.00  93.81  82.99  37.16  50.20   Trlycerides 0.0 - 149.0 mg/dL 96.7  89.3  81.0  17.5  68.0   Hemoglobin A1c 4.6 - 6.5 % 5.8  5.8  5.8  - -    Details             Pulmonary Assessment Scores:  Pulmonary Assessment Scores     Row Name 06/14/22 1444         ADL UCSD   ADL Phase Entry     SOB Score total 7     Rest 0     Walk 0     Stairs 1     Bath 0     Dress 0     Shop 0       CAT Score   CAT Score 5       mMRC Score   mMRC Score 2              UCSD: Self-administered rating of dyspnea associated with activities of daily living (ADLs) 6-point scale (0 = "not at all" to 5 = "maximal or unable to do because of breathlessness")  Scoring Scores range from 0 to 120.  Minimally important difference is 5 units  CAT: CAT can identify the health impairment of COPD patients and is better correlated with disease progression.  CAT has a scoring range of zero to 40. The CAT score is classified into four groups of low (less than 10), medium (10 - 20), high (21-30) and very high (31-40) based on the impact level of disease on health status. A CAT score over 10 suggests significant symptoms.  A worsening CAT score could be explained by an exacerbation, poor medication adherence, poor inhaler technique, or progression of COPD or comorbid conditions.  CAT MCID is 2 points  mMRC: mMRC (Modified Medical Research Council) Dyspnea Scale is used to assess the degree of baseline functional disability in patients of respiratory disease due to dyspnea. No minimal important difference is established. A decrease in score of 1 point or greater is considered a positive change.   Pulmonary Function  Assessment:   Exercise Target Goals: Exercise Program Goal: Individual exercise prescription set using results from initial 6 min walk test and THRR while considering  patient's activity  barriers and safety.   Exercise Prescription Goal: Initial exercise prescription builds to 30-45 minutes a day of aerobic activity, 2-3 days per week.  Home exercise guidelines will be given to patient during program as part of exercise prescription that the participant will acknowledge.  Education: Aerobic Exercise: - Group verbal and visual presentation on the components of exercise prescription. Introduces F.I.T.T principle from ACSM for exercise prescriptions.  Reviews F.I.T.T. principles of aerobic exercise including progression. Written material given at graduation.   Education: Resistance Exercise: - Group verbal and visual presentation on the components of exercise prescription. Introduces F.I.T.T principle from ACSM for exercise prescriptions  Reviews F.I.T.T. principles of resistance exercise including progression. Written material given at graduation.    Education: Exercise & Equipment Safety: - Individual verbal instruction and demonstration of equipment use and safety with use of the equipment. Flowsheet Row Pulmonary Rehab from 08/10/2022 in [facility] Cardiac and Pulmonary Rehab  Date 06/14/22  Educator [facility]  Instruction Review Code 1- Verbalizes Understanding       Education: Exercise Physiology & General Exercise Guidelines: - Group verbal and written instruction with models to review the exercise physiology of the cardiovascular system and associated critical values. Provides general exercise guidelines with specific guidelines to those with heart or lung disease.    Education: Flexibility, Balance, Mind/Body Relaxation: - Group verbal and visual presentation with interactive activity on the components of exercise prescription. Introduces F.I.T.T principle from ACSM for exercise  prescriptions. Reviews F.I.T.T. principles of flexibility and balance exercise training including progression. Also discusses the mind body connection.  Reviews various relaxation techniques to help reduce and manage stress (i.e. Deep breathing, progressive muscle relaxation, and visualization). Balance handout provided to take home. Written material given at graduation.   Activity Barriers & Risk Stratification:  Activity Barriers & Cardiac Risk Stratification - 06/14/22 1342       Activity Barriers & Cardiac Risk Stratification   Activity Barriers Back Problems             6 Minute Walk:  6 Minute Walk     Row Name 06/14/22 1321 10/03/22 1013       6 Minute Walk   Phase Initial Discharge    Distance 1080 feet 1555 feet    Distance % Change -- 44 %    Distance Feet Change -- 475 ft    Walk Time 6 minutes 6 minutes    # of Rest Breaks 0 0    MPH 2.05 2.95    METS 1.97 2.94    RPE 12 13    Perceived Dyspnea  0 0    VO2 Peak 6.88 10.29    Symptoms No No    Resting HR 71 bpm 69 bpm    Resting BP 138/82 138/50    Resting Oxygen Saturation  96 % 95 %    Exercise Oxygen Saturation  during 6 min walk 94 % 90 %    Max Ex. HR 80 bpm 102 bpm    Max Ex. BP 160/62 144/60    2 Minute Post BP 142/72 140/56      Interval HR   1 Minute HR 77 87    2 Minute HR 78 96    3 Minute HR 78 96    4 Minute HR 79 100    5 Minute HR 79 102    6 Minute HR 80 100    2 Minute Post HR 69 75    Interval Heart Rate? Yes Yes  Interval Oxygen   Interval Oxygen? Yes Yes    Baseline Oxygen Saturation % 96 % 95 %    1 Minute Oxygen Saturation % 97 % 95 %    1 Minute Liters of Oxygen 0 L 0 L  RA    2 Minute Oxygen Saturation % 94 % 91 %    2 Minute Liters of Oxygen 0 L 0 L    3 Minute Oxygen Saturation % 94 % 92 %    3 Minute Liters of Oxygen 0 L 0 L    4 Minute Oxygen Saturation % 95 % 91 %    4 Minute Liters of Oxygen 0 L 0 L    5 Minute Oxygen Saturation % 96 % 90 %    5 Minute  Liters of Oxygen 0 L 0 L    6 Minute Oxygen Saturation % 97 % 93 %    6 Minute Liters of Oxygen 0 L 0 L    2 Minute Post Oxygen Saturation % 98 % 96 %    2 Minute Post Liters of Oxygen 0 L 0 L            Oxygen Initial Assessment:  Oxygen Initial Assessment - 06/14/22 1444       Home Oxygen   Home Oxygen Device None    Sleep Oxygen Prescription Continuous    Liters per minute 2    Home Exercise Oxygen Prescription None    Home Resting Oxygen Prescription None      Initial 6 min Walk   Oxygen Used None      Program Oxygen Prescription   Program Oxygen Prescription None      Intervention   Short Term Goals To learn and understand importance of monitoring SPO2 with pulse oximeter and demonstrate accurate use of the pulse oximeter.;To learn and understand importance of maintaining oxygen saturations>88%;To learn and demonstrate proper pursed lip breathing techniques or other breathing techniques. ;To learn and demonstrate proper use of respiratory medications    Long  Term Goals Verbalizes importance of monitoring SPO2 with pulse oximeter and return demonstration;Maintenance of O2 saturations>88%;Exhibits proper breathing techniques, such as pursed lip breathing or other method taught during program session;Compliance with respiratory medication;Demonstrates proper use of MDI's             Oxygen Re-Evaluation:  Oxygen Re-Evaluation     Row Name 06/20/22 1003 07/04/22 1358 07/27/22 1019 09/14/22 1016       Program Oxygen Prescription   Program Oxygen Prescription -- None None None      Home Oxygen   Home Oxygen Device -- None Home Concentrator Home Concentrator    Sleep Oxygen Prescription -- Continuous Continuous Continuous    Liters per minute -- 2 1 1     Home Exercise Oxygen Prescription -- None None None    Home Resting Oxygen Prescription -- None None None    Compliance with Home Oxygen Use -- -- Yes Yes      Goals/Expected Outcomes   Short Term Goals -- To  learn and understand importance of monitoring SPO2 with pulse oximeter and demonstrate accurate use of the pulse oximeter.;To learn and understand importance of maintaining oxygen saturations>88%;To learn and demonstrate proper pursed lip breathing techniques or other breathing techniques. ;To learn and demonstrate proper use of respiratory medications To learn and understand importance of maintaining oxygen saturations>88%;To learn and understand importance of monitoring SPO2 with pulse oximeter and demonstrate accurate use of the pulse oximeter. To learn and  demonstrate proper pursed lip breathing techniques or other breathing techniques.     Long  Term Goals -- Verbalizes importance of monitoring SPO2 with pulse oximeter and return demonstration;Maintenance of O2 saturations>88%;Exhibits proper breathing techniques, such as pursed lip breathing or other method taught during program session;Compliance with respiratory medication;Demonstrates proper use of MDI's Verbalizes importance of monitoring SPO2 with pulse oximeter and return demonstration;Maintenance of O2 saturations>88% Exhibits proper breathing techniques, such as pursed lip breathing or other method taught during program session    Comments Reviewed PLB technique with pt.  Talked about how it works and it's importance in maintaining their exercise saturations. Draya states she is compliant with using her sleep oxygen. She does have a pulse ox to monitor her HR and O2 levels and is aware to make sure her oxygen levels stays above 88%. We talked more about PLB today and she is going to practice it more. She experiences a lot of sinus issues which impacts her SOB at times, but continues to take medications for it to help relieve some symptoms. She has a pulse oximeter to check her oxygen saturation at home. Informed him/her where to get one and explained why it is important to have one. Reviewed that oxygen saturations should be 88 percent and above.  Diaphragmatic and PLB breathing explained and performed with patient. Patient has a better understanding of how to do these exercises to help with breathing performance and relaxation. Patient performed breathing techniques adequately and to practice further at home.    Goals/Expected Outcomes Short: Become more profiecient at using PLB. Long: Become independent at using PLB. Short: Continue to monitor O2 saturations closely, notify of any concerns Long: Continue to become proficient at using PLB and monitoring oxygen Short: monitor oxygen at home with exertion. Long: maintain oxygen saturations above 88 percent independently. Short: practice PLB and diaphragmatic breathing at home. Long: Use PLB and diaphragmatic breathing independently post [employer].             Oxygen Discharge (Final Oxygen Re-Evaluation):  Oxygen Re-Evaluation - 09/14/22 1016       Program Oxygen Prescription   Program Oxygen Prescription None      Home Oxygen   Home Oxygen Device Home Concentrator    Sleep Oxygen Prescription Continuous    Liters per minute 1    Home Exercise Oxygen Prescription None    Home Resting Oxygen Prescription None    Compliance with Home Oxygen Use Yes      Goals/Expected Outcomes   Short Term Goals To learn and demonstrate proper pursed lip breathing techniques or other breathing techniques.     Long  Term Goals Exhibits proper breathing techniques, such as pursed lip breathing or other method taught during program session    Comments Diaphragmatic and PLB breathing explained and performed with patient. Patient has a better understanding of how to do these exercises to help with breathing performance and relaxation. Patient performed breathing techniques adequately and to practice further at home.    Goals/Expected Outcomes Short: practice PLB and diaphragmatic breathing at home. Long: Use PLB and diaphragmatic breathing independently post [employer].             Initial Exercise  Prescription:  Initial Exercise Prescription - 06/14/22 1300       Date of Initial Exercise RX and Referring Provider   Date 06/14/22    Referring Provider Bensimhon      Oxygen   Maintain Oxygen Saturation 88% or higher  Recumbant Bike   Level 2    RPM 50    Watts 15    Minutes 15      NuStep   Level 2    SPM 80    Minutes 15    METs 1.97      REL-XR   Level 2    Speed 50    Minutes 15    METs 1.97      Track   Laps 30    Minutes 15    METs 2.63      Prescription Details   Frequency (times per week) 2    Duration Progress to 30 minutes of continuous aerobic without signs/symptoms of physical distress      Intensity   THRR 40-80% of Max Heartrate 97-124    Ratings of Perceived Exertion 11-13    Perceived Dyspnea 0-4      Progression   Progression Continue to progress workloads to maintain intensity without signs/symptoms of physical distress.      Resistance Training   Training Prescription Yes    Weight 2    Reps 10-15             Perform Capillary Blood Glucose checks as needed.  Exercise Prescription Changes:   Exercise Prescription Changes     Row Name 06/14/22 1300 07/03/22 1200 07/04/22 1100 07/17/22 1400 08/01/22 0700     Response to Exercise   Blood Pressure (Admit) 138/82 114/58 -- 130/62 138/60   Blood Pressure (Exercise) 160/62 126/58 -- 134/62 158/60   Blood Pressure (Exit) 142/72 118/60 -- 120/58 132/58   Heart Rate (Admit) 71 bpm 65 bpm -- 71 bpm 63 bpm   Heart Rate (Exercise) 80 bpm 98 bpm -- 98 bpm 80 bpm   Heart Rate (Exit) 69 bpm 58 bpm -- 72 bpm 65 bpm   Oxygen Saturation (Admit) 96 % 97 % -- 94 % 92 %   Oxygen Saturation (Exercise) 94 % 91 % -- 90 % 97 %   Oxygen Saturation (Exit) 98 % 94 % -- 94 % 97 %   Rating of Perceived Exertion (Exercise) 12 13 -- 13 13   Perceived Dyspnea (Exercise) 0 2 -- 2 --   Symptoms none SOB -- SOB none   Comments 6 MWT results 2nd full week of exercise -- -- --   Duration -- Progress  to 30 minutes of  aerobic without signs/symptoms of physical distress -- Continue with 30 min of aerobic exercise without signs/symptoms of physical distress. Continue with 30 min of aerobic exercise without signs/symptoms of physical distress.   Intensity -- THRR unchanged -- THRR unchanged THRR unchanged     Progression   Progression -- Continue to progress workloads to maintain intensity without signs/symptoms of physical distress. -- Continue to progress workloads to maintain intensity without signs/symptoms of physical distress. Continue to progress workloads to maintain intensity without signs/symptoms of physical distress.   Average METs -- 2.56 -- 2.3 2.69     Resistance Training   Training Prescription -- Yes -- Yes Yes   Weight -- 2 lb -- 2 lb 3 lb   Reps -- 10-15 -- 10-15 10-15     Interval Training   Interval Training -- No -- No No     NuStep   Level -- 1 -- 2 2   Minutes -- 15 -- 15 15   METs -- 2.1 -- 2.3 2.2     REL-XR   Level -- 1 -- 1 2  Minutes -- 15 -- 15 15   METs -- 3.3 -- 2.5 --     Track   Laps -- 27 -- 30 40   Minutes -- 15 -- 15 15   METs -- 2.47 -- 2.63 3.18     Home Exercise Plan   Plans to continue exercise at -- -- Home (comment)  walking Home (comment)  walking Home (comment)  walking   Frequency -- -- Add 2 additional days to program exercise sessions. Add 2 additional days to program exercise sessions. Add 2 additional days to program exercise sessions.   Initial Home Exercises Provided -- -- 07/04/22 07/04/22 07/04/22     Oxygen   Maintain Oxygen Saturation -- 88% or higher 88% or higher 88% or higher 88% or higher    Row Name 08/14/22 1400 09/01/22 0700 09/14/22 0700 09/28/22 1100 10/11/22 1100     Response to Exercise   Blood Pressure (Admit) 118/62 126/52 140/52 140/52 138/50   Blood Pressure (Exit) 100/56 134/72 126/40 128/50 138/56   Heart Rate (Admit) 65 bpm 62 bpm 63 bpm 67 bpm 60 bpm   Heart Rate (Exercise) 75 bpm 89 bpm 84 bpm 88  bpm 92 bpm   Heart Rate (Exit) 63 bpm 64 bpm 62 bpm 65 bpm 62 bpm   Oxygen Saturation (Admit) 94 % 95 % 96 % 95 % 96 %   Oxygen Saturation (Exercise) 90 % 90 % 90 % 90 % 90 %   Oxygen Saturation (Exit) 96 % 94 % 94 % 96 % 94 %   Rating of Perceived Exertion (Exercise) 13 14 13 15 13    Perceived Dyspnea (Exercise) -- -- 0 0 0   Symptoms none none none none none   Duration Continue with 30 min of aerobic exercise without signs/symptoms of physical distress. Continue with 30 min of aerobic exercise without signs/symptoms of physical distress. Continue with 30 min of aerobic exercise without signs/symptoms of physical distress. Continue with 30 min of aerobic exercise without signs/symptoms of physical distress. Continue with 30 min of aerobic exercise without signs/symptoms of physical distress.   Intensity THRR unchanged THRR unchanged THRR unchanged THRR unchanged THRR unchanged     Progression   Progression Continue to progress workloads to maintain intensity without signs/symptoms of physical distress. Continue to progress workloads to maintain intensity without signs/symptoms of physical distress. Continue to progress workloads to maintain intensity without signs/symptoms of physical distress. Continue to progress workloads to maintain intensity without signs/symptoms of physical distress. Continue to progress workloads to maintain intensity without signs/symptoms of physical distress.   Average METs 3.16 3.18 3.25 3.45 3.2     Resistance Training   Training Prescription Yes Yes Yes Yes Yes   Weight 3 lb 3 lb 3 lb 2 lb 2 lb   Reps 10-15 10-15 10-15 10-15 10-15     Interval Training   Interval Training No No No No No     Recumbant Bike   Level -- -- -- 2 2   Watts -- -- -- 19 19   Minutes -- -- -- 15 15     NuStep   Level 5 5 -- -- 5   Minutes 15 15 -- -- 15   METs 2.6 -- -- -- --     REL-XR   Level 3 1 2 1  --   Minutes 15 15 15 15  --   METs 4.5 4 4.1 4.5 --     Track   Laps  42 45  43 47 42   Minutes 15 15 15 15 15    METs 3.28 3.45 3.34 3.56 3.28     Home Exercise Plan   Plans to continue exercise at Home (comment)  walking Home (comment)  walking Home (comment)  walking Home (comment)  walking Home (comment)  walking   Frequency Add 2 additional days to program exercise sessions. Add 2 additional days to program exercise sessions. Add 2 additional days to program exercise sessions. Add 2 additional days to program exercise sessions. Add 2 additional days to program exercise sessions.   Initial Home Exercises Provided 07/04/22 07/04/22 07/04/22 07/04/22 07/04/22     Oxygen   Maintain Oxygen Saturation 88% or higher 88% or higher 88% or higher 88% or higher 88% or higher            Exercise Comments:   Exercise Comments     Row Name 06/20/22 1002 10/24/22 1001         Exercise Comments First full day of exercise!  Patient was oriented to gym and equipment including functions, settings, policies, and procedures.  Patient's individual exercise prescription and treatment plan were reviewed.  All starting workloads were established based on the results of the 6 minute walk test done at initial orientation visit.  The plan for exercise progression was also introduced and progression will be customized based on patient's performance and goals. Ambrie graduated today from  rehab with 36 sessions completed.  Details of the patient's exercise prescription and what She needs to do in order to continue the prescription and progress were discussed with patient.  Patient was given a copy of prescription and goals.  Patient verbalized understanding. Nyleah plans to continue to exercise by walking at home.               Exercise Goals and Review:   Exercise Goals     Row Name 06/14/22 1346             Exercise Goals   Increase Physical Activity Yes       Intervention Provide advice, education, support and counseling about physical activity/exercise  needs.;Develop an individualized exercise prescription for aerobic and resistive training based on initial evaluation findings, risk stratification, comorbidities and participant's personal goals.       Expected Outcomes Short Term: Attend rehab on a regular basis to increase amount of physical activity.;Long Term: Add in home exercise to make exercise part of routine and to increase amount of physical activity.;Long Term: Exercising regularly at least 3-5 days a week.       Increase Strength and Stamina Yes       Intervention Provide advice, education, support and counseling about physical activity/exercise needs.;Develop an individualized exercise prescription for aerobic and resistive training based on initial evaluation findings, risk stratification, comorbidities and participant's personal goals.       Expected Outcomes Short Term: Increase workloads from initial exercise prescription for resistance, speed, and METs.;Short Term: Perform resistance training exercises routinely during rehab and add in resistance training at home;Long Term: Improve cardiorespiratory fitness, muscular endurance and strength as measured by increased METs and functional capacity ( )       Able to understand and use rate of perceived exertion (RPE) scale Yes       Intervention Provide education and explanation on how to use RPE scale       Expected Outcomes Short Term: Able to use RPE daily in rehab to express subjective intensity level;Long Term:  Able to use  RPE to guide intensity level when exercising independently       Able to understand and use Dyspnea scale Yes       Intervention Provide education and explanation on how to use Dyspnea scale       Expected Outcomes Short Term: Able to use Dyspnea scale daily in rehab to express subjective sense of shortness of breath during exertion;Long Term: Able to use Dyspnea scale to guide intensity level when exercising independently       Knowledge and understanding of  Target Heart Rate Range (THRR) Yes       Intervention Provide education and explanation of THRR including how the numbers were predicted and where they are located for reference       Expected Outcomes Short Term: Able to state/look up THRR;Long Term: Able to use THRR to govern intensity when exercising independently;Short Term: Able to use daily as guideline for intensity in rehab       Able to check pulse independently Yes       Intervention Provide education and demonstration on how to check pulse in carotid and radial arteries.;Review the importance of being able to check your own pulse for safety during independent exercise       Expected Outcomes Short Term: Able to explain why pulse checking is important during independent exercise;Long Term: Able to check pulse independently and accurately       Understanding of Exercise Prescription Yes       Intervention Provide education, explanation, and written materials on patient's individual exercise prescription       Expected Outcomes Short Term: Able to explain program exercise prescription;Long Term: Able to explain home exercise prescription to exercise independently                Exercise Goals Re-Evaluation :  Exercise Goals Re-Evaluation     Row Name 06/20/22 1002 07/03/22 1236 07/04/22 1147 07/17/22 1435 08/01/22 0734     Exercise Goal Re-Evaluation   Exercise Goals Review Able to understand and use rate of perceived exertion (RPE) scale;Able to understand and use Dyspnea scale;Knowledge and understanding of Target Heart Rate Range (THRR);Understanding of Exercise Prescription Increase Physical Activity;Increase Strength and Stamina;Understanding of Exercise Prescription Increase Physical Activity;Increase Strength and Stamina;Understanding of Exercise Prescription Increase Physical Activity;Increase Strength and Stamina;Understanding of Exercise Prescription Increase Physical Activity;Increase Strength and Stamina;Understanding of  Exercise Prescription   Comments Reviewed RPE scale, THR and program prescription with pt today.  Pt voiced understanding and was given a copy of goals to take home. Kierre has done well for her first couple of weeks she has been at rehab. She has been able to reach 27 laps on the track- her goal is 30 and we hope to see her reach that! She also has exercised at level 1 on the both the XR and T4 Nustep. Her oxygen saturations are staying above 88% and RPEs are appropriate. Will continue to monitor. Reviewed home exercise with pt today.  Pt plans to walk for exercise at this time.  Aneisha was part of the [place] back in 2019 and is thinking about re-joining again. Reviewed THR, pulse, RPE, sign and symptoms, pulse oximetery and when to call 911 or MD.  Also discussed weather considerations and indoor options.  Pt voiced understanding. Lessli is doing well in the program. She recently increased her laps on the track to 30 laps. She also improved to level 2 on the T4 nustep. She has continued to use 3 lb hand weights  for resistance training as well. We will continue to monitor her progress in the program. Sueellen continues to do well in rehab.  She is now up to 40 laps aiming to get to a mile!  We will conitnue to monitor her progress.   Expected Outcomes Short: Use RPE daily to regulate intensity. Long: Follow program prescription in THR. Short: Increase laps to 30 Long: Increase overall MET level and stamina Short: monitor HR and O2 closely when exercising Long: Continue to exercise routinely independently at home Short: Continue to push for more laps on the track. Long: Continue to improve strength and stamina. Short: Aim for a mile walking Long: conitnue to improve stamina    Row Name 08/14/22 1454 09/01/22 0737 09/14/22 0722 09/28/22 1152 10/11/22 1133     Exercise Goal Re-Evaluation   Exercise Goals Review Increase Physical Activity;Increase Strength and Stamina;Understanding of Exercise  Prescription Increase Physical Activity;Increase Strength and Stamina;Understanding of Exercise Prescription Increase Physical Activity;Increase Strength and Stamina;Understanding of Exercise Prescription Increase Physical Activity;Increase Strength and Stamina;Understanding of Exercise Prescription Increase Physical Activity;Increase Strength and Stamina;Understanding of Exercise Prescription   Comments Maruska continues to do well in rehab. She is now walking up to 42 laps on the track. She also improved to level 5 on the T4 nustep and level 3 on the XR. She increased her overall average MET level to 3.16 METs as well. We will conitnue to monitor her progress. Sreeja is doing well in rehab. She continues to walk above 40 laps on the track with a max of 45 laps. She also has continued to do well with 3 lb hand weights and may benefit from increasing to 4 lb hand weights. We will continue to monitor her progress in the program. Garcia continues to do well in rehab. She recently increased her overall average MET level to 3.25 METs. She improved back up to level 2 on the XR and continues to walk more than 40 laps on the track. We will continue to monitor her progress in the program. Rachele continues to do well in rehab. She increased her laps on the track to 47 laps. She also has stayed consistent at level 1 on both the XR anf T5 nustep. She is due for her post as well, and will look to improve on it. We will continue to monitor her progress in the program. Britnee continues to do well in rehab and is close to graduating. She completed her post and improved by 44%! She also continues to walk above 40 laps on the track and has kept her workloads consistent on the T5 nustep and recumbent bike. We will continue to monitor her progress in the program.   Expected Outcomes Short: Continue to push for more laps on the track. Long: Continue to improve strength and stamina. Short: Try 4 lb hand weights for  resistance training. Long: Continue to improve strength and stamina. Short: Continue to push for more laps on track. Long: Continue exercise to improve strength and stamina. Short: Improve on post . Long: Continue exercise to improve strength and stamina. Short: Graduate. Long: Continue to exercise independently.            Discharge Exercise Prescription (Final Exercise Prescription Changes):  Exercise Prescription Changes - 10/11/22 1100       Response to Exercise   Blood Pressure (Admit) 138/50    Blood Pressure (Exit) 138/56    Heart Rate (Admit) 60 bpm    Heart Rate (Exercise) 92 bpm  Heart Rate (Exit) 62 bpm    Oxygen Saturation (Admit) 96 %    Oxygen Saturation (Exercise) 90 %    Oxygen Saturation (Exit) 94 %    Rating of Perceived Exertion (Exercise) 13    Perceived Dyspnea (Exercise) 0    Symptoms none    Duration Continue with 30 min of aerobic exercise without signs/symptoms of physical distress.    Intensity THRR unchanged      Progression   Progression Continue to progress workloads to maintain intensity without signs/symptoms of physical distress.    Average METs 3.2      Resistance Training   Training Prescription Yes    Weight 2 lb    Reps 10-15      Interval Training   Interval Training No      Recumbant Bike   Level 2    Watts 19    Minutes 15      NuStep   Level 5    Minutes 15      Track   Laps 42    Minutes 15    METs 3.28      Home Exercise Plan   Plans to continue exercise at Home (comment)   walking   Frequency Add 2 additional days to program exercise sessions.    Initial Home Exercises Provided 07/04/22      Oxygen   Maintain Oxygen Saturation 88% or higher             Nutrition:  Target Goals: Understanding of nutrition guidelines, daily intake of sodium 1500mg , cholesterol 200mg , calories 30% from fat and 7% or less from saturated fats, daily to have 5 or more servings of fruits and vegetables.  Education: All  About Nutrition: -Group instruction provided by verbal, written material, interactive activities, discussions, models, and posters to present general guidelines for heart healthy nutrition including fat, fiber, MyPlate, the role of sodium in heart healthy nutrition, utilization of the nutrition label, and utilization of this knowledge for meal planning. Follow up email sent as well. Written material given at graduation. Flowsheet Row Pulmonary Rehab from 08/10/2022 in [facility] Cardiac and Pulmonary Rehab  Education need identified 06/14/22       Biometrics:  Pre Biometrics - 06/14/22 1346       Pre Biometrics   Height 5\' 3"  (1.6 m)    Weight 126 lb 11.2 oz (57.5 kg)    Waist Circumference 32.5 inches    Hip Circumference 37 inches    Waist to Hip Ratio 0.88 %    BMI (Calculated) 22.45    Single Leg Stand 6.81 seconds             Post Biometrics - 10/03/22 1018        Post  Biometrics   Height 5\' 3"  (1.6 m)    Weight 125 lb 14.4 oz (57.1 kg)    Waist Circumference 31 inches    Hip Circumference 35.5 inches    Waist to Hip Ratio 0.87 %    BMI (Calculated) 22.31    Single Leg Stand 8.88 seconds             Nutrition Therapy Plan and Nutrition Goals:  Nutrition Therapy & Goals - 06/14/22 1442       Intervention Plan   Intervention Prescribe, educate and counsel regarding individualized specific dietary modifications aiming towards targeted core components such as weight, hypertension, lipid management, diabetes, heart failure and other comorbidities.    Expected Outcomes Short Term Goal: Understand  basic principles of dietary content, such as calories, fat, sodium, cholesterol and nutrients.;Short Term Goal: A plan has been developed with personal nutrition goals set during dietitian appointment.;Long Term Goal: Adherence to prescribed nutrition plan.             Nutrition Assessments:  MEDIFICTS Score Key: ?70 Need to make dietary changes  40-70 Heart Healthy  Diet ? 40 Therapeutic Level Cholesterol Diet  Flowsheet Row Pulmonary Rehab from 06/14/2022 in [facility] Cardiac and Pulmonary Rehab  Picture Your Plate Total Score on Admission 68      Picture Your Plate Scores: <95 Unhealthy dietary pattern with much room for improvement. 41-50 Dietary pattern unlikely to meet recommendations for good health and room for improvement. 51-60 More healthful dietary pattern, with some room for improvement.  >60 Healthy dietary pattern, although there may be some specific behaviors that could be improved.   Nutrition Goals Re-Evaluation:  Nutrition Goals Re-Evaluation     Row Name 07/04/22 1031 07/27/22 1022 09/14/22 1019         Goals   Current Weight -- 126 lb (57.2 kg) --     Nutrition Goal Patient is interested in meeting with the RD, we are currently in transition in hiring a new dietician and once they are on board, will meet with the patient. Patient would like to focus more on cooking at home and specifically for herself only. Eat smaller more frequent portion sizes. --     Comment Caisey states she generally follows a "diabetic friendly diet" because her and her husband managed it together for him until he passed. She already focuses on smaller frequent meals. She admits she does go out and get food a lot which could consist of salads with chicken, bean salads. We talked about being mindful about sodium as restaurant food can have a higher sodium content. Now that she is living by herself, she struggles with cooking for herself only. She does freeze her leftovers to help not waste food. I did provide her a couple handouts on healthy budget eating and tips to review. She is interested in meeting with the RD, in which we are in transition with at this time. Patient was informed on why it is important to maintain a balanced diet when dealing with Respiratory issues. Explained that it takes a lot of energy to breath and when they are short of breath often they  will need to have a good diet to help keep up with the calories they are expending for breathing. Patient defers RD appointment     Expected Outcome Short: Review paperwork Long: Continue to eat healthy pulmonary based diet Short: Choose and plan snacks accordingly to patients caloric intake to improve breathing. Long: Maintain a diet independently that meets their caloric intake to aid in daily shortness of breath. --              Nutrition Goals Discharge (Final Nutrition Goals Re-Evaluation):  Nutrition Goals Re-Evaluation - 09/14/22 1019       Goals   Comment Patient defers RD appointment             Psychosocial: Target Goals: Acknowledge presence or absence of significant depression and/or stress, maximize coping skills, provide positive support system. Participant is able to verbalize types and ability to use techniques and skills needed for reducing stress and depression.   Education: Stress, Anxiety, and Depression - Group verbal and visual presentation to define topics covered.  Reviews how body is impacted by stress,  anxiety, and depression.  Also discusses healthy ways to reduce stress and to treat/manage anxiety and depression.  Written material given at graduation.   Education: Sleep Hygiene -Provides group verbal and written instruction about how sleep can affect your health.  Define sleep hygiene, discuss sleep cycles and impact of sleep habits. Review good sleep hygiene tips.    Initial Review & Psychosocial Screening:  Initial Psych Review & Screening - 06/07/22 1009       Initial Review   Current issues with Current Stress Concerns      Family Dynamics   Good Support System? Yes   2 daughters, neighbor     Barriers   Psychosocial barriers to participate in program There are no identifiable barriers or psychosocial needs.      Screening Interventions   Interventions Encouraged to exercise;To provide support and resources with identified psychosocial  needs;Provide feedback about the scores to participant    Expected Outcomes Short Term goal: Utilizing psychosocial counselor, staff and physician to assist with identification of specific Stressors or current issues interfering with healing process. Setting desired goal for each stressor or current issue identified.;Long Term Goal: Stressors or current issues are controlled or eliminated.;Short Term goal: Identification and review with participant of any Quality of Life or Depression concerns found by scoring the questionnaire.;Long Term goal: The participant improves quality of Life and PHQ9 Scores as seen by post scores and/or verbalization of changes             Quality of Life Scores:  Scores of 19 and below usually indicate a poorer quality of life in these areas.  A difference of  2-3 points is a clinically meaningful difference.  A difference of 2-3 points in the total score of the Quality of Life Index has been associated with significant improvement in overall quality of life, self-image, physical symptoms, and general health in studies assessing change in quality of life.  PHQ-9: Review Flowsheet  More data exists      06/14/2022 12/19/2021 05/31/2016 05/28/2015 05/20/2014  Depression screen PHQ 2/9  Decreased Interest 0 0 0 0 0 0 0  Down, Depressed, Hopeless 0 0 0 0 0 0 0  PHQ - 2 Score 0 0 0 0 0 0 0  Altered sleeping 0 - - - -  Tired, decreased energy 1 - - - -  Change in appetite 1 - - - -  Feeling bad or failure about yourself  0 - - - -  Trouble concentrating 0 - - - -  Moving slowly or fidgety/restless 1 - - - -  Suicidal thoughts 0 - - - -  PHQ-9 Score 3 - - - -  Difficult doing work/chores Not difficult at all - - - -    Details       Multiple values from one day are sorted in reverse-chronological order        Interpretation of Total Score  Total Score Depression Severity:  1-4 = Minimal depression, 5-9 = Mild depression, 10-14 = Moderate depression, 15-19 =  Moderately severe depression, 20-27 = Severe depression   Psychosocial Evaluation and Intervention:  Psychosocial Evaluation - 06/07/22 1036       Psychosocial Evaluation & Interventions   Interventions Encouraged to exercise with the program and follow exercise prescription    Comments Nakeyah is coming to pulmonary rehab with the diagnosis of pulmonary HTN. She has been managing her heart failure symptoms well. She has noticed her breathing has gotten better with  fluid managment. She does report her biggest stressor coming to the program is "white coat syndrome," staff reassured her that we will take it day by day and keep that in mind with her blood pressure readings. She has a very supportive family and neighbor who are there if she needs anything. She lives alone and does most of her own care and housekeeping.    Expected Outcomes Short: attend pulmonary rehab for education and exercise. Long: develop and maintain positive self care habits.    Continue Psychosocial Services  Follow up required by staff             Psychosocial Re-Evaluation:  Psychosocial Re-Evaluation     Row Name 07/04/22 1052 07/27/22 1020 09/14/22 1018         Psychosocial Re-Evaluation   Current issues with Current Sleep Concerns None Identified;Current Psychotropic Meds None Identified;Current Psychotropic Meds     Comments Amielia is doing well mentally. She does have issues with sleep as she experiences bladder issues that causes her to get up multiple times during the night to use the bathroom. Her and her doctor talked about it and they are not pursing anything at this time. She knows to speak up if it becomes a bigger issue. She stays compliant using her oxygen at night. Her doctor encouraged her to take naps during the day if that helps. Patient takes zoloft which helps. She has good support from her 2 of her daughters. She has been enjoying the program thus far and denies other concerns at this time.  Patient reports no issues with their current mental states, sleep, stress, depression or anxiety. Will follow up with patient in a few weeks for any changes. Patient reports no issues with their current mental states, sleep, stress, depression or anxiety. Will follow up with patient in a few weeks for any changes.     Expected Outcomes Short: Continue rehab attendance for mood boost Long: Continue to maintain a positive attitude Short: Continue to exercise regularly to support mental health and notify staff of any changes. Long: maintain mental health and well being through teaching of rehab or prescribed medications independently. Short: Continue to exercise regularly to support mental health and notify staff of any changes. Long: maintain mental health and well being through teaching of rehab or prescribed medications independently.     Interventions Encouraged to attend Pulmonary Rehabilitation for the exercise Encouraged to attend Pulmonary Rehabilitation for the exercise Encouraged to attend Pulmonary Rehabilitation for the exercise     Continue Psychosocial Services  Follow up required by staff Follow up required by staff Follow up required by staff              Psychosocial Discharge (Final Psychosocial Re-Evaluation):  Psychosocial Re-Evaluation - 09/14/22 1018       Psychosocial Re-Evaluation   Current issues with None Identified;Current Psychotropic Meds    Comments Patient reports no issues with their current mental states, sleep, stress, depression or anxiety. Will follow up with patient in a few weeks for any changes.    Expected Outcomes Short: Continue to exercise regularly to support mental health and notify staff of any changes. Long: maintain mental health and well being through teaching of rehab or prescribed medications independently.    Interventions Encouraged to attend Pulmonary Rehabilitation for the exercise    Continue Psychosocial Services  Follow up required by staff              Education: Education Goals: Education classes will  be provided on a weekly basis, covering required topics. Participant will state understanding/return demonstration of topics presented.  Learning Barriers/Preferences:  Learning Barriers/Preferences - 06/07/22 1006       Learning Barriers/Preferences   Learning Barriers Sight    Learning Preferences None             General Pulmonary Education Topics:  Infection Prevention: - Provides verbal and written material to individual with discussion of infection control including proper hand washing and proper equipment cleaning during exercise session. Flowsheet Row Pulmonary Rehab from 08/10/2022 in [facility] Cardiac and Pulmonary Rehab  Date 06/14/22  Educator [facility]  Instruction Review Code 1- Verbalizes Understanding       Falls Prevention: - Provides verbal and written material to individual with discussion of falls prevention and safety. Flowsheet Row Pulmonary Rehab from 08/10/2022 in [facility] Cardiac and Pulmonary Rehab  Date 06/14/22  Educator [facility]  Instruction Review Code 1- Verbalizes Understanding       Chronic Lung Disease Review: - Group verbal instruction with posters, models, PowerPoint presentations and videos,  to review new updates, new respiratory medications, new advancements in procedures and treatments. Providing information on websites and "800" numbers for continued self-education. Includes information about supplement oxygen, available portable oxygen systems, continuous and intermittent flow rates, oxygen safety, concentrators, and Medicare reimbursement for oxygen. Explanation of Pulmonary Drugs, including class, frequency, complications, importance of spacers, rinsing mouth after steroid MDI's, and proper cleaning methods for nebulizers. Review of basic lung anatomy and physiology related to function, structure, and complications of lung disease. Review of risk factors. Discussion about methods for  diagnosing sleep apnea and types of masks and machines for OSA. Includes a review of the use of types of environmental controls: home humidity, furnaces, filters, dust mite/pet prevention, HEPA vacuums. Discussion about weather changes, air quality and the benefits of nasal washing. Instruction on Warning signs, infection symptoms, calling MD promptly, preventive modes, and value of vaccinations. Review of effective airway clearance, coughing and/or vibration techniques. Emphasizing that all should Create an Action Plan. Written material given at graduation. Flowsheet Row Pulmonary Rehab from 08/10/2022 in [facility] Cardiac and Pulmonary Rehab  Education need identified 06/14/22  Date 07/13/22  Educator [facility]  Instruction Review Code 1- Verbalizes Understanding       AED/CPR: - Group verbal and written instruction with the use of models to demonstrate the basic use of the AED with the basic ABC's of resuscitation.    Anatomy and Cardiac Procedures: - Group verbal and visual presentation and models provide information about basic cardiac anatomy and function. Reviews the testing methods done to diagnose heart disease and the outcomes of the test results. Describes the treatment choices: Medical Management, Angioplasty, or Coronary Bypass Surgery for treating various heart conditions including Myocardial Infarction, Angina, Valve Disease, and Cardiac Arrhythmias.  Written material given at graduation.   Medication Safety: - Group verbal and visual instruction to review commonly prescribed medications for heart and lung disease. Reviews the medication, class of the drug, and side effects. Includes the steps to properly store meds and maintain the prescription regimen.  Written material given at graduation.   Other: -Provides group and verbal instruction on various topics (see comments) Flowsheet Row Pulmonary Rehab from 08/10/2022 in [facility] Cardiac and Pulmonary Rehab  Date 08/10/22  Educator RELAXATION  [facility]  Instruction Review Code 1- Verbalizes Understanding       Knowledge Questionnaire Score:  Knowledge Questionnaire Score - 06/14/22 1440       Knowledge Questionnaire Score  Pre Score 8/18              Core Components/Risk Factors/Patient Goals at Admission:  Personal Goals and Risk Factors at Admission - 06/14/22 1442       Core Components/Risk Factors/Patient Goals on Admission    Weight Management Yes    Intervention Weight Management: Develop a combined nutrition and exercise program designed to reach desired caloric intake, while maintaining appropriate intake of nutrient and fiber, sodium and fats, and appropriate energy expenditure required for the weight goal.;Weight Management: Provide education and appropriate resources to help participant work on and attain dietary goals.    Admit Weight 126 lb 11.2 oz (57.5 kg)    Goal Weight: Short Term 127 lb (57.6 kg)    Goal Weight: Long Term 127 lb (57.6 kg)    Expected Outcomes Short Term: Continue to assess and modify interventions until short term weight is achieved;Long Term: Adherence to nutrition and physical activity/exercise program aimed toward attainment of established weight goal;Weight Maintenance: Understanding of the daily nutrition guidelines, which includes 25-35% calories from fat, 7% or less cal from saturated fats, less than 200mg  cholesterol, less than 1.5gm of sodium, & 5 or more servings of fruits and vegetables daily;Understanding recommendations for meals to include 15-35% energy as protein, 25-35% energy from fat, 35-60% energy from carbohydrates, less than 200mg  of dietary cholesterol, 20-35 gm of total fiber daily;Understanding of distribution of calorie intake throughout the day with the consumption of 4-5 meals/snacks    Heart Failure Yes    Intervention Provide a combined exercise and nutrition program that is supplemented with education, support and counseling about heart failure. Directed toward  relieving symptoms such as shortness of breath, decreased exercise tolerance, and extremity edema.    Expected Outcomes Improve functional capacity of life;Short term: Attendance in program 2-3 days a week with increased exercise capacity. Reported lower sodium intake. Reported increased fruit and vegetable intake. Reports medication compliance.;Short term: Daily weights obtained and reported for increase. Utilizing diuretic protocols set by physician.;Long term: Adoption of self-care skills and reduction of barriers for early signs and symptoms recognition and intervention leading to self-care maintenance.    Hypertension Yes    Intervention Provide education on lifestyle modifcations including regular physical activity/exercise, weight management, moderate sodium restriction and increased consumption of fresh fruit, vegetables, and low fat dairy, alcohol moderation, and smoking cessation.;Monitor prescription use compliance.    Expected Outcomes Short Term: Continued assessment and intervention until BP is < 140/75mm HG in hypertensive participants. < 130/73mm HG in hypertensive participants with diabetes, heart failure or chronic kidney disease.;Long Term: Maintenance of blood pressure at goal levels.    Lipids Yes    Intervention Provide education and support for participant on nutrition & aerobic/resistive exercise along with prescribed medications to achieve LDL 70mg , HDL >40mg .    Expected Outcomes Short Term: Participant states understanding of desired cholesterol values and is compliant with medications prescribed. Participant is following exercise prescription and nutrition guidelines.;Long Term: Cholesterol controlled with medications as prescribed, with individualized exercise RX and with personalized nutrition plan. Value goals: LDL < 70mg , HDL > 40 mg.             Education:Diabetes - Individual verbal and written instruction to review signs/symptoms of diabetes, desired ranges of  glucose level fasting, after meals and with exercise. Acknowledge that pre and post exercise glucose checks will be done for 3 sessions at entry of program.   Know Your Numbers and Heart Failure: - Group verbal  and visual instruction to discuss disease risk factors for cardiac and pulmonary disease and treatment options.  Reviews associated critical values for Overweight/Obesity, Hypertension, Cholesterol, and Diabetes.  Discusses basics of heart failure: signs/symptoms and treatments.  Introduces Heart Failure Zone chart for action plan for heart failure.  Written material given at graduation.   Core Components/Risk Factors/Patient Goals Review:   Goals and Risk Factor Review     Row Name 07/04/22 1030 07/04/22 1031 07/27/22 1021 09/14/22 1020       Core Components/Risk Factors/Patient Goals Review   Personal Goals Review Hypertension;Heart Failure;Weight Management/Obesity -- Improve shortness of breath with ADL's Weight Management/Obesity    Review Ivianna is checking her BP at home and keeps a log of it at home. She usually runs 120-130s/60-70s. She is stable at rehab as well. She also watches her weight at home and is looking to maintain at this time, she usually ranges between 122-124 lb.  she is aware to be mindful of any sudden weight gain that could be potentially causes from fluid. She denies any heart failure symotoms. She is staying compliant taking all of her medications. -- Spoke to patient about their shortness of breath and what they can do to improve. Patient has been informed of breathing techniques when starting the program. Patient is informed to tell staff if they have had any med changes and that certain meds they are taking or not taking can be causing shortness of breath. Maliea wants to stay the same weight. Her blood pressure has been doing well and has been checking her blood pressure at home. Amiodarone is her only medications that she is concerned about. Her doctors  want her to stay on it. She has reduced it to 100 and is ok with it and knows it helps her. She was concerned about having labs. Infomred her that the Doctors dont always do labs after med changes.    Expected Outcomes Short: Continue to watch weight, look for any abnormal changes Long: Continue to manage lifestyle risk factors -- Short: Attend [facility] regularly to improve shortness of breath with ADL's. Long: maintain independence with ADL's Short: take antirhythmic medications. Long: maintain medications independently.             Core Components/Risk Factors/Patient Goals at Discharge (Final Review):   Goals and Risk Factor Review - 09/14/22 1020       Core Components/Risk Factors/Patient Goals Review   Personal Goals Review Weight Management/Obesity    Review Shaunte wants to stay the same weight. Her blood pressure has been doing well and has been checking her blood pressure at home. Amiodarone is her only medications that she is concerned about. Her doctors want her to stay on it. She has reduced it to 100 and is ok with it and knows it helps her. She was concerned about having labs. Infomred her that the Doctors dont always do labs after med changes.    Expected Outcomes Short: take antirhythmic medications. Long: maintain medications independently.             ITP Comments:  ITP Comments     Row Name 06/07/22 1048 06/14/22 1321 06/20/22 1001 07/12/22 0942 08/09/22 1134   ITP Comments Initial phone call completed. Diagnosis can be found in [facility] 4/3. EP Orientation scheduled for Wednesday 4/17 at 10:30. Completed and gym orientation. Initial ITP created and sent for review to Dr. Jinny Sanders, Medical Director. First full day of exercise!  Patient was oriented to gym and  equipment including functions, settings, policies, and procedures.  Patient's individual exercise prescription and treatment plan were reviewed.  All starting workloads were established based on the results  of the 6 minute walk test done at initial orientation visit.  The plan for exercise progression was also introduced and progression will be customized based on patient's performance and goals. 30 Day review completed. Medical Director ITP review done, changes made as directed, and signed approval by Medical Director.    new to program 30 Day review completed. Medical Director ITP review done, changes made as directed, and signed approval by Medical Director.    Row Name 09/05/22 1508 10/04/22 1208 10/24/22 1001       ITP Comments 30 Day review completed. Medical Director ITP review done, changes made as directed, and signed approval by Medical Director. 30 Day review completed. Medical Director ITP review done, changes made as directed, and signed approval by Medical Director. Syretta graduated today from  rehab with 36 sessions completed.  Details of the patient's exercise prescription and what She needs to do in order to continue the prescription and progress were discussed with patient.  Patient was given a copy of prescription and goals.  Patient verbalized understanding. Mirtie plans to continue to exercise by walking at home.              Comments: Discharge ITP

## 2022-10-26 ENCOUNTER — Ambulatory Visit: Payer: Medicare Other | Attending: Cardiovascular Disease

## 2022-10-26 DIAGNOSIS — R0989 Other specified symptoms and signs involving the circulatory and respiratory systems: Secondary | ICD-10-CM | POA: Diagnosis not present

## 2022-11-07 DIAGNOSIS — J301 Allergic rhinitis due to pollen: Secondary | ICD-10-CM | POA: Diagnosis not present

## 2022-11-07 DIAGNOSIS — J3081 Allergic rhinitis due to animal (cat) (dog) hair and dander: Secondary | ICD-10-CM | POA: Diagnosis not present

## 2022-11-07 DIAGNOSIS — J3089 Other allergic rhinitis: Secondary | ICD-10-CM | POA: Diagnosis not present

## 2022-11-13 ENCOUNTER — Encounter: Payer: Self-pay | Admitting: Internal Medicine

## 2022-11-13 ENCOUNTER — Ambulatory Visit: Payer: Medicare Other | Attending: Internal Medicine | Admitting: Internal Medicine

## 2022-11-13 VITALS — BP 170/56 | HR 66 | Resp 14 | Wt 125.4 lb

## 2022-11-13 DIAGNOSIS — I110 Hypertensive heart disease with heart failure: Secondary | ICD-10-CM | POA: Insufficient documentation

## 2022-11-13 DIAGNOSIS — I482 Chronic atrial fibrillation, unspecified: Secondary | ICD-10-CM | POA: Diagnosis present

## 2022-11-13 DIAGNOSIS — R0902 Hypoxemia: Secondary | ICD-10-CM | POA: Diagnosis not present

## 2022-11-13 DIAGNOSIS — I480 Paroxysmal atrial fibrillation: Secondary | ICD-10-CM | POA: Insufficient documentation

## 2022-11-13 DIAGNOSIS — I272 Pulmonary hypertension, unspecified: Secondary | ICD-10-CM | POA: Insufficient documentation

## 2022-11-13 DIAGNOSIS — I5032 Chronic diastolic (congestive) heart failure: Secondary | ICD-10-CM | POA: Diagnosis not present

## 2022-11-13 DIAGNOSIS — I340 Nonrheumatic mitral (valve) insufficiency: Secondary | ICD-10-CM | POA: Insufficient documentation

## 2022-11-13 MED ORDER — AMIODARONE HCL 200 MG PO TABS: 100.0000 mg | ORAL_TABLET | ORAL | Status: DC

## 2022-11-13 NOTE — Patient Instructions (Signed)
Medication Changes:  Decrease Amiodarone to 100 mg every other day   Lab Work:  Labs done today, your results will be available in MyChart, we will contact you for abnormal readings.  Testing/Procedures:  Your physician has requested that you have an echocardiogram. Echocardiography is a painless test that uses sound waves to create images of your heart. It provides your doctor with information about the size and shape of your heart and how well your heart's chambers and valves are working. This procedure takes approximately one hour. There are no restrictions for this procedure. Please do NOT wear cologne, perfume, aftershave, or lotions (deodorant is allowed). Please arrive 15 minutes prior to your appointment time. IN 6 MONTHS  Referrals:  none  Special Instructions // Education:  Do the following things EVERYDAY: Weigh yourself in the morning before breakfast. Write it down and keep it in a log. Take your medicines as prescribed Eat low salt foods--Limit salt (sodium) to 2000 mg per day.  Stay as active as you can everyday Limit all fluids for the day to less than 2 liters   Follow-Up in: 6 months with an echocardiogram (March 2025), We will call you closer to this time to schedule    If you have any questions or concerns before your next appointment please send [place] a message through [place] or call our office at [phone] Monday-Friday 8 am-5 pm.   If you have an urgent need after hours on the weekend please call your Primary Cardiologist or the [facility] in [place] at [phone].

## 2022-11-13 NOTE — Progress Notes (Signed)
ADVANCED HF CLINIC NOTE  Referring Physician: Dr. Mariah Rogers Primary Care: Melissa Schwalbe, MD Primary Cardiologist: Dr. Lorenz Rogers   HPI:  [name] is a 82 year old woman with chronic AF, HTN, diastolic HF, moderate MR. PAD referred by Dr. Mariah Rogers for further evaluation of her pulmonary HTN   Previously followed at [facility]. Previous echos with normal systolic function and severe MR. Transferred care to Dr. Mariah Rogers in 2023.   Developed SOB in 11/23. Saw Dr. Alphonsus Rogers. Felt to have HF. CXR showed pulmonary edema.   Saw Dr. Marcos Rogers and walk test on 01/31/22 with sats in the 80s. Did ONOX with sats down to 75% Started on nighttime O2 and lasix and Kcl. BNP 501   Saw Dr. Mariah Rogers on 02/06/22 and 03/01/21. Lasix increased to 20 bid. Lopressor decreased.  Echo 12/23 EF 60-65% LA moderately dilated. Moderate MR. G3 DD. RV normal. RVSP 63mm HG  Non smoker. Husband was a smoker. No h/o DVT/PE. No h/o connective tissue disorder.  I ordered hi-res CT at last visit (2/24)  Hi res CT 3/24: No ILD. Air trapping c/w small airway disease. + lung nodules. + CAC  Echo 5/24 EF 55-60% G2DD RV normal RVSP 44 Personally reviewed Sleep study 5/24 AHI 3.7  Stopped Jardiance due to bad rash on face and neck.   At last visit I started [place]. Had GI symptoms and early April so stopped med. Now back on it and tolerating.  Here with her daughters. Keeping fluid off. Feels ok. Says she can do anything she wants to as long she takes her time. Takes BP 2x/day and daily weight. BP 120/40s mostly. BP at Pulmonary Rehab 150-180/60s   4/24 1,080 (beginning Pulmonary Rehab)  8/24  1,555 (after Pulmonary Rehab)  PFTs 03/28/22 FEV1 1.21 (68%) FEVC 1.35 (56%) DLCO 60%  Past Medical History:  Diagnosis Date   Adenomatous colon polyp 2005   Allergy    perennial ; shots from Dr [name]   Aortic insufficiency    Hyperlipidemia    Hypertension    Murmur    a.  04/2011 Echo:  EF 55-60%, mild-mod ai/mr    Squamous cell carcinoma of skin 08/05/2020   left pretibia EDC   Uterine cancer (HCC)     Current Outpatient Medications  Medication Sig Dispense Refill   amiodarone (PACERONE) 200 MG tablet Take 0.5 tablets (100 mg total) by mouth daily. 45 tablet 1   amLODipine (NORVASC) 5 MG tablet Take 1 tablet (5 mg total) by mouth daily. Take additional tablet for BP greater than 140     apixaban (ELIQUIS) 2.5 MG TABS tablet Take 1 tablet (2.5 mg total) by mouth 2 (two) times daily. 60 tablet 3   EPINEPHrine 0.3 mg/0.3 mL IJ SOAJ injection Inject 0.3 mg into the muscle as needed for anaphylaxis.     famotidine (PEPCID) 20 MG tablet Take 20 mg by mouth daily.     FARXIGA 10 MG TABS tablet TAKE 1 TABLET BY MOUTH DAILY BEFORE BREAKFAST. 30 tablet 3   fexofenadine (ALLEGRA) 180 MG tablet Take 180 mg by mouth daily.     fluticasone furoate-vilanterol (BREO ELLIPTA) 100-25 MCG/ACT AEPB Inhale 1 puff into the lungs daily. (Patient taking differently: Inhale 1 puff into the lungs as needed.) 30 each 5   furosemide (LASIX) 20 MG tablet Take 1 tablet (20 mg total) by mouth daily. 180 tablet 2   glucosamine-chondroitin 500-400 MG tablet Take 1 tablet by mouth 2 (two) times daily. 1 pm  Krill Oil 500 MG CAPS Take by mouth.     mometasone (NASONEX) 50 MCG/ACT nasal spray Place 1 spray into the nose 2 (two) times daily. 17 g 2   Multiple Vitamin (MULTIVITAMIN) capsule Take 1 capsule by mouth daily.     OXYGEN Inhale into the lungs. 1 liter at bedtime     potassium chloride SA (KLOR-CON M20) 20 MEQ tablet Take 1 tablet (20 mEq total) by mouth daily. 90 tablet 3   pravastatin (PRAVACHOL) 40 MG tablet TAKE 1 TABLET BY MOUTH EVERYDAY AT BEDTIME (Patient taking differently: Once a day with Lasix) 90 tablet 3   prednisoLONE acetate (PRED FORTE) 1 % ophthalmic suspension Apply to eye.     sertraline (ZOLOFT) 25 MG tablet TAKE 1 TABLET BY MOUTH EVERY DAY 90 tablet 3   sodium chloride (MURO 128) 5 % ophthalmic solution  PLACE 1 DROP INTO RIGHT EYE 4 TIMES A DAY     valsartan (DIOVAN) 160 MG tablet TAKE 1 TABLET BY MOUTH 2 TIMES DAILY. 180 tablet 0   vitamin B-12 (CYANOCOBALAMIN) 500 MCG tablet Take 500 mcg by mouth daily.     No current facility-administered medications for this visit.    Allergies  Allergen Reactions   Amlodipine Besy-Benazepril Hcl Swelling    Lotrel caused swelling of feet & facial swelling unilaterally   Amlodipine Besy-Benazepril Hcl Other (See Comments)   Lotrel [Amlodipine Besy-Benazepril Hcl]    Molds & Smuts Other (See Comments)   Wasp Venom Swelling    Allergic to wasps      Social History   Socioeconomic History   Marital status: Widowed    Spouse name: Not on file   Number of children: 2   Years of education: Not on file   Highest education level: Not on file  Occupational History   Occupation: Warehouse manager for [employer]    Comment: Retired  Tobacco Use   Smoking status: Never    Passive exposure: Past   Smokeless tobacco: Never  Vaping Use   Vaping status: Never Used  Substance and Sexual Activity   Alcohol use: Yes    Comment: special events   Drug use: No   Sexual activity: Never  Other Topics Concern   Not on file  Social History Narrative   Widowed 12/19      Has living will   Children now would be health care POA   Would accept resuscitation but no prolonged ventilation   No tube feeds if cognitively unaware      2 daughters, 4 grandsons 1 great-grandson   Retired Warehouse manager from [employer]   Social Determinants of Health   Financial Resource Strain: Low Risk  (05/14/2018)   Received from [facility], [facility]   Overall Financial Resource Strain (CARDIA)    Difficulty of Paying Living Expenses: Not hard at all  Food Insecurity: No Food Insecurity (05/14/2018)   Received from [facility], [facility]   Hunger Vital Sign    Worried About Running Out of Food in the Last  Year: Never true    Ran Out of Food in the Last Year: Never true  Transportation Needs: No Transportation Needs (05/14/2018)   Received from [facility], [facility]   PRAPARE - Transportation    In the past 12 months, has lack of transportation kept you from medical appointments or from getting medications?: No    Lack of Transportation (Non-Medical): No  Physical Activity: Inactive (05/14/2018)   Received from [facility], [facility]   Exercise Vital Sign    Days of Exercise per Week: 0 days    Minutes of Exercise per Session: 0 min  Stress: No Stress Concern Present (05/14/2018)   Received from [facility], [facility]   [employer] of Occupational Health - Occupational Stress Questionnaire    Feeling of Stress : Not at all  Social Connections: Unknown (05/14/2018)   Received from [facility], [facility]   Social Connection and Isolation Panel [NHANES]    Frequency of Communication with Friends and Family: Patient declined    Frequency of Social Gatherings with Friends and Family: Patient declined    Attends Religious Services: Patient declined    Active Member of Clubs or Organizations: Patient declined    Attends Engineer, structural: Patient declined    Marital Status: Patient declined  Catering manager Violence: Not on file      Family History  Problem Relation Age of Onset   Stroke Mother 20   Heart failure Mother    Heart attack Father 12   Lung cancer Father    Cancer Maternal Aunt         X 2; Gyn & ? primary   Diabetes Maternal Grandmother    Esophageal cancer Neg Hx    Colon cancer Neg Hx    Stomach cancer Neg Hx    Rectal cancer Neg Hx     Vitals:   11/13/22 1215  BP: (!) 170/56  Pulse: 66  Resp: 14  SpO2: 99%  Weight: 125 lb 6 oz (56.9 kg)    Wt Readings from Last 3 Encounters:  11/13/22 125 lb  6 oz (56.9 kg)  10/16/22 122 lb (55.3 kg)  10/03/22 125 lb 14.4 oz (57.1 kg)    PHYSICAL EXAM: General:  Elderly Well appearing. No resp difficulty HEENT: normal Neck: supple. no JVD. Carotids 2+ bilat; no bruits. No lymphadenopathy or thryomegaly appreciated. Cor: PMI nondisplaced. Regular rate & rhythm. 2/6 AS s2  Lungs: clear Abdomen: soft, nontender, nondistended. No hepatosplenomegaly. No bruits or masses. Good bowel sounds. Extremities: no cyanosis, clubbing, rash, edema Neuro: alert & orientedx3, cranial nerves grossly intact. moves all 4 extremities w/o difficulty. Affect pleasant   ASSESSMENT & PLAN:   1. 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 RV is normal - mainstay of therapy is volume control and BP management - However, recent PFTs with DLCO show restrictive physiology with a diffusion defect suggestive of interstitial process  - Echo 12/23 EF 60-65% LA moderately dilated. Moderate MR. G3 DD. RV normal. RVSP 63mm HG - Hi res CT 3/24: No ILD. Air trapping c/w small airway disease. + lung nodules. + CAC - Echo 5/24 EF 55-60% G2DD RV normal RVSP 44 Personally reviewed - Functional capacity much improved with diuresis and Pulmonary Rehab NYHA I-II - Volume status ok. Continue lasix 20 daily. - Jardiance stopped due to rash/allergy.  - Tolerating Farxiga well  - Chest CT 3/24 no ILD  - Echo 5/24 EF 55-60% G2DD RV normal RVSP 44  Mod to severe MR Personally reviewed - Sleep study 5/24 AHI 3.7  - No role for  R +/- L heart cath at this time  - much improved with PR. Encouraged her to continue to be active   2. Diastolic HF -  Doing well. Volume OK - NYHA I-II  3.  Paroxysmal AF - In NSR today. Continue amio 100 and Eliquis - Managed by Dr. Mariah Rogers - Given normal EF and underlying lung disease, I previously raised th e possibility of switching off amio to flecainide but Dr. Mariah Rogers discussed this with her and  she preferred not to change.  - Will decrease amio to 100 every other day.  - ECG and amio labs today  4. Hypoxemia - followed by Dr. Marcos Rogers - likely multifactorial - Sleep study 5/24 AHI 3.7 - plan as above  5. HTN - Blood pressure high today here and at Pulmonary Rehab.  - But BP at home well controlled  - Asked her to get her BP machine checked   6. Mitral regurgitation - moderate to severe - essentially asymptomatic - continue to follow    Arvilla Meres, MD  12:18 PM

## 2022-11-14 ENCOUNTER — Telehealth: Payer: Self-pay

## 2022-11-14 DIAGNOSIS — I5032 Chronic diastolic (congestive) heart failure: Secondary | ICD-10-CM

## 2022-11-14 LAB — CBC
Hematocrit: 32.5 % — ABNORMAL LOW (ref 34.0–46.6)
Hemoglobin: 11.1 g/dL (ref 11.1–15.9)
MCH: 33.8 pg — ABNORMAL HIGH (ref 26.6–33.0)
MCHC: 34.2 g/dL (ref 31.5–35.7)
MCV: 99 fL — ABNORMAL HIGH (ref 79–97)
NRBC: 1 % — ABNORMAL HIGH (ref 0–0)
Platelets: 239 10*3/uL (ref 150–450)
RBC: 3.28 x10E6/uL — ABNORMAL LOW (ref 3.77–5.28)
RDW: 17.5 % — ABNORMAL HIGH (ref 11.7–15.4)
WBC: 5.2 10*3/uL (ref 3.4–10.8)

## 2022-11-14 LAB — COMPREHENSIVE METABOLIC PANEL
ALT: 28 IU/L (ref 0–32)
AST: 24 IU/L (ref 0–40)
Albumin: 4.8 g/dL — ABNORMAL HIGH (ref 3.7–4.7)
Alkaline Phosphatase: 47 IU/L (ref 44–121)
BUN/Creatinine Ratio: 28 (ref 12–28)
BUN: 25 mg/dL (ref 8–27)
Bilirubin Total: 0.8 mg/dL (ref 0.0–1.2)
CO2: 25 mmol/L (ref 20–29)
Calcium: 9.3 mg/dL (ref 8.7–10.3)
Chloride: 101 mmol/L (ref 96–106)
Creatinine, Ser: 0.88 mg/dL (ref 0.57–1.00)
Globulin, Total: 1.9 g/dL (ref 1.5–4.5)
Glucose: 87 mg/dL (ref 70–99)
Potassium: 4.5 mmol/L (ref 3.5–5.2)
Sodium: 139 mmol/L (ref 134–144)
Total Protein: 6.7 g/dL (ref 6.0–8.5)
eGFR: 65 mL/min/{1.73_m2} (ref >59)

## 2022-11-14 LAB — T3, FREE: T3, Free: 2.3 pg/mL (ref 2.0–4.4)

## 2022-11-14 LAB — TSH: TSH: 6.14 u[IU]/mL — ABNORMAL HIGH (ref 0.450–4.500)

## 2022-11-14 LAB — T4, FREE: Free T4: 1.36 ng/dL (ref 0.82–1.77)

## 2022-11-14 NOTE — Telephone Encounter (Addendum)
Pt aware, agreeable, and verbalized understanding   ----- Message from Arvilla Meres  ----- TSH high but T4 normal. Please repeat in 6 weeks please

## 2022-11-26 ENCOUNTER — Other Ambulatory Visit: Payer: Self-pay | Admitting: Cardiovascular Disease

## 2022-11-28 DIAGNOSIS — J3089 Other allergic rhinitis: Secondary | ICD-10-CM | POA: Diagnosis not present

## 2022-11-28 DIAGNOSIS — J301 Allergic rhinitis due to pollen: Secondary | ICD-10-CM | POA: Diagnosis not present

## 2022-11-28 DIAGNOSIS — J3081 Allergic rhinitis due to animal (cat) (dog) hair and dander: Secondary | ICD-10-CM | POA: Diagnosis not present

## 2022-12-16 ENCOUNTER — Other Ambulatory Visit: Payer: Self-pay | Admitting: Pulmonary Disease

## 2022-12-25 ENCOUNTER — Ambulatory Visit (INDEPENDENT_AMBULATORY_CARE_PROVIDER_SITE_OTHER): Payer: Medicare Other | Admitting: Internal Medicine

## 2022-12-25 ENCOUNTER — Encounter: Payer: Self-pay | Admitting: Internal Medicine

## 2022-12-25 VITALS — BP 138/68 | HR 76 | Temp 97.8°F | Ht 62.5 in | Wt 120.0 lb

## 2022-12-25 DIAGNOSIS — Z23 Encounter for immunization: Secondary | ICD-10-CM | POA: Diagnosis not present

## 2022-12-25 DIAGNOSIS — I480 Paroxysmal atrial fibrillation: Secondary | ICD-10-CM

## 2022-12-25 DIAGNOSIS — Z0000 Encounter for general adult medical examination without abnormal findings: Secondary | ICD-10-CM | POA: Diagnosis not present

## 2022-12-25 DIAGNOSIS — F39 Unspecified mood [affective] disorder: Secondary | ICD-10-CM | POA: Diagnosis not present

## 2022-12-25 DIAGNOSIS — I779 Disorder of arteries and arterioles, unspecified: Secondary | ICD-10-CM

## 2022-12-25 DIAGNOSIS — I272 Pulmonary hypertension, unspecified: Secondary | ICD-10-CM

## 2022-12-25 NOTE — Progress Notes (Signed)
Hearing Screening - Comments:: Has hearing aids. Wearing them today. Vision Screening - Comments:: July 2024

## 2022-12-25 NOTE — Assessment & Plan Note (Signed)
I have personally reviewed the Medicare Annual Wellness questionnaire and have noted 1. The patient's medical and social history 2. Their use of alcohol, tobacco or illicit drugs 3. Their current medications and supplements 4. The patient's functional ability including ADL's, fall risks, home safety risks and hearing or visual             impairment. 5. Diet and physical activities 6. Evidence for depression or mood disorders  The patients weight, height, BMI and visual acuity have been recorded in the chart I have made referrals, counseling and provided education to the patient based review of the above and I have provided the pt with a written personalized care plan for preventive services.  I have provided you with a copy of your personalized plan for preventive services. Please take the time to review along with your updated medication list.  Done with cancer screening Flu vaccine today Holding off on COVID update Td at the pharmacy

## 2022-12-25 NOTE — Assessment & Plan Note (Signed)
Regular now on low dose amiodarone Continues on the eliquis 2.5mg  bid

## 2022-12-25 NOTE — Assessment & Plan Note (Signed)
Initially used for complicated grieving Now sertraline 25mg  daily is helping mild chronic anxiety

## 2022-12-25 NOTE — Assessment & Plan Note (Signed)
On imaging Is on pravastatin 40

## 2022-12-25 NOTE — Assessment & Plan Note (Signed)
Doing better now On oxygen and furosemide 20mg  daily

## 2022-12-25 NOTE — Progress Notes (Signed)
Subjective:    Patient ID: [name], female    DOB: May 26, 1940, 82 y.o.   MRN: [id number]  HPI Here for Medicare wellness visit and follow up of chronic health conditions With daughter Faith Reviewed advanced directives Reviewed other doctors--Dre Bensihmon--cardiology/pulm HTN, Dr Gollan--cardiology, Dr Wyn Quaker, Dr Sylvie Farrier, Dr Thomasene Lot, Dr Charlsie Merles, Dr Graciella Freer, Dr Roosevelt Locks No hospitalizations or surgery in the past year Did pulmonary rehab---now goes there on her own Vision is fine Hearing is pretty good No falls No depression or anhedonia--just gets bored at times Sip of wine daily--no other alcohol No tobacco Independent with instrumental ADLs No sig memory issues  Doing better with the SOB Pulmonary HTN diagnosis Takes furosemide for the fluid Sleeps with oxygen as well--from pulmonary (nocturnal hypoxia) No chest pain Breathing is okay now No palpitations now--amiodarone dose has been cut (100mg  every other day)  Anxiety is under control Low dose zoloft (daughter actually suggested the wine to help her stay calm)  Current Outpatient Medications on File Prior to Visit  Medication Sig Dispense Refill   amiodarone (PACERONE) 200 MG tablet Take 0.5 tablets (100 mg total) by mouth every other day.     amLODipine (NORVASC) 5 MG tablet Take 1 tablet (5 mg total) by mouth daily. Take additional tablet for BP greater than 140     apixaban (ELIQUIS) 2.5 MG TABS tablet Take 1 tablet (2.5 mg total) by mouth 2 (two) times daily. 60 tablet 3   EPINEPHrine 0.3 mg/0.3 mL IJ SOAJ injection Inject 0.3 mg into the muscle as needed for anaphylaxis.     famotidine (PEPCID) 20 MG tablet Take 20 mg by mouth daily.     FARXIGA 10 MG TABS tablet TAKE 1 TABLET BY MOUTH DAILY BEFORE BREAKFAST. 30 tablet 3   fexofenadine (ALLEGRA) 180 MG tablet Take 180 mg by mouth daily.     fluticasone furoate-vilanterol (BREO ELLIPTA) 100-25 MCG/ACT AEPB  Inhale 1 puff into the lungs daily. (Patient taking differently: Inhale 1 puff into the lungs as needed.) 30 each 5   furosemide (LASIX) 20 MG tablet Take 1 tablet (20 mg total) by mouth daily. 180 tablet 2   glucosamine-chondroitin 500-400 MG tablet Take 1 tablet by mouth 2 (two) times daily. 1 pm     Krill Oil 500 MG CAPS Take by mouth.     mometasone (NASONEX) 50 MCG/ACT nasal spray PLACE 1 SPRAY INTO THE NOSE 2 (TWO) TIMES DAILY. 51 each 1   Multiple Vitamin (MULTIVITAMIN) capsule Take 1 capsule by mouth daily.     OXYGEN Inhale into the lungs. 1 liter at bedtime     potassium chloride SA (KLOR-CON M20) 20 MEQ tablet Take 1 tablet (20 mEq total) by mouth daily. 90 tablet 3   pravastatin (PRAVACHOL) 40 MG tablet TAKE 1 TABLET BY MOUTH EVERYDAY AT BEDTIME (Patient taking differently: Once a day with Lasix) 90 tablet 3   prednisoLONE acetate (PRED FORTE) 1 % ophthalmic suspension Apply to eye.     sertraline (ZOLOFT) 25 MG tablet TAKE 1 TABLET BY MOUTH EVERY DAY 90 tablet 3   sodium chloride (MURO 128) 5 % ophthalmic solution Place 1 drop into the left eye 2 (two) times daily.     valsartan (DIOVAN) 160 MG tablet TAKE 1 TABLET BY MOUTH TWICE A DAY 180 tablet 0   vitamin B-12 (CYANOCOBALAMIN) 500 MCG tablet Take 500 mcg by mouth daily.     No current facility-administered medications on file prior to visit.    Allergies  Allergen Reactions   Amlodipine Besy-Benazepril Hcl Swelling    Lotrel caused swelling of feet & facial swelling unilaterally   Amlodipine Besy-Benazepril Hcl Other (See Comments)   Lotrel [Amlodipine Besy-Benazepril Hcl]    Molds & Smuts Other (See Comments)   Wasp Venom Swelling    Allergic to wasps    Past Medical History:  Diagnosis Date   Adenomatous colon polyp 2005   Allergy    perennial ; shots from Dr Corinda Gubler   Aortic insufficiency    Hyperlipidemia    Hypertension    Murmur    a.  04/2011 Echo:  EF 55-60%, mild-mod ai/mr   Squamous cell carcinoma of  skin 08/05/2020   left pretibia EDC   Uterine cancer (HCC)     Past Surgical History:  Procedure Laterality Date   BLADDER SUSPENSION     CARPAL TUNNEL RELEASE Left    COLONOSCOPY  multiple   GLAUCOMA SURGERY     "for narrow lines" (? narrow angle?)   INTRAOCULAR LENS INSERTION Bilateral 10/22/2014; 11/26/2014   TOTAL ABDOMINAL HYSTERECTOMY W/ BILATERAL SALPINGOOPHORECTOMY     abnormal PAP    Family History  Problem Relation Age of Onset   Stroke Mother 42   Heart failure Mother    Heart attack Father 72   Lung cancer Father    Cancer Maternal Aunt         X 2; Gyn & ? primary   Diabetes Maternal Grandmother    Esophageal cancer Neg Hx    Colon cancer Neg Hx    Stomach cancer Neg Hx    Rectal cancer Neg Hx     Social History   Socioeconomic History   Marital status: Widowed    Spouse name: Not on file   Number of children: 2   Years of education: Not on file   Highest education level: Not on file  Occupational History   Occupation: Warehouse manager for [employer]    Comment: Retired  Tobacco Use   Smoking status: Never    Passive exposure: Past   Smokeless tobacco: Never  Vaping Use   Vaping status: Never Used  Substance and Sexual Activity   Alcohol use: Yes    Comment: special events   Drug use: No   Sexual activity: Never  Other Topics Concern   Not on file  Social History Narrative   Widowed 12/19      Has living will   Children now would be health care POA   Would accept resuscitation but no prolonged ventilation   No tube feeds if cognitively unaware      2 daughters, 4 grandsons 1 great-grandson   Retired Warehouse manager from [employer]   Social Determinants of Health   Financial Resource Strain: Low Risk  (05/14/2018)   Received from [facility], [facility]   Overall Financial Resource Strain (CARDIA)    Difficulty of Paying Living Expenses: Not hard at all  Food Insecurity: No Food Insecurity (05/14/2018)    Received from [facility], [facility]   Hunger Vital Sign    Worried About Running Out of Food in the Last Year: Never true    Ran Out of Food in the Last Year: Never true  Transportation Needs: No Transportation Needs (05/14/2018)   Received from [facility], [facility]   PRAPARE - Transportation    In the past 12 months, has lack of transportation kept you from  medical appointments or from getting medications?: No    Lack of Transportation (Non-Medical): No  Physical Activity: Inactive (05/14/2018)   Received from [facility], [facility]   Exercise Vital Sign    Days of Exercise per Week: 0 days    Minutes of Exercise per Session: 0 min  Stress: No Stress Concern Present (05/14/2018)   Received from [facility], [facility]   [employer] of Occupational Health - Occupational Stress Questionnaire    Feeling of Stress : Not at all  Social Connections: Unknown (05/14/2018)   Received from [facility], [facility]   Social Connection and Isolation Panel [NHANES]    Frequency of Communication with Friends and Family: Patient declined    Frequency of Social Gatherings with Friends and Family: Patient declined    Attends Religious Services: Patient declined    Active Member of Clubs or Organizations: Patient declined    Attends Engineer, structural: Patient declined    Marital Status: Patient declined  Catering manager Violence: Not on file   Review of Systems Appetite is good Weight is down slightly--relates to fluid (she is careful about what she drinks) Sleeps okay--occasional nocturia Wears seat belt Teeth okay--keeps up with dentist No suspicious skin lesions No heartburn or dysphagia Bowels move fine--no blood Voids okay---no incontinence Some back pain--no Rx in general (has voltaren  gel for rare use)    Objective:   Physical Exam Constitutional:      Appearance: Normal appearance.  HENT:     Mouth/Throat:     Pharynx: No oropharyngeal exudate or posterior oropharyngeal erythema.  Eyes:     Conjunctiva/sclera: Conjunctivae normal.     Pupils: Pupils are equal, round, and reactive to light.  Cardiovascular:     Rate and Rhythm: Normal rate and regular rhythm.     Pulses: Normal pulses.     Heart sounds: No murmur heard.    No gallop.     Comments: Rare extra beats Pulmonary:     Effort: Pulmonary effort is normal.     Breath sounds: Normal breath sounds. No wheezing or rales.  Abdominal:     Palpations: Abdomen is soft.     Tenderness: There is no abdominal tenderness.  Musculoskeletal:     Cervical back: Neck supple.     Right lower leg: No edema.     Left lower leg: No edema.  Lymphadenopathy:     Cervical: No cervical adenopathy.  Skin:    Findings: No rash.  Neurological:     General: No focal deficit present.     Mental Status: She is alert and oriented to person, place, and time.     Comments: Word naming  7/1 minute Recall 3/3  Psychiatric:        Mood and Affect: Mood normal.        Behavior: Behavior normal.            Assessment & Plan:

## 2022-12-26 ENCOUNTER — Encounter: Payer: Self-pay | Admitting: Pulmonary Disease

## 2022-12-26 ENCOUNTER — Ambulatory Visit (INDEPENDENT_AMBULATORY_CARE_PROVIDER_SITE_OTHER): Payer: Medicare Other | Admitting: Pulmonary Disease

## 2022-12-26 VITALS — BP 150/60 | HR 75 | Temp 98.1°F | Ht 62.5 in | Wt 124.4 lb

## 2022-12-26 DIAGNOSIS — G4736 Sleep related hypoventilation in conditions classified elsewhere: Secondary | ICD-10-CM | POA: Diagnosis not present

## 2022-12-26 DIAGNOSIS — I2729 Other secondary pulmonary hypertension: Secondary | ICD-10-CM | POA: Diagnosis not present

## 2022-12-26 DIAGNOSIS — J3089 Other allergic rhinitis: Secondary | ICD-10-CM | POA: Diagnosis not present

## 2022-12-26 DIAGNOSIS — I5032 Chronic diastolic (congestive) heart failure: Secondary | ICD-10-CM

## 2022-12-26 DIAGNOSIS — J301 Allergic rhinitis due to pollen: Secondary | ICD-10-CM | POA: Diagnosis not present

## 2022-12-26 DIAGNOSIS — I272 Pulmonary hypertension, unspecified: Secondary | ICD-10-CM | POA: Diagnosis not present

## 2022-12-26 DIAGNOSIS — I340 Nonrheumatic mitral (valve) insufficiency: Secondary | ICD-10-CM

## 2022-12-26 DIAGNOSIS — J4530 Mild persistent asthma, uncomplicated: Secondary | ICD-10-CM

## 2022-12-26 DIAGNOSIS — J3081 Allergic rhinitis due to animal (cat) (dog) hair and dander: Secondary | ICD-10-CM | POA: Diagnosis not present

## 2022-12-26 LAB — NITRIC OXIDE: Nitric Oxide: 13

## 2022-12-26 NOTE — Progress Notes (Signed)
Subjective:    Patient ID: [name], female    DOB: 14-Dec-1940, 82 y.o.   MRN: [id number]  Patient Care Team: Karie Schwalbe, MD as PCP - General (Internal Medicine) Salena Saner, MD as Consulting Physician (Pulmonary Disease) Bensimhon, Bevelyn Buckles, MD as Consulting Physician (Cardiology)  Chief Complaint  Patient presents with   Follow-up    No cough, shortness of breath or wheezing.     BACKGROUND/INTERVAL:[name] is an 82 year old lifelong never smoker, albeit with secondhand exposure in the past, and a history as noted below, who presents for follow-up of dyspnea.  Subsequent clinical evaluation was consistent with severe pulmonary hypertension which had previously been noted (cardiology at [facility]) and mitral valve regurgitation.  Patient had developed atrial fibrillation and is likely worsened her volume status issues.  Since that time she has been followed by cardiology with Dr. Mariah Milling and by Dr. Nicholes Mango for management of her pulmonary hypertension.  Her PFTs are a combination of restriction/obstruction she has a significant asthmatic component. She will need repeat PFTs MRI of 2025.  Since her prior visit of 18 September 2022 she has not had any new issues.  HPI Discussed the use of AI scribe software for clinical note transcription with the patient, who gave verbal consent to proceed.  History of Present Illness   The patient, with a history of asthma, pulmonary hypertension, and congestive heart failure (CHF), reports forgetting to take her inhaler daily, despite understanding its importance. She attempts to incorporate it into her bedtime routine. She also uses oxygen at night, which she believes helps with her overall health. She has recently seen Cardiology for her pulmonary hypertension and CHF, and is currently on Farxiga. She denies any leg swelling.  She underwent FeNO today, which showed no evidence of inflammation. Her lung function was at 68%,  improving to 83% after the use of a nebulizer during prior PFTs. This improvement indicates a component of asthma, which is a concern for her overall lung health.   Overall she does not endorse any new issues.  She has been doing well from the respiratory standpoint.  She has not had any fevers, chills or sweats.  No orthopnea or paroxysmal nocturnal dyspnea.  No wheezing, cough no sputum production and no hemoptysis.  She is on appropriate cardiac medications.  She has not required use of albuterol rescue inhaler.  Pliant with her nocturnal oxygen.     DATA 12/14/2020 echocardiogram [facility]): LVEF over 55% VH, severe enlargement of the left and right atria.  Moderate to severe MR/TR, severe pulmonary hypertension. 01/26/2022 chest x-ray PA and lateral: Cardiomegaly, interstitial edema, small bilateral pleural effusions, perihilar bandlike atelectasis. 02/09/2022 echocardiogram: LVEF 60 to 65%, grade II DD, normal RV systolic function RV size normal.  Severely elevated pulmonary artery systolic pressure, RV systolic pressure estimated at 62.8 mm Hg, left atrial size moderately dilated, moderate MR, mild to moderate AR, no AS. 03/28/2022 PFTs: FEV1 1.21 L or 68% predicted FVC 1.35 L or 56% predicted, FEV1/FVC 90%.  There is significant bronchodilator response.  Mild to moderate restriction, moderate diffusion defect.  Combination of obstructive and restrictive physiology, obstructive physiology with bronchodilator response consistent with asthmatic response. 05/02/2022 CT chest high-resolution: No evidence of fibrotic interstitial lung disease, air trapping noted suggestive of small airways disease, few small bilateral pulmonary nodules less than or equal to 4 mm in diameter. 06/19/2022 sleep study: Overall RDI was 4.4 indicating no significant OSA.  Overall AHI 3.7.  SpO2 nadir  was 82%.  Review of Systems A 10 point review of systems was performed and it is as noted above otherwise negative.    Patient Active Problem List   Diagnosis Date Noted   Chronic respiratory failure with hypoxia (HCC) 05/12/2022   Asthma 05/12/2022   Congestive heart failure (HCC) 01/31/2022   Severe pulmonary hypertension (HCC) 01/31/2022   Carotid artery disease (HCC) 12/03/2019   Moderate mitral insufficiency 09/24/2018   Atrial fibrillation (HCC) 08/14/2018   Mood disorder (HCC) 06/21/2017   Preventative health care 05/31/2016   Advance directive discussed with patient 05/31/2016   Aortic insufficiency 06/08/2011   GERD 04/20/2009   RAYNAUD'S SYNDROME, HX OF 04/20/2009   HYPERLIPIDEMIA 12/14/2006   Essential hypertension 12/14/2006   Allergic rhinitis due to pollen 12/14/2006   SYMPTOM, INCONTINENCE, MIXED, URGE/STRESS 12/14/2006    Social History   Tobacco Use   Smoking status: Never    Passive exposure: Past   Smokeless tobacco: Never  Substance Use Topics   Alcohol use: Yes    Comment: special events    Allergies  Allergen Reactions   Amlodipine Besy-Benazepril Hcl Swelling    Lotrel caused swelling of feet & facial swelling unilaterally   Amlodipine Besy-Benazepril Hcl Other (See Comments)   Lotrel [Amlodipine Besy-Benazepril Hcl]    Molds & Smuts Other (See Comments)   Wasp Venom Swelling    Allergic to wasps    Current Meds  Medication Sig   amiodarone (PACERONE) 200 MG tablet Take 0.5 tablets (100 mg total) by mouth every other day.   amLODipine (NORVASC) 5 MG tablet Take 1 tablet (5 mg total) by mouth daily. Take additional tablet for BP greater than 140   apixaban (ELIQUIS) 2.5 MG TABS tablet Take 1 tablet (2.5 mg total) by mouth 2 (two) times daily.   EPINEPHrine 0.3 mg/0.3 mL IJ SOAJ injection Inject 0.3 mg into the muscle as needed for anaphylaxis.   famotidine (PEPCID) 20 MG tablet Take 20 mg by mouth daily.   FARXIGA 10 MG TABS tablet TAKE 1 TABLET BY MOUTH DAILY BEFORE BREAKFAST.   fexofenadine (ALLEGRA) 180 MG tablet Take 180 mg by mouth daily.    fluticasone furoate-vilanterol (BREO ELLIPTA) 100-25 MCG/ACT AEPB Inhale 1 puff into the lungs daily. (Patient taking differently: Inhale 1 puff into the lungs as needed.)   furosemide (LASIX) 20 MG tablet Take 1 tablet (20 mg total) by mouth daily.   glucosamine-chondroitin 500-400 MG tablet Take 1 tablet by mouth 2 (two) times daily. 1 pm   Krill Oil 500 MG CAPS Take by mouth.   mometasone (NASONEX) 50 MCG/ACT nasal spray PLACE 1 SPRAY INTO THE NOSE 2 (TWO) TIMES DAILY.   Multiple Vitamin (MULTIVITAMIN) capsule Take 1 capsule by mouth daily.   OXYGEN Inhale into the lungs. 1 liter at bedtime   potassium chloride SA (KLOR-CON M20) 20 MEQ tablet Take 1 tablet (20 mEq total) by mouth daily.   pravastatin (PRAVACHOL) 40 MG tablet TAKE 1 TABLET BY MOUTH EVERYDAY AT BEDTIME (Patient taking differently: Once a day with Lasix)   prednisoLONE acetate (PRED FORTE) 1 % ophthalmic suspension Apply to eye.   sertraline (ZOLOFT) 25 MG tablet TAKE 1 TABLET BY MOUTH EVERY DAY   sodium chloride (MURO 128) 5 % ophthalmic solution Place 1 drop into the left eye 2 (two) times daily.   valsartan (DIOVAN) 160 MG tablet TAKE 1 TABLET BY MOUTH TWICE A DAY   vitamin B-12 (CYANOCOBALAMIN) 500 MCG tablet Take 500 mcg by mouth daily.  Immunization History  Administered Date(s) Administered   Fluad Quad(high Dose 65+) 12/03/2020, 12/19/2021   Fluad Trivalent(High Dose 65+) 12/25/2022   Influenza Split 01/03/2011, 12/28/2011   Influenza Whole 12/14/2006, 01/07/2008, 12/16/2008, 12/15/2009   Influenza, High Dose Seasonal PF 12/25/2012, 03/15/2021   Influenza,inj,Quad PF,6+ Mos 11/28/2013, 12/04/2014, 12/08/2015, 11/27/2016, 11/30/2017, 12/05/2018, 11/29/2019   PFIZER(Purple Top)SARS-COV-2 Vaccination 03/19/2019, 04/12/2019, 06/24/2019, 12/22/2019   Pneumococcal Conjugate-13 05/28/2015   Pneumococcal Polysaccharide-23 05/01/2012, 03/15/2021   Td 04/20/2009        Objective:    BP (!) 150/60 (BP Location: Left  Arm, Patient Position: Sitting, Cuff Size: Normal)   Pulse 75   Temp 98.1 F (36.7 C) (Temporal)   Ht 5' 2.5" (1.588 m)   Wt 124 lb 6.4 oz (56.4 kg)   SpO2 98%   BMI 22.39 kg/m   SpO2: 98 %  GENERAL: Patient is a well-developed, well-nourished elderly woman in no acute respiratory distress no tachypnea. She is fully ambulatory, no conversational dyspnea. HEAD: Normocephalic, atraumatic.  EYES: Pupils equal, round, reactive to light.  No scleral icterus.  MOUTH: Caps and crowns present.  Oral mucosa moist.  No thrush. NECK: Supple. No thyromegaly. Trachea midline. no JVD.  No adenopathy. PULMONARY: Good air entry bilaterally.  No adventitious sounds. CARDIOVASCULAR: S1 and S2.  Currently in NSR with CVR.  AR murmur 2/6, mitral regurgitation murmur 2/6 (stable).  ABDOMEN: Mildly protuberant otherwise benign. MUSCULOSKELETAL: No joint deformity, no clubbing, no lower extremity edema.  NEUROLOGIC: Grossly nonfocal. SKIN: Intact,warm,dry. PSYCH: Mood and behavior normal.  Lab Results  Component Value Date   NITRICOXIDE 13 12/26/2022     Assessment & Plan:     ICD-10-CM   1. Mild persistent asthma without complication  J45.30 Nitric oxide    Pulmonary Function Test [facility] Only    2. Severe pulmonary hypertension (HCC)  I27.20 Pulmonary Function Test [facility] Only    3. Chronic diastolic congestive heart failure (HCC)  I50.32     4. Nocturnal hypoxemia due to pulmonary hypertension (HCC)  I27.29    G47.36     5. Moderate mitral insufficiency  I34.0      Orders Placed This Encounter  Procedures   Nitric oxide   Pulmonary Function Test [facility] Only    Due the end of Jan.    Standing Status:   Future    Standing Expiration Date:   12/26/2023    Order Specific Question:   Full PFT: includes the following: basic spirometry, spirometry pre & post bronchodilator, diffusion capacity (DLCO), lung volumes    Answer:   Full PFT    Order Specific Question:   This test can only be  performed at    Answer:   [facility]   Discussion:    Asthma Inconsistent use of inhaler. No current evidence of inflammation in airways. -Encouraged daily use of inhaler. -Repeat pulmonary function tests in January 2025.  Pulmonary Hypertension and Congestive Heart Failure Stable with no leg swelling. -Continue nightly oxygen use and Farxiga as prescribed. -Follow-up with Dr. Gala Romney as scheduled.  General Health Maintenance -Received flu shot. -Follow-up appointment in February 2025 after repeat pulmonary function tests.      Gailen Shelter, MD Advanced Bronchoscopy PCCM [facility]    *This note was generated using voice recognition software/Dragon and/or AI transcription program.  Despite best efforts to proofread, errors can occur which can change the meaning. Any transcriptional errors that result from this process are unintentional and may not be fully corrected at  the time of dictation.

## 2022-12-26 NOTE — Patient Instructions (Signed)
VISIT SUMMARY:  During today's visit, we discussed your asthma, pulmonary hypertension, and congestive heart failure. We reviewed your current medications and the importance of consistent inhaler use. We also went over your recent test results and general health maintenance.  YOUR PLAN:  -ASTHMA: Asthma is a condition where your airways narrow and swell, producing extra mucus, which can make breathing difficult. You need to use your inhaler daily to manage your symptoms effectively. We will repeat your pulmonary function tests in January 2025 to monitor your progress.  -PULMONARY HYPERTENSION AND CONGESTIVE HEART FAILURE: Pulmonary hypertension is high blood pressure in the arteries to your lungs, and congestive heart failure is when your heart doesn't pump blood as well as it should. Your condition is stable, and you should continue using oxygen at night and taking [place] as prescribed. Please follow up with Dr. Gala Romney as scheduled.  -GENERAL HEALTH MAINTENANCE: You received your flu shot yesterday. Please continue with your current health regimen and follow up in February 2025 after your repeat pulmonary function tests.  INSTRUCTIONS:  Please remember to use your inhaler daily and continue your nightly oxygen use and Farxiga. Follow up with Dr. Gala Romney as scheduled and return for a follow-up appointment in February 2025 after your repeat pulmonary function tests in January 2025.

## 2022-12-27 ENCOUNTER — Other Ambulatory Visit: Payer: Self-pay | Admitting: Cardiovascular Disease

## 2022-12-27 NOTE — Telephone Encounter (Signed)
Please review

## 2022-12-27 NOTE — Telephone Encounter (Signed)
Prescription refill request for Eliquis received. Indication:AFIB Last office visit:9/24 Scr:0.88  9/24 Age: 82 Weight:56.4  kg  Prescription refilled

## 2022-12-28 ENCOUNTER — Other Ambulatory Visit
Admission: RE | Admit: 2022-12-28 | Discharge: 2022-12-28 | Disposition: A | Discharge status: Home / Self Care | Payer: Medicare Other | Source: Ambulatory Visit | Attending: Internal Medicine | Admitting: Internal Medicine

## 2022-12-28 DIAGNOSIS — I5032 Chronic diastolic (congestive) heart failure: Secondary | ICD-10-CM | POA: Diagnosis not present

## 2022-12-28 LAB — T4, FREE: Free T4: 1.14 ng/dL — ABNORMAL HIGH (ref 0.61–1.12)

## 2022-12-28 LAB — TSH: TSH: 4.358 u[IU]/mL (ref 0.350–4.500)

## 2022-12-29 LAB — T3, FREE: T3, Free: 2.4 pg/mL (ref 2.0–4.4)

## 2022-12-30 ENCOUNTER — Other Ambulatory Visit: Payer: Self-pay | Admitting: Internal Medicine

## 2023-01-16 DIAGNOSIS — J3081 Allergic rhinitis due to animal (cat) (dog) hair and dander: Secondary | ICD-10-CM | POA: Diagnosis not present

## 2023-01-16 DIAGNOSIS — J301 Allergic rhinitis due to pollen: Secondary | ICD-10-CM | POA: Diagnosis not present

## 2023-01-16 DIAGNOSIS — J3089 Other allergic rhinitis: Secondary | ICD-10-CM | POA: Diagnosis not present

## 2023-02-03 ENCOUNTER — Other Ambulatory Visit: Payer: Self-pay | Admitting: Cardiovascular Disease

## 2023-02-05 NOTE — Telephone Encounter (Signed)
last visit: 10/16/22 with plan to f/u 6 months   Next visit:  none/active recall

## 2023-02-06 DIAGNOSIS — J301 Allergic rhinitis due to pollen: Secondary | ICD-10-CM | POA: Diagnosis not present

## 2023-02-06 DIAGNOSIS — J3089 Other allergic rhinitis: Secondary | ICD-10-CM | POA: Diagnosis not present

## 2023-02-06 DIAGNOSIS — J3081 Allergic rhinitis due to animal (cat) (dog) hair and dander: Secondary | ICD-10-CM | POA: Diagnosis not present

## 2023-02-15 ENCOUNTER — Other Ambulatory Visit: Payer: Self-pay | Admitting: Internal Medicine

## 2023-02-15 ENCOUNTER — Other Ambulatory Visit: Payer: Self-pay | Admitting: Cardiovascular Disease

## 2023-02-23 DIAGNOSIS — J301 Allergic rhinitis due to pollen: Secondary | ICD-10-CM | POA: Diagnosis not present

## 2023-02-23 DIAGNOSIS — J3081 Allergic rhinitis due to animal (cat) (dog) hair and dander: Secondary | ICD-10-CM | POA: Diagnosis not present

## 2023-02-23 DIAGNOSIS — J3089 Other allergic rhinitis: Secondary | ICD-10-CM | POA: Diagnosis not present

## 2023-03-06 DIAGNOSIS — Z01411 Encounter for gynecological examination (general) (routine) with abnormal findings: Secondary | ICD-10-CM | POA: Diagnosis not present

## 2023-03-06 DIAGNOSIS — Z130 Encounter for screening for diseases of the blood and blood-forming organs and certain disorders involving the immune mechanism: Secondary | ICD-10-CM | POA: Diagnosis not present

## 2023-03-06 DIAGNOSIS — N952 Postmenopausal atrophic vaginitis: Secondary | ICD-10-CM | POA: Diagnosis not present

## 2023-03-06 DIAGNOSIS — Z1231 Encounter for screening mammogram for malignant neoplasm of breast: Secondary | ICD-10-CM | POA: Diagnosis not present

## 2023-03-06 DIAGNOSIS — N951 Menopausal and female climacteric states: Secondary | ICD-10-CM | POA: Diagnosis not present

## 2023-03-06 DIAGNOSIS — Z01419 Encounter for gynecological examination (general) (routine) without abnormal findings: Secondary | ICD-10-CM | POA: Diagnosis not present

## 2023-03-09 DIAGNOSIS — H903 Sensorineural hearing loss, bilateral: Secondary | ICD-10-CM | POA: Diagnosis not present

## 2023-03-13 ENCOUNTER — Other Ambulatory Visit: Payer: Self-pay

## 2023-03-13 MED ORDER — FLUTICASONE FUROATE-VILANTEROL 100-25 MCG/ACT IN AEPB: 1.0000 | INHALATION_SPRAY | RESPIRATORY_TRACT | 5 refills | Status: DC | PRN

## 2023-03-15 DIAGNOSIS — J3089 Other allergic rhinitis: Secondary | ICD-10-CM | POA: Diagnosis not present

## 2023-03-15 DIAGNOSIS — J301 Allergic rhinitis due to pollen: Secondary | ICD-10-CM | POA: Diagnosis not present

## 2023-03-15 DIAGNOSIS — J3081 Allergic rhinitis due to animal (cat) (dog) hair and dander: Secondary | ICD-10-CM | POA: Diagnosis not present

## 2023-03-15 NOTE — Telephone Encounter (Signed)
 Error

## 2023-03-16 ENCOUNTER — Other Ambulatory Visit: Payer: Self-pay | Admitting: Pulmonary Disease

## 2023-03-16 DIAGNOSIS — J4540 Moderate persistent asthma, uncomplicated: Secondary | ICD-10-CM

## 2023-03-16 MED ORDER — FLUTICASONE-SALMETEROL 250-50 MCG/ACT IN AEPB: 1.0000 | INHALATION_SPRAY | Freq: Two times a day (BID) | RESPIRATORY_TRACT | 11 refills | Status: DC

## 2023-03-16 NOTE — Progress Notes (Signed)
Patient's insurance no longer covers Breo.  Alternative his generic Advair 250/50 1 inhalation twice a day, prescription sent to the pharmacy.  Gailen Shelter, MD Advanced Bronchoscopy PCCM  [facility]

## 2023-04-04 ENCOUNTER — Ambulatory Visit: Payer: Medicare Other | Admitting: Dermatology

## 2023-04-05 DIAGNOSIS — J301 Allergic rhinitis due to pollen: Secondary | ICD-10-CM | POA: Diagnosis not present

## 2023-04-05 DIAGNOSIS — J3089 Other allergic rhinitis: Secondary | ICD-10-CM | POA: Diagnosis not present

## 2023-04-05 DIAGNOSIS — J3081 Allergic rhinitis due to animal (cat) (dog) hair and dander: Secondary | ICD-10-CM | POA: Diagnosis not present

## 2023-04-09 ENCOUNTER — Ambulatory Visit: Payer: Medicare Other | Attending: Pulmonary Disease

## 2023-04-09 DIAGNOSIS — Z79899 Other long term (current) drug therapy: Secondary | ICD-10-CM | POA: Insufficient documentation

## 2023-04-09 DIAGNOSIS — J4530 Mild persistent asthma, uncomplicated: Secondary | ICD-10-CM | POA: Diagnosis present

## 2023-04-09 DIAGNOSIS — I272 Pulmonary hypertension, unspecified: Secondary | ICD-10-CM | POA: Diagnosis not present

## 2023-04-09 LAB — PULMONARY FUNCTION TEST ARMC ONLY
DL/VA % pred: 83 %
DL/VA: 3.41 ml/min/mmHg/L
DLCO unc % pred: 55 %
DLCO unc: 10.06 ml/min/mmHg
FEF 25-75 Post: 1.41 L/s
FEF 25-75 Pre: 2.11 L/s
FEF2575-%Change-Post: -33 %
FEF2575-%Pred-Post: 117 %
FEF2575-%Pred-Pre: 175 %
FEV1-%Change-Post: -4 %
FEV1-%Pred-Post: 83 %
FEV1-%Pred-Pre: 87 %
FEV1-Post: 1.46 L
FEV1-Pre: 1.52 L
FEV1FVC-%Change-Post: -3 %
FEV1FVC-%Pred-Pre: 121 %
FEV6-%Change-Post: 0 %
FEV6-%Pred-Post: 76 %
FEV6-%Pred-Pre: 76 %
FEV6-Post: 1.7 L
FEV6-Pre: 1.69 L
FEV6FVC-%Pred-Post: 106 %
FEV6FVC-%Pred-Pre: 106 %
FVC-%Change-Post: 0 %
FVC-%Pred-Post: 72 %
FVC-%Pred-Pre: 72 %
FVC-Post: 1.7 L
FVC-Pre: 1.71 L
Post FEV1/FVC ratio: 86 %
Post FEV6/FVC ratio: 100 %
Pre FEV1/FVC ratio: 89 %
Pre FEV6/FVC Ratio: 100 %
RV % pred: 31 %
RV: 0.75 L
TLC % pred: 63 %
TLC: 3.13 L

## 2023-04-09 MED ORDER — ALBUTEROL SULFATE (2.5 MG/3ML) 0.083% IN NEBU
2.5000 mg | INHALATION_SOLUTION | Freq: Once | RESPIRATORY_TRACT | Status: AC
  Administered 2023-04-09: 2.5 mg via RESPIRATORY_TRACT
  Filled 2023-04-09: qty 3

## 2023-04-10 ENCOUNTER — Ambulatory Visit: Payer: Medicare Other | Admitting: Pulmonary Disease

## 2023-04-12 DIAGNOSIS — J301 Allergic rhinitis due to pollen: Secondary | ICD-10-CM | POA: Diagnosis not present

## 2023-04-12 DIAGNOSIS — J3089 Other allergic rhinitis: Secondary | ICD-10-CM | POA: Diagnosis not present

## 2023-04-12 DIAGNOSIS — J3081 Allergic rhinitis due to animal (cat) (dog) hair and dander: Secondary | ICD-10-CM | POA: Diagnosis not present

## 2023-04-15 ENCOUNTER — Other Ambulatory Visit: Payer: Self-pay | Admitting: Pulmonary Disease

## 2023-04-17 NOTE — Telephone Encounter (Signed)
 [name]

## 2023-04-19 ENCOUNTER — Ambulatory Visit: Payer: Medicare Other | Admitting: Pulmonary Disease

## 2023-04-20 DIAGNOSIS — H26492 Other secondary cataract, left eye: Secondary | ICD-10-CM | POA: Diagnosis not present

## 2023-04-20 DIAGNOSIS — H35371 Puckering of macula, right eye: Secondary | ICD-10-CM | POA: Diagnosis not present

## 2023-04-20 DIAGNOSIS — H353131 Nonexudative age-related macular degeneration, bilateral, early dry stage: Secondary | ICD-10-CM | POA: Diagnosis not present

## 2023-04-26 ENCOUNTER — Ambulatory Visit (INDEPENDENT_AMBULATORY_CARE_PROVIDER_SITE_OTHER): Payer: Medicare Other | Admitting: Pulmonary Disease

## 2023-04-26 ENCOUNTER — Encounter: Payer: Self-pay | Admitting: Pulmonary Disease

## 2023-04-26 VITALS — BP 142/70 | HR 72 | Temp 97.1°F | Ht 62.5 in | Wt 124.4 lb

## 2023-04-26 DIAGNOSIS — I5032 Chronic diastolic (congestive) heart failure: Secondary | ICD-10-CM | POA: Diagnosis not present

## 2023-04-26 DIAGNOSIS — I272 Pulmonary hypertension, unspecified: Secondary | ICD-10-CM

## 2023-04-26 DIAGNOSIS — J4530 Mild persistent asthma, uncomplicated: Secondary | ICD-10-CM

## 2023-04-26 DIAGNOSIS — G4736 Sleep related hypoventilation in conditions classified elsewhere: Secondary | ICD-10-CM

## 2023-04-26 DIAGNOSIS — I480 Paroxysmal atrial fibrillation: Secondary | ICD-10-CM | POA: Diagnosis not present

## 2023-04-26 DIAGNOSIS — I340 Nonrheumatic mitral (valve) insufficiency: Secondary | ICD-10-CM | POA: Diagnosis not present

## 2023-04-26 NOTE — Progress Notes (Signed)
 Subjective:    Patient ID: [name], female    DOB: [birth year], 83 y.o.   MRN: [id number]  Patient Care Team: Karie Schwalbe, MD as PCP - General (Internal Medicine) Salena Saner, MD as Consulting Physician (Pulmonary Disease) Bensimhon, Bevelyn Buckles, MD as Consulting Physician (Cardiology)  Chief Complaint  Patient presents with   Follow-up    No SOB, wheezing or cough. Stilling has drainage.     BACKGROUND/INTERVAL:[name] is an 83 year old lifelong never smoker, albeit with secondhand exposure in the past, and a history as noted below, who presents for follow-up of dyspnea.  Subsequent clinical evaluation was consistent with severe pulmonary hypertension which had previously been noted (cardiology at [facility]) and mitral valve regurgitation.  Patient had developed atrial fibrillation and this likely worsened her volume status issues.  Since that time she has been followed by cardiology with Dr. Mariah Milling and by Dr. Nicholes Mango for management of her pulmonary hypertension and congestive heart failure.  Her PFTs are a combination of restriction/obstruction she has a significant asthmatic component.  She had PFTs 09 April 2023.  Since her prior visit of 26 December 2022 she has not had any new issues.   HPI Discussed the use of AI scribe software for clinical note transcription with the patient, who gave verbal consent to proceed.  History of Present Illness   [name] is an 83 year old female with severe pulmonary hypertension, mild asthma, and nocturnal hypoxemia who presents for follow-up.  She has severe pulmonary hypertension related to mitral disease and diastolic dysfunction. Her condition has shown significant improvement, and she is scheduled for reevaluation with 2D echo in March. She has been on nocturnal oxygen therapy for about a year to manage nocturnal hypoxemia related to pulmonary hypertension.  She is compliant with the therapy.  She inquired  about discontinuing oxygen therapy, but it was explained that the oxygen is necessary due to her pulmonary hypertension issues and significant hypoxemia during sleep.  Regarding her asthma management, she had to switch from [place] due to insurance issues and high costs. She was prescribed Advair 250/50 twice a day, which she finds acceptable as long as it is more affordable. Pulmonary function tests indicate that her mild asthma is stable.     DATA 12/14/2020 echocardiogram [facility]): LVEF over 55% VH, severe enlargement of the left and right atria.  Moderate to severe MR/TR, severe pulmonary hypertension. 01/26/2022 chest x-ray PA and lateral: Cardiomegaly, interstitial edema, small bilateral pleural effusions, perihilar bandlike atelectasis. 02/09/2022 echocardiogram: LVEF 60 to 65%, grade II DD, normal RV systolic function RV size normal.  Severely elevated pulmonary artery systolic pressure, RV systolic pressure estimated at 62.8 mm Hg, left atrial size moderately dilated, moderate MR, mild to moderate AR, no AS. 03/28/2022 PFTs: FEV1 1.21 L or 68% predicted FVC 1.35 L or 56% predicted, FEV1/FVC 90%.  There is significant bronchodilator response.  Mild to moderate restriction, moderate diffusion defect.  Combination of obstructive and restrictive physiology, obstructive physiology with bronchodilator response consistent with asthmatic response. 05/02/2022 CT chest high-resolution: No evidence of fibrotic interstitial lung disease, air trapping noted suggestive of small airways disease, few small bilateral pulmonary nodules less than or equal to 4 mm in diameter. 06/19/2022 sleep study: Overall RDI was 4.4 indicating no significant OSA.  Overall AHI 3.7.  SpO2 nadir was 82%.  Continued use of nocturnal oxygen was recommended. 04/09/2023 PFTs: FEV1 1.52 L or 87% predicted, FVC 1.71 L or 72% predicted, no bronchodilator response.  Lung volumes moderately reduced.  Diffusion capacity moderately reduced however  corrects to near normal by alveolar volume.    Review of Systems A 10 point review of systems was performed and it is as noted above otherwise negative.   Patient Active Problem List   Diagnosis Date Noted   Chronic respiratory failure with hypoxia (HCC) 05/12/2022   Asthma 05/12/2022   Congestive heart failure (HCC) 01/31/2022   Severe pulmonary hypertension (HCC) 01/31/2022   Carotid artery disease (HCC) 12/03/2019   Moderate mitral insufficiency 09/24/2018   Atrial fibrillation (HCC) 08/14/2018   Mood disorder (HCC) 06/21/2017   Preventative health care 05/31/2016   Advance directive discussed with patient 05/31/2016   Aortic insufficiency 06/08/2011   GERD 04/20/2009   RAYNAUD'S SYNDROME, HX OF 04/20/2009   HYPERLIPIDEMIA 12/14/2006   Essential hypertension 12/14/2006   Allergic rhinitis due to pollen 12/14/2006   SYMPTOM, INCONTINENCE, MIXED, URGE/STRESS 12/14/2006    Social History   Tobacco Use   Smoking status: Never    Passive exposure: Past   Smokeless tobacco: Never  Substance Use Topics   Alcohol use: Yes    Comment: special events    Allergies  Allergen Reactions   Amlodipine Besy-Benazepril Hcl Swelling    Lotrel caused swelling of feet & facial swelling unilaterally   Amlodipine Besy-Benazepril Hcl Other (See Comments)   Lotrel [Amlodipine Besy-Benazepril Hcl]    Molds & Smuts Other (See Comments)   Wasp Venom Swelling    Allergic to wasps    Current Meds  Medication Sig   amiodarone (PACERONE) 200 MG tablet Take 0.5 tablets (100 mg total) by mouth every other day.   amLODipine (NORVASC) 5 MG tablet TAKE 1 TABLET (5 MG TOTAL) BY MOUTH DAILY.   ELIQUIS 2.5 MG TABS tablet TAKE 1 TABLET BY MOUTH TWICE A DAY   EPINEPHrine 0.3 mg/0.3 mL IJ SOAJ injection Inject 0.3 mg into the muscle as needed for anaphylaxis.   famotidine (PEPCID) 20 MG tablet Take 20 mg by mouth daily.   FARXIGA 10 MG TABS tablet TAKE 1 TABLET BY MOUTH DAILY BEFORE BREAKFAST.    fexofenadine (ALLEGRA) 180 MG tablet Take 180 mg by mouth daily.   fluticasone-salmeterol (WIXELA INHUB) 250-50 MCG/ACT AEPB Inhale 1 puff into the lungs in the morning and at bedtime.   furosemide (LASIX) 20 MG tablet Take 1 tablet (20 mg total) by mouth daily.   glucosamine-chondroitin 500-400 MG tablet Take 1 tablet by mouth 2 (two) times daily. 1 pm   Krill Oil 500 MG CAPS Take by mouth.   mometasone (NASONEX) 50 MCG/ACT nasal spray PLACE 1 SPRAY INTO THE NOSE 2 (TWO) TIMES DAILY.   Multiple Vitamin (MULTIVITAMIN) capsule Take 1 capsule by mouth daily.   OXYGEN Inhale into the lungs. 1 liter at bedtime   potassium chloride SA (KLOR-CON M20) 20 MEQ tablet Take 1 tablet (20 mEq total) by mouth daily.   pravastatin (PRAVACHOL) 40 MG tablet TAKE 1 TABLET BY MOUTH EVERYDAY AT BEDTIME   prednisoLONE acetate (PRED FORTE) 1 % ophthalmic suspension Apply to eye.   sertraline (ZOLOFT) 25 MG tablet TAKE 1 TABLET BY MOUTH EVERY DAY   sodium chloride (MURO 128) 5 % ophthalmic solution Place 1 drop into the left eye 2 (two) times daily.   valsartan (DIOVAN) 160 MG tablet TAKE 1 TABLET BY MOUTH TWICE A DAY   vitamin B-12 (CYANOCOBALAMIN) 500 MCG tablet Take 500 mcg by mouth daily.    Immunization History  Administered Date(s) Administered  Fluad Quad(high Dose 65+) 12/03/2020, 12/19/2021   Fluad Trivalent(High Dose 65+) 12/25/2022   Influenza Split 01/03/2011, 12/28/2011   Influenza Whole 12/14/2006, 01/07/2008, 12/16/2008, 12/15/2009   Influenza, High Dose Seasonal PF 12/25/2012, 03/15/2021   Influenza,inj,Quad PF,6+ Mos 11/28/2013, 12/04/2014, 12/08/2015, 11/27/2016, 11/30/2017, 12/05/2018, 11/29/2019   PFIZER(Purple Top)SARS-COV-2 Vaccination 03/19/2019, 04/12/2019, 06/24/2019, 12/22/2019   Pneumococcal Conjugate-13 05/28/2015   Pneumococcal Polysaccharide-23 05/01/2012, 03/15/2021   Td 04/20/2009        Objective:     BP (!) 142/70 (BP Location: Right Arm, Cuff Size: Normal)   Pulse 72    Temp (!) 97.1 F (36.2 C)   Ht 5' 2.5" (1.588 m)   Wt 124 lb 6.4 oz (56.4 kg)   SpO2 98%   BMI 22.39 kg/m   SpO2: 98 % O2 Device: None (Room air)  GENERAL: Patient is a well-developed, well-nourished elderly woman in no acute respiratory distress no tachypnea. She is fully ambulatory, no conversational dyspnea. HEAD: Normocephalic, atraumatic.  EYES: Pupils equal, round, reactive to light.  No scleral icterus.  MOUTH: Caps and crowns present.  Oral mucosa moist.  No thrush. NECK: Supple. No thyromegaly. Trachea midline. no JVD.  No adenopathy. PULMONARY: Good air entry bilaterally.  No adventitious sounds. CARDIOVASCULAR: S1 and S2.  Currently in NSR with CVR.  Aortic regurg murmur 2/6, mitral regurgitation murmur 2/6 (stable).  ABDOMEN: Mildly protuberant otherwise benign. MUSCULOSKELETAL: No joint deformity, no clubbing, no lower extremity edema.  NEUROLOGIC: Grossly nonfocal. SKIN: Intact,warm,dry. PSYCH: Mood and behavior normal.    Assessment & Plan:     ICD-10-CM   1. Mild persistent asthma without complication  J45.30     2. Severe pulmonary hypertension (HCC)  I27.20     3. Chronic diastolic congestive heart failure (HCC)  I50.32     4. Nocturnal hypoxemia due to pulmonary hypertension (HCC)  I27.29    G47.36     5. Moderate mitral insufficiency  I34.0     6. PAF (paroxysmal atrial fibrillation) (HCC)  I48.0      Discussion:    Pulmonary Hypertension Severe pulmonary hypertension, likely secondary to valvular heart disease/diastolic dysfunction. Currently on oxygen therapy to manage this condition and prevent nocturnal hypoxemia. Reports improvement in symptoms indicating effective treatment. - Continue oxygen therapy nocturnally, patient compliant and notes benefit of therapy - Follow-up with Dr. Gala Romney for further continued management of chronic diastolic heart failure/mitral valve monitoring  Nocturnal Hypoxemia Nocturnal hypoxemia secondary to  pulmonary hypertension. Oxygen therapy is used to maintain adequate oxygen levels during sleep and reduce cardiac stress. - Continue oxygen therapy during sleep - Compliant with therapy, notes benefit  Mild Asthma Mild asthma, well-controlled with current medication. Switched from [place] to Advair twice-daily due to insurance issues. Patient agreed to the switch for affordability. - Continue current asthma medication regimen: Advair 250/51 puff twice a day, she declines albuterol - Monitor for any changes in symptoms or medication efficacy.     Advised if symptoms do not improve or worsen, to please contact office for sooner follow up or seek emergency care.    I spent 31 minutes of dedicated to the care of this patient on the date of this encounter to include pre-visit review of records, face-to-face time with the patient discussing conditions above, post visit ordering of testing, clinical documentation with the electronic health record, making appropriate referrals as documented, and communicating necessary findings to members of the patients care team.     C. Danice Goltz, MD Advanced Bronchoscopy PCCM   [facility]    *This note was generated using voice recognition software/Dragon and/or AI transcription program.  Despite best efforts to proofread, errors can occur which can change the meaning. Any transcriptional errors that result from this process are unintentional and may not be fully corrected at the time of dictation.

## 2023-04-26 NOTE — Patient Instructions (Addendum)
 VISIT SUMMARY:  [name], an 83 year old female with severe pulmonary hypertension, mild asthma, and nocturnal hypoxemia, came in for a follow-up visit. Her pulmonary hypertension, related to a heart valve problem, has shown significant improvement. She has been on oxygen therapy for about a year to manage nocturnal hypoxemia. She inquired about discontinuing oxygen therapy but was advised to continue it. Her mild asthma is stable with a new, more affordable medication.  YOUR PLAN:  -PULMONARY HYPERTENSION: Pulmonary hypertension is high blood pressure in the arteries that supply the lungs, often due to heart valve problems. You should continue oxygen therapy to manage this condition and prevent nocturnal hypoxemia. Follow up with Dr. Gala Romney for further evaluation and management.  -NOCTURNAL HYPOXEMIA: Nocturnal hypoxemia is low oxygen levels during sleep, often due to pulmonary hypertension. Continue using oxygen therapy during sleep to maintain adequate oxygen levels and reduce stress on your heart.  -MILD ASTHMA: Asthma is a condition where your airways narrow and swell, making it hard to breathe. Your mild asthma is well-controlled with your current medication, Advair. Continue taking your asthma medication as prescribed and monitor for any changes in symptoms or effectiveness.  INSTRUCTIONS:  Please continue your oxygen therapy as advised and follow up with Dr. Gala Romney for further evaluation and management of your pulmonary hypertension and mitral insufficiency. Keep taking your asthma medication as prescribed and monitor for any changes in symptoms.

## 2023-04-30 ENCOUNTER — Encounter: Payer: Self-pay | Admitting: Pulmonary Disease

## 2023-05-01 DIAGNOSIS — H1045 Other chronic allergic conjunctivitis: Secondary | ICD-10-CM | POA: Diagnosis not present

## 2023-05-01 DIAGNOSIS — J3089 Other allergic rhinitis: Secondary | ICD-10-CM | POA: Diagnosis not present

## 2023-05-01 DIAGNOSIS — J4530 Mild persistent asthma, uncomplicated: Secondary | ICD-10-CM | POA: Diagnosis not present

## 2023-05-01 DIAGNOSIS — J3081 Allergic rhinitis due to animal (cat) (dog) hair and dander: Secondary | ICD-10-CM | POA: Diagnosis not present

## 2023-05-08 ENCOUNTER — Ambulatory Visit (INDEPENDENT_AMBULATORY_CARE_PROVIDER_SITE_OTHER): Payer: Medicare Other | Admitting: Dermatology

## 2023-05-08 ENCOUNTER — Encounter: Payer: Self-pay | Admitting: Dermatology

## 2023-05-08 DIAGNOSIS — L820 Inflamed seborrheic keratosis: Secondary | ICD-10-CM

## 2023-05-08 DIAGNOSIS — J3089 Other allergic rhinitis: Secondary | ICD-10-CM | POA: Diagnosis not present

## 2023-05-08 DIAGNOSIS — J301 Allergic rhinitis due to pollen: Secondary | ICD-10-CM | POA: Diagnosis not present

## 2023-05-08 DIAGNOSIS — L821 Other seborrheic keratosis: Secondary | ICD-10-CM

## 2023-05-08 DIAGNOSIS — Z1283 Encounter for screening for malignant neoplasm of skin: Secondary | ICD-10-CM | POA: Diagnosis not present

## 2023-05-08 DIAGNOSIS — D1801 Hemangioma of skin and subcutaneous tissue: Secondary | ICD-10-CM

## 2023-05-08 DIAGNOSIS — L814 Other melanin hyperpigmentation: Secondary | ICD-10-CM

## 2023-05-08 DIAGNOSIS — L578 Other skin changes due to chronic exposure to nonionizing radiation: Secondary | ICD-10-CM | POA: Diagnosis not present

## 2023-05-08 DIAGNOSIS — Z85828 Personal history of other malignant neoplasm of skin: Secondary | ICD-10-CM | POA: Diagnosis not present

## 2023-05-08 DIAGNOSIS — D229 Melanocytic nevi, unspecified: Secondary | ICD-10-CM

## 2023-05-08 DIAGNOSIS — W908XXA Exposure to other nonionizing radiation, initial encounter: Secondary | ICD-10-CM | POA: Diagnosis not present

## 2023-05-08 DIAGNOSIS — J3081 Allergic rhinitis due to animal (cat) (dog) hair and dander: Secondary | ICD-10-CM | POA: Diagnosis not present

## 2023-05-08 NOTE — Patient Instructions (Signed)

## 2023-05-08 NOTE — Progress Notes (Signed)
 Follow-Up Visit   Subjective  [name] is a 83 y.o. female who presents for the following: Skin Cancer Screening and Full Body Skin Exam  The patient presents for Total-Body Skin Exam (TBSE) for skin cancer screening and mole check. The patient has spots, moles and lesions to be evaluated, some may be new or changing and the patient may have concern these could be cancer.  Hx SCC. Patient with a few spots at hands, under nose and at hairline.  The following portions of the chart were reviewed this encounter and updated as appropriate: medications, allergies, medical history  Review of Systems:  No other skin or systemic complaints except as noted in HPI or Assessment and Plan.  Objective  Well appearing patient in no apparent distress; mood and affect are within normal limits.  A full examination was performed including scalp, head, eyes, ears, nose, lips, neck, chest, axillae, abdomen, back, buttocks, bilateral upper extremities, bilateral lower extremities, hands, feet, fingers, toes, fingernails, and toenails. All findings within normal limits unless otherwise noted below.   Relevant physical exam findings are noted in the Assessment and Plan.  L dorsal hand x 3 (3) Erythematous stuck-on, waxy papule or plaque Left Upper Cutaneous Lip Scattered tan macules.   Assessment & Plan   SKIN CANCER SCREENING PERFORMED TODAY.  ACTINIC DAMAGE - Chronic condition, secondary to cumulative UV/sun exposure - diffuse scaly erythematous macules with underlying dyspigmentation - Recommend daily broad spectrum sunscreen SPF 30+ to sun-exposed areas, reapply every 2 hours as needed.  - Staying in the shade or wearing long sleeves, sun glasses (UVA+UVB protection) and wide brim hats (4-inch brim around the entire circumference of the hat) are also recommended for sun protection.  - Call for new or changing lesions.  LENTIGINES, SEBORRHEIC KERATOSES, HEMANGIOMAS - Benign normal skin  lesions - Benign-appearing - Call for any changes  MELANOCYTIC NEVI - Tan-brown and/or pink-flesh-colored symmetric macules and papules - Benign appearing on exam today - Observation - Call clinic for new or changing moles - Recommend daily use of broad spectrum spf 30+ sunscreen to sun-exposed areas.   HISTORY OF SQUAMOUS CELL CARCINOMA OF THE SKIN - left pretibia, [facility] 07/2020 - No evidence of recurrence today - No lymphadenopathy - Recommend regular full body skin exams - Recommend daily broad spectrum sunscreen SPF 30+ to sun-exposed areas, reapply every 2 hours as needed.  - Call if any new or changing lesions are noted between office visits   INFLAMED SEBORRHEIC KERATOSIS (3) L dorsal hand x 3 (3) Symptomatic, irritating, patient would like treated.  Benign-appearing.  Call clinic for new or changing lesions.   Destruction of lesion - L dorsal hand x 3 (3) Complexity: simple   Destruction method: cryotherapy   Informed consent: discussed and consent obtained   Timeout:  patient name, date of birth, surgical site, and procedure verified Lesion destroyed using liquid nitrogen: Yes   Region frozen until ice ball extended beyond lesion: Yes   Cryo cycles: 1 or 2. Outcome: patient tolerated procedure well with no complications   Post-procedure details: wound care instructions given   LENTIGINES Left Upper Cutaneous Lip Symptomatic, irritating, patient would like treated.  Benign-appearing.  Call clinic for new or changing lesions.   Destruction of lesion - Left Upper Cutaneous Lip Complexity: simple   Destruction method: cryotherapy   Informed consent: discussed and consent obtained   Timeout:  patient name, date of birth, surgical site, and procedure verified Lesion destroyed using liquid nitrogen: Yes  Region frozen until ice ball extended beyond lesion: Yes   Cryo cycles: 1 or 2. Outcome: patient tolerated procedure well with no complications   Post-procedure  details: wound care instructions given   SEBORRHEIC KERATOSES   MULTIPLE BENIGN NEVI   ACTINIC ELASTOSIS   Return in about 1 year (around 05/07/2024) for with Dr. Kirtland Bouchard, [facility]SE.  Anise Salvo, RMA, am acting as scribe for Elie Goody, MD .   Documentation: I have reviewed the above documentation for accuracy and completeness, and I agree with the above.  Elie Goody, MD

## 2023-05-10 ENCOUNTER — Other Ambulatory Visit: Payer: Self-pay | Admitting: Cardiovascular Disease

## 2023-05-15 DIAGNOSIS — J3089 Other allergic rhinitis: Secondary | ICD-10-CM | POA: Diagnosis not present

## 2023-05-15 DIAGNOSIS — J301 Allergic rhinitis due to pollen: Secondary | ICD-10-CM | POA: Diagnosis not present

## 2023-05-15 DIAGNOSIS — J3081 Allergic rhinitis due to animal (cat) (dog) hair and dander: Secondary | ICD-10-CM | POA: Diagnosis not present

## 2023-05-22 ENCOUNTER — Ambulatory Visit
Admission: RE | Admit: 2023-05-22 | Discharge: 2023-05-22 | Disposition: A | Discharge status: Home / Self Care | Payer: Medicare Other | Source: Ambulatory Visit | Attending: Internal Medicine | Admitting: Internal Medicine

## 2023-05-22 DIAGNOSIS — I080 Rheumatic disorders of both mitral and aortic valves: Secondary | ICD-10-CM | POA: Insufficient documentation

## 2023-05-22 DIAGNOSIS — I2510 Atherosclerotic heart disease of native coronary artery without angina pectoris: Secondary | ICD-10-CM | POA: Diagnosis not present

## 2023-05-22 DIAGNOSIS — E785 Hyperlipidemia, unspecified: Secondary | ICD-10-CM | POA: Insufficient documentation

## 2023-05-22 DIAGNOSIS — R0602 Shortness of breath: Secondary | ICD-10-CM | POA: Insufficient documentation

## 2023-05-22 DIAGNOSIS — R011 Cardiac murmur, unspecified: Secondary | ICD-10-CM | POA: Diagnosis not present

## 2023-05-22 DIAGNOSIS — I509 Heart failure, unspecified: Secondary | ICD-10-CM | POA: Diagnosis present

## 2023-05-22 DIAGNOSIS — I4891 Unspecified atrial fibrillation: Secondary | ICD-10-CM | POA: Insufficient documentation

## 2023-05-22 DIAGNOSIS — I272 Pulmonary hypertension, unspecified: Secondary | ICD-10-CM | POA: Insufficient documentation

## 2023-05-22 DIAGNOSIS — I5032 Chronic diastolic (congestive) heart failure: Secondary | ICD-10-CM | POA: Insufficient documentation

## 2023-05-22 LAB — ECHOCARDIOGRAM COMPLETE
AR max vel: 2.17 cm2
AV Area VTI: 2.3 cm2
AV Area mean vel: 2.53 cm2
AV Mean grad: 6 mmHg
AV Peak grad: 14.4 mmHg
Ao pk vel: 1.9 m/s
Area-P 1/2: 3.6 cm2
Calc EF: 66.1 %
MV M vel: 4.28 m/s
MV Peak grad: 73.3 mmHg
MV VTI: 2.5 cm2
P 1/2 time: 460 ms
Radius: 0.6 cm
S' Lateral: 3.1 cm
Single Plane A2C EF: 68.3 %
Single Plane A4C EF: 67.7 %

## 2023-05-22 NOTE — Progress Notes (Signed)
*  PRELIMINARY RESULTS* Echocardiogram 2D Echocardiogram has been performed.  Carolyne Fiscal 05/22/2023, 10:04 AM

## 2023-05-24 ENCOUNTER — Telehealth: Payer: Self-pay | Admitting: Internal Medicine

## 2023-06-05 DIAGNOSIS — J3089 Other allergic rhinitis: Secondary | ICD-10-CM | POA: Diagnosis not present

## 2023-06-07 ENCOUNTER — Ambulatory Visit: Attending: Internal Medicine | Admitting: Internal Medicine

## 2023-06-07 ENCOUNTER — Encounter: Payer: Self-pay | Admitting: Internal Medicine

## 2023-06-07 VITALS — BP 148/54 | HR 76 | Wt 125.0 lb

## 2023-06-07 DIAGNOSIS — I739 Peripheral vascular disease, unspecified: Secondary | ICD-10-CM | POA: Insufficient documentation

## 2023-06-07 DIAGNOSIS — Z7901 Long term (current) use of anticoagulants: Secondary | ICD-10-CM | POA: Diagnosis not present

## 2023-06-07 DIAGNOSIS — I110 Hypertensive heart disease with heart failure: Secondary | ICD-10-CM | POA: Diagnosis not present

## 2023-06-07 DIAGNOSIS — I080 Rheumatic disorders of both mitral and aortic valves: Secondary | ICD-10-CM | POA: Diagnosis not present

## 2023-06-07 DIAGNOSIS — I5032 Chronic diastolic (congestive) heart failure: Secondary | ICD-10-CM | POA: Insufficient documentation

## 2023-06-07 DIAGNOSIS — I340 Nonrheumatic mitral (valve) insufficiency: Secondary | ICD-10-CM | POA: Diagnosis not present

## 2023-06-07 DIAGNOSIS — I10 Essential (primary) hypertension: Secondary | ICD-10-CM | POA: Diagnosis not present

## 2023-06-07 DIAGNOSIS — R0902 Hypoxemia: Secondary | ICD-10-CM | POA: Diagnosis not present

## 2023-06-07 DIAGNOSIS — I480 Paroxysmal atrial fibrillation: Secondary | ICD-10-CM | POA: Insufficient documentation

## 2023-06-07 DIAGNOSIS — I272 Pulmonary hypertension, unspecified: Secondary | ICD-10-CM | POA: Diagnosis not present

## 2023-06-07 DIAGNOSIS — I482 Chronic atrial fibrillation, unspecified: Secondary | ICD-10-CM | POA: Diagnosis present

## 2023-06-07 NOTE — Progress Notes (Signed)
 ADVANCED HF CLINIC NOTE  Referring Physician: Dr. Mariah Milling Primary Care: Karie Schwalbe, MD Primary Cardiologist: Dr. Lorenz Coaster  Chief complaint: heart failure  HPI:  [name] is a 83 year old woman with chronic AF, HTN, diastolic HF, moderate MR. PAD referred by Dr. Mariah Milling for further evaluation of her pulmonary HTN   Previously followed at [facility]. Previous echos with normal systolic function and severe MR. Transferred care to Dr. Mariah Milling in 2023.   Developed SOB in 11/23. Saw Dr. Alphonsus Sias. Felt to have HF. CXR showed pulmonary edema.   Saw Dr. Marcos Eke and walk test on 01/31/22 with sats in the 80s. Did ONOX with sats down to 75% Started on nighttime O2 and lasix and Kcl. BNP 501   Echo 12/23 EF 60-65% LA moderately dilated. Moderate MR. G3 DD. RV normal. RVSP 63mm HG  Non smoker. Husband was a smoker. No h/o DVT/PE. No h/o connective tissue disorder.  Hi res CT 3/24: No ILD. Air trapping c/w small airway disease. + lung nodules. + CAC  Echo 5/24 EF 55-60% G2DD RV normal RVSP 44 Personally reviewed Sleep study 5/24 AHI 3.7  Echo 3/25 EF 60-65% RVSP 32 mild to moderate AI Mod-severe MR (LV normal dimension)  Personally reviewed  Here with her daughters. Completed Pulmonary Rehab. Now going to [facility] 2x/week for an hour. 30 mins on TM on 30 mins on bike. Denies CP or undue SOB. No edema. Follows BP closely SBP 110-130 (one reading of 101)    4/24 1,080 (beginning Pulmonary Rehab)  8/24  1,555 (after Pulmonary Rehab)  PFTs 03/28/22 FEV1 1.21 (68%) FEVC 1.35 (56%) DLCO 60%  PFTs 04/08/22 FEV1 1.52 (87%) FVC 1.71 (72%) DLCO 55%  Past Medical History:  Diagnosis Date   Adenomatous colon polyp 2005   Allergy    perennial ; shots from Dr Corinda Gubler   Aortic insufficiency    Hyperlipidemia    Hypertension    Murmur    a.  04/2011 Echo:  EF 55-60%, mild-mod ai/mr   Squamous cell carcinoma of skin 08/05/2020   left pretibia EDC   Uterine cancer (HCC)      Current Outpatient Medications  Medication Sig Dispense Refill   amiodarone (PACERONE) 200 MG tablet Take 0.5 tablets (100 mg total) by mouth every other day.     amLODipine (NORVASC) 5 MG tablet TAKE 1 TABLET (5 MG TOTAL) BY MOUTH DAILY. 90 tablet 1   BREO ELLIPTA 100-25 MCG/ACT AEPB Inhale 1 puff into the lungs daily.     ELIQUIS 2.5 MG TABS tablet TAKE 1 TABLET BY MOUTH TWICE A DAY 180 tablet 1   EPINEPHrine 0.3 mg/0.3 mL IJ SOAJ injection Inject 0.3 mg into the muscle as needed for anaphylaxis.     famotidine (PEPCID) 20 MG tablet Take 20 mg by mouth daily.     FARXIGA 10 MG TABS tablet TAKE 1 TABLET BY MOUTH DAILY BEFORE BREAKFAST. 30 tablet 3   fexofenadine (ALLEGRA) 180 MG tablet Take 180 mg by mouth daily.     furosemide (LASIX) 20 MG tablet Take 1 tablet (20 mg total) by mouth daily. 180 tablet 2   glucosamine-chondroitin 500-400 MG tablet Take 1 tablet by mouth 2 (two) times daily. 1 pm     Krill Oil 500 MG CAPS Take by mouth.     mometasone (NASONEX) 50 MCG/ACT nasal spray PLACE 1 SPRAY INTO THE NOSE 2 (TWO) TIMES DAILY. 51 each 1   Multiple Vitamin (MULTIVITAMIN) capsule Take 1 capsule by  mouth daily.     OXYGEN Inhale into the lungs. 1 liter at bedtime     potassium chloride SA (KLOR-CON M20) 20 MEQ tablet Take 1 tablet (20 mEq total) by mouth daily. 90 tablet 3   pravastatin (PRAVACHOL) 40 MG tablet TAKE 1 TABLET BY MOUTH EVERYDAY AT BEDTIME 90 tablet 3   prednisoLONE acetate (PRED FORTE) 1 % ophthalmic suspension Apply to eye.     sertraline (ZOLOFT) 25 MG tablet TAKE 1 TABLET BY MOUTH EVERY DAY 90 tablet 3   sodium chloride (MURO 128) 5 % ophthalmic solution Place 1 drop into the left eye 2 (two) times daily.     valsartan (DIOVAN) 160 MG tablet TAKE 1 TABLET BY MOUTH TWICE A DAY 180 tablet 2   vitamin B-12 (CYANOCOBALAMIN) 500 MCG tablet Take 500 mcg by mouth daily.     fluticasone-salmeterol (WIXELA INHUB) 250-50 MCG/ACT AEPB Inhale 1 puff into the lungs in the morning  and at bedtime. (Patient not taking: Reported on 06/07/2023)     No current facility-administered medications for this visit.    Allergies  Allergen Reactions   Amlodipine Besy-Benazepril Hcl Swelling    Lotrel caused swelling of feet & facial swelling unilaterally   Amlodipine Besy-Benazepril Hcl Other (See Comments)   Lotrel [Amlodipine Besy-Benazepril Hcl]    Molds & Smuts Other (See Comments)   Wasp Venom Swelling    Allergic to wasps      Social History   Socioeconomic History   Marital status: Widowed    Spouse name: Not on file   Number of children: 2   Years of education: Not on file   Highest education level: Not on file  Occupational History   Occupation: Warehouse manager for [employer]    Comment: Retired  Tobacco Use   Smoking status: Never    Passive exposure: Past   Smokeless tobacco: Never  Vaping Use   Vaping status: Never Used  Substance and Sexual Activity   Alcohol use: Yes    Comment: special events   Drug use: No   Sexual activity: Never  Other Topics Concern   Not on file  Social History Narrative   Widowed 12/19      Has living will   Children now would be health care POA   Would accept resuscitation but no prolonged ventilation   No tube feeds if cognitively unaware      2 daughters, 4 grandsons 1 great-grandson   Retired Warehouse manager from [employer]   Social Drivers of Health   Financial Resource Strain: Low Risk  (05/14/2018)   Received from [facility], [facility]   Overall Financial Resource Strain (CARDIA)    Difficulty of Paying Living Expenses: Not hard at all  Food Insecurity: No Food Insecurity (05/14/2018)   Received from [facility], [facility]   Hunger Vital Sign    Worried About Running Out of Food in the Last Year: Never true    Ran Out of Food in the Last Year: Never true  Transportation Needs: No Transportation Needs (05/14/2018)   Received from [facility], [facility]   PRAPARE - Transportation    In the past 12 months, has lack of transportation kept you from medical appointments or from getting medications?: No    Lack of Transportation (Non-Medical): No  Physical Activity: Inactive (05/14/2018)   Received from [facility], [facility]   Exercise  Vital Sign    Days of Exercise per Week: 0 days    Minutes of Exercise per Session: 0 min  Stress: No Stress Concern Present (05/14/2018)   Received from [facility], [facility]   [employer] of Occupational Health - Occupational Stress Questionnaire    Feeling of Stress : Not at all  Social Connections: Unknown (05/14/2018)   Received from [facility], [facility]   Social Connection and Isolation Panel [NHANES]    Frequency of Communication with Friends and Family: Patient declined    Frequency of Social Gatherings with Friends and Family: Patient declined    Attends Religious Services: Patient declined    Database administrator or Organizations: Patient declined    Attends Banker Meetings: Patient declined    Marital Status: Patient declined  Catering manager Violence: Not on file      Family History  Problem Relation Age of Onset   Stroke Mother 73   Heart failure Mother    Heart attack Father 1   Lung cancer Father    Cancer Maternal Aunt         X 2; Gyn & ? primary   Diabetes Maternal Grandmother    Esophageal cancer Neg Hx    Colon cancer Neg Hx    Stomach cancer Neg Hx    Rectal cancer Neg Hx     Vitals:   06/07/23 0953  BP: (!) 148/54  Pulse: 76  SpO2: 99%  Weight: 125 lb (56.7 kg)     Wt Readings from Last 3 Encounters:  06/07/23 125 lb (56.7 kg)  04/26/23 124 lb 6.4 oz (56.4 kg)  12/26/22 124 lb 6.4 oz (56.4 kg)    PHYSICAL EXAM: General:  Well appearing. No resp difficulty HEENT:  normal Neck: supple. no JVD. Carotids 2+ bilat; no bruits. No lymphadenopathy or thryomegaly appreciated. Cor: PMI nondisplaced. Regular rate & rhythm. 2/6 MR + soft SEM RUSB Lungs: clear Abdomen: soft, nontender, nondistended. No hepatosplenomegaly. No bruits or masses. Good bowel sounds. Extremities: no cyanosis, clubbing, rash, edema Neuro: alert & orientedx3, cranial nerves grossly intact. moves all 4 extremities w/o difficulty. Affect pleasant   ASSESSMENT & PLAN:   1. 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 RV is normal - mainstay of therapy is volume control and BP management - However, recent PFTs with DLCO show restrictive physiology with a diffusion defect suggestive of interstitial process  - Echo 12/23 EF 60-65% LA moderately dilated. Moderate MR. G3 DD. RV normal. RVSP 63mm HG - Hi res CT 3/24: No ILD. Air trapping c/w small airway disease. + lung nodules. + CAC - Echo 5/24 EF 55-60% G2DD RV normal RVSP 44  -  Echo 3/25 EF 60-65% RVSP 32 mild to moderate AI Mod-severe MR (LV normal dimension)  Personally reviewed - Functional capacity much improved with diuresis and Pulmonary Rehab NYHA I-II - Volume status ok. Continue lasix 20 daily. - Jardiance stopped due to rash/allergy.  - Tolerating Farxiga well  - Chest CT 3/24 no ILD  - No role for  R +/- L heart cath at this time  - 8/45 (443m)  much improved with PR. She has continued to be active  2. Diastolic HF - Doing very well. NYHA I - Continue Farxiga and lasix  3.  Paroxysmal AF - Remains in NSR. Having occasional palpitations. Can place monitor as  needed - Continue Eliquis and low-dose amio 100 qod - Recent amio labs reviewed and ok  - Managed by Dr. Mariah Milling - Given normal EF and underlying lung disease, I previously raised th e possibility of switching off amio to flecainide but Dr. Mariah Milling discussed this with her and she preferred not to change.   4.  Hypoxemia - followed by Dr. Marcos Eke - likely multifactorial - Sleep study 5/24 AHI 3.7 - Continue O2  5. HTN - Blood pressure well controlled. Continue current regimen.  6. Mitral regurgitation - moderate to severe on echo (stable) - essentially asymptomatic - continue to follow  - can consider mTEER as needed  7. Aortic regurgitation - mild to moderate on echo - follow  Arvilla Meres, MD  10:18 AM

## 2023-06-07 NOTE — Patient Instructions (Signed)
 Follow-Up in: 6 MONTHS WITH DR. Gala Romney.  Our Doctors' schedules are NOT open yet for 6 months. We will place you on our recall list. Once they are available, we will call you to schedule your follow up appointment.   At the [facility], you and your health needs are our priority. We have a designated team specialized in the treatment of Heart Failure. This Care Team includes your primary Heart Failure Specialized Cardiologist (physician), Advanced Practice Providers (APPs- Physician Assistants and Nurse Practitioners), and Pharmacist who all work together to provide you with the care you need, when you need it.   You may see any of the following providers on your designated Care Team at your next follow up:  Dr. Arvilla Meres Dr. Marca Ancona Dr. Dorthula Nettles Dr. Theresia Bough Clarisa Kindred, FNP Enos Fling, RPH-CPP  Please be sure to bring in all your medications bottles to every appointment.   Need to Contact us:  If you have any questions or concerns before your next appointment please send [place] a message through [place] or call our office at [phone].    TO LEAVE A MESSAGE FOR THE NURSE SELECT OPTION 2, PLEASE LEAVE A MESSAGE INCLUDING: YOUR NAME DATE OF BIRTH CALL BACK NUMBER REASON FOR CALL**this is important as we prioritize the call backs  YOU WILL RECEIVE A CALL BACK THE SAME DAY AS LONG AS YOU CALL BEFORE 4:00 PM

## 2023-06-14 ENCOUNTER — Other Ambulatory Visit: Payer: Self-pay | Admitting: Internal Medicine

## 2023-06-20 DIAGNOSIS — J3089 Other allergic rhinitis: Secondary | ICD-10-CM | POA: Diagnosis not present

## 2023-06-25 ENCOUNTER — Other Ambulatory Visit: Payer: Self-pay | Admitting: Cardiovascular Disease

## 2023-06-26 DIAGNOSIS — J3089 Other allergic rhinitis: Secondary | ICD-10-CM | POA: Diagnosis not present

## 2023-06-26 NOTE — Telephone Encounter (Signed)
 Prescription refill request for Eliquis  received. Indication: PAF Last office visit: 06/07/23  D Bensimhon MD Scr: 0.88 on 11/13/22  Epic Age: 83 Weight: 56.7kg  Based on above finding Eliquis  2.5mg  twice daily is the appropriate dose.  Refill approved.

## 2023-07-03 ENCOUNTER — Other Ambulatory Visit: Payer: Self-pay | Admitting: Cardiovascular Disease

## 2023-07-17 DIAGNOSIS — J3089 Other allergic rhinitis: Secondary | ICD-10-CM | POA: Diagnosis not present

## 2023-07-17 DIAGNOSIS — J3081 Allergic rhinitis due to animal (cat) (dog) hair and dander: Secondary | ICD-10-CM | POA: Diagnosis not present

## 2023-07-17 DIAGNOSIS — J301 Allergic rhinitis due to pollen: Secondary | ICD-10-CM | POA: Diagnosis not present

## 2023-07-30 ENCOUNTER — Other Ambulatory Visit: Payer: Self-pay | Admitting: Cardiovascular Disease

## 2023-08-07 DIAGNOSIS — J301 Allergic rhinitis due to pollen: Secondary | ICD-10-CM | POA: Diagnosis not present

## 2023-08-07 DIAGNOSIS — J3089 Other allergic rhinitis: Secondary | ICD-10-CM | POA: Diagnosis not present

## 2023-08-07 DIAGNOSIS — J3081 Allergic rhinitis due to animal (cat) (dog) hair and dander: Secondary | ICD-10-CM | POA: Diagnosis not present

## 2023-08-09 ENCOUNTER — Other Ambulatory Visit: Payer: Self-pay | Admitting: Cardiovascular Disease

## 2023-08-14 DIAGNOSIS — J3081 Allergic rhinitis due to animal (cat) (dog) hair and dander: Secondary | ICD-10-CM | POA: Diagnosis not present

## 2023-08-14 DIAGNOSIS — J301 Allergic rhinitis due to pollen: Secondary | ICD-10-CM | POA: Diagnosis not present

## 2023-08-14 DIAGNOSIS — J3089 Other allergic rhinitis: Secondary | ICD-10-CM | POA: Diagnosis not present

## 2023-08-21 DIAGNOSIS — J3089 Other allergic rhinitis: Secondary | ICD-10-CM | POA: Diagnosis not present

## 2023-08-21 DIAGNOSIS — J3081 Allergic rhinitis due to animal (cat) (dog) hair and dander: Secondary | ICD-10-CM | POA: Diagnosis not present

## 2023-08-21 DIAGNOSIS — J301 Allergic rhinitis due to pollen: Secondary | ICD-10-CM | POA: Diagnosis not present

## 2023-08-28 DIAGNOSIS — J3081 Allergic rhinitis due to animal (cat) (dog) hair and dander: Secondary | ICD-10-CM | POA: Diagnosis not present

## 2023-08-28 DIAGNOSIS — J301 Allergic rhinitis due to pollen: Secondary | ICD-10-CM | POA: Diagnosis not present

## 2023-08-28 DIAGNOSIS — J3089 Other allergic rhinitis: Secondary | ICD-10-CM | POA: Diagnosis not present

## 2023-09-16 NOTE — Progress Notes (Unsigned)
 Cardiology Office Note  Date:  09/18/2023   ID:  [name], DOB 02/18/1941, MRN [id number]  PCP:  Jimmy Charlie FERNS, MD   Chief Complaint  Patient presents with   Follow-up    OD 6 month f/u no complaints today. Meds reviewed verbally with pt.    HPI:  [name] is a 83 year old woman with past medical history of Persistent atrial fibrillation Pulmonary hypertension, nonsmoker, wears oxygen  at night Followed by pulmonary Shortness of breath Moderate mitral valve regurgitation, severely dilated left atrium PAD with mild bilateral carotid disease Pulmonary pressures on echo initially estimated in the 60s now running low 30s on repeat echo May 2024 Who presents for follow-up of her persistent atrial fibrillation, pulmonary hypertension, moderate to severe mitral valve regurgitation Followed in CHF clinic  Last seen in clinic 8/24  Watching weight: dry weight 123 pounds Lasix  20 daily, no extra needed On farxiga , denies side effects Denies tachycardia or palpitations concerning for atrial fibrillation On amiodarone  100 mg every other day  Walks the dogs,  [place]  Blood pressure sometimes running low at home 103-110 systolic On average 1 teens up to 120 Denies orthostasis symptoms  Echo 12/23  EF 60-65% LA moderately dilated. Moderate MR. G3 DD. RV normal. RVSP 63mm HG   Echo March 2025 EF 60 to 65% Normal RV size and function Right heart pressures estimated 32 mmHg, moderately dilated left atrium, moderate to severe MR  Remains on nighttime oxygen  walk test December 2023 down to 80s, and overnight oximetry down to 75%  hi-res CT at last visit (2/24)  Hi res CT 3/24: No ILD. Air trapping c/w small airway disease. + lung nodules. + CAC   Echo 5/24 EF 55-60% G2DD RV normal RVSP 44  Sleep study 5/24 AHI 3.7  Did not tolerate Jardiance ,  Continues with pulmonary rehab [place] days a week Sleep study 5/24 AHI 3.7   EKG personally reviewed  by myself on todays visit EKG Interpretation Date/Time:  Tuesday September 18 2023 11:57:52 EDT Ventricular Rate:  62 PR Interval:  168 QRS Duration:  86 QT Interval:  444 QTC Calculation: 450 R Axis:   -10  Text Interpretation: Normal sinus rhythm Normal ECG When compared with ECG of 13-Nov-2022 12:52, No significant change was found Confirmed by Perla Lye [phone]) on 09/18/2023 11:58:54 AM   Prior imaging studies reviewed Echocardiogram 2020/stress echo showing severe MR Echocardiogram 2021 with mild to moderate MR  Echocardiogram October 2022 NORMAL LEFT VENTRICULAR SYSTOLIC FUNCTION   WITH MILD LVH  NORMAL RIGHT VENTRICULAR SYSTOLIC FUNCTION  TRIVIAL STENOSIS NOTED (See above)  MODERATE to SEVERE MR, TR  AVA(VTI)= 1.34cm^2  SCLEROTIC AoV  MILD AR, PR  EF 55%  SEVERE PULMONARY HTN    PMH:   has a past medical history of Adenomatous colon polyp (2005), Allergy, Aortic insufficiency, Hyperlipidemia, Hypertension, Murmur, Squamous cell carcinoma of skin (08/05/2020), and Uterine cancer (HCC).  PSH:    Past Surgical History:  Procedure Laterality Date   BLADDER SUSPENSION     CARPAL TUNNEL RELEASE Left    COLONOSCOPY  multiple   GLAUCOMA SURGERY     for narrow lines (? narrow angle?)   INTRAOCULAR LENS INSERTION Bilateral 10/22/2014; 11/26/2014   TOTAL ABDOMINAL HYSTERECTOMY W/ BILATERAL SALPINGOOPHORECTOMY     abnormal PAP    Current Outpatient Medications  Medication Sig Dispense Refill   amiodarone  (PACERONE ) 200 MG tablet TAKE 1 TABLET BY MOUTH EVERY DAY (Patient taking differently: Take 100  mg by mouth every other day.) 90 tablet 3   amLODipine  (NORVASC ) 5 MG tablet TAKE 1 TABLET (5 MG TOTAL) BY MOUTH DAILY. 90 tablet 1   dapagliflozin  propanediol (FARXIGA ) 10 MG TABS tablet TAKE 1 TABLET BY MOUTH DAILY BEFORE BREAKFAST. 30 tablet 5   ELIQUIS  2.5 MG TABS tablet TAKE 1 TABLET BY MOUTH TWICE A DAY 180 tablet 1   famotidine  (PEPCID ) 20 MG tablet Take 20 mg by mouth  daily.     furosemide  (LASIX ) 20 MG tablet TAKE 1 TABLET BY MOUTH TWICE EVERY OTHER DAY, ALTERNATING WITH 1 TABLET DAILY (Patient taking differently: Take 20 mg by mouth daily.) 180 tablet 2   glucosamine-chondroitin 500-400 MG tablet Take 1 tablet by mouth 2 (two) times daily. 1 pm     KLOR-CON  M20 20 MEQ tablet TAKE 1 TABLET (20 MEQ) TWICE EVERY OTHER DAY ALTERNATING WITH 1 TABLET (20 MEQ) ONCE EVERY OTHER DAY (Patient taking differently: 20 mEq daily.) 135 tablet 1   Krill Oil 500 MG CAPS Take by mouth.     mometasone  (NASONEX ) 50 MCG/ACT nasal spray PLACE 1 SPRAY INTO THE NOSE 2 (TWO) TIMES DAILY. 51 each 1   Multiple Vitamin (MULTIVITAMIN) capsule Take 1 capsule by mouth daily.     OXYGEN  Inhale into the lungs. 1 liter at bedtime     pravastatin  (PRAVACHOL ) 40 MG tablet TAKE 1 TABLET BY MOUTH EVERYDAY AT BEDTIME 90 tablet 3   prednisoLONE acetate (PRED FORTE) 1 % ophthalmic suspension Apply to eye.     sertraline  (ZOLOFT ) 25 MG tablet TAKE 1 TABLET BY MOUTH EVERY DAY 90 tablet 3   sodium chloride  (MURO 128) 5 % ophthalmic solution Place 1 drop into the left eye 2 (two) times daily.     valsartan  (DIOVAN ) 160 MG tablet TAKE 1 TABLET BY MOUTH TWICE A DAY 180 tablet 2   vitamin B-12 (CYANOCOBALAMIN) 500 MCG tablet Take 500 mcg by mouth daily.     albuterol  (VENTOLIN  HFA) 108 (90 Base) MCG/ACT inhaler Inhale 1 puff into the lungs every 6 (six) hours as needed.     BREO ELLIPTA  100-25 MCG/ACT AEPB Inhale 1 puff into the lungs daily. (Patient not taking: Reported on 09/18/2023)     cetirizine (ZYRTEC ALLERGY) 10 MG tablet Take 10 mg by mouth daily.     EPINEPHrine 0.3 mg/0.3 mL IJ SOAJ injection Inject 0.3 mg into the muscle as needed for anaphylaxis. (Patient not taking: Reported on 09/18/2023)     fexofenadine (ALLEGRA) 180 MG tablet Take 180 mg by mouth daily. (Patient not taking: Reported on 09/18/2023)     fluticasone -salmeterol (WIXELA INHUB) 250-50 MCG/ACT AEPB Inhale 1 puff into the lungs in  the morning and at bedtime. (Patient not taking: Reported on 09/18/2023)     No current facility-administered medications for this visit.     Allergies:   Amlodipine  besy-benazepril hcl, Amlodipine  besy-benazepril hcl, Lotrel [amlodipine  besy-benazepril hcl], Molds & smuts, and Wasp venom   Social History:  The patient  reports that she has never smoked. She has been exposed to tobacco smoke. She has never used smokeless tobacco. She reports current alcohol use. She reports that she does not use drugs.   Family History:   family history includes Cancer in her maternal aunt; Diabetes in her maternal grandmother; Heart attack (age of onset: 50) in her father; Heart failure in her mother; Lung cancer in her father; Stroke (age of onset: 14) in her mother.    Review of Systems: Review  of Systems  Constitutional: Negative.   HENT: Negative.    Respiratory: Negative.    Cardiovascular: Negative.   Gastrointestinal: Negative.   Musculoskeletal: Negative.   Neurological: Negative.   Psychiatric/Behavioral: Negative.    All other systems reviewed and are negative.   PHYSICAL EXAM: VS:  Ht 5' 3 (1.6 m)   Wt 125 lb 4 oz (56.8 kg)   BMI 22.19 kg/m  , BMI Body mass index is 22.19 kg/m. Constitutional:  oriented to person, place, and time. No distress.  HENT:  Head: Grossly normal Eyes:  no discharge. No scleral icterus.  Neck: No JVD, 1-2 + left greater than right carotid bruits  Cardiovascular: Regular rate and rhythm, no murmurs appreciated Pulmonary/Chest: Clear to auscultation bilaterally, no wheezes or rails Abdominal: Soft.  no distension.  no tenderness.  Musculoskeletal: Normal range of motion Neurological:  normal muscle tone. Coordination normal. No atrophy Skin: Skin warm and dry Psychiatric: normal affect, pleasant   Recent Labs: 11/13/2022: ALT 28; BUN 25; Creatinine, Ser 0.88; Hemoglobin 11.1; Platelets 239; Potassium 4.5; Sodium 139 12/28/2022: TSH 4.358    Lipid  Panel Lab Results  Component Value Date   CHOL 141 12/19/2021   HDL 50.20 12/19/2021   LDLCALC 77 12/19/2021   TRIG 68.0 12/19/2021      Wt Readings from Last 3 Encounters:  09/18/23 125 lb 4 oz (56.8 kg)  06/07/23 125 lb (56.7 kg)  04/26/23 124 lb 6.4 oz (56.4 kg)     ASSESSMENT AND PLAN:  Problem List Items Addressed This Visit       Cardiology Problems   Carotid artery disease (HCC)   Relevant Orders   EKG 12-Lead   Severe pulmonary hypertension (HCC)   Relevant Orders   EKG 12-Lead   Moderate mitral insufficiency   Relevant Orders   EKG 12-Lead   Essential hypertension   Relevant Orders   EKG 12-Lead   HYPERLIPIDEMIA   Relevant Orders   EKG 12-Lead     Other   Chronic respiratory failure with hypoxia (HCC)   Relevant Orders   EKG 12-Lead   Other Visit Diagnoses       Chronic diastolic heart failure (HCC)    -  Primary   Relevant Orders   EKG 12-Lead     Pulmonary hypertension, unspecified (HCC)       Relevant Orders   EKG 12-Lead     Mitral valve insufficiency, unspecified etiology       Relevant Orders   EKG 12-Lead     PAF (paroxysmal atrial fibrillation) (HCC)       Relevant Orders   EKG 12-Lead     SOB (shortness of breath)       Relevant Orders   EKG 12-Lead      Persistent atrial fibrillation Maintaining normal sinus rhythm She prefers to stay on amiodarone , now taking 100 mg every other day Prefers not to change to flecainide at this time Beta-blocker held for bradycardia  Mitral valve disorder Echo detailing stable moderate to severe MR,  Denies significant symptoms  Pulmonary hypertension Right heart pressures reduced on echo Tolerating Farxiga , Lasix  daily, valsartan  twice daily maintaining normal sinus rhythm Followed by CHF clinic  Essential hypertension Blood pressure is well controlled on today's visit. No changes made to the medications. For systolic pressure less than 110 may need to hold amlodipine   Routine lab  work ordered   Signed, Velinda Lunger, M.D., Ph.D. [facility] [place], [place] [phone]

## 2023-09-18 ENCOUNTER — Encounter: Payer: Self-pay | Admitting: Cardiovascular Disease

## 2023-09-18 ENCOUNTER — Ambulatory Visit: Attending: Cardiovascular Disease | Admitting: Cardiovascular Disease

## 2023-09-18 VITALS — BP 150/60 | HR 62 | Ht 63.0 in | Wt 125.2 lb

## 2023-09-18 DIAGNOSIS — I5032 Chronic diastolic (congestive) heart failure: Secondary | ICD-10-CM | POA: Insufficient documentation

## 2023-09-18 DIAGNOSIS — I272 Pulmonary hypertension, unspecified: Secondary | ICD-10-CM | POA: Insufficient documentation

## 2023-09-18 DIAGNOSIS — I340 Nonrheumatic mitral (valve) insufficiency: Secondary | ICD-10-CM | POA: Insufficient documentation

## 2023-09-18 DIAGNOSIS — E782 Mixed hyperlipidemia: Secondary | ICD-10-CM | POA: Diagnosis not present

## 2023-09-18 DIAGNOSIS — J9611 Chronic respiratory failure with hypoxia: Secondary | ICD-10-CM | POA: Insufficient documentation

## 2023-09-18 DIAGNOSIS — I6523 Occlusion and stenosis of bilateral carotid arteries: Secondary | ICD-10-CM | POA: Diagnosis not present

## 2023-09-18 DIAGNOSIS — J3081 Allergic rhinitis due to animal (cat) (dog) hair and dander: Secondary | ICD-10-CM | POA: Diagnosis not present

## 2023-09-18 DIAGNOSIS — I480 Paroxysmal atrial fibrillation: Secondary | ICD-10-CM | POA: Insufficient documentation

## 2023-09-18 DIAGNOSIS — J301 Allergic rhinitis due to pollen: Secondary | ICD-10-CM | POA: Diagnosis not present

## 2023-09-18 DIAGNOSIS — R0602 Shortness of breath: Secondary | ICD-10-CM | POA: Insufficient documentation

## 2023-09-18 DIAGNOSIS — J3089 Other allergic rhinitis: Secondary | ICD-10-CM | POA: Diagnosis not present

## 2023-09-18 DIAGNOSIS — Z79899 Other long term (current) drug therapy: Secondary | ICD-10-CM | POA: Insufficient documentation

## 2023-09-18 DIAGNOSIS — I10 Essential (primary) hypertension: Secondary | ICD-10-CM | POA: Diagnosis not present

## 2023-09-18 MED ORDER — POTASSIUM CHLORIDE CRYS ER 20 MEQ PO TBCR: 20.0000 meq | EXTENDED_RELEASE_TABLET | Freq: Every day | ORAL | 3 refills | Status: DC

## 2023-09-18 MED ORDER — VALSARTAN 160 MG PO TABS: 160.0000 mg | ORAL_TABLET | Freq: Two times a day (BID) | ORAL | 3 refills | Status: AC

## 2023-09-18 MED ORDER — AMLODIPINE BESYLATE 5 MG PO TABS: 5.0000 mg | ORAL_TABLET | Freq: Every day | ORAL | 3 refills | Status: AC

## 2023-09-18 MED ORDER — AMIODARONE HCL 200 MG PO TABS: 100.0000 mg | ORAL_TABLET | ORAL | 3 refills | Status: AC

## 2023-09-18 NOTE — Patient Instructions (Signed)
 Medication Instructions:  No changes  If you need a refill on your cardiac medications before your next appointment, please call your pharmacy.   Lab work: CMP, TSH, CBC  Testing/Procedures: No new testing needed  Follow-Up: At [facility], you and your health needs are our priority.  As part of our continuing mission to provide you with exceptional heart care, we have created designated Provider Care Teams.  These Care Teams include your primary Cardiologist (physician) and Advanced Practice Providers (APPs -  Physician Assistants and Nurse Practitioners) who all work together to provide you with the care you need, when you need it.  You will need a follow up appointment in 12 months  Providers on your designated Care Team:   Lonni Meager, NP Bernardino Bring, PA-C Cadence [place], [place]  COVID-19 Vaccine Information can be found at: [email or web address] For questions related to vaccine distribution or appointments, please email [email or web address] or call [phone].

## 2023-09-20 DIAGNOSIS — Z79899 Other long term (current) drug therapy: Secondary | ICD-10-CM | POA: Diagnosis not present

## 2023-09-21 LAB — CBC
Hematocrit: 31.2 % — ABNORMAL LOW (ref 34.0–46.6)
Hemoglobin: 10.3 g/dL — ABNORMAL LOW (ref 11.1–15.9)
MCH: 33.4 pg — ABNORMAL HIGH (ref 26.6–33.0)
MCHC: 33 g/dL (ref 31.5–35.7)
MCV: 101 fL — ABNORMAL HIGH (ref 79–97)
Platelets: 243 x10E3/uL (ref 150–450)
RBC: 3.08 x10E6/uL — ABNORMAL LOW (ref 3.77–5.28)
RDW: 18.8 % — ABNORMAL HIGH (ref 11.7–15.4)
WBC: 5 x10E3/uL (ref 3.4–10.8)

## 2023-09-21 LAB — COMPREHENSIVE METABOLIC PANEL WITH GFR
ALT: 21 IU/L (ref 0–32)
AST: 23 IU/L (ref 0–40)
Albumin: 4.6 g/dL (ref 3.7–4.7)
Alkaline Phosphatase: 57 IU/L (ref 44–121)
BUN/Creatinine Ratio: 27 (ref 12–28)
BUN: 22 mg/dL (ref 8–27)
Bilirubin Total: 0.7 mg/dL (ref 0.0–1.2)
CO2: 21 mmol/L (ref 20–29)
Calcium: 9.2 mg/dL (ref 8.7–10.3)
Chloride: 102 mmol/L (ref 96–106)
Creatinine, Ser: 0.81 mg/dL (ref 0.57–1.00)
Globulin, Total: 2.2 g/dL (ref 1.5–4.5)
Glucose: 145 mg/dL — ABNORMAL HIGH (ref 70–99)
Potassium: 4.1 mmol/L (ref 3.5–5.2)
Sodium: 140 mmol/L (ref 134–144)
Total Protein: 6.8 g/dL (ref 6.0–8.5)
eGFR: 72 mL/min/1.73 (ref >59)

## 2023-09-21 LAB — TSH: TSH: 7.59 u[IU]/mL — ABNORMAL HIGH (ref 0.450–4.500)

## 2023-10-01 ENCOUNTER — Other Ambulatory Visit: Payer: Self-pay | Admitting: Emergency Medicine

## 2023-10-01 DIAGNOSIS — Z79899 Other long term (current) drug therapy: Secondary | ICD-10-CM

## 2023-10-03 ENCOUNTER — Encounter: Payer: Self-pay | Admitting: Emergency Medicine

## 2023-10-03 NOTE — Telephone Encounter (Signed)
 Called patient and left detailed message per DPR. Detailed message with the following from Dr. Gollan.  The following is from Dr. Gollan in regards to your recent labs.   Overall labs look okay  We need to recheck her thyroid /TSH in 3 months time as it is mildly elevated and she is on amiodarone   Thx  TGollan    Your provider would like for you to return in 3 months to have the following labs drawn: Thyroid /TSH.    Please go to [facility] [address] ([facility] [address], [place] [address] You do not need an appointment.  They are open from 8 am- 4:30 pm.  Lunch from 1:00 pm- 2:00 pm You will not need to be fasting.  Left message for call back with any questions and letter mailed to patient.

## 2023-10-04 ENCOUNTER — Telehealth: Payer: Self-pay

## 2023-10-04 NOTE — Telephone Encounter (Signed)
 Copied from CRM #[id number]. Topic: General - Inquiry >> Oct 04, 2023  1:50 PM [place] wrote: Reason for CRM: Patient returning call for Dyane Grayce NOVAK, she can be reached on her cellphone

## 2023-10-06 ENCOUNTER — Other Ambulatory Visit: Payer: Self-pay | Admitting: Pulmonary Disease

## 2023-10-09 DIAGNOSIS — J3081 Allergic rhinitis due to animal (cat) (dog) hair and dander: Secondary | ICD-10-CM | POA: Diagnosis not present

## 2023-10-09 DIAGNOSIS — J3089 Other allergic rhinitis: Secondary | ICD-10-CM | POA: Diagnosis not present

## 2023-10-09 DIAGNOSIS — J301 Allergic rhinitis due to pollen: Secondary | ICD-10-CM | POA: Diagnosis not present

## 2023-10-17 DIAGNOSIS — H18599 Other hereditary corneal dystrophies, unspecified eye: Secondary | ICD-10-CM | POA: Diagnosis not present

## 2023-10-17 DIAGNOSIS — H353131 Nonexudative age-related macular degeneration, bilateral, early dry stage: Secondary | ICD-10-CM | POA: Diagnosis not present

## 2023-10-17 DIAGNOSIS — H26492 Other secondary cataract, left eye: Secondary | ICD-10-CM | POA: Diagnosis not present

## 2023-10-17 DIAGNOSIS — H35371 Puckering of macula, right eye: Secondary | ICD-10-CM | POA: Diagnosis not present

## 2023-10-30 DIAGNOSIS — J3089 Other allergic rhinitis: Secondary | ICD-10-CM | POA: Diagnosis not present

## 2023-10-30 DIAGNOSIS — J301 Allergic rhinitis due to pollen: Secondary | ICD-10-CM | POA: Diagnosis not present

## 2023-10-30 DIAGNOSIS — J3081 Allergic rhinitis due to animal (cat) (dog) hair and dander: Secondary | ICD-10-CM | POA: Diagnosis not present

## 2023-11-13 DIAGNOSIS — J3081 Allergic rhinitis due to animal (cat) (dog) hair and dander: Secondary | ICD-10-CM | POA: Diagnosis not present

## 2023-11-13 DIAGNOSIS — J3089 Other allergic rhinitis: Secondary | ICD-10-CM | POA: Diagnosis not present

## 2023-11-29 ENCOUNTER — Encounter

## 2023-11-30 ENCOUNTER — Other Ambulatory Visit: Payer: Self-pay

## 2023-11-30 ENCOUNTER — Ambulatory Visit (INDEPENDENT_AMBULATORY_CARE_PROVIDER_SITE_OTHER)

## 2023-11-30 VITALS — BP 114/50 | HR 83 | Temp 97.8°F | Ht 63.0 in | Wt 120.0 lb

## 2023-11-30 DIAGNOSIS — Z23 Encounter for immunization: Secondary | ICD-10-CM

## 2023-11-30 DIAGNOSIS — D539 Nutritional anemia, unspecified: Secondary | ICD-10-CM | POA: Diagnosis not present

## 2023-11-30 NOTE — Progress Notes (Signed)
 Subjective:    Patient ID: [name], female    DOB: January 26, 1941, 83 y.o.   MRN: [id number]   [name] is a very pleasant 83 y.o. female who presents today for chronic conditions. Hair falling out- shedding bits in shower, denies bald patches, endorses some fatigue, Fingernails breaking.   Review of Systems  All other systems reviewed and are negative.       Allergies  Allergen Reactions   Amlodipine  Besy-Benazepril Hcl Swelling    Lotrel caused swelling of feet & facial swelling unilaterally   Amlodipine  Besy-Benazepril Hcl Other (See Comments)   Lotrel [Amlodipine  Besy-Benazepril Hcl]    Molds & Smuts Other (See Comments)   Wasp Venom Swelling    Allergic to wasps    Current Outpatient Medications on File Prior to Visit  Medication Sig Dispense Refill   albuterol  (VENTOLIN  HFA) 108 (90 Base) MCG/ACT inhaler Inhale 1 puff into the lungs every 6 (six) hours as needed.     amiodarone  (PACERONE ) 200 MG tablet Take 0.5 tablets (100 mg total) by mouth every other day. 90 tablet 3   amLODipine  (NORVASC ) 5 MG tablet Take 1 tablet (5 mg total) by mouth daily. 90 tablet 3   dapagliflozin  propanediol (FARXIGA ) 10 MG TABS tablet TAKE 1 TABLET BY MOUTH DAILY BEFORE BREAKFAST. 30 tablet 5   ELIQUIS  2.5 MG TABS tablet TAKE 1 TABLET BY MOUTH TWICE A DAY 180 tablet 1   EPINEPHrine 0.3 mg/0.3 mL IJ SOAJ injection Inject 0.3 mg into the muscle as needed for anaphylaxis.     famotidine  (PEPCID ) 20 MG tablet Take 20 mg by mouth daily.     fexofenadine (ALLEGRA) 180 MG tablet Take 180 mg by mouth daily.     fluticasone -salmeterol (WIXELA INHUB) 250-50 MCG/ACT AEPB Inhale 1 puff into the lungs in the morning and at bedtime.     furosemide  (LASIX ) 20 MG tablet TAKE 1 TABLET BY MOUTH TWICE EVERY OTHER DAY, ALTERNATING WITH 1 TABLET DAILY 180 tablet 2   glucosamine-chondroitin 500-400 MG tablet Take 1 tablet by mouth 2 (two) times daily. 1 pm     Krill Oil 500 MG CAPS Take by mouth.      mometasone  (NASONEX ) 50 MCG/ACT nasal spray PLACE 1 SPRAY INTO THE NOSE 2 (TWO) TIMES DAILY. 51 each 1   Multiple Vitamin (MULTIVITAMIN) capsule Take 1 capsule by mouth daily.     Multiple Vitamins-Minerals (PRESERVISION AREDS 2 PO) Take by mouth.     OXYGEN  Inhale into the lungs. 1 liter at bedtime     Polyethyl Glycol-Propyl Glycol (LUBRICATING EYE DROPS OP) Apply to eye.     potassium chloride  SA (KLOR-CON  M20) 20 MEQ tablet Take 1 tablet (20 mEq total) by mouth daily. 90 tablet 3   pravastatin  (PRAVACHOL ) 40 MG tablet TAKE 1 TABLET BY MOUTH EVERYDAY AT BEDTIME 90 tablet 3   prednisoLONE acetate (PRED FORTE) 1 % ophthalmic suspension Apply to eye.     sertraline  (ZOLOFT ) 25 MG tablet TAKE 1 TABLET BY MOUTH EVERY DAY 90 tablet 3   sodium chloride  (MURO 128) 5 % ophthalmic solution Place 1 drop into the left eye 2 (two) times daily.     valsartan  (DIOVAN ) 160 MG tablet Take 1 tablet (160 mg total) by mouth 2 (two) times daily. 180 tablet 3   vitamin B-12 (CYANOCOBALAMIN) 500 MCG tablet Take 500 mcg by mouth daily.     No current facility-administered medications on file prior to visit.    BP (!) 114/50 (  BP Location: Left Arm, Patient Position: Sitting, Cuff Size: Normal) Comment: home reading this am.  Pulse 83   Temp 97.8 F (36.6 C) (Oral)   Ht 5' 3 (1.6 m)   Wt 120 lb (54.4 kg)   SpO2 98%   BMI 21.26 kg/m   Objective:    Physical Exam Vitals and nursing note reviewed.  Constitutional:      Appearance: Normal appearance.  HENT:     Head: Normocephalic and atraumatic.  Eyes:     Extraocular Movements: Extraocular movements intact.     Conjunctiva/sclera: Conjunctivae normal.  Skin:    General: Skin is warm.  Neurological:     Mental Status: She is alert.  Psychiatric:        Mood and Affect: Mood normal.        Behavior: Behavior normal.         Assessment & Plan:   1. Macrocytic anemia (Primary) Per chart review, patient has been anemic for several years,  was initially normocytic, but became macrocytic about a year ago, labs from 2 months ago, Hb 10.3 with MCV of 101.  Will rule out B12 and folate deficiency as a potential cause, LDH and reticulocyte count to rule out hemolysis as another potential cause. No history of alcohol use or GI bleed symptoms.  Medications could also be contributing to this, patient not currently on any antibiotics, chemotherapeutic agents, or antiretrovirals.  Will start with workup as below, if unremarkable, would consider more rare hematologic side effects of the more common medications she is on.   - B12 and Folate Panel; Future - Lactate Dehydrogenase; Future - Reticulocytes; Future   2. Need for influenza vaccination - Flu vaccine HIGH DOSE PF(Fluzone Trivalent)     Return in about 4 weeks (around 12/28/2023) for Medicare annual wellness.   Shalonda Sachse K Analisse Randle, MD  11/30/23

## 2023-11-30 NOTE — Progress Notes (Deleted)
 Subjective:    Patient ID: [name], female    DOB: 09/27/1940, 83 y.o.   MRN: [id number]   [name] is a very pleasant 83 y.o. female who presents today for chronic conditions. Hair falling out- shedding bits in shower, denies bald patches, endorses some fatigue, Fingernails breaking.  Review of Systems  All other systems reviewed and are negative.       Allergies  Allergen Reactions   Amlodipine  Besy-Benazepril Hcl Swelling    Lotrel caused swelling of feet & facial swelling unilaterally   Amlodipine  Besy-Benazepril Hcl Other (See Comments)   Lotrel [Amlodipine  Besy-Benazepril Hcl]    Molds & Smuts Other (See Comments)   Wasp Venom Swelling    Allergic to wasps    Current Outpatient Medications on File Prior to Visit  Medication Sig Dispense Refill   albuterol  (VENTOLIN  HFA) 108 (90 Base) MCG/ACT inhaler Inhale 1 puff into the lungs every 6 (six) hours as needed.     amiodarone  (PACERONE ) 200 MG tablet Take 0.5 tablets (100 mg total) by mouth every other day. 90 tablet 3   amLODipine  (NORVASC ) 5 MG tablet Take 1 tablet (5 mg total) by mouth daily. 90 tablet 3   dapagliflozin  propanediol (FARXIGA ) 10 MG TABS tablet TAKE 1 TABLET BY MOUTH DAILY BEFORE BREAKFAST. 30 tablet 5   ELIQUIS  2.5 MG TABS tablet TAKE 1 TABLET BY MOUTH TWICE A DAY 180 tablet 1   EPINEPHrine 0.3 mg/0.3 mL IJ SOAJ injection Inject 0.3 mg into the muscle as needed for anaphylaxis.     famotidine  (PEPCID ) 20 MG tablet Take 20 mg by mouth daily.     fexofenadine (ALLEGRA) 180 MG tablet Take 180 mg by mouth daily.     fluticasone -salmeterol (WIXELA INHUB) 250-50 MCG/ACT AEPB Inhale 1 puff into the lungs in the morning and at bedtime.     furosemide  (LASIX ) 20 MG tablet TAKE 1 TABLET BY MOUTH TWICE EVERY OTHER DAY, ALTERNATING WITH 1 TABLET DAILY 180 tablet 2   glucosamine-chondroitin 500-400 MG tablet Take 1 tablet by mouth 2 (two) times daily. 1 pm     Krill Oil 500 MG CAPS Take by mouth.      mometasone  (NASONEX ) 50 MCG/ACT nasal spray PLACE 1 SPRAY INTO THE NOSE 2 (TWO) TIMES DAILY. 51 each 1   Multiple Vitamin (MULTIVITAMIN) capsule Take 1 capsule by mouth daily.     Multiple Vitamins-Minerals (PRESERVISION AREDS 2 PO) Take by mouth.     OXYGEN  Inhale into the lungs. 1 liter at bedtime     Polyethyl Glycol-Propyl Glycol (LUBRICATING EYE DROPS OP) Apply to eye.     potassium chloride  SA (KLOR-CON  M20) 20 MEQ tablet Take 1 tablet (20 mEq total) by mouth daily. 90 tablet 3   pravastatin  (PRAVACHOL ) 40 MG tablet TAKE 1 TABLET BY MOUTH EVERYDAY AT BEDTIME 90 tablet 3   prednisoLONE acetate (PRED FORTE) 1 % ophthalmic suspension Apply to eye.     sertraline  (ZOLOFT ) 25 MG tablet TAKE 1 TABLET BY MOUTH EVERY DAY 90 tablet 3   sodium chloride  (MURO 128) 5 % ophthalmic solution Place 1 drop into the left eye 2 (two) times daily.     valsartan  (DIOVAN ) 160 MG tablet Take 1 tablet (160 mg total) by mouth 2 (two) times daily. 180 tablet 3   vitamin B-12 (CYANOCOBALAMIN) 500 MCG tablet Take 500 mcg by mouth daily.     cetirizine (ZYRTEC ALLERGY) 10 MG tablet Take 10 mg by mouth daily. (Patient not taking: Reported  on 11/30/2023)     No current facility-administered medications on file prior to visit.    BP (!) 114/50 (BP Location: Left Arm, Patient Position: Sitting, Cuff Size: Normal) Comment: home reading this am.  Pulse 83   Temp 97.8 F (36.6 C) (Oral)   Ht 5' 3 (1.6 m)   Wt 120 lb (54.4 kg)   SpO2 98%   BMI 21.26 kg/m   Objective:    Physical Exam Vitals and nursing note reviewed.  Constitutional:      Appearance: Normal appearance.  HENT:     Head: Normocephalic and atraumatic.  Eyes:     Extraocular Movements: Extraocular movements intact.     Conjunctiva/sclera: Conjunctivae normal.  Skin:    General: Skin is warm.  Neurological:     Mental Status: She is alert.  Psychiatric:        Mood and Affect: Mood normal.        Behavior: Behavior normal.             Assessment & Plan:   1. Macrocytic anemia (Primary) Per chart review, patient has been anemic for several years, was initially normocytic, but became macrocytic about a year ago, she is macrocytic as of labs from 2 months ago, Hb 10.3 with MCV of 101.  Will rule out B12 and folate deficiency as a potential cause, LDH and reticulocyte count to rule out hemolysis as another potential cause.  No history of alcohol use.  Extensively reviewed medication list to rule out medication side effect as a cause, patient not currently on any chemotherapeutic agents, antibiotics,  - B12 and Folate Panel; Future - Lactate Dehydrogenase; Future - Reticulocytes; Future  2. Need for influenza vaccination - Flu vaccine HIGH DOSE PF(Fluzone Trivalent)   No follow-ups on file.   Clancy Mullarkey K Nunzio Banet, MD  11/30/23

## 2023-11-30 NOTE — Telephone Encounter (Signed)
 Copied from CRM #[id number]. Topic: Clinical - Medication Refill >> Nov 30, 2023  4:47 PM Alfonso ORN wrote: Medication:  pravastatin  (PRAVACHOL ) 40 MG tablet sertraline  (ZOLOFT ) 25 MG tablet  Stated she needs it filled with other pcp name as discussed during today's appointment  Has the patient contacted their pharmacy? No   This is the patient's preferred pharmacy:  [id number] [address] [facility] - [address] [place] [address] Phone: [phone] Fax: [phone]   Is this the correct pharmacy for this prescription? Yes If no, delete pharmacy and type the correct one.   Has the prescription been filled recently? Yes  Is the patient out of the medication? No  Has the patient been seen for an appointment in the last year OR does the patient have an upcoming appointment? Yes  Can we respond through MyChart? No

## 2023-11-30 NOTE — Patient Instructions (Addendum)
 Thank you for visiting [facility] today! Here's what we talked about: -  I will be in touch about labs -  Annual exam in 4 weeks

## 2023-12-04 DIAGNOSIS — J301 Allergic rhinitis due to pollen: Secondary | ICD-10-CM | POA: Diagnosis not present

## 2023-12-04 DIAGNOSIS — J3089 Other allergic rhinitis: Secondary | ICD-10-CM | POA: Diagnosis not present

## 2023-12-04 DIAGNOSIS — J3081 Allergic rhinitis due to animal (cat) (dog) hair and dander: Secondary | ICD-10-CM | POA: Diagnosis not present

## 2023-12-07 ENCOUNTER — Ambulatory Visit

## 2023-12-07 MED ORDER — SERTRALINE HCL 25 MG PO TABS: 25.0000 mg | ORAL_TABLET | Freq: Every day | ORAL | 0 refills | Status: DC

## 2023-12-07 MED ORDER — PRAVASTATIN SODIUM 40 MG PO TABS: ORAL_TABLET | ORAL | 0 refills | Status: DC

## 2023-12-07 NOTE — Telephone Encounter (Signed)
 Pt is transferring to a different provider 01-04-24

## 2023-12-17 ENCOUNTER — Other Ambulatory Visit: Payer: Self-pay | Admitting: Internal Medicine

## 2023-12-19 ENCOUNTER — Telehealth: Payer: Self-pay | Admitting: Adult Health

## 2023-12-19 NOTE — Telephone Encounter (Signed)
 Called to confirm/remind patient of their appointment at [facility] on 12/20/23.   Appointment:   [] Confirmed  [x] Left mess   [] No answer/No voice mail  [] VM Full/unable to leave message  [] Phone not in service  Patient reminded to bring all medications and/or complete list.  Confirmed patient has transportation. Gave directions, instructed to utilize valet parking.

## 2023-12-20 ENCOUNTER — Encounter: Payer: Self-pay | Admitting: Adult Health

## 2023-12-20 ENCOUNTER — Other Ambulatory Visit: Payer: Self-pay | Admitting: Internal Medicine

## 2023-12-20 ENCOUNTER — Ambulatory Visit: Attending: Internal Medicine | Admitting: Adult Health

## 2023-12-20 VITALS — BP 158/57 | HR 85 | Ht 63.0 in | Wt 122.8 lb

## 2023-12-20 DIAGNOSIS — I5030 Unspecified diastolic (congestive) heart failure: Secondary | ICD-10-CM | POA: Diagnosis not present

## 2023-12-20 DIAGNOSIS — I110 Hypertensive heart disease with heart failure: Secondary | ICD-10-CM | POA: Diagnosis not present

## 2023-12-20 DIAGNOSIS — I080 Rheumatic disorders of both mitral and aortic valves: Secondary | ICD-10-CM | POA: Insufficient documentation

## 2023-12-20 DIAGNOSIS — I272 Pulmonary hypertension, unspecified: Secondary | ICD-10-CM | POA: Diagnosis not present

## 2023-12-20 DIAGNOSIS — Z79899 Other long term (current) drug therapy: Secondary | ICD-10-CM | POA: Diagnosis not present

## 2023-12-20 DIAGNOSIS — R0902 Hypoxemia: Secondary | ICD-10-CM | POA: Diagnosis not present

## 2023-12-20 DIAGNOSIS — I739 Peripheral vascular disease, unspecified: Secondary | ICD-10-CM | POA: Insufficient documentation

## 2023-12-20 DIAGNOSIS — I480 Paroxysmal atrial fibrillation: Secondary | ICD-10-CM | POA: Diagnosis not present

## 2023-12-20 DIAGNOSIS — I5032 Chronic diastolic (congestive) heart failure: Secondary | ICD-10-CM

## 2023-12-20 DIAGNOSIS — J984 Other disorders of lung: Secondary | ICD-10-CM | POA: Diagnosis not present

## 2023-12-20 DIAGNOSIS — Z9981 Dependence on supplemental oxygen: Secondary | ICD-10-CM | POA: Diagnosis not present

## 2023-12-20 DIAGNOSIS — Z8249 Family history of ischemic heart disease and other diseases of the circulatory system: Secondary | ICD-10-CM | POA: Insufficient documentation

## 2023-12-20 NOTE — Progress Notes (Signed)
 [facility] NOTE  Referring Physician: Dr. Perla Primary Care: Jimmy Charlie FERNS, MD Primary Cardiologist: Dr. Golllan  Chief complaint: Heart Failure   HPI: [name] is a 83 year old woman with chronic AF, HTN, diastolic HF, moderate MR and  PAD. Non smoker. Husband was a smoker. No h/o DVT/PE. No h/o connective tissue disorder.  Previously followed at [facility]. Previous echos with normal systolic function and severe MR. Transferred care to Dr. Gollan in 2023.   Developed SOB in 11/23. Saw Dr. Letvak. Felt to have HF. CXR showed pulmonary edema.   Saw Dr. Lenda and walk test on 01/31/22 with sats in the 80s. Did ONOX with sats down to 75% Started on nighttime O2 and lasix  and Kcl. BNP 501   Echo 12/23 EF 60-65% LA moderately dilated. Moderate MR. G3 DD. RV normal. RVSP 63mm HG  Hi res CT 3/24: No ILD. Air trapping c/w small airway disease. + lung nodules. + CAC  Echo 5/24 EF 55-60% G2DD RV normal RVSP 44 Personally reviewed Sleep study 5/24 AHI 3.7  Echo 3/25 EF 60-65% RVSP 32 mild to moderate AI Mod-severe MR (LV normal dimension)  Personally reviewed  Today she returns for HF follow up with her daughter. Overall feeling fine. Denies SOB/PND/Orthopnea. Goes to the gym Tuesdayand Thursday. Denies syncope/presyncope. Appetite ok. No fever or chills. Taking all medications.   4/24 1,080 (beginning Pulmonary Rehab)  8/24  1,555 (after Pulmonary Rehab)  PFTs 03/28/22 FEV1 1.21 (68%) FEVC 1.35 (56%) DLCO 60%  PFTs 04/08/22 FEV1 1.52 (87%) FVC 1.71 (72%) DLCO 55%  Past Medical History:  Diagnosis Date   Adenomatous colon polyp 2005   Allergy    perennial ; shots from Dr Cloretta   Aortic insufficiency    Hyperlipidemia    Hypertension    Murmur    a.  04/2011 Echo:  EF 55-60%, mild-mod ai/mr   Squamous cell carcinoma of skin 08/05/2020   left pretibia EDC   Uterine cancer (HCC)     Current Outpatient Medications  Medication Sig Dispense  Refill   albuterol  (VENTOLIN  HFA) 108 (90 Base) MCG/ACT inhaler Inhale 1 puff into the lungs every 6 (six) hours as needed.     amiodarone  (PACERONE ) 200 MG tablet Take 0.5 tablets (100 mg total) by mouth every other day. 90 tablet 3   amLODipine  (NORVASC ) 5 MG tablet Take 1 tablet (5 mg total) by mouth daily. 90 tablet 3   ELIQUIS  2.5 MG TABS tablet TAKE 1 TABLET BY MOUTH TWICE A DAY 180 tablet 1   EPINEPHrine 0.3 mg/0.3 mL IJ SOAJ injection Inject 0.3 mg into the muscle as needed for anaphylaxis.     famotidine  (PEPCID ) 20 MG tablet Take 20 mg by mouth daily.     FARXIGA  10 MG TABS tablet TAKE 1 TABLET BY MOUTH DAILY BEFORE BREAKFAST. 30 tablet 5   fexofenadine (ALLEGRA) 180 MG tablet Take 180 mg by mouth daily.     fluticasone -salmeterol (WIXELA INHUB) 250-50 MCG/ACT AEPB Inhale 1 puff into the lungs in the morning and at bedtime.     furosemide  (LASIX ) 20 MG tablet TAKE 1 TABLET BY MOUTH TWICE EVERY OTHER DAY, ALTERNATING WITH 1 TABLET DAILY 180 tablet 2   glucosamine-chondroitin 500-400 MG tablet Take 1 tablet by mouth 2 (two) times daily. 1 pm     Krill Oil 500 MG CAPS Take by mouth.     mometasone  (NASONEX ) 50 MCG/ACT nasal spray PLACE 1 SPRAY INTO THE NOSE 2 (  TWO) TIMES DAILY. 51 each 1   Multiple Vitamin (MULTIVITAMIN) capsule Take 1 capsule by mouth daily.     Multiple Vitamins-Minerals (PRESERVISION AREDS 2 PO) Take by mouth.     OXYGEN  Inhale into the lungs. 1 liter at bedtime     Polyethyl Glycol-Propyl Glycol (LUBRICATING EYE DROPS OP) Apply to eye.     potassium chloride  SA (KLOR-CON  M20) 20 MEQ tablet Take 1 tablet (20 mEq total) by mouth daily. 90 tablet 3   pravastatin  (PRAVACHOL ) 40 MG tablet TAKE 1 TABLET BY MOUTH EVERYDAY AT BEDTIME 90 tablet 0   prednisoLONE acetate (PRED FORTE) 1 % ophthalmic suspension Apply to eye.     sertraline  (ZOLOFT ) 25 MG tablet Take 1 tablet (25 mg total) by mouth daily. 90 tablet 0   sodium chloride  (MURO 128) 5 % ophthalmic solution Place 1  drop into the left eye 2 (two) times daily.     valsartan  (DIOVAN ) 160 MG tablet Take 1 tablet (160 mg total) by mouth 2 (two) times daily. 180 tablet 3   vitamin B-12 (CYANOCOBALAMIN) 500 MCG tablet Take 500 mcg by mouth daily.     No current facility-administered medications for this visit.    Allergies  Allergen Reactions   Amlodipine  Besy-Benazepril Hcl Swelling    Lotrel caused swelling of feet & facial swelling unilaterally   Amlodipine  Besy-Benazepril Hcl Other (See Comments)   Lotrel [Amlodipine  Besy-Benazepril Hcl]    Molds & Smuts Other (See Comments)   Wasp Venom Swelling    Allergic to wasps      Social History   Socioeconomic History   Marital status: Widowed    Spouse name: Not on file   Number of children: 2   Years of education: Not on file   Highest education level: Not on file  Occupational History   Occupation: Warehouse manager for [employer]    Comment: Retired  Tobacco Use   Smoking status: Never    Passive exposure: Past   Smokeless tobacco: Never  Vaping Use   Vaping status: Never Used  Substance and Sexual Activity   Alcohol use: Yes    Comment: special events   Drug use: No   Sexual activity: Never  Other Topics Concern   Not on file  Social History Narrative   Widowed 12/19      Has living will   Children now would be health care POA   Would accept resuscitation but no prolonged ventilation   No tube feeds if cognitively unaware      2 daughters, 4 grandsons 1 great-grandson   Retired Warehouse manager from [employer]   Social Drivers of [employer]: Low Risk  (05/14/2018)   Received from [facility]   Overall Financial Resource Strain (CARDIA)    Difficulty of Paying Living Expenses: Not hard at all  Food Insecurity: No Food Insecurity (05/14/2018)   Received from [facility]   Hunger Vital Sign    Within the past 12 months, you worried that your food would run out before you got the  money to buy more.: Never true    Within the past 12 months, the food you bought just didn't last and you didn't have money to get more.: Never true  Transportation Needs: No Transportation Needs (05/14/2018)   Received from [facility] - Transportation    In the past 12 months, has lack of transportation kept you from medical appointments or  from getting medications?: No    Lack of Transportation (Non-Medical): No  Physical Activity: Inactive (05/14/2018)   Received from [facility]   Exercise Vital Sign    On average, how many days per week do you engage in moderate to strenuous exercise (like a brisk walk)?: 0 days    On average, how many minutes do you engage in exercise at this level?: 0 min  Stress: No Stress Concern Present (05/14/2018)   Received from [facility] - Occupational Stress Questionnaire    Feeling of Stress : Not at all  Social Connections: Unknown (05/14/2018)   Received from [facility]   Social Connection and Isolation Panel    In a typical week, how many times do you talk on the phone with family, friends, or neighbors?: Patient declined    How often do you get together with friends or relatives?: Patient declined    How often do you attend church or religious services?: Patient declined    Do you belong to any clubs or organizations such as church groups, unions, fraternal or athletic groups, or school groups?: Patient declined    How often do you attend meetings of the clubs or organizations you belong to?: Patient declined    Are you married, widowed, divorced, separated, never married, or living with a partner?: Patient declined  Intimate Partner Violence: Not on file      Family History  Problem Relation Age of Onset   Stroke Mother 21   Heart failure Mother    Heart attack Father 20   Lung cancer Father    Cancer Maternal Aunt          X 2; Gyn & ? primary   Diabetes Maternal Grandmother    Esophageal cancer Neg Hx    Colon cancer Neg Hx    Stomach cancer Neg Hx    Rectal cancer Neg Hx     Vitals:   12/20/23 1526  BP: (!) 158/57  Pulse: 85  SpO2: 99%  Weight: 122 lb 12.8 oz (55.7 kg)  Height: 5' 3 (1.6 m)      Wt Readings from Last 3 Encounters:  12/20/23 122 lb 12.8 oz (55.7 kg)  11/30/23 120 lb (54.4 kg)  09/18/23 125 lb 4 oz (56.8 kg)    PHYSICAL EXAM: General:   No resp difficulty Neck: no JVD.  Cor: Regular rate & rhythm.  Lungs: clear Abdomen: soft, nontender, nondistended.  Extremities: no  edema Neuro: alert & oriented x3  ASSESSMENT & PLAN:   1. 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 RV is normal - mainstay of therapy is volume control and BP management - However, recent PFTs with DLCO show restrictive physiology with a diffusion defect suggestive of interstitial process  - Echo 12/23 EF 60-65% LA moderately dilated. Moderate MR. G3 DD. RV normal. RVSP 63mm HG - Hi res CT 3/24: No ILD. Air trapping c/w small airway disease. + lung nodules. + CAC - Echo 5/24 EF 55-60% G2DD RV normal RVSP 44  -  Echo 3/25 EF 60-65% RVSP 32 mild to moderate AI Mod-severe MR (LV normal dimension)  -Chest CT 3/24 no ILD  - Volume status ok. Continue lasix  20 daily. -Continue farxiga  10 mg dialy.  - No role for  R +/- L heart cath at this time   2. Diastolic HF - NYHA  II. Volume status stable. Continue current dose of lasix  + farxiga .   3.  Paroxysmal AF -Regular on exam.  - Continue Eliquis  and low-dose amio 100 qod - Managed by Dr. Gollan - Given normal EF and underlying lung disease, Dr Bensimhon previously raised the possibility of switching off amio to flecainide but Dr. Perla discussed this with her and she preferred not to change.   4. Hypoxemia - followed by Dr. Lenda - likely multifactorial - Sleep study 5/24 AHI 3.7 -  Continue O2 at bed time.   5. HTN - Blood pressure well controlled. Continue current regimen.  6. Mitral regurgitation - moderate to severe on echo (stable) - essentially asymptomatic - continue to follow  - can consider mTEER as needed  7. Aortic regurgitation - mild to moderate on echo   Follow up in 6 months with Dr Cherrie at [facility] or [facility]. I spent 30  minutes reviewing records, interviewing/examining patient, and managing orders.     Greig Mosses, NP  3:33 PM

## 2023-12-25 ENCOUNTER — Ambulatory Visit: Admitting: Physician Assistant

## 2023-12-25 ENCOUNTER — Ambulatory Visit (INDEPENDENT_AMBULATORY_CARE_PROVIDER_SITE_OTHER)

## 2023-12-25 ENCOUNTER — Encounter: Payer: Self-pay | Admitting: Physician Assistant

## 2023-12-25 VITALS — BP 162/70 | Ht 63.0 in | Wt 123.0 lb

## 2023-12-25 DIAGNOSIS — M4726 Other spondylosis with radiculopathy, lumbar region: Secondary | ICD-10-CM | POA: Diagnosis not present

## 2023-12-25 DIAGNOSIS — M25552 Pain in left hip: Secondary | ICD-10-CM | POA: Diagnosis not present

## 2023-12-25 DIAGNOSIS — M545 Low back pain, unspecified: Secondary | ICD-10-CM | POA: Diagnosis not present

## 2023-12-25 DIAGNOSIS — M5116 Intervertebral disc disorders with radiculopathy, lumbar region: Secondary | ICD-10-CM | POA: Diagnosis not present

## 2023-12-25 DIAGNOSIS — M5416 Radiculopathy, lumbar region: Secondary | ICD-10-CM

## 2023-12-25 DIAGNOSIS — M48061 Spinal stenosis, lumbar region without neurogenic claudication: Secondary | ICD-10-CM | POA: Diagnosis not present

## 2023-12-25 DIAGNOSIS — R29898 Other symptoms and signs involving the musculoskeletal system: Secondary | ICD-10-CM | POA: Diagnosis not present

## 2023-12-25 DIAGNOSIS — J3081 Allergic rhinitis due to animal (cat) (dog) hair and dander: Secondary | ICD-10-CM | POA: Diagnosis not present

## 2023-12-25 DIAGNOSIS — M4316 Spondylolisthesis, lumbar region: Secondary | ICD-10-CM | POA: Diagnosis not present

## 2023-12-25 DIAGNOSIS — M4185 Other forms of scoliosis, thoracolumbar region: Secondary | ICD-10-CM | POA: Diagnosis not present

## 2023-12-25 DIAGNOSIS — J301 Allergic rhinitis due to pollen: Secondary | ICD-10-CM | POA: Diagnosis not present

## 2023-12-25 DIAGNOSIS — J3089 Other allergic rhinitis: Secondary | ICD-10-CM | POA: Diagnosis not present

## 2023-12-25 MED ORDER — METHYLPREDNISOLONE 4 MG PO TBPK: ORAL_TABLET | ORAL | 0 refills | Status: DC

## 2023-12-25 NOTE — Progress Notes (Signed)
 Referring Physician:  Jimmy Charlie FERNS, MD No address on file  Primary Physician:  Jimmy Charlie FERNS, MD  History of Present Illness: 12/25/2023 [name] is here today with a chief complaint of new, severe left low back and buttock pain x 1 day.  It extends into the back of her left leg into the bottom of her foot.  It improves when she is sitting, but is constant.  She does not acknowledge an inciting event but says that a week ago she was moving boxes and furniture around the house.  She has noticed that she cannot put pressure on her left leg and needs to sit down.  Her gait has been affected significantly.  Denies any saddle anesthesia or new incontinence.    Weakness: none Bowel/Bladder Dysfunction: none  Conservative measures:  Physical therapy:  has not participated in Multimodal medical therapy including regular antiinflammatories: Tylenol   Injections: no epidural steroid injections  Past Surgery: no spine surgery  [name] has no symptoms of cervical myelopathy.  The symptoms are causing a significant impact on the patient's life.   Review of Systems:  A 10 point review of systems is negative, except for the pertinent positives and negatives detailed in the HPI.  Past Medical History: Past Medical History:  Diagnosis Date   Adenomatous colon polyp 2005   Allergy    perennial ; shots from Dr Cloretta   Aortic insufficiency    Hyperlipidemia    Hypertension    Murmur    a.  04/2011 Echo:  EF 55-60%, mild-mod ai/mr   Squamous cell carcinoma of skin 08/05/2020   left pretibia EDC   Uterine cancer (HCC)     Past Surgical History: Past Surgical History:  Procedure Laterality Date   BLADDER SUSPENSION     CARPAL TUNNEL RELEASE Left    COLONOSCOPY  multiple   GLAUCOMA SURGERY     for narrow lines (? narrow angle?)   INTRAOCULAR LENS INSERTION Bilateral 10/22/2014; 11/26/2014   TOTAL ABDOMINAL HYSTERECTOMY W/ BILATERAL  SALPINGOOPHORECTOMY     abnormal PAP    Allergies: Allergies as of 12/25/2023 - Review Complete 12/25/2023  Allergen Reaction Noted   Amlodipine  besy-benazepril hcl Swelling 05/08/2011   Amlodipine  besy-benazepril hcl Other (See Comments) 02/06/2022   Lotrel [amlodipine  besy-benazepril hcl]  08/25/2016   Molds & smuts Other (See Comments) 04/22/2018   Wasp venom Swelling 05/28/2015   Jardiance  [empagliflozin ] Rash 12/20/2023    Medications: Outpatient Encounter Medications as of 12/25/2023  Medication Sig   albuterol  (VENTOLIN  HFA) 108 (90 Base) MCG/ACT inhaler Inhale 1 puff into the lungs every 6 (six) hours as needed.   amiodarone  (PACERONE ) 200 MG tablet Take 0.5 tablets (100 mg total) by mouth every other day.   amLODipine  (NORVASC ) 5 MG tablet Take 1 tablet (5 mg total) by mouth daily.   ELIQUIS  2.5 MG TABS tablet TAKE 1 TABLET BY MOUTH TWICE A DAY   EPINEPHrine 0.3 mg/0.3 mL IJ SOAJ injection Inject 0.3 mg into the muscle as needed for anaphylaxis.   famotidine  (PEPCID ) 20 MG tablet Take 20 mg by mouth daily.   FARXIGA  10 MG TABS tablet TAKE 1 TABLET BY MOUTH DAILY BEFORE BREAKFAST.   fexofenadine (ALLEGRA) 180 MG tablet Take 180 mg by mouth daily.   furosemide  (LASIX ) 20 MG tablet TAKE 1 TABLET BY MOUTH TWICE EVERY OTHER DAY, ALTERNATING WITH 1 TABLET DAILY   glucosamine-chondroitin 500-400 MG tablet Take 1 tablet by mouth 2 (two) times daily. 1 pm  Krill Oil 500 MG CAPS Take by mouth.   mometasone  (NASONEX ) 50 MCG/ACT nasal spray PLACE 1 SPRAY INTO THE NOSE 2 (TWO) TIMES DAILY.   Multiple Vitamin (MULTIVITAMIN) capsule Take 1 capsule by mouth daily.   Multiple Vitamins-Minerals (PRESERVISION AREDS 2 PO) Take by mouth.   OXYGEN  Inhale into the lungs. 1 liter at bedtime   Polyethyl Glycol-Propyl Glycol (LUBRICATING EYE DROPS OP) Apply to eye. Twice a day drops   potassium chloride  SA (KLOR-CON  M20) 20 MEQ tablet Take 1 tablet (20 mEq total) by mouth daily.   pravastatin   (PRAVACHOL ) 40 MG tablet TAKE 1 TABLET BY MOUTH EVERYDAY AT BEDTIME   prednisoLONE acetate (PRED FORTE) 1 % ophthalmic suspension Apply to eye.   sertraline  (ZOLOFT ) 25 MG tablet Take 1 tablet (25 mg total) by mouth daily.   sodium chloride  (MURO 128) 5 % ophthalmic solution Place 1 drop into the left eye 2 (two) times daily.   valsartan  (DIOVAN ) 160 MG tablet Take 1 tablet (160 mg total) by mouth 2 (two) times daily.   vitamin B-12 (CYANOCOBALAMIN) 500 MCG tablet Take 500 mcg by mouth daily.   [DISCONTINUED] fluticasone -salmeterol (WIXELA INHUB) 250-50 MCG/ACT AEPB Inhale 1 puff into the lungs in the morning and at bedtime.   No facility-administered encounter medications on file as of 12/25/2023.    Social History: Social History   Tobacco Use   Smoking status: Never    Passive exposure: Past   Smokeless tobacco: Never  Vaping Use   Vaping status: Never Used  Substance Use Topics   Alcohol use: Yes    Comment: special events   Drug use: No    Family Medical History: Family History  Problem Relation Age of Onset   Stroke Mother 40   Heart failure Mother    Heart attack Father 40   Lung cancer Father    Cancer Maternal Aunt         X 2; Gyn & ? primary   Diabetes Maternal Grandmother    Esophageal cancer Neg Hx    Colon cancer Neg Hx    Stomach cancer Neg Hx    Rectal cancer Neg Hx     Physical Examination: @VITALWITHPAIN @  General: Patient is well developed, well nourished, calm, collected, and in no apparent distress. Attention to examination is appropriate.  Psychiatric: Patient is non-anxious.  Head:  Pupils equal, round, and reactive to light.  ENT:  Oral mucosa appears well hydrated.  Neck:   Supple.    Respiratory: Patient is breathing without any difficulty.  Extremities: No edema.  Vascular: Palpable dorsal pedal pulses.  Skin:   On exposed skin, there are no abnormal skin lesions.  NEUROLOGICAL:     Awake, alert, oriented to person, place,  and time.  Speech is clear and fluent. Fund of knowledge is appropriate.   Cranial Nerves: Pupils equal round and reactive to light.  Facial tone is symmetric.   ROM of spine: Minimal tenderness to palpation of left lumbar paraspinals   + SLR, 4 HF 3+ bilateral patella, absent left achilles   Strength: Side Biceps Triceps Deltoid Interossei Grip Wrist Ext. Wrist Flex.  R 5 5 5 5 5 5 5   L 5 5 5 5 5 5 5    Side Iliopsoas Quads Hamstring PF DF EHL  R 5 5 5 5 5 5   L 4 5 5  4/4- 5 5   Positive straight leg raise on the left Reflexes are 3+ in bilateral patella, absent left Achilles.  1+ right Achilles. Clonus is not present.  Toes are down-going.  Bilateral upper and lower extremity sensation is intact to light touch.    Gait is impeded secondary to pain.  Medical Decision Making  Imaging: No recent imaging of patient's lumbar spine.  Assessment and Plan: [name] is a pleasant 83 y.o. female with likely acute lumbar radiculopathy going down the posterior aspect of her left lower extremity into the bottom of her foot.  She does have some slight weakness on examination.  Unsure if this is due to significant pain.  Plan includes the following:  -X-rays today of lumbar spine.  Would like to ensure due to patient's age that she does not have any fracture. - Medrol  Dosepak given for pain.  Patient cannot take NSAIDs due to anticoagulants. - Physical therapy referral has been sent - MRI of the lumbar spine has been ordered and will be reviewed once complete. - Plan for follow-up in 6 to 8 weeks.    Thank you for involving me in the care of this patient.   Lyle Decamp, PA-C Dept. of Neurosurgery

## 2023-12-29 ENCOUNTER — Ambulatory Visit
Admission: RE | Admit: 2023-12-29 | Discharge: 2023-12-29 | Disposition: A | Discharge status: Home / Self Care | Source: Ambulatory Visit | Attending: Physician Assistant | Admitting: Physician Assistant

## 2023-12-29 DIAGNOSIS — M5116 Intervertebral disc disorders with radiculopathy, lumbar region: Secondary | ICD-10-CM | POA: Diagnosis not present

## 2023-12-29 DIAGNOSIS — M4726 Other spondylosis with radiculopathy, lumbar region: Secondary | ICD-10-CM | POA: Diagnosis not present

## 2023-12-29 DIAGNOSIS — M48061 Spinal stenosis, lumbar region without neurogenic claudication: Secondary | ICD-10-CM | POA: Diagnosis not present

## 2023-12-29 DIAGNOSIS — M5416 Radiculopathy, lumbar region: Secondary | ICD-10-CM

## 2024-01-03 ENCOUNTER — Ambulatory Visit: Payer: Self-pay | Admitting: Physician Assistant

## 2024-01-04 ENCOUNTER — Ambulatory Visit (INDEPENDENT_AMBULATORY_CARE_PROVIDER_SITE_OTHER)

## 2024-01-04 VITALS — BP 167/84 | HR 88 | Ht 63.0 in | Wt 122.7 lb

## 2024-01-04 DIAGNOSIS — J4540 Moderate persistent asthma, uncomplicated: Secondary | ICD-10-CM | POA: Diagnosis not present

## 2024-01-04 DIAGNOSIS — E782 Mixed hyperlipidemia: Secondary | ICD-10-CM

## 2024-01-04 DIAGNOSIS — I4819 Other persistent atrial fibrillation: Secondary | ICD-10-CM | POA: Diagnosis not present

## 2024-01-04 DIAGNOSIS — F39 Unspecified mood [affective] disorder: Secondary | ICD-10-CM | POA: Diagnosis not present

## 2024-01-04 DIAGNOSIS — I272 Pulmonary hypertension, unspecified: Secondary | ICD-10-CM

## 2024-01-04 DIAGNOSIS — I10 Essential (primary) hypertension: Secondary | ICD-10-CM | POA: Diagnosis not present

## 2024-01-04 DIAGNOSIS — J9611 Chronic respiratory failure with hypoxia: Secondary | ICD-10-CM

## 2024-01-04 MED ORDER — PRAVASTATIN SODIUM 40 MG PO TABS: ORAL_TABLET | ORAL | 3 refills | Status: AC

## 2024-01-04 MED ORDER — SERTRALINE HCL 25 MG PO TABS: 25.0000 mg | ORAL_TABLET | Freq: Every day | ORAL | 3 refills | Status: AC

## 2024-01-04 NOTE — Progress Notes (Signed)
 New patient visit   Patient: [name]   DOB: 11-19-1940   83 y.o. Female  MRN: [id number] Visit Date: 01/04/2024  Today's healthcare provider: Isaiah DELENA Pepper, MD   Chief Complaint  Patient presents with   Establish Care    Patient presents to establish care with new pcp. Sees cardiology and allergist Requests refills of pravastatin  and zoloft .  No acute concerns today, just want to establish with new doctor     Subjective    [name] is a 83 y.o. female who presents today as a new patient to establish care.   Discussed the use of AI scribe software for clinical note transcription with the patient, who gave verbal consent to proceed.  History of Present Illness [name] is an 83 year old female who presents for a new patient visit to transfer care after her previous provider retired. She is accompanied by her daughter, Arna.  She seeks medication refills and continuity of care. She has been on sertraline  for five to six years following her husband's passing, which effectively manages her symptoms. She also takes pravastatin  for cholesterol management and requires refills for both medications.  Her blood pressure is typically elevated in the office, but she reports good control at home. Her regimen includes amlodipine , valsartan , Lasix , and potassium, which she finds effective. She also takes Farxiga  for heart health.  She has a history of atrial fibrillation and takes amiodarone  and Eliquis . She takes amiodarone  at a low dose of 100 mg every other day and Eliquis  twice daily.  She uses oxygen  at night and has been informed she might be in the early stages of asthma. She uses a preventive inhaler, Wixela, to manage respiratory symptoms.  She had a bladder sling inserted a long time ago to manage urinary symptoms, which she found helpful.  She has never smoked and only drinks alcohol occasionally, such as on special occasions like  anniversaries.   Past Medical History:  Diagnosis Date   Adenomatous colon polyp 2005   Allergy    perennial ; shots from Dr Cloretta   Aortic insufficiency    Hyperlipidemia    Hypertension    Murmur    a.  04/2011 Echo:  EF 55-60%, mild-mod ai/mr   Squamous cell carcinoma of skin 08/05/2020   left pretibia EDC   Uterine cancer (HCC)    Past Surgical History:  Procedure Laterality Date   BLADDER SUSPENSION     CARPAL TUNNEL RELEASE Left    COLONOSCOPY  multiple   GLAUCOMA SURGERY     for narrow lines (? narrow angle?)   INTRAOCULAR LENS INSERTION Bilateral 10/22/2014; 11/26/2014   TOTAL ABDOMINAL HYSTERECTOMY W/ BILATERAL SALPINGOOPHORECTOMY     abnormal PAP   Family Status  Relation Name Status   Mother  Deceased   Father  Deceased   Mat Aunt  Deceased   MGM  Deceased   Neg Hx  (Not Specified)  No partnership data on file   Family History  Problem Relation Age of Onset   Stroke Mother 4   Heart failure Mother    Heart attack Father 19   Lung cancer Father    Cancer Maternal Aunt         X 2; Gyn & ? primary   Diabetes Maternal Grandmother    Esophageal cancer Neg Hx    Colon cancer Neg Hx    Stomach cancer Neg Hx    Rectal cancer Neg Hx    Social History  Socioeconomic History   Marital status: Widowed    Spouse name: Not on file   Number of children: 2   Years of education: Not on file   Highest education level: Not on file  Occupational History   Occupation: Warehouse Manager for [employer]    Comment: Retired  Tobacco Use   Smoking status: Never    Passive exposure: Past   Smokeless tobacco: Never  Vaping Use   Vaping status: Never Used  Substance and Sexual Activity   Alcohol use: Not Currently    Comment: special events   Drug use: No   Sexual activity: Never  Other Topics Concern   Not on file  Social History Narrative   Widowed 12/19      Has living will   Children now would be health care POA   Would accept resuscitation but no  prolonged ventilation   No tube feeds if cognitively unaware      2 daughters, 4 grandsons 1 great-grandson   Retired warehouse manager from [employer]   Social Drivers of [employer]: Low Risk  (05/14/2018)   Received from [facility]   Overall Financial Resource Strain (CARDIA)    Difficulty of Paying Living Expenses: Not hard at all  Food Insecurity: No Food Insecurity (05/14/2018)   Received from [facility]   Hunger Vital Sign    Within the past 12 months, you worried that your food would run out before you got the money to buy more.: Never true    Within the past 12 months, the food you bought just didn't last and you didn't have money to get more.: Never true  Transportation Needs: No Transportation Needs (05/14/2018)   Received from [facility] - Transportation    In the past 12 months, has lack of transportation kept you from medical appointments or from getting medications?: No    Lack of Transportation (Non-Medical): No  Physical Activity: Inactive (05/14/2018)   Received from [facility]   Exercise Vital Sign    On average, how many days per week do you engage in moderate to strenuous exercise (like a brisk walk)?: 0 days    On average, how many minutes do you engage in exercise at this level?: 0 min  Stress: No Stress Concern Present (05/14/2018)   Received from [facility] - Occupational Stress Questionnaire    Feeling of Stress : Not at all  Social Connections: Unknown (05/14/2018)   Received from [facility]   Social Connection and Isolation Panel    In a typical week, how many times do you talk on the phone with family, friends, or neighbors?: Patient declined    How often do you get together with friends or relatives?: Patient declined    How often do you attend church or religious services?:  Patient declined    Do you belong to any clubs or organizations such as church groups, unions, fraternal or athletic groups, or school groups?: Patient declined    How often do you attend meetings of the clubs or organizations you belong to?: Patient declined    Are you married, widowed, divorced, separated, never married, or living with a partner?: Patient declined   Outpatient Medications Prior to Visit  Medication Sig   albuterol  (VENTOLIN  HFA) 108 (90 Base) MCG/ACT inhaler Inhale 1 puff into the lungs every 6 (six)  hours as needed.   amiodarone  (PACERONE ) 200 MG tablet Take 0.5 tablets (100 mg total) by mouth every other day.   amLODipine  (NORVASC ) 5 MG tablet Take 1 tablet (5 mg total) by mouth daily.   ELIQUIS  2.5 MG TABS tablet TAKE 1 TABLET BY MOUTH TWICE A DAY   EPINEPHrine 0.3 mg/0.3 mL IJ SOAJ injection Inject 0.3 mg into the muscle as needed for anaphylaxis.   famotidine  (PEPCID ) 20 MG tablet Take 20 mg by mouth daily.   FARXIGA  10 MG TABS tablet TAKE 1 TABLET BY MOUTH DAILY BEFORE BREAKFAST.   fexofenadine (ALLEGRA) 180 MG tablet Take 180 mg by mouth daily.   furosemide  (LASIX ) 20 MG tablet TAKE 1 TABLET BY MOUTH TWICE EVERY OTHER DAY, ALTERNATING WITH 1 TABLET DAILY   glucosamine-chondroitin 500-400 MG tablet Take 1 tablet by mouth 2 (two) times daily. 1 pm   Krill Oil 500 MG CAPS Take by mouth.   methylPREDNISolone  (MEDROL  DOSEPAK) 4 MG TBPK tablet Take by mouth daily, taper daily dose per package instructions.   mometasone  (NASONEX ) 50 MCG/ACT nasal spray PLACE 1 SPRAY INTO THE NOSE 2 (TWO) TIMES DAILY.   Multiple Vitamin (MULTIVITAMIN) capsule Take 1 capsule by mouth daily.   Multiple Vitamins-Minerals (PRESERVISION AREDS 2 PO) Take by mouth.   OXYGEN  Inhale into the lungs. 1 liter at bedtime   Polyethyl Glycol-Propyl Glycol (LUBRICATING EYE DROPS OP) Apply to eye. Twice a day drops   potassium chloride  SA (KLOR-CON  M20) 20 MEQ tablet Take 1 tablet (20 mEq total) by mouth  daily.   prednisoLONE acetate (PRED FORTE) 1 % ophthalmic suspension Apply to eye.   sodium chloride  (MURO 128) 5 % ophthalmic solution Place 1 drop into the left eye 2 (two) times daily.   valsartan  (DIOVAN ) 160 MG tablet Take 1 tablet (160 mg total) by mouth 2 (two) times daily.   vitamin B-12 (CYANOCOBALAMIN) 500 MCG tablet Take 500 mcg by mouth daily.   WIXELA INHUB 250-50 MCG/ACT AEPB Inhale 1 puff into the lungs 2 (two) times daily.   [DISCONTINUED] pravastatin  (PRAVACHOL ) 40 MG tablet TAKE 1 TABLET BY MOUTH EVERYDAY AT BEDTIME   [DISCONTINUED] sertraline  (ZOLOFT ) 25 MG tablet Take 1 tablet (25 mg total) by mouth daily.   No facility-administered medications prior to visit.   Allergies  Allergen Reactions   Amlodipine  Besy-Benazepril Hcl Swelling    Lotrel caused swelling of feet & facial swelling unilaterally   Amlodipine  Besy-Benazepril Hcl Other (See Comments)   Lotrel [Amlodipine  Besy-Benazepril Hcl]    Molds & Smuts Other (See Comments)   Wasp Venom Swelling    Allergic to wasps   Jardiance  [Empagliflozin ] Rash    Reviews of Systems as noted in HPI.      Objective    BP (!) 167/84 (BP Location: Left Arm, Patient Position: Sitting, Cuff Size: Normal)   Pulse 88   Ht 5' 3 (1.6 m)   Wt 122 lb 11.2 oz (55.7 kg)   SpO2 97%   BMI 21.74 kg/m     Physical Exam Constitutional:      Appearance: Normal appearance.  HENT:     Head: Normocephalic and atraumatic.     Mouth/Throat:     Mouth: Mucous membranes are moist.  Eyes:     Pupils: Pupils are equal, round, and reactive to light.  Cardiovascular:     Rate and Rhythm: Normal rate and regular rhythm.  Pulmonary:     Effort: Pulmonary effort is normal.     Breath  sounds: Normal breath sounds.  Skin:    General: Skin is warm.  Neurological:     General: No focal deficit present.     Mental Status: She is alert.     Depression Screen    01/04/2024    3:20 PM 11/30/2023   11:37 AM 12/25/2022    9:26 AM  12/25/2022    9:10 AM  PHQ 2/9 Scores  PHQ - 2 Score 0 0 0 0  PHQ- 9 Score 0      No results found for any visits on 01/04/24.  Assessment & Plan      Problem List Items Addressed This Visit       Cardiovascular and Mediastinum   Essential hypertension   Relevant Medications   pravastatin  (PRAVACHOL ) 40 MG tablet   Atrial fibrillation (HCC) - Primary   Relevant Medications   pravastatin  (PRAVACHOL ) 40 MG tablet   Severe pulmonary hypertension (HCC)   Relevant Medications   pravastatin  (PRAVACHOL ) 40 MG tablet     Respiratory   Chronic respiratory failure with hypoxia (HCC)   Asthma   Relevant Medications   WIXELA INHUB 250-50 MCG/ACT AEPB     Other   HYPERLIPIDEMIA   Relevant Medications   pravastatin  (PRAVACHOL ) 40 MG tablet   Mood disorder   Relevant Medications   sertraline  (ZOLOFT ) 25 MG tablet    Assessment & Plan Mixed hyperlipidemia Chronic, controlled with pravastatin . - Refilled pravastatin  40mg  today  Mood Disorder Chronic, controlled on sertraline  for 5-6 years. - Refilled sertraline  25mg  today  Atrial fibrillation (HCC) Chronic, controlled with amiodarone  and Eliquis . No recent episodes reported. - Continue medications as above - Follow up with cardiology  Hypertension Uncontrolled in clinic, but controlled based on home readings. - Continue amlodipine , valsartan  - Continue monitoring home BP  Severe Pulmonary HTN (HCC) Chronic respiratory failure with hypoxia (HCC) Chronic, stable. Severe pulmonary hypertension related to mitral disease and diastolic dysfunction. Requires 1L O2 at night.  - Continue lasix  20mg   - Continue Farxiga   Moderate Persistent Asthma Chronic, controlled. Stable with Wixela inhaler BID and albuterol  PRN. No recent exacerbations. - Continue inhalers as above   Return in about 4 weeks (around 02/01/2024) for AWV with Medicare Team.      Isaiah DELENA Pepper, MD  [facility] [phone]  (phone) [phone] (fax)

## 2024-01-04 NOTE — Patient Instructions (Signed)
 I value your feedback, so if you receive a survey, please take the time to fill it out. Thank you for choosing [facility]!

## 2024-01-07 ENCOUNTER — Telehealth: Payer: Self-pay

## 2024-01-07 NOTE — Telephone Encounter (Signed)
 I contacted [name] to schedule her AWVS/CPE. She was unavailable. I Left a message asking her to return my call to schedule her CPE with Dr. Franchot.

## 2024-01-09 ENCOUNTER — Ambulatory Visit (INDEPENDENT_AMBULATORY_CARE_PROVIDER_SITE_OTHER): Admitting: Physician Assistant

## 2024-01-09 VITALS — BP 154/72 | Ht 63.0 in | Wt 122.0 lb

## 2024-01-09 DIAGNOSIS — M5416 Radiculopathy, lumbar region: Secondary | ICD-10-CM | POA: Diagnosis not present

## 2024-01-09 DIAGNOSIS — M5136 Other intervertebral disc degeneration, lumbar region with discogenic back pain only: Secondary | ICD-10-CM

## 2024-01-09 NOTE — Progress Notes (Signed)
 Referring Physician:  Jimmy Charlie FERNS, MD No address on file  Primary Physician:  Franchot Isaiah LABOR, MD  History of Present Illness:  [name] is here today with a chief complaint of new, severe left low back and buttock pain x 1 day.  It extends into the back of her left leg into the bottom of her foot.  It improves when she is sitting, but is constant.  She does not acknowledge an inciting event but says that a week ago she was moving boxes and furniture around the house.  She has noticed that she cannot put pressure on her left leg and needs to sit down.  Her gait has been affected significantly.  Denies any saddle anesthesia or new incontinence.  01/09/24 Patient was seen in follow-up today for left lumbar radiculopathy.  Her pain has improved since she was last seen and it is no longer going down her leg.  At times she will have some achiness and spasms in her foot and outside of her calf, but not nearly as bad as it was when she was last seen.  She does periodically get pins-and-needles to the top of her foot.  She is using IcyHot on her leg and back which has helped.  No new weakness.     Not been back to the gymn  Weakness: none Bowel/Bladder Dysfunction: none  Conservative measures:  Physical therapy:  has not participated in Multimodal medical therapy including regular antiinflammatories: Tylenol   Injections: no epidural steroid injections  Past Surgery: no spine surgery  [name] has no symptoms of cervical myelopathy.  The symptoms are causing a significant impact on the patient's life.   Review of Systems:  A 10 point review of systems is negative, except for the pertinent positives and negatives detailed in the HPI.  Past Medical History: Past Medical History:  Diagnosis Date   Adenomatous colon polyp 2005   Allergy    perennial ; shots from Dr Cloretta   Aortic insufficiency    Hyperlipidemia    Hypertension    Murmur    a.  04/2011  Echo:  EF 55-60%, mild-mod ai/mr   Squamous cell carcinoma of skin 08/05/2020   left pretibia EDC   Uterine cancer (HCC)     Past Surgical History: Past Surgical History:  Procedure Laterality Date   BLADDER SUSPENSION     CARPAL TUNNEL RELEASE Left    COLONOSCOPY  multiple   GLAUCOMA SURGERY     for narrow lines (? narrow angle?)   INTRAOCULAR LENS INSERTION Bilateral 10/22/2014; 11/26/2014   TOTAL ABDOMINAL HYSTERECTOMY W/ BILATERAL SALPINGOOPHORECTOMY     abnormal PAP    Allergies: Allergies as of 01/09/2024 - Review Complete 01/09/2024  Allergen Reaction Noted   Amlodipine  besy-benazepril hcl Swelling 05/08/2011   Amlodipine  besy-benazepril hcl Other (See Comments) 02/06/2022   Lotrel [amlodipine  besy-benazepril hcl]  08/25/2016   Molds & smuts Other (See Comments) 04/22/2018   Wasp venom Swelling 05/28/2015   Jardiance  [empagliflozin ] Rash 12/20/2023    Medications: Outpatient Encounter Medications as of 01/09/2024  Medication Sig   albuterol  (VENTOLIN  HFA) 108 (90 Base) MCG/ACT inhaler Inhale 1 puff into the lungs every 6 (six) hours as needed.   amiodarone  (PACERONE ) 200 MG tablet Take 0.5 tablets (100 mg total) by mouth every other day.   amLODipine  (NORVASC ) 5 MG tablet Take 1 tablet (5 mg total) by mouth daily.   ELIQUIS  2.5 MG TABS tablet TAKE 1 TABLET BY MOUTH TWICE A  DAY   EPINEPHrine 0.3 mg/0.3 mL IJ SOAJ injection Inject 0.3 mg into the muscle as needed for anaphylaxis.   famotidine  (PEPCID ) 20 MG tablet Take 20 mg by mouth daily.   FARXIGA  10 MG TABS tablet TAKE 1 TABLET BY MOUTH DAILY BEFORE BREAKFAST.   fexofenadine (ALLEGRA) 180 MG tablet Take 180 mg by mouth daily.   furosemide  (LASIX ) 20 MG tablet Take 20 mg by mouth daily.   glucosamine-chondroitin 500-400 MG tablet Take 1 tablet by mouth 2 (two) times daily. 1 pm   Krill Oil 500 MG CAPS Take by mouth.   mometasone  (NASONEX ) 50 MCG/ACT nasal spray PLACE 1 SPRAY INTO THE NOSE 2 (TWO) TIMES DAILY.    Multiple Vitamin (MULTIVITAMIN) capsule Take 1 capsule by mouth daily.   Multiple Vitamins-Minerals (PRESERVISION AREDS 2 PO) Take by mouth.   OXYGEN  Inhale into the lungs. 1 liter at bedtime   Polyethyl Glycol-Propyl Glycol (LUBRICATING EYE DROPS OP) Apply to eye. Twice a day drops   potassium chloride  SA (KLOR-CON  M20) 20 MEQ tablet Take 1 tablet (20 mEq total) by mouth daily.   pravastatin  (PRAVACHOL ) 40 MG tablet TAKE 1 TABLET BY MOUTH EVERYDAY AT BEDTIME   prednisoLONE acetate (PRED FORTE) 1 % ophthalmic suspension Apply to eye.   sertraline  (ZOLOFT ) 25 MG tablet Take 1 tablet (25 mg total) by mouth daily.   sodium chloride  (MURO 128) 5 % ophthalmic solution Place 1 drop into the left eye 2 (two) times daily.   valsartan  (DIOVAN ) 160 MG tablet Take 1 tablet (160 mg total) by mouth 2 (two) times daily.   vitamin B-12 (CYANOCOBALAMIN) 500 MCG tablet Take 500 mcg by mouth daily.   WIXELA INHUB 250-50 MCG/ACT AEPB Inhale 1 puff into the lungs 2 (two) times daily.   [DISCONTINUED] furosemide  (LASIX ) 20 MG tablet TAKE 1 TABLET BY MOUTH TWICE EVERY OTHER DAY, ALTERNATING WITH 1 TABLET DAILY   [DISCONTINUED] methylPREDNISolone  (MEDROL  DOSEPAK) 4 MG TBPK tablet Take by mouth daily, taper daily dose per package instructions.   No facility-administered encounter medications on file as of 01/09/2024.    Social History: Social History   Tobacco Use   Smoking status: Never    Passive exposure: Past   Smokeless tobacco: Never  Vaping Use   Vaping status: Never Used  Substance Use Topics   Alcohol use: Not Currently    Comment: special events   Drug use: No    Family Medical History: Family History  Problem Relation Age of Onset   Stroke Mother 24   Heart failure Mother    Heart attack Father 72   Lung cancer Father    Cancer Maternal Aunt         X 2; Gyn & ? primary   Diabetes Maternal Grandmother    Esophageal cancer Neg Hx    Colon cancer Neg Hx    Stomach cancer Neg Hx     Rectal cancer Neg Hx     Physical Examination: @VITALWITHPAIN @  General: Patient is well developed, well nourished, calm, collected, and in no apparent distress. Attention to examination is appropriate.  Psychiatric: Patient is non-anxious.  Head:  Pupils equal, round, and reactive to light.  ENT:  Oral mucosa appears well hydrated.  Neck:   Supple.    Respiratory: Patient is breathing without any difficulty.  Extremities: No edema.  Vascular: Palpable dorsal pedal pulses.  Skin:   On exposed skin, there are no abnormal skin lesions.  NEUROLOGICAL:     Awake, alert,  oriented to person, place, and time.  Speech is clear and fluent. Fund of knowledge is appropriate.   Cranial Nerves: Pupils equal round and reactive to light.  Facial tone is symmetric.   ROM of spine: Minimal tenderness to palpation of left lumbar paraspinals    Strength:  Side Iliopsoas Quads Hamstring PF DF EHL  R 5 5 5 5 5 5   L 4 5 5 5  4- 5   Positive straight leg raise on the left Reflexes are 3+ in bilateral patella, absent left Achilles.  1+ right Achilles. Clonus is not present.  Toes are down-going.  Bilateral upper and lower extremity sensation is intact to light touch.    Gait is impeded secondary to pain.  Medical Decision Making  Imaging: EXAM: MRI LUMBAR SPINE 12/29/2023 04:25:57 PM   TECHNIQUE: Multiplanar multisequence MRI of the lumbar spine was performed without the administration of intravenous contrast.   COMPARISON: None available.   CLINICAL HISTORY: Acute lumbar radiculopathy, weakness, history of gait and Uterine cancer, NKI.   FINDINGS:   BONES AND ALIGNMENT: There is mild rotatory levoscoliosis of the lumbar spine. Normal vertebral body heights. The vertebral bodies are heterogeneous in signal intensity. There are no suspicious lesions present to suggest metastatic disease. There are discogenic reactive changes present within the endplates throughout the  lumbar spine.   SPINAL CORD: The conus medullaris terminates at T12-L1.   SOFT TISSUES: No paraspinal mass.   L1-L2: There is mild disc space narrowing, but no disc herniation. The spinal canal and neural foramina are widely patent.   L2-L3: There is diffuse disc bulging and a concentric annular tear. There is mild central spinal canal stenosis and mild-to-moderate bilateral lateral recess stenosis, but no apparent nerve root impingement.   L3-L4: There is a broad-based disc bulge present, which is eccentric to the right causing mild-to-moderate central spinal canal stenosis and moderate right lateral recess and neural foraminal stenosis. There is impingement of the right L4 nerve in the lateral recess. There is mild-to-moderate left lateral recess stenosis, but no left nerve root impingement evident.   L4-L5: There is diffuse disc bulging and bilateral facet arthrosis with mild-to-moderate central spinal canal stenosis and mild-to-moderate bilateral lateral recess stenosis. There is questionable impingement of the L5 nerves in the lateral recesses.   L5-S1: There is diffuse disc bulging and bilateral facet arthrosis with mild central spinal canal stenosis and bilateral neural foraminal stenosis. No apparent nerve root impingement.   GALLBLADDER: The stone is incidentally seen layering independently within the gallbladder.     IMPRESSION: 1. L3-4: Broad-based disc bulge eccentric to the right causing mild-to-moderate central canal stenosis, moderate right lateral recess and right neural foraminal stenosis with impingement of the right L4 nerve; mild-to-moderate left lateral recess stenosis without left nerve root impingement. 2. L4-5: Diffuse disc bulge and bilateral facet arthrosis causing mild-to-moderate central canal stenosis and mild-to-moderate bilateral lateral recess stenosis; questionable impingement of the L5 nerves in the lateral recesses.    Assessment  and Plan: [name] is a pleasant 83 y.o. female comes in today for follow-up for left lumbar radiculopathy.  This is largely in the L4-5 distribution.  Patient does also have disc bulge at L3-4.  She had a great result with the steroids that she was using for pain.  She is doing much better than her initial visit.  Plan includes the following moving forward:   - Physical therapy - Discussed with patient the possibility of injections.  Will hold off for  now since she is improving substantially.  Happy to put in a referral for this whenever she would like. - See back in 6 weeks.   Thank you for involving me in the care of this patient.   Lyle Decamp, PA-C Dept. of Neurosurgery

## 2024-01-10 ENCOUNTER — Other Ambulatory Visit: Payer: Self-pay | Admitting: Emergency Medicine

## 2024-01-10 DIAGNOSIS — M545 Low back pain, unspecified: Secondary | ICD-10-CM | POA: Diagnosis not present

## 2024-01-10 DIAGNOSIS — M79669 Pain in unspecified lower leg: Secondary | ICD-10-CM | POA: Diagnosis not present

## 2024-01-10 DIAGNOSIS — Z79899 Other long term (current) drug therapy: Secondary | ICD-10-CM | POA: Diagnosis not present

## 2024-01-11 LAB — TSH: TSH: 3.57 u[IU]/mL (ref 0.450–4.500)

## 2024-01-12 ENCOUNTER — Ambulatory Visit: Payer: Self-pay | Admitting: Cardiovascular Disease

## 2024-01-14 DIAGNOSIS — M79669 Pain in unspecified lower leg: Secondary | ICD-10-CM | POA: Diagnosis not present

## 2024-01-14 DIAGNOSIS — M545 Low back pain, unspecified: Secondary | ICD-10-CM | POA: Diagnosis not present

## 2024-01-15 DIAGNOSIS — J3089 Other allergic rhinitis: Secondary | ICD-10-CM | POA: Diagnosis not present

## 2024-01-15 DIAGNOSIS — J301 Allergic rhinitis due to pollen: Secondary | ICD-10-CM | POA: Diagnosis not present

## 2024-01-15 DIAGNOSIS — J3081 Allergic rhinitis due to animal (cat) (dog) hair and dander: Secondary | ICD-10-CM | POA: Diagnosis not present

## 2024-01-16 DIAGNOSIS — M79669 Pain in unspecified lower leg: Secondary | ICD-10-CM | POA: Diagnosis not present

## 2024-01-16 DIAGNOSIS — M545 Low back pain, unspecified: Secondary | ICD-10-CM | POA: Diagnosis not present

## 2024-01-22 DIAGNOSIS — M545 Low back pain, unspecified: Secondary | ICD-10-CM | POA: Diagnosis not present

## 2024-01-22 DIAGNOSIS — M79669 Pain in unspecified lower leg: Secondary | ICD-10-CM | POA: Diagnosis not present

## 2024-01-24 ENCOUNTER — Other Ambulatory Visit: Payer: Self-pay | Admitting: Cardiovascular Disease

## 2024-01-29 DIAGNOSIS — M79669 Pain in unspecified lower leg: Secondary | ICD-10-CM | POA: Diagnosis not present

## 2024-01-29 DIAGNOSIS — M545 Low back pain, unspecified: Secondary | ICD-10-CM | POA: Diagnosis not present

## 2024-01-31 DIAGNOSIS — M79669 Pain in unspecified lower leg: Secondary | ICD-10-CM | POA: Diagnosis not present

## 2024-01-31 DIAGNOSIS — M545 Low back pain, unspecified: Secondary | ICD-10-CM | POA: Diagnosis not present

## 2024-02-05 DIAGNOSIS — M79669 Pain in unspecified lower leg: Secondary | ICD-10-CM | POA: Diagnosis not present

## 2024-02-05 DIAGNOSIS — M545 Low back pain, unspecified: Secondary | ICD-10-CM | POA: Diagnosis not present

## 2024-02-05 DIAGNOSIS — J3081 Allergic rhinitis due to animal (cat) (dog) hair and dander: Secondary | ICD-10-CM | POA: Diagnosis not present

## 2024-02-05 DIAGNOSIS — J301 Allergic rhinitis due to pollen: Secondary | ICD-10-CM | POA: Diagnosis not present

## 2024-02-05 DIAGNOSIS — J3089 Other allergic rhinitis: Secondary | ICD-10-CM | POA: Diagnosis not present

## 2024-02-07 DIAGNOSIS — M79669 Pain in unspecified lower leg: Secondary | ICD-10-CM | POA: Diagnosis not present

## 2024-02-07 DIAGNOSIS — M545 Low back pain, unspecified: Secondary | ICD-10-CM | POA: Diagnosis not present

## 2024-02-12 DIAGNOSIS — M79669 Pain in unspecified lower leg: Secondary | ICD-10-CM | POA: Diagnosis not present

## 2024-02-12 DIAGNOSIS — M545 Low back pain, unspecified: Secondary | ICD-10-CM | POA: Diagnosis not present

## 2024-02-14 ENCOUNTER — Telehealth: Payer: Self-pay

## 2024-02-14 NOTE — Telephone Encounter (Signed)
 Spoke to daughter Arlin) regarding patient's appointment for Monday 02/18/24. I was going to reschedule appointment due to patient not being discharged from PT yet. Daughter stated she wants to keep appointment still. Patient thinks PT is not helping as much as she thought it would. She is almost done with her sessions. I told daughter they can keep the appointment.

## 2024-02-18 ENCOUNTER — Ambulatory Visit: Admitting: Physician Assistant

## 2024-02-26 ENCOUNTER — Ambulatory Visit: Admitting: Pulmonary Disease

## 2024-02-26 ENCOUNTER — Encounter: Payer: Self-pay | Admitting: Pulmonary Disease

## 2024-02-26 VITALS — BP 148/64 | HR 76 | Temp 97.6°F | Ht 63.0 in | Wt 123.0 lb

## 2024-02-26 DIAGNOSIS — I110 Hypertensive heart disease with heart failure: Secondary | ICD-10-CM | POA: Diagnosis not present

## 2024-02-26 DIAGNOSIS — I480 Paroxysmal atrial fibrillation: Secondary | ICD-10-CM | POA: Diagnosis not present

## 2024-02-26 DIAGNOSIS — G4736 Sleep related hypoventilation in conditions classified elsewhere: Secondary | ICD-10-CM

## 2024-02-26 DIAGNOSIS — J4530 Mild persistent asthma, uncomplicated: Secondary | ICD-10-CM

## 2024-02-26 DIAGNOSIS — I5032 Chronic diastolic (congestive) heart failure: Secondary | ICD-10-CM | POA: Diagnosis not present

## 2024-02-26 DIAGNOSIS — I340 Nonrheumatic mitral (valve) insufficiency: Secondary | ICD-10-CM

## 2024-02-26 DIAGNOSIS — I272 Pulmonary hypertension, unspecified: Secondary | ICD-10-CM

## 2024-02-26 MED ORDER — VENTOLIN HFA 108 (90 BASE) MCG/ACT IN AERS: 2.0000 | INHALATION_SPRAY | Freq: Four times a day (QID) | RESPIRATORY_TRACT | 3 refills | Status: AC | PRN

## 2024-02-26 MED ORDER — FLUTICASONE-SALMETEROL 100-50 MCG/ACT IN AEPB: 1.0000 | INHALATION_SPRAY | Freq: Two times a day (BID) | RESPIRATORY_TRACT | 11 refills | Status: AC

## 2024-02-26 NOTE — Progress Notes (Signed)
 "  Subjective:    Patient ID: [name], female    DOB: 08-Nov-1940, 83 y.o.   MRN: [id number]  Patient Care Team: Franchot Isaiah LABOR, MD as PCP - General (Family Medicine) Tamea Dedra CROME, MD as Consulting Physician (Pulmonary Disease) Bensimhon, Toribio SAUNDERS, MD as Consulting Physician (Cardiology)  Chief Complaint  Patient presents with   Asthma    No breathing problems. Using Wixela daily.     BACKGROUND/INTERVAL:[name] is an 83 year old lifelong never smoker, albeit with secondhand exposure in the past, and a history as noted below, who presents for follow-up of dyspnea.  Subsequent clinical evaluation was consistent with severe pulmonary hypertension which had previously been noted (cardiology at [facility]) and mitral valve regurgitation.  Patient had developed atrial fibrillation and this likely worsened her volume status issues.  Since that time she has been followed by cardiology with Dr. Gollan and by Dr. Dan Bensimhon for management of her pulmonary hypertension and congestive heart failure.  Her PFTs are a combination of restriction/obstruction she has a significant asthmatic component.  She had PFTs 09 April 2023.  Since her prior visit of 26 April 2023 she has not had any new issues.   HPI Discussed the use of AI scribe software for clinical note transcription with the patient, who gave verbal consent to proceed.  History of Present Illness   [name] is an 83 year old female with asthma who presents for a follow-up visit.  She presents with her daughter, [name].  She uses Wixela twice daily, in the morning and evening, and has not needed her albuterol  rescue inhaler recently. She requests a refill for the albuterol  inhaler to have it available if needed.  She mentions an interaction warning from her pharmacist regarding her medication amiodarone , which should not be taken within two hours of another unspecified medication. She takes half a pill of amiodarone   every other day and uses a chart to keep track of her medication schedule.  She inquires about the possibility of discontinuing nighttime oxygen  use.  She was reminded that with her significant pulmonary hypertension due to cardiac disease, and her issues with hypoxemia nocturnally it would be prudent to continue oxygen  therapy.  She has been reluctantly compliant.    DATA 12/14/2020 echocardiogram [facility]): LVEF over 55% VH, severe enlargement of the left and right atria.  Moderate to severe MR/TR, severe pulmonary hypertension. 01/26/2022 chest x-ray PA and lateral: Cardiomegaly, interstitial edema, small bilateral pleural effusions, perihilar bandlike atelectasis. 02/09/2022 echocardiogram: LVEF 60 to 65%, grade II DD, normal RV systolic function RV size normal.  Severely elevated pulmonary artery systolic pressure, RV systolic pressure estimated at 62.8 mm Hg, left atrial size moderately dilated, moderate MR, mild to moderate AR, no AS. 03/28/2022 PFTs: FEV1 1.21 L or 68% predicted FVC 1.35 L or 56% predicted, FEV1/FVC 90%.  There is significant bronchodilator response.  Mild to moderate restriction, moderate diffusion defect.  Combination of obstructive and restrictive physiology, obstructive physiology with bronchodilator response consistent with asthmatic response. 05/02/2022 CT chest high-resolution: No evidence of fibrotic interstitial lung disease, air trapping noted suggestive of small airways disease, few small bilateral pulmonary nodules less than or equal to 4 mm in diameter. 06/19/2022 sleep study: Overall RDI was 4.4 indicating no significant OSA.  Overall AHI 3.7.  SpO2 nadir was 82%.  Continued use of nocturnal oxygen  was recommended. 04/09/2023 PFTs: FEV1 1.52 L or 87% predicted, FVC 1.71 L or 72% predicted, no bronchodilator response.  Lung volumes moderately reduced.  Diffusion capacity moderately reduced however corrects to near normal by alveolar volume.    Review of Systems A 10  point review of systems was performed and it is as noted above otherwise negative.   Patient Active Problem List   Diagnosis Date Noted   Chronic respiratory failure with hypoxia (HCC) 05/12/2022   Asthma 05/12/2022   Congestive heart failure (HCC) 01/31/2022   Severe pulmonary hypertension (HCC) 01/31/2022   Carotid artery disease 12/03/2019   Moderate mitral insufficiency 09/24/2018   Atrial fibrillation (HCC) 08/14/2018   Mood disorder 06/21/2017   Advance directive discussed with patient 05/31/2016   Aortic insufficiency 06/08/2011   GERD 04/20/2009   RAYNAUD'S SYNDROME, HX OF 04/20/2009   HYPERLIPIDEMIA 12/14/2006   Essential hypertension 12/14/2006   Allergic rhinitis due to pollen 12/14/2006   SYMPTOM, INCONTINENCE, MIXED, URGE/STRESS 12/14/2006    Social History   Tobacco Use   Smoking status: Never    Passive exposure: Past   Smokeless tobacco: Never  Substance Use Topics   Alcohol use: Not Currently    Comment: special events    Allergies[1]  Active Medications[2]  Immunization History  Administered Date(s) Administered   Fluad Quad(high Dose 65+) 12/03/2020, 12/19/2021   Fluad Trivalent(High Dose 65+) 12/25/2022   INFLUENZA, HIGH DOSE SEASONAL PF 12/25/2012, 03/15/2021, 11/30/2023   Influenza Split 01/03/2011, 12/28/2011   Influenza Whole 12/14/2006, 01/07/2008, 12/16/2008, 12/15/2009   Influenza,inj,Quad PF,6+ Mos 11/28/2013, 12/04/2014, 12/08/2015, 11/27/2016, 11/30/2017, 12/05/2018, 11/29/2019   PFIZER(Purple Top)SARS-COV-2 Vaccination 03/19/2019, 04/12/2019, 06/24/2019, 12/22/2019   Pneumococcal Conjugate-13 05/28/2015   Pneumococcal Polysaccharide-23 05/01/2012, 03/15/2021, 05/01/2023   Td 04/20/2009        Objective:     Vitals:   02/26/24 1617  BP: (!) 148/64  Pulse: 76  Temp: 97.6 F (36.4 C)  Height: 5' 3 (1.6 m)  Weight: 123 lb (55.8 kg)  SpO2: 96%  TempSrc: Temporal  BMI (Calculated): 21.79     SpO2: 96 %  GENERAL: Patient  is a well-developed, well-nourished elderly woman in no acute respiratory distress no tachypnea. She is fully ambulatory, no conversational dyspnea. HEAD: Normocephalic, atraumatic.  EYES: Pupils equal, round, reactive to light.  No scleral icterus.  MOUTH: Caps and crowns present.  Oral mucosa moist.  No thrush. NECK: Supple. No thyromegaly. Trachea midline. no JVD.  No adenopathy. PULMONARY: Good air entry bilaterally.  No adventitious sounds. CARDIOVASCULAR: S1 and S2.  Currently in NSR with CVR.  Aortic regurg murmur 2/6, mitral regurgitation murmur 2/6 (stable).  ABDOMEN: Benign. MUSCULOSKELETAL: No joint deformity, no clubbing, no lower extremity edema.  NEUROLOGIC: Grossly nonfocal. SKIN: Intact,warm,dry. PSYCH: Mood and behavior normal.      Assessment & Plan:     ICD-10-CM   1. Mild persistent asthma without complication  J45.30     2. Severe pulmonary hypertension (HCC)  I27.20     3. Chronic diastolic congestive heart failure (HCC)  I50.32     4. Nocturnal hypoxemia due to pulmonary hypertension (HCC)  I27.29    G47.36     5. Moderate mitral insufficiency  I34.0     6. PAF (paroxysmal atrial fibrillation) (HCC)  I48.0      Meds ordered this encounter  Medications   albuterol  (VENTOLIN  HFA) 108 (90 Base) MCG/ACT inhaler    Sig: Inhale 2 puffs into the lungs every 6 (six) hours as needed for wheezing or shortness of breath.    Dispense:  18 g    Refill:  3   fluticasone -salmeterol (WIXELA INHUB) 100-50 MCG/ACT  AEPB    Sig: Inhale 1 puff into the lungs 2 (two) times daily.    Dispense:  60 each    Refill:  11   Discussion:    Asthma Well-controlled with no wheezing or shortness of breath. Current regimen includes Wixela twice daily and albuterol  as a rescue inhaler, though albuterol  has not been used recently. Plan to decrease Wixela strength due to well-controlled asthma and potential cardiac benefits. Discussed potential symptoms to monitor, including wheezing  and increased shortness of breath, which would necessitate returning to the regular strength. - Decreased Wixela strength to a lower dose (100). - Refilled albuterol  inhaler for rescue use. - Instructed to monitor for symptoms such as wheezing or increased shortness of breath and report if she occurs.  Pulmonary hypertension Managed with nighttime oxygen , which is protective for the heart. No current wheezing or signs of asthma exacerbation. Discussed the importance of continuing nighttime oxygen  for cardiac protection. - Continue nighttime oxygen  therapy.     Follow-up will be in 6 months time from the asthma standpoint.  Advised if symptoms do not improve or worsen, to please contact office for sooner follow up or seek emergency care.    I spent 30 minutes of dedicated to the care of this patient on the date of this encounter to include pre-visit review of records, face-to-face time with the patient discussing conditions above, post visit ordering of testing, clinical documentation with the electronic health record, making appropriate referrals as documented, and communicating necessary findings to members of the patients care team.     C. Leita Sanders, MD Advanced Bronchoscopy PCCM [facility]    *This note was generated using voice recognition software/Dragon and/or AI transcription program.  Despite best efforts to proofread, errors can occur which can change the meaning. Any transcriptional errors that result from this process are unintentional and may not be fully corrected at the time of dictation.     [1]  Allergies Allergen Reactions   Amlodipine  Besy-Benazepril Hcl Swelling    Lotrel caused swelling of feet & facial swelling unilaterally   Amlodipine  Besy-Benazepril Hcl Other (See Comments)   Lotrel [Amlodipine  Besy-Benazepril Hcl]    Molds & Smuts Other (See Comments)   Wasp Venom Swelling    Allergic to wasps   Jardiance  [Empagliflozin ] Rash  [2]   Current Meds  Medication Sig   albuterol  (VENTOLIN  HFA) 108 (90 Base) MCG/ACT inhaler Inhale 2 puffs into the lungs every 6 (six) hours as needed for wheezing or shortness of breath.   amiodarone  (PACERONE ) 200 MG tablet Take 0.5 tablets (100 mg total) by mouth every other day.   amLODipine  (NORVASC ) 5 MG tablet Take 1 tablet (5 mg total) by mouth daily.   ELIQUIS  2.5 MG TABS tablet TAKE 1 TABLET BY MOUTH TWICE A DAY   EPINEPHrine 0.3 mg/0.3 mL IJ SOAJ injection Inject 0.3 mg into the muscle as needed for anaphylaxis.   famotidine  (PEPCID ) 20 MG tablet Take 20 mg by mouth daily. (Patient taking differently: Take 20 mg by mouth daily. PRN)   FARXIGA  10 MG TABS tablet TAKE 1 TABLET BY MOUTH DAILY BEFORE BREAKFAST.   fexofenadine (ALLEGRA) 180 MG tablet Take 180 mg by mouth daily.   fluticasone -salmeterol (WIXELA INHUB) 100-50 MCG/ACT AEPB Inhale 1 puff into the lungs 2 (two) times daily.   furosemide  (LASIX ) 20 MG tablet Take 20 mg by mouth daily.   glucosamine-chondroitin 500-400 MG tablet Take 1 tablet by mouth 2 (two) times daily. 1 pm  Krill Oil 500 MG CAPS Take by mouth.   mometasone  (NASONEX ) 50 MCG/ACT nasal spray PLACE 1 SPRAY INTO THE NOSE 2 (TWO) TIMES DAILY.   Multiple Vitamin (MULTIVITAMIN) capsule Take 1 capsule by mouth daily.   Multiple Vitamins-Minerals (PRESERVISION AREDS 2 PO) Take by mouth.   OXYGEN  Inhale into the lungs. 1 liter at bedtime   Polyethyl Glycol-Propyl Glycol (LUBRICATING EYE DROPS OP) Apply to eye. Twice a day drops   potassium chloride  SA (KLOR-CON  M) 20 MEQ tablet TAKE 1 TABLET (20 MEQ) TWICE EVERY OTHER DAY ALTERNATING WITH 1 TABLET (20 MEQ) ONCE EVERY OTHER DAY   pravastatin  (PRAVACHOL ) 40 MG tablet TAKE 1 TABLET BY MOUTH EVERYDAY AT BEDTIME   prednisoLONE acetate (PRED FORTE) 1 % ophthalmic suspension Apply to eye.   sertraline  (ZOLOFT ) 25 MG tablet Take 1 tablet (25 mg total) by mouth daily.   sodium chloride  (MURO 128) 5 % ophthalmic solution Place 1  drop into the left eye 2 (two) times daily.   valsartan  (DIOVAN ) 160 MG tablet Take 1 tablet (160 mg total) by mouth 2 (two) times daily.   vitamin B-12 (CYANOCOBALAMIN) 500 MCG tablet Take 500 mcg by mouth daily.   [DISCONTINUED] albuterol  (VENTOLIN  HFA) 108 (90 Base) MCG/ACT inhaler Inhale 1 puff into the lungs every 6 (six) hours as needed.   [DISCONTINUED] WIXELA INHUB 250-50 MCG/ACT AEPB Inhale 1 puff into the lungs 2 (two) times daily.   "

## 2024-02-26 NOTE — Patient Instructions (Signed)
 VISIT SUMMARY:  Today, we discussed your asthma management and pulmonary hypertension. Your asthma is well-controlled, and we made some adjustments to your medication. We also talked about the importance of continuing your nighttime oxygen  therapy.  YOUR PLAN:  -ASTHMA: Asthma is a condition where your airways narrow and swell, which can make breathing difficult. Your asthma is well-controlled, so we have decreased the strength of your Wixela medication. You should continue using Wixela twice daily and have your albuterol  inhaler available for rescue use. Please monitor for any symptoms like wheezing or increased shortness of breath and let us  know if they occur.  -PULMONARY HYPERTENSION: Pulmonary hypertension is high blood pressure in the arteries of your lungs, which can affect your heart. It is important to continue using your nighttime oxygen  therapy as it helps protect your heart. We did not find any signs of asthma exacerbation today, so please continue with your current oxygen  therapy.  INSTRUCTIONS:  Please continue using your Wixela twice daily and have your albuterol  inhaler available for rescue use. Monitor for any symptoms like wheezing or increased shortness of breath and let us  know if they occur. Continue using your nighttime oxygen  therapy as it helps protect your heart.

## 2024-03-01 ENCOUNTER — Encounter: Payer: Self-pay | Admitting: Pulmonary Disease

## 2024-03-02 ENCOUNTER — Other Ambulatory Visit: Payer: Self-pay | Admitting: Pulmonary Disease

## 2024-03-18 ENCOUNTER — Encounter: Payer: Self-pay | Admitting: Physician Assistant

## 2024-03-18 ENCOUNTER — Ambulatory Visit: Admitting: Physician Assistant

## 2024-03-18 VITALS — BP 168/62 | Ht 63.0 in | Wt 122.1 lb

## 2024-03-18 DIAGNOSIS — M5416 Radiculopathy, lumbar region: Secondary | ICD-10-CM | POA: Diagnosis not present

## 2024-03-18 NOTE — Progress Notes (Unsigned)
 "  Referring Physician:  Franchot Isaiah LABOR, MD [address] [place],  [place] [address]  Primary Physician:  Franchot Isaiah LABOR, MD  History of Present Illness:  [name] is here today with a chief complaint of new, severe left low back and buttock pain x 1 day.  It extends into the back of her left leg into the bottom of her foot.  It improves when she is sitting, but is constant.  She does not acknowledge an inciting event but says that a week ago she was moving boxes and furniture around the house.  She has noticed that she cannot put pressure on her left leg and needs to sit down.  Her gait has been affected significantly.  Denies any saddle anesthesia or new incontinence.  01/09/24 Patient was seen in follow-up today for left lumbar radiculopathy.  Her pain has improved since she was last seen and it is no longer going down her leg.  At times she will have some achiness and spasms in her foot and outside of her calf, but not nearly as bad as it was when she was last seen.  She does periodically get pins-and-needles to the top of her foot.  She is using IcyHot on her leg and back which has helped.  No new weakness.  03/19/23  She is doing well, no more severe pain, just still across back worse with activity. Off and on pinching in left toes.did PT- did help. Left calf pain off and on    Not been back to the gymn  Weakness: none Bowel/Bladder Dysfunction: none  Conservative measures:  Physical therapy:  has not participated in Multimodal medical therapy including regular antiinflammatories: Tylenol   Injections: no epidural steroid injections  Past Surgery: no spine surgery  AmeLie Hollars has no symptoms of cervical myelopathy.  The symptoms are causing a significant impact on the patient's life.   Review of Systems:  A 10 point review of systems is negative, except for the pertinent positives and negatives detailed in the HPI.  Past Medical  History: Past Medical History:  Diagnosis Date   Adenomatous colon polyp 2005   Allergy    perennial ; shots from Dr Cloretta   Aortic insufficiency    Hyperlipidemia    Hypertension    Murmur    a.  04/2011 Echo:  EF 55-60%, mild-mod ai/mr   Squamous cell carcinoma of skin 08/05/2020   left pretibia EDC   Uterine cancer (HCC)     Past Surgical History: Past Surgical History:  Procedure Laterality Date   BLADDER SUSPENSION     CARPAL TUNNEL RELEASE Left    COLONOSCOPY  multiple   GLAUCOMA SURGERY     for narrow lines (? narrow angle?)   INTRAOCULAR LENS INSERTION Bilateral 10/22/2014; 11/26/2014   TOTAL ABDOMINAL HYSTERECTOMY W/ BILATERAL SALPINGOOPHORECTOMY     abnormal PAP    Allergies: Allergies as of 03/18/2024 - Review Complete 03/01/2024  Allergen Reaction Noted   Amlodipine  besy-benazepril hcl Swelling 05/08/2011   Amlodipine  besy-benazepril hcl Other (See Comments) 02/06/2022   Lotrel [amlodipine  besy-benazepril hcl]  08/25/2016   Molds & smuts Other (See Comments) 04/22/2018   Wasp venom Swelling 05/28/2015   Jardiance  [empagliflozin ] Rash 12/20/2023    Medications: Outpatient Encounter Medications as of 03/18/2024  Medication Sig   albuterol  (VENTOLIN  HFA) 108 (90 Base) MCG/ACT inhaler Inhale 2 puffs into the lungs every 6 (six) hours as needed for wheezing or shortness of breath.   amiodarone  (  PACERONE ) 200 MG tablet Take 0.5 tablets (100 mg total) by mouth every other day.   amLODipine  (NORVASC ) 5 MG tablet Take 1 tablet (5 mg total) by mouth daily.   ELIQUIS  2.5 MG TABS tablet TAKE 1 TABLET BY MOUTH TWICE A DAY   EPINEPHrine 0.3 mg/0.3 mL IJ SOAJ injection Inject 0.3 mg into the muscle as needed for anaphylaxis.   estradiol (ESTRACE) 0.01 % CREA vaginal cream Place 1 Applicatorful vaginally every 30 (thirty) days.   famotidine  (PEPCID ) 20 MG tablet Take 20 mg by mouth daily. (Patient taking differently: Take 20 mg by mouth daily. PRN)   FARXIGA  10 MG TABS  tablet TAKE 1 TABLET BY MOUTH DAILY BEFORE BREAKFAST.   fexofenadine (ALLEGRA) 180 MG tablet Take 180 mg by mouth daily.   fluticasone -salmeterol (WIXELA INHUB) 100-50 MCG/ACT AEPB Inhale 1 puff into the lungs 2 (two) times daily.   furosemide  (LASIX ) 20 MG tablet Take 20 mg by mouth daily.   glucosamine-chondroitin 500-400 MG tablet Take 1 tablet by mouth 2 (two) times daily. 1 pm   Krill Oil 500 MG CAPS Take by mouth.   mometasone  (NASONEX ) 50 MCG/ACT nasal spray PLACE 1 SPRAY INTO THE NOSE 2 (TWO) TIMES DAILY.   Multiple Vitamin (MULTIVITAMIN) capsule Take 1 capsule by mouth daily.   Multiple Vitamins-Minerals (PRESERVISION AREDS 2 PO) Take by mouth.   OXYGEN  Inhale into the lungs. 1 liter at bedtime   Polyethyl Glycol-Propyl Glycol (LUBRICATING EYE DROPS OP) Apply to eye. Twice a day drops   potassium chloride  SA (KLOR-CON  M) 20 MEQ tablet TAKE 1 TABLET (20 MEQ) TWICE EVERY OTHER DAY ALTERNATING WITH 1 TABLET (20 MEQ) ONCE EVERY OTHER DAY   pravastatin  (PRAVACHOL ) 40 MG tablet TAKE 1 TABLET BY MOUTH EVERYDAY AT BEDTIME   prednisoLONE acetate (PRED FORTE) 1 % ophthalmic suspension Apply to eye.   sertraline  (ZOLOFT ) 25 MG tablet Take 1 tablet (25 mg total) by mouth daily.   sodium chloride  (MURO 128) 5 % ophthalmic solution Place 1 drop into the left eye 2 (two) times daily.   valsartan  (DIOVAN ) 160 MG tablet Take 1 tablet (160 mg total) by mouth 2 (two) times daily.   vitamin B-12 (CYANOCOBALAMIN) 500 MCG tablet Take 500 mcg by mouth daily.   No facility-administered encounter medications on file as of 03/18/2024.    Social History: Social History   Tobacco Use   Smoking status: Never    Passive exposure: Past   Smokeless tobacco: Never  Vaping Use   Vaping status: Never Used  Substance Use Topics   Alcohol use: Not Currently    Comment: special events   Drug use: No    Family Medical History: Family History  Problem Relation Age of Onset   Stroke Mother 79   Heart failure  Mother    Heart attack Father 17   Lung cancer Father    Cancer Maternal Aunt         X 2; Gyn & ? primary   Diabetes Maternal Grandmother    Esophageal cancer Neg Hx    Colon cancer Neg Hx    Stomach cancer Neg Hx    Rectal cancer Neg Hx     Physical Examination: @VITALWITHPAIN @  General: Patient is well developed, well nourished, calm, collected, and in no apparent distress. Attention to examination is appropriate.  Psychiatric: Patient is non-anxious.  Head:  Pupils equal, round, and reactive to light.  ENT:  Oral mucosa appears well hydrated.  Neck:   Supple.  Respiratory: Patient is breathing without any difficulty.  Extremities: No edema.  Vascular: Palpable dorsal pedal pulses.  Skin:   On exposed skin, there are no abnormal skin lesions.  NEUROLOGICAL:     Awake, alert, oriented to person, place, and time.  Speech is clear and fluent. Fund of knowledge is appropriate.   Cranial Nerves: Pupils equal round and reactive to light.  Facial tone is symmetric.   ROM of spine: Minimal tenderness to palpation of left lumbar paraspinals    Strength:  Side Iliopsoas Quads Hamstring PF DF EHL  R 5 5 5 5 5 5   L 4 5 5 5  4- 5   Positive straight leg raise on the left Reflexes are 3+ in bilateral patella, absent left Achilles.  1+ right Achilles. Clonus is not present.  Toes are down-going.  Bilateral upper and lower extremity sensation is intact to light touch.    Gait is impeded secondary to pain.  Medical Decision Making  Imaging: EXAM: MRI LUMBAR SPINE 12/29/2023 04:25:57 PM   TECHNIQUE: Multiplanar multisequence MRI of the lumbar spine was performed without the administration of intravenous contrast.   COMPARISON: None available.   CLINICAL HISTORY: Acute lumbar radiculopathy, weakness, history of gait and Uterine cancer, NKI.   FINDINGS:   BONES AND ALIGNMENT: There is mild rotatory levoscoliosis of the lumbar spine. Normal vertebral  body heights. The vertebral bodies are heterogeneous in signal intensity. There are no suspicious lesions present to suggest metastatic disease. There are discogenic reactive changes present within the endplates throughout the lumbar spine.   SPINAL CORD: The conus medullaris terminates at T12-L1.   SOFT TISSUES: No paraspinal mass.   L1-L2: There is mild disc space narrowing, but no disc herniation. The spinal canal and neural foramina are widely patent.   L2-L3: There is diffuse disc bulging and a concentric annular tear. There is mild central spinal canal stenosis and mild-to-moderate bilateral lateral recess stenosis, but no apparent nerve root impingement.   L3-L4: There is a broad-based disc bulge present, which is eccentric to the right causing mild-to-moderate central spinal canal stenosis and moderate right lateral recess and neural foraminal stenosis. There is impingement of the right L4 nerve in the lateral recess. There is mild-to-moderate left lateral recess stenosis, but no left nerve root impingement evident.   L4-L5: There is diffuse disc bulging and bilateral facet arthrosis with mild-to-moderate central spinal canal stenosis and mild-to-moderate bilateral lateral recess stenosis. There is questionable impingement of the L5 nerves in the lateral recesses.   L5-S1: There is diffuse disc bulging and bilateral facet arthrosis with mild central spinal canal stenosis and bilateral neural foraminal stenosis. No apparent nerve root impingement.   GALLBLADDER: The stone is incidentally seen layering independently within the gallbladder.     IMPRESSION: 1. L3-4: Broad-based disc bulge eccentric to the right causing mild-to-moderate central canal stenosis, moderate right lateral recess and right neural foraminal stenosis with impingement of the right L4 nerve; mild-to-moderate left lateral recess stenosis without left nerve root impingement. 2. L4-5: Diffuse  disc bulge and bilateral facet arthrosis causing mild-to-moderate central canal stenosis and mild-to-moderate bilateral lateral recess stenosis; questionable impingement of the L5 nerves in the lateral recesses.    Assessment and Plan: Ms. Dado is a pleasant 84 y.o. female comes in today for follow-up for left lumbar radiculopathy.  This is largely in the L4-5 distribution.  Patient does also have disc bulge at L3-4.  She had a great result with the steroids that she was  using for pain.  She is doing much better than her initial visit.  Plan includes the following moving forward:   - Physical therapy - Discussed with patient the possibility of injections.  Will hold off for now since she is improving substantially.  Happy to put in a referral for this whenever she would like. - See back in 6 weeks.   Thank you for involving me in the care of this patient.   Lyle Decamp, PA-C Dept. of Neurosurgery  "

## 2024-03-25 ENCOUNTER — Ambulatory Visit

## 2024-04-09 ENCOUNTER — Encounter: Admitting: Nurse Practitioner

## 2024-05-07 ENCOUNTER — Ambulatory Visit: Admitting: Dermatology

## 2024-08-13 ENCOUNTER — Ambulatory Visit

## 2024-08-26 ENCOUNTER — Ambulatory Visit: Admitting: Pulmonary Disease
